# Patient Record
Sex: Female | Born: 1960 | Race: Black or African American | Hispanic: No | Marital: Single | Smoking: Current every day smoker
Health system: Southern US, Community
[De-identification: ages and names within clinical notes are randomized; demographics above are authoritative.]

## PROBLEM LIST (undated history)

## (undated) DIAGNOSIS — K56609 Unspecified intestinal obstruction, unspecified as to partial versus complete obstruction: Secondary | ICD-10-CM

## (undated) DIAGNOSIS — J449 Chronic obstructive pulmonary disease, unspecified: Secondary | ICD-10-CM

## (undated) DIAGNOSIS — F329 Major depressive disorder, single episode, unspecified: Secondary | ICD-10-CM

## (undated) DIAGNOSIS — M1990 Unspecified osteoarthritis, unspecified site: Secondary | ICD-10-CM

## (undated) DIAGNOSIS — G8929 Other chronic pain: Secondary | ICD-10-CM

## (undated) DIAGNOSIS — I10 Essential (primary) hypertension: Secondary | ICD-10-CM

## (undated) DIAGNOSIS — K219 Gastro-esophageal reflux disease without esophagitis: Secondary | ICD-10-CM

## (undated) DIAGNOSIS — F419 Anxiety disorder, unspecified: Secondary | ICD-10-CM

## (undated) DIAGNOSIS — F32A Depression, unspecified: Secondary | ICD-10-CM

## (undated) DIAGNOSIS — I639 Cerebral infarction, unspecified: Secondary | ICD-10-CM

## (undated) HISTORY — DX: Unspecified intestinal obstruction, unspecified as to partial versus complete obstruction: K56.609

## (undated) HISTORY — PX: OTHER SURGICAL HISTORY: SHX169

## (undated) HISTORY — DX: Gastro-esophageal reflux disease without esophagitis: K21.9

## (undated) HISTORY — DX: Cerebral infarction, unspecified: I63.9

---

## 2000-11-25 ENCOUNTER — Encounter: Payer: Self-pay | Admitting: Emergency Medicine

## 2000-11-25 ENCOUNTER — Emergency Department (HOSPITAL_COMMUNITY): Admission: EM | Admit: 2000-11-25 | Discharge: 2000-11-25 | Payer: Self-pay | Admitting: Emergency Medicine

## 2001-08-18 HISTORY — PX: ABDOMINAL SURGERY: SHX537

## 2002-10-27 ENCOUNTER — Observation Stay (HOSPITAL_COMMUNITY): Admission: EM | Admit: 2002-10-27 | Discharge: 2002-10-27 | Payer: Self-pay | Admitting: *Deleted

## 2003-02-16 ENCOUNTER — Inpatient Hospital Stay (HOSPITAL_COMMUNITY): Admission: AD | Admit: 2003-02-16 | Discharge: 2003-02-19 | Payer: Self-pay | Admitting: Obstetrics and Gynecology

## 2003-03-27 ENCOUNTER — Ambulatory Visit (HOSPITAL_COMMUNITY): Admission: RE | Admit: 2003-03-27 | Discharge: 2003-03-27 | Payer: Self-pay | Admitting: Obstetrics and Gynecology

## 2006-02-15 ENCOUNTER — Emergency Department (HOSPITAL_COMMUNITY): Admission: EM | Admit: 2006-02-15 | Discharge: 2006-02-15 | Payer: Self-pay | Admitting: Emergency Medicine

## 2006-05-12 ENCOUNTER — Encounter: Admission: RE | Admit: 2006-05-12 | Discharge: 2006-05-12 | Payer: Self-pay | Admitting: Occupational Medicine

## 2006-09-23 ENCOUNTER — Ambulatory Visit (HOSPITAL_COMMUNITY): Admission: RE | Admit: 2006-09-23 | Discharge: 2006-09-23 | Payer: Self-pay | Admitting: Obstetrics & Gynecology

## 2006-10-07 ENCOUNTER — Ambulatory Visit (HOSPITAL_COMMUNITY): Admission: RE | Admit: 2006-10-07 | Discharge: 2006-10-07 | Payer: Self-pay | Admitting: Obstetrics & Gynecology

## 2006-10-07 ENCOUNTER — Encounter (INDEPENDENT_AMBULATORY_CARE_PROVIDER_SITE_OTHER): Payer: Self-pay | Admitting: Specialist

## 2006-11-14 ENCOUNTER — Emergency Department (HOSPITAL_COMMUNITY): Admission: EM | Admit: 2006-11-14 | Discharge: 2006-11-14 | Payer: Self-pay | Admitting: Emergency Medicine

## 2006-11-24 ENCOUNTER — Encounter (HOSPITAL_COMMUNITY): Admission: RE | Admit: 2006-11-24 | Discharge: 2006-12-24 | Payer: Self-pay | Admitting: Orthopaedic Surgery

## 2008-05-29 ENCOUNTER — Emergency Department (HOSPITAL_COMMUNITY): Admission: EM | Admit: 2008-05-29 | Discharge: 2008-05-29 | Payer: Self-pay | Admitting: Emergency Medicine

## 2010-09-09 ENCOUNTER — Encounter: Payer: Self-pay | Admitting: Interventional Radiology

## 2010-10-24 ENCOUNTER — Emergency Department (HOSPITAL_COMMUNITY): Payer: Self-pay

## 2010-10-24 ENCOUNTER — Emergency Department (HOSPITAL_COMMUNITY)
Admission: EM | Admit: 2010-10-24 | Discharge: 2010-10-24 | Disposition: A | Discharge status: Home / Self Care | Payer: Self-pay | Attending: Emergency Medicine | Admitting: Emergency Medicine

## 2010-10-24 DIAGNOSIS — R05 Cough: Secondary | ICD-10-CM | POA: Insufficient documentation

## 2010-10-24 DIAGNOSIS — R0602 Shortness of breath: Secondary | ICD-10-CM | POA: Insufficient documentation

## 2010-10-24 DIAGNOSIS — I10 Essential (primary) hypertension: Secondary | ICD-10-CM | POA: Insufficient documentation

## 2010-10-24 DIAGNOSIS — R0789 Other chest pain: Secondary | ICD-10-CM | POA: Insufficient documentation

## 2010-10-24 DIAGNOSIS — R059 Cough, unspecified: Secondary | ICD-10-CM | POA: Insufficient documentation

## 2010-10-24 DIAGNOSIS — J45901 Unspecified asthma with (acute) exacerbation: Secondary | ICD-10-CM | POA: Insufficient documentation

## 2010-10-24 DIAGNOSIS — F17200 Nicotine dependence, unspecified, uncomplicated: Secondary | ICD-10-CM | POA: Insufficient documentation

## 2010-11-07 ENCOUNTER — Other Ambulatory Visit (HOSPITAL_COMMUNITY): Payer: Self-pay | Admitting: Family Medicine

## 2010-11-07 DIAGNOSIS — Z139 Encounter for screening, unspecified: Secondary | ICD-10-CM

## 2010-11-12 ENCOUNTER — Ambulatory Visit (HOSPITAL_COMMUNITY)
Admission: RE | Admit: 2010-11-12 | Discharge: 2010-11-12 | Disposition: A | Discharge status: Home / Self Care | Payer: PRIVATE HEALTH INSURANCE | Source: Ambulatory Visit | Attending: Family Medicine | Admitting: Family Medicine

## 2010-11-12 DIAGNOSIS — Z139 Encounter for screening, unspecified: Secondary | ICD-10-CM

## 2010-11-12 DIAGNOSIS — Z1231 Encounter for screening mammogram for malignant neoplasm of breast: Secondary | ICD-10-CM | POA: Insufficient documentation

## 2011-01-03 NOTE — Op Note (Signed)
   NAME:  Judy Boyd, Judy Boyd                       ACCOUNT NO.:  [id number]   MEDICAL RECORD NO.:  [id number]                   PATIENT TYPE:  INP   LOCATION:  A427                                 FACILITY:  [facility]   PHYSICIAN:  Tilda Burrow, M.D.              DATE OF BIRTH:  1960-09-03   DATE OF PROCEDURE:  DATE OF DISCHARGE:                                 OPERATIVE REPORT   DELIVERY NOTE:  [name] desired something for pain and complained of pressure  at approximately 8:20, where she was noted to be fully dilated at +2  station.  Pain medicine was deferred and she was instructed to push.  After  a very brief second stage, she delivered a viable female infant at 12.  The baby was expelled through one contraction, so there was no time for  suctioning.  Apgars are 9 and 9, weight 3 pounds 14 ounces.  Twenty units of  Pitocin diluted in 1000 mL of lactated Ringer's was then infused rapidly IV.  The placenta separated spontaneously and was delivered via controlled cord  traction and maternal pushing effort at approximately 0835.  The fundus was  immediately firm, and there was little blood loss noted.  A first degree  perineal laceration was noted and was not repaired.  Estimated blood loss  250 mL.     [name], C.N.M.          Tilda Burrow, M.D.    FC/MEDQ  D:  02/17/2003  T:  02/18/2003  Job:  [id number]

## 2011-01-03 NOTE — H&P (Signed)
NAME:  Judy Boyd, Judy Boyd                       ACCOUNT NO.:  [id number]   MEDICAL RECORD NO.:  [id number]                   PATIENT TYPE:  OIB   LOCATION:  LDR1                                 FACILITY:  [facility]   PHYSICIAN:  Tilda Burrow, M.D.              DATE OF BIRTH:  Mar 25, 1961   DATE OF ADMISSION:  02/16/2003  DATE OF DISCHARGE:                                HISTORY & PHYSICAL   CHIEF COMPLAINT:  Spontaneous rupture of membranes at 1900.   HISTORY OF PRESENT ILLNESS:  Judy Boyd is a 50 year old gravida 3, para 1, AB  1, living 1 with an EDC of 03/27/2003 based on known last menstrual period and  correlating first and second trimester ultrasounds, placing her at 34-3/[redacted]  weeks gestation.  She began prenatal care early in the first trimester and  has had regular visits.  Prenatal course has been uneventful.  Total weight  gain approximately 15 pounds with appropriate fundal height growth.  Prenatal blood pressure are 110s to 130s/70s to 80s.   PRENATAL LABORATORY DATA:  Blood type O positive, rubella immune.  HBsAg,  HIV, RPR, gonorrhea, Chlamydia all negative.  One-hour GTT was normal at 67.  She declined an amniocentesis, but the MS ASP was normal.   PAST MEDICAL HISTORY:  Noncontributory.   PAST SURGICAL HISTORY:  She had an ectopic pregnancy about 1992 and a bowel  obstruction approximately 52.  No history of blood transfusions or  anesthesia complications.   ALLERGIES:  No known drug allergies.   SOCIAL HISTORY:  She is single.  The father of the baby and her family are  supportive.  She smokes two to three cigarettes a day, denies alcohol and  drug use.   PAST OBSTETRICAL HISTORY:  In 1989, she had a female 6 pound 5 ounce at  approximately 40 weeks for which she said was a 45-minute labor.  In 1992,  the ectopic as described before.   PHYSICAL EXAMINATION:  HEENT:  Within normal limits.  HEART:  Regular.  LUNGS:  Clear.  ABDOMEN:  Soft and nontender.  There are  no contractions at this time.  PELVIC:  Cervix is 3, 70%, -2, very posterior, vertex per RN.  Gross rupture  of membranes with clear amniotic fluid.  EXTREMITIES:  Legs negative.   IMPRESSION:  1. Intrauterine pregnancy at 34-3/[redacted] weeks gestation.  2. Preterm premature rupture of membranes.   PLAN:  Plan of care was discussed with both Dr. Emelda Fear and the attending  pediatrician, Dr. Milford Cage.  We will provide GBS prophylaxis until delivery.  If  patient is not in labor by the morning, we will begin Pitocin augmentation.      [name], C.N.M.          Tilda Burrow, M.D.    FC/MEDQ  D:  02/16/2003  T:  02/16/2003  Job:  [id number]   cc:   [facility]

## 2011-01-03 NOTE — H&P (Signed)
   NAME:  Judy Boyd, Judy Boyd                       ACCOUNT NO.:  [id number]   MEDICAL RECORD NO.:  [id number]                   PATIENT TYPE:  AMB   LOCATION:  DAY                                  FACILITY:  [facility]   PHYSICIAN:  Tilda Burrow, M.D.              DATE OF BIRTH:  Jul 08, 1961   DATE OF ADMISSION:  DATE OF DISCHARGE:                                HISTORY & PHYSICAL   ADMISSION DIAGNOSIS:  Desire for elective permanent sterilization.   HISTORY OF PRESENT ILLNESS:  This 50 year old female, gravida 3, para 2, AB1  with two living children, most recently status post delivery at 34-1/[redacted] weeks  gestation of a small for gestational age infant.  She is now admitted one  month later for elective permanent sterilization.  She understands the  permanency of the requested procedure with failure rate 1:100 quoted the  patient.  She was seen for postpartum visit on March 01, 2003 and confirms  again her desire for permanent sterilization.  She has previously signed  Medicaid consents on March 08, 2003.  Plans are to proceed with sterilization  as soon as possible.   PAST MEDICAL HISTORY:  Benign.   PAST SURGICAL HISTORY:  Laparoscopic for ectopic in 1992, bowel obstruction,  Dr. Malvin Johns, 1990.   SOCIAL HISTORY/HABITS:  Cigarettes two to three per day.  Alcohol,  recreational drugs denied.  The patient is single, allegedly supportive  father though he is not present during the birth of the child or subsequent  care.  The patient at times shows limited parenting interests.   PHYSICAL EXAMINATION:  VITAL SIGNS:  Height 5 feet 6 inches, weight 135.  Blood pressure 126/62.  Hemoglobin 13.3.  HEENT:  Pupils equal, round, reactive to light.  NECK:  Supple.  Trachea midline.  LUNGS:  Clear to auscultation.  HEART:  Regular rate and rhythm without murmurs.  GENITOURINARY:  External genitalia normal.  Vaginal examination with normal  secretions.  Cervix anteflexed, involute, uterus  involuting nicely.  Adnexa  negative for masses.  First degree perineal laceration well healed.   PLAN:  Laparoscopic tubal sterilization Falope rings March 09, 2003.  Patient  is aware that tubal cautery is at times  necessary in early postpartum  sterilizations.                                               Tilda Burrow, M.D.    JVF/MEDQ  D:  03/07/2003  T:  03/07/2003  Job:  [id number]

## 2011-01-03 NOTE — Op Note (Signed)
NAME:  Judy Boyd, Judy Boyd                       ACCOUNT NO.:  [id number]   MEDICAL RECORD NO.:  [id number]                   PATIENT TYPE:  AMB   LOCATION:  DAY                                  FACILITY:  [facility]   PHYSICIAN:  Tilda Burrow, M.D.              DATE OF BIRTH:  07-12-1961   DATE OF PROCEDURE:  DATE OF DISCHARGE:                                 OPERATIVE REPORT   PREOPERATIVE DIAGNOSIS:  Elective sterilization.   POSTOPERATIVE DIAGNOSIS:  Elective sterilization, pelvis adhesions.   PROCEDURE:  Laparoscopic left tubal cautery, laparoscopic right Falope ring  application, lysis of adhesions.   SURGEON:  Tilda Burrow, M.D.   ANESTHESIA:  General.   COMPLICATIONS:  None.   FINDINGS:  Thin filmy adhesions from omentum to anterior abdominal wall from  below the umbilicus to the pelvic brim, partially view but allowing adequate  view to continue the procedure laparoscopically.   DESCRIPTION OF PROCEDURE:  The patient was taken to the operating room and  prepped and draped for combined abdominovaginal procedure with in-and-out  catheterization of the bladder, Hulka thyroid tenaculum attached to the  cervix, and abdomen prepped and draped.   A supraumbilical 1-cm skin incision was made and Veress needle used to  introduce pneumoperitoneum into the abdomen.  Due to the prior midline  vertical incision for lysis of adhesions, we decided to use an open Hasson  cannula technique of peritoneal opening, so we proceeded with extending the  suprapubic incision to 2-cm, grasping the fascia with Allis clamps, and  carefully entering the peritoneal cavity bluntly with placement of a Hasson  laparoscopic 10-mm cannula and insertion of the camera.  There were lots of  thin filmy adhesions, but we could wind our way through them to visualize  the pelvis.  A suprapubic trocar was inserted under direct visualization,  first using the #8 laparoscopic trocar for the Falope ring  applier.  There  was a technical malfunction, so it was replaced with a 5-mm trocar through  which the surgical scissors were used to dissect enough of the adhesions to  give [place] adequate visualization.  We also proceeded to do a unipolar cautery  closure of the left tube, with unipolar cautery dessication of a 2-cm  portion of the left tube followed by scissor transection of the tube through  the midportion of the desiccated area.  Hemostasis was confirmed.  We then  proceeded to the opposite side which was more technically challenging due to  adhesion visibility.  We decided to use the Falope ring applier rather than  attempt cautery in this area so the Falope ring could be applied to the  midportion of the tube in standard fashion.  The tube was infiltrated with a  dilute Marcaine solution as per protocol, and then we proceeded with closure  of the procedure  by closure of the fascia using interrupted 0 Vicryl after irrigation of  the  abdomen with generous saline solution.  We then proceeded to close the  subcutaneous tissues with 2-0 plain and staple closure of the skin.   The patient tolerated the procedure well and went to the recovery room in  good condition.                                               Tilda Burrow, M.D.    JVF/MEDQ  D:  03/27/2003  T:  03/27/2003  Job:  [id number]

## 2011-01-03 NOTE — Op Note (Signed)
Judy Boyd, Judy Boyd             ACCOUNT NO.:  [id number]   MEDICAL RECORD NO.:  [id number]          PATIENT TYPE:  AMB   LOCATION:  DAY                           FACILITY:  [facility]   PHYSICIAN:  Lazaro Arms, M.D.   DATE OF BIRTH:  25-Dec-1960   DATE OF PROCEDURE:  10/07/2006  DATE OF DISCHARGE:                               OPERATIVE REPORT   PREOPERATIVE DIAGNOSES:  1. Metromenorrhagia.  2. Dysmenorrhea.   POSTOPERATIVE DIAGNOSES:  1. Metromenorrhagia.  2. Dysmenorrhea.   OPERATION PERFORMED:  Hysteroscopy, dilation and curettage, endometrial  ablation.   SURGEON:  Lazaro Arms, M.D.   ANESTHESIA:  General endotracheal.   FINDINGS:  The patient is status post Megace for the last few months.  Her endometrial cavity is smooth, no polyps noted, no submucosal myomas,  no abnormalities.   DESCRIPTION OF PROCEDURE:  The patient was taken to the operating room  and placed in supine position.  She underwent general endotracheal  anesthesia.  She is placed in dorsal lithotomy position, prepped and  draped in the usual sterile fashion.  The Graves speculum was placed,  cervix was grasped.  Cervix was dilated serially to allow passage of the  hysteroscope.  Diagnostic hysteroscopy was performed and found to be  completely normal.  A vigorous uterine curettage was performed and all  tissue was removed.  There were no abnormalities.  The ThermaChoice 3  endometrial ablation balloon was then used, 13 mL of D5W was required to  maintain a pressure of 190 to 200 mmHg, throughout the procedure, was  heated to 87 degrees Celsius.  Total therapy time was 9 minutes and 9  seconds.  All the fluid was returned at the end of the procedure.  The  patient tolerated the procedure well.  She experienced minimal blood  loss and was taken to recovery room in good and stable condition.  All  counts were correct.      Lazaro Arms, M.D.  Electronically Signed     LHE/MEDQ  D:  10/07/2006   T:  10/07/2006  Job:  [id number]

## 2011-07-17 ENCOUNTER — Emergency Department (HOSPITAL_COMMUNITY): Payer: Self-pay

## 2011-07-17 ENCOUNTER — Encounter: Payer: Self-pay | Admitting: *Deleted

## 2011-07-17 ENCOUNTER — Emergency Department (HOSPITAL_COMMUNITY)
Admission: EM | Admit: 2011-07-17 | Discharge: 2011-07-17 | Disposition: A | Discharge status: Home / Self Care | Payer: Self-pay | Attending: Emergency Medicine | Admitting: Emergency Medicine

## 2011-07-17 DIAGNOSIS — S92403A Displaced unspecified fracture of unspecified great toe, initial encounter for closed fracture: Secondary | ICD-10-CM

## 2011-07-17 DIAGNOSIS — S92919A Unspecified fracture of unspecified toe(s), initial encounter for closed fracture: Secondary | ICD-10-CM | POA: Insufficient documentation

## 2011-07-17 DIAGNOSIS — F17200 Nicotine dependence, unspecified, uncomplicated: Secondary | ICD-10-CM | POA: Insufficient documentation

## 2011-07-17 DIAGNOSIS — I10 Essential (primary) hypertension: Secondary | ICD-10-CM | POA: Insufficient documentation

## 2011-07-17 DIAGNOSIS — IMO0002 Reserved for concepts with insufficient information to code with codable children: Secondary | ICD-10-CM | POA: Insufficient documentation

## 2011-07-17 DIAGNOSIS — J45909 Unspecified asthma, uncomplicated: Secondary | ICD-10-CM | POA: Insufficient documentation

## 2011-07-17 HISTORY — DX: Essential (primary) hypertension: I10

## 2011-07-17 MED ORDER — HYDROCODONE-ACETAMINOPHEN 5-325 MG PO TABS: ORAL_TABLET | ORAL | Status: AC

## 2011-07-17 NOTE — ED Provider Notes (Signed)
History     CSN: [id number] Arrival date & time: 07/17/2011  9:14 AM   First MD Initiated Contact with Patient 07/17/11 0915      Chief Complaint  Patient presents with  . Toe Injury    (Consider location/radiation/quality/duration/timing/severity/associated sxs/prior treatment) HPI Comments: Patient c/o pain and persistent swelling of the right great toe for 3 weeks.  Pain began after she struck her foot on a basket of clothes. Reports having bruising to the toe at onset of the injury.   Denies numbness, discoloration or other injuries.    Patient is a 50 y.o. female presenting with toe pain. The history is provided by the patient.  Toe Pain This is a chronic problem. The current episode started 1 to 4 weeks ago. The problem occurs constantly. The problem has been unchanged. Associated symptoms include arthralgias and joint swelling. Pertinent negatives include no chills, coughing, fatigue, fever, myalgias, neck pain, numbness, rash, sore throat or weakness. The symptoms are aggravated by walking and standing (palpation). She has tried ice for the symptoms. The treatment provided no relief.    Past Medical History  Diagnosis Date  . Hypertension   . Asthma     Past Surgical History  Procedure Date  . Abdominal surgery     History reviewed. No pertinent family history.  History  Substance Use Topics  . Smoking status: Current Everyday Smoker -- 0.5 packs/day    Types: Cigarettes  . Smokeless tobacco: Not on file  . Alcohol Use: No    OB History    Grav Para Term Preterm Abortions TAB SAB Ect Mult Living                  Review of Systems  Constitutional: Negative for fever, chills and fatigue.  HENT: Negative for sore throat, trouble swallowing, neck pain and neck stiffness.   Respiratory: Negative for cough, shortness of breath and wheezing.   Musculoskeletal: Positive for joint swelling and arthralgias. Negative for myalgias and back pain.  Skin: Negative.   Negative for rash.  Neurological: Negative for weakness and numbness.  Hematological: Does not bruise/bleed easily.  All other systems reviewed and are negative.    Allergies  Review of patient's allergies indicates no known allergies.  Home Medications  No current outpatient prescriptions on file.  BP 157/99  Pulse 81  Temp(Src) 98.5 F (36.9 C) (Oral)  Resp 16  Ht 5\' 6"  (1.676 m)  Wt 120 lb (54.432 kg)  BMI 19.37 kg/m2  SpO2 100%  Physical Exam  Nursing note and vitals reviewed. Constitutional: She is oriented to person, place, and time. She appears well-developed and well-nourished. No distress.  HENT:  Head: Normocephalic and atraumatic.  Cardiovascular: Normal rate, regular rhythm and normal heart sounds.   Pulmonary/Chest: Effort normal and breath sounds normal. No respiratory distress. She exhibits no tenderness.  Musculoskeletal: Normal range of motion. She exhibits edema and tenderness.       Feet:       ttp of the entire right great toe. No bruising, abrasions or erythema.  Mild STS.  Cr<2 sec, sensation intact, DP pulse is brisk  Neurological: She is alert and oriented to person, place, and time. No cranial nerve deficit. She exhibits normal muscle tone. Coordination normal.  Skin: Skin is warm and dry.  Psychiatric: She has a normal mood and affect.    ED Course  Procedures (including critical care time)   Dg Toe Great Right  07/17/2011  *RADIOLOGY REPORT*  Clinical  Data: Pain and swelling great toe, injury 3 weeks ago  RIGHT TOE - 2+ VIEW  Comparison: None  Findings: Minimal narrowing first MTP joint. Mild soft tissue swelling medial aspect of first metatarsal head. Slight osseous demineralization. Questionable nondisplaced fracture at medial aspect, base of distal phalanx great toe versus artifact related to spurring. No additional fracture, dislocation or bone destruction.  IMPRESSION: Questionable nondisplaced fracture versus artifact at medial aspect,  base of distal phalanx right great toe. No additional potentially acute bony abnormalities identified.  Original Report Authenticated By: Lollie Marrow, M.D.     MDM   Today's x-ray shows likely non-displaced fx of the toe, will buddy tape the toe and apply post-op shoe.  Pt agrees to f/u with Dr. Hilda Lias.  Pt to elevate , ice and will prescribe pain med         Solan Vosler L. [place], [place] 07/17/11 1123

## 2011-07-17 NOTE — ED Notes (Signed)
Pt c/o swelling and pain in her right great toe since hitting it on a clothes basket 3 weeks ago.

## 2011-07-17 NOTE — ED Provider Notes (Signed)
Medical screening examination/treatment/procedure(s) were performed by non-physician practitioner and as supervising physician I was immediately available for consultation/collaboration.   Cristian Grieves, MD 07/17/11 1355 

## 2011-09-09 ENCOUNTER — Emergency Department (HOSPITAL_COMMUNITY)
Admission: EM | Admit: 2011-09-09 | Discharge: 2011-09-09 | Disposition: A | Discharge status: Home / Self Care | Payer: Medicaid Other | Attending: Emergency Medicine | Admitting: Emergency Medicine

## 2011-09-09 ENCOUNTER — Other Ambulatory Visit: Payer: Self-pay

## 2011-09-09 ENCOUNTER — Emergency Department (HOSPITAL_COMMUNITY): Payer: Medicaid Other

## 2011-09-09 ENCOUNTER — Encounter (HOSPITAL_COMMUNITY): Payer: Self-pay | Admitting: Emergency Medicine

## 2011-09-09 DIAGNOSIS — J45909 Unspecified asthma, uncomplicated: Secondary | ICD-10-CM | POA: Insufficient documentation

## 2011-09-09 DIAGNOSIS — R0789 Other chest pain: Secondary | ICD-10-CM

## 2011-09-09 DIAGNOSIS — I10 Essential (primary) hypertension: Secondary | ICD-10-CM | POA: Insufficient documentation

## 2011-09-09 DIAGNOSIS — R071 Chest pain on breathing: Secondary | ICD-10-CM | POA: Insufficient documentation

## 2011-09-09 DIAGNOSIS — F17200 Nicotine dependence, unspecified, uncomplicated: Secondary | ICD-10-CM | POA: Insufficient documentation

## 2011-09-09 MED ORDER — HYDROCODONE-ACETAMINOPHEN 5-500 MG PO TABS: 1.0000 | ORAL_TABLET | Freq: Four times a day (QID) | ORAL | Status: AC | PRN

## 2011-09-09 MED ORDER — IBUPROFEN 600 MG PO TABS: 600.0000 mg | ORAL_TABLET | Freq: Four times a day (QID) | ORAL | Status: AC | PRN

## 2011-09-09 NOTE — ED Notes (Signed)
Pt c/o chest pain x 2 days, pt states the pain gets worse with movement and breathing in and out. Pt states she is dizzy at times and was nauseated Saturday.

## 2011-09-09 NOTE — ED Provider Notes (Signed)
History    This chart was scribed for Nelia Shi, MD, MD by Smitty Pluck. The patient was seen in room APA15 and the patient's care was started at 8:16AM.   CSN: [id number]  Arrival date & time 09/09/11  0801   First MD Initiated Contact with Patient 09/09/11 [phone]      Chief Complaint  Patient presents with  . Chest Pain  . Nausea     Patient is a 51 y.o. female presenting with chest pain. The history is provided by the patient.  Chest Pain    PIERRE CUMPTON is a 51 y.o. female who presents to the Emergency Department complaining of moderate chest pain onset 2 days. Pain is aggravated by breathing shallowly and exertion. She reports pulling muscle. Symptoms have been constant since onset without radiation. Pt has taken cold and flu medication without relief. Pt reports having sick contact. Pt reports she is a smoker and usually has a cough. She denies fever, blood clots and using birth control pills. Pt has a history of asthma.  Past Medical History  Diagnosis Date  . Hypertension   . Asthma     Past Surgical History  Procedure Date  . Abdominal surgery     History reviewed. No pertinent family history.  History  Substance Use Topics  . Smoking status: Current Everyday Smoker -- 0.5 packs/day    Types: Cigarettes  . Smokeless tobacco: Not on file  . Alcohol Use: No    OB History    Grav Para Term Preterm Abortions TAB SAB Ect Mult Living                  Review of Systems  Cardiovascular: Positive for chest pain.  All other systems reviewed and are negative.   10 Systems reviewed and are negative for acute change except as noted in the HPI.  Allergies  Review of patient's allergies indicates no known allergies.  Home Medications   Current Outpatient Rx  Name Route Sig Dispense Refill  . ALBUTEROL SULFATE HFA 108 (90 BASE) MCG/ACT IN AERS Inhalation Inhale 2 puffs into the lungs every 6 (six) hours as needed.      . ALPRAZOLAM 1 MG PO TABS Oral  Take 1 mg by mouth 3 (three) times daily as needed. anxiety     . AMLODIPINE BESYLATE 10 MG PO TABS Oral Take 10 mg by mouth daily.      . BUDESONIDE-FORMOTEROL FUMARATE 160-4.5 MCG/ACT IN AERO Inhalation Inhale 2 puffs into the lungs 2 (two) times daily.      Marland Kitchen ESTRADIOL 2 MG PO TABS Oral Take 2 mg by mouth daily.      Marland Kitchen FLUOXETINE HCL 20 MG PO CAPS Oral Take 20 mg by mouth daily.      Marland Kitchen HYDROCODONE-ACETAMINOPHEN 5-500 MG PO TABS Oral Take 1-2 tablets by mouth every 6 (six) hours as needed for pain. 15 tablet 0  . IBUPROFEN 600 MG PO TABS Oral Take 1 tablet (600 mg total) by mouth every 6 (six) hours as needed for pain. 30 tablet 0  . LISINOPRIL 20 MG PO TABS Oral Take 20 mg by mouth daily.      Marland Kitchen MONTELUKAST SODIUM 10 MG PO TABS Oral Take 10 mg by mouth at bedtime.        BP 125/92  Pulse 86  Temp(Src) 98 F (36.7 C) (Oral)  Resp 20  Ht 5\' 6"  (1.676 m)  Wt 125 lb (56.7 kg)  BMI 20.18  kg/m2  SpO2 100%  Physical Exam  Nursing note and vitals reviewed. Constitutional: She appears well-developed and well-nourished.  HENT:  Head: Normocephalic and atraumatic.  Right Ear: External ear normal.  Left Ear: External ear normal.  Eyes: EOM are normal. Pupils are equal, round, and reactive to light.  Neck: Normal range of motion. Neck supple. No thyromegaly present.  Cardiovascular: Normal rate, regular rhythm and normal heart sounds.        EKG Cardiovascular:         Date: 09/09/2011  Rate: 72  Rhythm: normal sinus rhythm  QRS Axis: normal  Intervals: normal  ST/T Wave abnormalities: normal  Conduction Disutrbances:none:   Old EKG Reviewed: none available       Pulmonary/Chest: Effort normal and breath sounds normal. No respiratory distress.  Abdominal: Soft. Bowel sounds are normal. She exhibits no distension and no mass. There is no tenderness.  Musculoskeletal: She exhibits tenderness.       Tenderness in right costochondral area reproducible upon palpation.     Neurological: She is alert. She has normal reflexes.  Skin: Skin is warm and dry.  Psychiatric: She has a normal mood and affect. Her behavior is normal.    ED Course  Procedures (including critical care time)  DIAGNOSTIC STUDIES: Oxygen Saturation is 100% on room air, normal by my interpretation.    COORDINATION OF CARE: 8:21 AM :EDP Ordered Chest XRAY    Labs Reviewed - No data to display Dg Chest 2 View  09/09/2011  *RADIOLOGY REPORT*  Clinical Data: Pleuritic chest pain  CHEST - 2 VIEW  Comparison:  10/24/2010  Findings:  The heart size and mediastinal contours are within normal limits.  Both lungs are clear.  The visualized skeletal structures are unremarkable.  IMPRESSION: No active cardiopulmonary disease.  Original Report Authenticated By: Judie Petit. Ruel Favors, M.D.     1. Chest wall pain       MDM   Patient is PERC (-)    I personally performed the services described in this documentation, which was scribed in my presence. The recorded information has been reviewed and considered.    Nelia Shi, MD 09/09/11 [phone]

## 2011-11-20 ENCOUNTER — Encounter (HOSPITAL_COMMUNITY): Payer: Self-pay | Admitting: Emergency Medicine

## 2011-11-20 ENCOUNTER — Emergency Department (HOSPITAL_COMMUNITY)
Admission: EM | Admit: 2011-11-20 | Discharge: 2011-11-20 | Disposition: A | Discharge status: Home / Self Care | Payer: Medicaid Other | Attending: Emergency Medicine | Admitting: Emergency Medicine

## 2011-11-20 ENCOUNTER — Emergency Department (HOSPITAL_COMMUNITY): Payer: Medicaid Other

## 2011-11-20 DIAGNOSIS — I10 Essential (primary) hypertension: Secondary | ICD-10-CM | POA: Insufficient documentation

## 2011-11-20 DIAGNOSIS — F17200 Nicotine dependence, unspecified, uncomplicated: Secondary | ICD-10-CM | POA: Insufficient documentation

## 2011-11-20 DIAGNOSIS — J45909 Unspecified asthma, uncomplicated: Secondary | ICD-10-CM | POA: Insufficient documentation

## 2011-11-20 LAB — POCT I-STAT, CHEM 8
Calcium, Ion: 1.29 mmol/L (ref 1.12–1.32)
Chloride: 106 mEq/L (ref 96–112)
HCT: 42 % (ref 36.0–46.0)
Potassium: 3.8 mEq/L (ref 3.5–5.1)
Sodium: 140 mEq/L (ref 135–145)

## 2011-11-20 MED ORDER — LISINOPRIL 40 MG PO TABS
40.0000 mg | ORAL_TABLET | Freq: Once | ORAL | Status: AC
  Administered 2011-11-20: 40 mg via ORAL
  Filled 2011-11-20: qty 1

## 2011-11-20 NOTE — ED Provider Notes (Addendum)
History     CSN: [id number]  Arrival date & time 11/20/11  6578   First MD Initiated Contact with Patient 11/20/11 [id number]      Hypertension  (Consider location/radiation/quality/duration/timing/severity/associated sxs/prior treatment) HPI  Patient states she is here from urgent care and was told to come here for hypertension.  BP was 218/118 at evaluation for disability.  Patient denies chest pain, headache, or dyspnea.  Patient states she has not missed any of her medications but has not taken her am dose of lisinopril.  She is followed at [facility].   Past Medical History  Diagnosis Date  . Hypertension   . Asthma     Past Surgical History  Procedure Date  . Abdominal surgery     No family history on file.  History  Substance Use Topics  . Smoking status: Current Everyday Smoker -- 0.5 packs/day    Types: Cigarettes  . Smokeless tobacco: Not on file  . Alcohol Use: No    OB History    Grav Para Term Preterm Abortions TAB SAB Ect Mult Living                  Review of Systems  All other systems reviewed and are negative.    Allergies  Review of patient's allergies indicates no known allergies.  Home Medications   Current Outpatient Rx  Name Route Sig Dispense Refill  . ALBUTEROL SULFATE HFA 108 (90 BASE) MCG/ACT IN AERS Inhalation Inhale 2 puffs into the lungs every 6 (six) hours as needed.      . ALPRAZOLAM 1 MG PO TABS Oral Take 1 mg by mouth 3 (three) times daily as needed. anxiety     . AMLODIPINE BESYLATE 10 MG PO TABS Oral Take 10 mg by mouth daily.      . BUDESONIDE-FORMOTEROL FUMARATE 160-4.5 MCG/ACT IN AERO Inhalation Inhale 2 puffs into the lungs 2 (two) times daily.      Marland Kitchen ESTRADIOL 2 MG PO TABS Oral Take 2 mg by mouth daily.      Marland Kitchen FLUOXETINE HCL 20 MG PO CAPS Oral Take 20 mg by mouth daily.      Marland Kitchen LISINOPRIL 20 MG PO TABS Oral Take 20 mg by mouth daily.      Marland Kitchen MONTELUKAST SODIUM 10 MG PO TABS Oral Take 10 mg by mouth at bedtime.          There were no vitals taken for this visit.  Physical Exam  Nursing note and vitals reviewed. Constitutional: She appears well-developed and well-nourished.  HENT:  Head: Normocephalic and atraumatic.  Eyes: Conjunctivae and EOM are normal. Pupils are equal, round, and reactive to light.  Neck: Normal range of motion. Neck supple.  Cardiovascular: Normal rate, regular rhythm, normal heart sounds and intact distal pulses.   Pulmonary/Chest: Effort normal and breath sounds normal.  Abdominal: Soft. Bowel sounds are normal.  Musculoskeletal: Normal range of motion.  Neurological: She is alert.  Skin: Skin is warm and dry.  Psychiatric: She has a normal mood and affect. Thought content normal.    ED Course  Procedures (including critical care time)  Labs Reviewed - No data to display No results found.   No diagnosis found.    MDM  Patient with known history of hypertension. She's not having any symptomatology of acute or tensive urgency. She had not taken her a.m. medicines and is given 20 mg of lisinopril here. Blood pressure is now 150. I-STAT is done and shows  no evidence of renal insufficiency. Chest x-Dierdre Mccalip without congestive heart failure. Patient woke up with her primary care Dr. She is discharged to home.        Hilario Quarry, MD 11/24/11 1348  Hilario Quarry, MD 11/25/11 [phone]

## 2011-11-20 NOTE — ED Notes (Signed)
Patient transported to CT 

## 2011-11-20 NOTE — Discharge Instructions (Signed)
Hypertension       Hypertension is another name for high blood pressure. High blood pressure may mean that your heart needs to work harder to pump blood. Blood pressure consists of two numbers, which includes a higher number over a lower number (example: 110/72).     HOME CARE     Make lifestyle changes as told by your doctor. This may include weight loss and exercise.   Take your blood pressure medicine every day.   Limit how much salt you use.   Stop smoking if you smoke.   Do not use drugs.   Talk to your doctor if you are using decongestants or birth control pills. These medicines might make blood pressure higher.   Females should not drink more than 1 alcoholic drink per day. Males should not drink more than 2 alcoholic drinks per day.   See your doctor as told.    GET HELP RIGHT AWAY IF:     You have a blood pressure reading with a top number of 180 or higher.   You get a very bad headache.   You get blurred or changing vision.   You feel confused.   You feel weak, numb, or faint.   You get chest or belly (abdominal) pain.   You throw up (vomit).   You cannot breathe very well.    MAKE SURE YOU:     Understand these instructions.   Will watch your condition.   Will get help right away if you are not doing well or get worse.      Document Released: 01/21/2008 Document Revised: 07/24/2011 Document Reviewed: 01/21/2008   [employer] Patient Information 2012 [employer].

## 2011-11-20 NOTE — ED Notes (Signed)
Pt. States that she was seen at Urgent Care for disability consult and BP was 218/118. Also states that she has a history of hypertension and is currently taking medications. Denies any chest pain, SOB, blurred vision, nausea/vomiting.

## 2011-11-21 ENCOUNTER — Other Ambulatory Visit (HOSPITAL_COMMUNITY): Payer: Self-pay | Admitting: Family Medicine

## 2011-11-21 DIAGNOSIS — Z139 Encounter for screening, unspecified: Secondary | ICD-10-CM

## 2011-11-24 ENCOUNTER — Ambulatory Visit (HOSPITAL_COMMUNITY): Payer: Medicaid Other

## 2012-04-06 ENCOUNTER — Ambulatory Visit (INDEPENDENT_AMBULATORY_CARE_PROVIDER_SITE_OTHER): Payer: Medicaid Other | Admitting: Urgent Care

## 2012-04-06 ENCOUNTER — Encounter: Payer: Self-pay | Admitting: Urgent Care

## 2012-04-06 VITALS — BP 128/82 | HR 97 | Temp 97.4°F | Ht 66.0 in | Wt 151.4 lb

## 2012-04-06 DIAGNOSIS — K921 Melena: Secondary | ICD-10-CM

## 2012-04-06 DIAGNOSIS — K219 Gastro-esophageal reflux disease without esophagitis: Secondary | ICD-10-CM | POA: Insufficient documentation

## 2012-04-06 DIAGNOSIS — R112 Nausea with vomiting, unspecified: Secondary | ICD-10-CM | POA: Insufficient documentation

## 2012-04-06 DIAGNOSIS — R109 Unspecified abdominal pain: Secondary | ICD-10-CM | POA: Insufficient documentation

## 2012-04-06 MED ORDER — ONDANSETRON HCL 4 MG PO TABS: 4.0000 mg | ORAL_TABLET | Freq: Four times a day (QID) | ORAL | Status: AC | PRN

## 2012-04-06 NOTE — Progress Notes (Signed)
Referring Provider: Tylene Fantasia., PA Primary Care Physician:  Tylene Fantasia., PA Primary Gastroenterologist:  Dr. Jonette Eva  Chief Complaint  Patient presents with  . Abdominal Pain  . Bloated    HPI:  Judy Boyd is a 51 y.o. female here as a referral from Dr.Howard, Kizzie Furnish, PA for abdominal bloating, pain, GERD, and diarrhea for the past 3 months. She reports an unintentional 14# weight gain in the past 3 months.  She states her thyroid was normal at health dept recently.  She describes severe bloating and a "pulling sensation" in her mid abdomen and bilateral flanks. The pain is associated with nausea and vomiting at least 2-3 times in the past month.  Pain 6/10 & knots.  Worse w/ eating/  He has chronic GERD despite being on nexium 40mg  bid.  She is having a  BM every day.  At times several loose bowel movements daily.  C/o melena intemittent small amounts.  Denies NSAIDs.   Past Medical History  Diagnosis Date  . Hypertension   . Asthma   . GERD (gastroesophageal reflux disease)     Past Surgical History  Procedure Date  . Abdominal surgery     Current Outpatient Prescriptions  Medication Sig Dispense Refill  . albuterol (PROVENTIL HFA;VENTOLIN HFA) 108 (90 BASE) MCG/ACT inhaler Inhale 2 puffs into the lungs every 6 (six) hours as needed.        . ALPRAZolam (XANAX) 1 MG tablet Take 1 mg by mouth 3 (three) times daily as needed. anxiety       . amLODipine (NORVASC) 5 MG tablet Take 10 mg by mouth daily.       . ARIPiprazole (ABILIFY) 10 MG tablet Take 10 mg by mouth daily.      . budesonide-formoterol (SYMBICORT) 160-4.5 MCG/ACT inhaler Inhale 2 puffs into the lungs 2 (two) times daily.        Marland Kitchen esomeprazole (NEXIUM) 40 MG capsule Take 40 mg by mouth 2 (two) times daily.       Marland Kitchen estradiol (ESTRACE) 1 MG tablet Take 1 mg by mouth daily.      Marland Kitchen FLUoxetine (PROZAC) 40 MG capsule Take 20 mg by mouth every morning.       Marland Kitchen HYDROcodone-acetaminophen (NORCO)  7.5-325 MG per tablet Take 1 tablet by mouth every 6 (six) hours as needed. pain      . ibuprofen (ADVIL,MOTRIN) 600 MG tablet Take 600 mg by mouth every 6 (six) hours as needed. pain      . lisinopril-hydrochlorothiazide (PRINZIDE,ZESTORETIC) 20-25 MG per tablet Take 1 tablet by mouth daily.      Marland Kitchen loratadine (CLARITIN) 10 MG tablet Take 10 mg by mouth daily.      . montelukast (SINGULAIR) 10 MG tablet Take 10 mg by mouth at bedtime.          Allergies as of 04/06/2012  . (No Known Allergies)    Family history:There is no known family history of colorectal carcinoma , liver disease, or inflammatory bowel disease.  History   Social History  . Marital Status: Single    Spouse Name: N/A    Number of Children: N/A  . Years of Education: N/A   Occupational History  . Not on file.   Social History Main Topics  . Smoking status: Current Everyday Smoker -- 0.5 packs/day    Types: Cigarettes  . Smokeless tobacco: Not on file  . Alcohol Use: Yes     about 6 pack a week  of beer  . Drug Use: No  . Sexually Active: Yes    Birth Control/ Protection: None   Other Topics Concern  . Not on file   Social History Narrative  . No narrative on file   Review of Systems: Gen: See history of present illness CV: Denies chest pain, angina, palpitations, syncope, orthopnea, PND, peripheral edema, and claudication. Resp: Denies dyspnea at rest, dyspnea with exercise, cough, sputum, wheezing, coughing up blood, and pleurisy. GI: Denies vomiting blood, jaundice, and fecal incontinence.   Denies dysphagia or odynophagia. GU : Denies urinary burning, blood in urine, urinary frequency, urinary hesitancy, nocturnal urination, and urinary incontinence. MS: Denies joint pain, limitation of movement, and swelling, stiffness, low back pain, extremity pain. Denies muscle weakness, cramps, atrophy.  Derm: Denies rash, itching, dry skin, hives, moles, warts, or unhealing ulcers.  Psych: Denies depression,  anxiety, memory loss, suicidal ideation, hallucinations, paranoia, and confusion. Heme: Denies bruising, bleeding, and enlarged lymph nodes. Neuro:  Denies any headaches, dizziness, paresthesias. Endo:  Denies any problems with DM, thyroid, adrenal function.  Physical Exam: BP 128/82  Pulse 97  Temp 97.4 F (36.3 C) (Temporal)  Ht 5\' 6"  (1.676 m)  Wt 151 lb 6.4 oz (68.675 kg)  BMI 24.44 kg/m2 No LMP recorded. Patient has had an ablation. General:   Alert,  Well-developed, well-nourished, pleasant and cooperative in NAD Head:  Normocephalic and atraumatic. Eyes:  Sclera clear, no icterus.   Conjunctiva pink. Ears:  Normal auditory acuity. Nose:  No deformity, discharge, or lesions. Mouth:  No deformity or lesions,oropharynx pink & moist. Neck:  Supple; no masses or thyromegaly. Lungs:  Clear throughout to auscultation.   No wheezes, crackles, or rhonchi. No acute distress. Heart:  Regular rate and rhythm; no murmurs, clicks, rubs,  or gallops. Abdomen:  Normal bowel sounds.  No bruits.  Soft, she has tenderness around umbilicus and both upper quadrants.  No masses, hepatosplenomegaly or hernias noted.  No guarding or rebound tenderness.   Rectal:  Deferred. Msk:  Symmetrical without gross deformities. Normal posture. Pulses:  Normal pulses noted. Extremities:  No clubbing or edema. Neurologic:  Alert and oriented x4;  grossly normal neurologically. Skin:  Intact without significant lesions or rashes. Lymph Nodes:  No significant cervical adenopathy. Psych:  Alert and cooperative. Normal mood and affect.

## 2012-04-06 NOTE — Assessment & Plan Note (Addendum)
Judy Boyd is a pleasant 51 y.o. female with 3 month history of severe abdominal pain, bloating, nausea, vomiting, melena GERD and diarrhea.  Differentials are wide & include pancreatitis, irritable bowel syndrome, celiac disease, less likely ileus, PUD, diverticulitis or IBD.  CBC, CMP, lipase, UA Zofran 4 mg every 6 hours as needed for nausea and vomiting CT abdomen and pelvis with IV and oral contrast as soon as possible To ER if you have severe pain EGD plus/minus colonoscopy in near future

## 2012-04-06 NOTE — Patient Instructions (Addendum)
Zofran 4 mg every 6 hours as needed for nausea and vomiting To ER if you have severe pain Go to the lab today and we will call as soon as results and CT scan results are back

## 2012-04-06 NOTE — Assessment & Plan Note (Signed)
Refractory GERD despite twice a day PPI. She also has reports of melena. EGD pending CT findings.  I have discussed risks & benefits which include, but are not limited to, bleeding, infection, perforation & drug reaction.  The patient agrees with this plan & written consent will be obtained.

## 2012-04-07 LAB — CBC WITH DIFFERENTIAL/PLATELET
Basophils Absolute: 0 10*3/uL (ref 0.0–0.1)
HCT: 37.7 % (ref 36.0–46.0)
Hemoglobin: 12.8 g/dL (ref 12.0–15.0)
Lymphocytes Relative: 35 % (ref 12–46)
Lymphs Abs: 3.9 10*3/uL (ref 0.7–4.0)
Monocytes Absolute: 0.6 10*3/uL (ref 0.1–1.0)
Neutro Abs: 6.4 10*3/uL (ref 1.7–7.7)
RBC: 4.36 MIL/uL (ref 3.87–5.11)
RDW: 12.8 % (ref 11.5–15.5)
WBC: 11.2 10*3/uL — ABNORMAL HIGH (ref 4.0–10.5)

## 2012-04-07 LAB — COMPREHENSIVE METABOLIC PANEL
AST: 21 U/L (ref 0–37)
Albumin: 4 g/dL (ref 3.5–5.2)
Alkaline Phosphatase: 71 U/L (ref 39–117)
Glucose, Bld: 74 mg/dL (ref 70–99)
Potassium: 4.4 mEq/L (ref 3.5–5.3)
Sodium: 136 mEq/L (ref 135–145)
Total Protein: 6.9 g/dL (ref 6.0–8.3)

## 2012-04-07 LAB — URINALYSIS, ROUTINE W REFLEX MICROSCOPIC
Bilirubin Urine: NEGATIVE
Ketones, ur: NEGATIVE mg/dL
Protein, ur: NEGATIVE mg/dL
Urobilinogen, UA: 0.2 mg/dL (ref 0.0–1.0)

## 2012-04-07 NOTE — Progress Notes (Signed)
Faxed to PCP

## 2012-04-13 ENCOUNTER — Ambulatory Visit (HOSPITAL_COMMUNITY)
Admission: RE | Admit: 2012-04-13 | Discharge: 2012-04-13 | Disposition: A | Discharge status: Home / Self Care | Payer: Medicaid Other | Source: Ambulatory Visit | Attending: Urgent Care | Admitting: Urgent Care

## 2012-04-13 DIAGNOSIS — N83209 Unspecified ovarian cyst, unspecified side: Secondary | ICD-10-CM | POA: Insufficient documentation

## 2012-04-13 DIAGNOSIS — R109 Unspecified abdominal pain: Secondary | ICD-10-CM | POA: Insufficient documentation

## 2012-04-13 DIAGNOSIS — R9389 Abnormal findings on diagnostic imaging of other specified body structures: Secondary | ICD-10-CM | POA: Insufficient documentation

## 2012-04-13 MED ORDER — IOHEXOL 300 MG/ML  SOLN
100.0000 mL | Freq: Once | INTRAMUSCULAR | Status: AC | PRN
  Administered 2012-04-13: 100 mL via INTRAVENOUS

## 2012-04-14 ENCOUNTER — Other Ambulatory Visit: Payer: Self-pay | Admitting: Gastroenterology

## 2012-04-14 DIAGNOSIS — R197 Diarrhea, unspecified: Secondary | ICD-10-CM

## 2012-04-14 DIAGNOSIS — R109 Unspecified abdominal pain: Secondary | ICD-10-CM

## 2012-04-14 DIAGNOSIS — R110 Nausea: Secondary | ICD-10-CM

## 2012-04-14 MED ORDER — PEG 3350-KCL-NA BICARB-NACL 420 G PO SOLR: 4000.0000 mL | ORAL | Status: AC

## 2012-04-14 NOTE — Progress Notes (Signed)
Patient is scheduled for TCS/EGD in the OR w/SLF on 05/11/12 and instructions were mailed to the patient and she is aware

## 2012-04-14 NOTE — Progress Notes (Signed)
Quick Note:  Routing to Leigh Ann to schedule. ______ 

## 2012-04-14 NOTE — Progress Notes (Signed)
Quick Note:  Discussed with patient.  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  These small cysts are too small to reliably characterize. Suggest follow-up imaging in 6 to 12 months to ensure stability. Pt advised to followup with PCP regarding CT findings.  She agrees with this plan. Please schedule for EGD and colonoscopy with Dr. Darrick Penna in the OR with propofol reason: Multiple psychoactive medications/chronic nausea, vomiting, abdominal pain and diarrhea. Thanks CC: MUSE,ROCHELLE D., PA   ______

## 2012-04-26 ENCOUNTER — Other Ambulatory Visit: Payer: Self-pay | Admitting: Gastroenterology

## 2012-04-26 ENCOUNTER — Encounter (HOSPITAL_COMMUNITY): Payer: Self-pay | Admitting: Pharmacy Technician

## 2012-04-26 ENCOUNTER — Telehealth: Payer: Self-pay | Admitting: General Practice

## 2012-04-26 NOTE — Telephone Encounter (Signed)
I received a call from Raina Mina at [facility].  She stated the patient has Dr. Felecia Shelling on her Medicaid card and he will not authorize for procedures. After further investigating this issue I found that the patient was referred to [place] by the Health Department and is actively seeing them for her primary care. After speaking with the department of social services(kathy Ron Agee) they were not aware this change had taken place.  I wast told by Olegario Messier that they would go ahead and change the patient's medicaid card back to the health department, however this change will take place in October.  I was then asked to call Tresa Moore Kline(cooridantor for medicaid of ) and ask her for an override so the patient's procedure will be paid. I called Tiffany at [phone] and left a message for a return call.

## 2012-04-26 NOTE — Telephone Encounter (Signed)
Per the patient she we wanted to switch to Dr. Felecia Shelling.  I instructed her she needed to get in and see him before her procedure.  If not, we would have to cancel her procedure.   She stated she was going to try and see him this week.

## 2012-04-26 NOTE — Telephone Encounter (Signed)
Patient has an appt with Dr. Felecia Shelling in the morning at 10:00a.m.  I instructed the patient to let Dr. Felecia Shelling know she has a procedure scheduled with Dr. Darrick Penna.

## 2012-04-27 ENCOUNTER — Other Ambulatory Visit (HOSPITAL_COMMUNITY): Payer: Self-pay | Admitting: Internal Medicine

## 2012-04-27 DIAGNOSIS — Z139 Encounter for screening, unspecified: Secondary | ICD-10-CM

## 2012-05-03 ENCOUNTER — Ambulatory Visit (HOSPITAL_COMMUNITY)
Admission: RE | Admit: 2012-05-03 | Discharge: 2012-05-03 | Disposition: A | Discharge status: Home / Self Care | Payer: Medicaid Other | Source: Ambulatory Visit | Attending: Internal Medicine | Admitting: Internal Medicine

## 2012-05-03 ENCOUNTER — Ambulatory Visit (HOSPITAL_COMMUNITY): Payer: Medicaid Other

## 2012-05-03 DIAGNOSIS — Z1231 Encounter for screening mammogram for malignant neoplasm of breast: Secondary | ICD-10-CM | POA: Insufficient documentation

## 2012-05-03 DIAGNOSIS — Z139 Encounter for screening, unspecified: Secondary | ICD-10-CM

## 2012-05-03 NOTE — Patient Instructions (Signed)
Judy Boyd  05/03/2012   Your procedure is scheduled on: 05/11/12   Report to [facility] at 0700 AM.  Call this number if you have problems the morning of surgery: [phone]   Remember:   Do not eat food:After Midnight.  May have clear liquids:until Midnight .  Clear liquids include soda, tea, black coffee, apple or grape juice, broth.  Take these medicines the morning of surgery with A SIP OF WATER: nexium, lisinopril, claritin, singulair, zofran, xanax, abilify, prozac.  Use your albuterol & symbicort inhalers & bring them with you.   Do not wear jewelry, make-up or nail polish.  Do not wear lotions, powders, or perfumes. You may wear deodorant.  Do not shave 48 hours prior to surgery. Men may shave face and neck.  Do not bring valuables to the hospital.  Contacts, dentures or bridgework may not be worn into surgery.  Leave suitcase in the car. After surgery it may be brought to your room.  For patients admitted to the hospital, checkout time is 11:00 AM the day of discharge.   Patients discharged the day of surgery will not be allowed to drive home.  Name and phone number of your driver: family  Special Instructions: N/A   Please read over the following fact sheets that you were given: Pain Booklet, Anesthesia Post-op Instructions and Care and Recovery After Surgery   PATIENT INSTRUCTIONS POST-ANESTHESIA  IMMEDIATELY FOLLOWING SURGERY:  Do not drive or operate machinery for the first twenty four hours after surgery.  Do not make any important decisions for twenty four hours after surgery or while taking narcotic pain medications or sedatives.  If you develop intractable nausea and vomiting or a severe headache please notify your doctor immediately.  FOLLOW-UP:  Please make an appointment with your surgeon as instructed. You do not need to follow up with anesthesia unless specifically instructed to do so.  WOUND CARE INSTRUCTIONS (if applicable):  Keep a dry clean  dressing on the anesthesia/puncture wound site if there is drainage.  Once the wound has quit draining you may leave it open to air.  Generally you should leave the bandage intact for twenty four hours unless there is drainage.  If the epidural site drains for more than 36-48 hours please call the anesthesia department.  QUESTIONS?:  Please feel free to call your physician or the hospital operator if you have any questions, and they will be happy to assist you.      Esophagogastroduodenoscopy This is an endoscopic procedure (a procedure that uses a device like a flexible telescope) that allows your caregiver to view the upper stomach and small bowel. This test allows your caregiver to look at the esophagus. The esophagus carries food from your mouth to your stomach. They can also look at your duodenum. This is the first part of the small intestine that attaches to the stomach. This test is used to detect problems in the bowel such as ulcers and inflammation. PREPARATION FOR TEST Nothing to eat after midnight the day before the test. NORMAL FINDINGS Normal esophagus, stomach, and duodenum. Ranges for normal findings may vary among different laboratories and hospitals. You should always check with your doctor after having lab work or other tests done to discuss the meaning of your test results and whether your values are considered within normal limits. MEANING OF TEST  Your caregiver will go over the test results with you and discuss the importance and meaning of your results, as well as  treatment options and the need for additional tests if necessary. OBTAINING THE TEST RESULTS It is your responsibility to obtain your test results. Ask the lab or department performing the test when and how you will get your results. Document Released: 12/05/2004 Document Revised: 07/24/2011 Document Reviewed: 07/14/2008 [facility] Patient Information 2012 [place], [place].olonoscopy A colonoscopy is an exam to  evaluate your entire colon. In this exam, your colon is cleansed. A long fiberoptic tube is inserted through your rectum and into your colon. The fiberoptic scope (endoscope) is a long bundle of enclosed and very flexible fibers. These fibers transmit light to the area examined and send images from that area to your caregiver. Discomfort is usually minimal. You may be given a drug to help you sleep (sedative) during or prior to the procedure. This exam helps to detect lumps (tumors), polyps, inflammation, and areas of bleeding. Your caregiver may also take a small piece of tissue (biopsy) that will be examined under a microscope. LET YOUR CAREGIVER KNOW ABOUT:   Allergies to food or medicine.   Medicines taken, including vitamins, herbs, eyedrops, over-the-counter medicines, and creams.   Use of steroids (by mouth or creams).   Previous problems with anesthetics or numbing medicines.   History of bleeding problems or blood clots.   Previous surgery.   Other health problems, including diabetes and kidney problems.   Possibility of pregnancy, if this applies.  BEFORE THE PROCEDURE   A clear liquid diet may be required for 2 days before the exam.   Ask your caregiver about changing or stopping your regular medications.   Liquid injections (enemas) or laxatives may be required.   A large amount of electrolyte solution may be given to you to drink over a short period of time. This solution is used to clean out your colon.   You should be present 60 minutes prior to your procedure or as directed by your caregiver.  AFTER THE PROCEDURE   If you received a sedative or pain relieving medication, you will need to arrange for someone to drive you home.   Occasionally, there is a little blood passed with the first bowel movement. Do not be concerned.  FINDING OUT THE RESULTS OF YOUR TEST Not all test results are available during your visit. If your test results are not back during the visit,  make an appointment with your caregiver to find out the results. Do not assume everything is normal if you have not heard from your caregiver or the medical facility. It is important for you to follow up on all of your test results. HOME CARE INSTRUCTIONS   It is not unusual to pass moderate amounts of gas and experience mild abdominal cramping following the procedure. This is due to air being used to inflate your colon during the exam. Walking or a warm pack on your belly (abdomen) may help.   You may resume all normal meals and activities after sedatives and medicines have worn off.   Only take over-the-counter or prescription medicines for pain, discomfort, or fever as directed by your caregiver. Do not use aspirin or blood thinners if a biopsy was taken. Consult your caregiver for medicine usage if biopsies were taken.  SEEK IMMEDIATE MEDICAL CARE IF:   You have a fever.   You pass large blood clots or fill a toilet with blood following the procedure. This may also occur 10 to 14 days following the procedure. This is more likely if a biopsy was taken.  You develop abdominal pain that keeps getting worse and cannot be relieved with medicine.  Document Released: 08/01/2000 Document Revised: 07/24/2011 Document Reviewed: 03/16/2008 [facility] Patient Information 2012 [place], [place].

## 2012-05-04 ENCOUNTER — Encounter (HOSPITAL_COMMUNITY): Payer: Self-pay

## 2012-05-04 ENCOUNTER — Encounter (HOSPITAL_COMMUNITY)
Admission: RE | Admit: 2012-05-04 | Discharge: 2012-05-04 | Disposition: A | Discharge status: Home / Self Care | Payer: Medicaid Other | Source: Ambulatory Visit | Attending: Gastroenterology | Admitting: Gastroenterology

## 2012-05-04 HISTORY — DX: Major depressive disorder, single episode, unspecified: F32.9

## 2012-05-04 HISTORY — DX: Depression, unspecified: F32.A

## 2012-05-04 HISTORY — DX: Unspecified osteoarthritis, unspecified site: M19.90

## 2012-05-04 HISTORY — DX: Anxiety disorder, unspecified: F41.9

## 2012-05-04 LAB — BASIC METABOLIC PANEL
BUN: 15 mg/dL (ref 6–23)
Chloride: 94 mEq/L — ABNORMAL LOW (ref 96–112)
GFR calc Af Amer: 60 mL/min — ABNORMAL LOW (ref >90)
Potassium: 4.3 mEq/L (ref 3.5–5.1)

## 2012-05-04 LAB — HEMOGLOBIN AND HEMATOCRIT, BLOOD: Hemoglobin: 13.8 g/dL (ref 12.0–15.0)

## 2012-05-06 ENCOUNTER — Emergency Department (HOSPITAL_COMMUNITY)
Admission: EM | Admit: 2012-05-06 | Discharge: 2012-05-06 | Disposition: A | Discharge status: Home / Self Care | Payer: Medicaid Other | Attending: Emergency Medicine | Admitting: Emergency Medicine

## 2012-05-06 ENCOUNTER — Emergency Department (HOSPITAL_COMMUNITY): Payer: Medicaid Other

## 2012-05-06 ENCOUNTER — Encounter (HOSPITAL_COMMUNITY): Payer: Self-pay | Admitting: *Deleted

## 2012-05-06 DIAGNOSIS — F3289 Other specified depressive episodes: Secondary | ICD-10-CM | POA: Insufficient documentation

## 2012-05-06 DIAGNOSIS — F17200 Nicotine dependence, unspecified, uncomplicated: Secondary | ICD-10-CM | POA: Insufficient documentation

## 2012-05-06 DIAGNOSIS — I10 Essential (primary) hypertension: Secondary | ICD-10-CM | POA: Insufficient documentation

## 2012-05-06 DIAGNOSIS — J069 Acute upper respiratory infection, unspecified: Secondary | ICD-10-CM

## 2012-05-06 DIAGNOSIS — F329 Major depressive disorder, single episode, unspecified: Secondary | ICD-10-CM | POA: Insufficient documentation

## 2012-05-06 DIAGNOSIS — Z79899 Other long term (current) drug therapy: Secondary | ICD-10-CM | POA: Insufficient documentation

## 2012-05-06 DIAGNOSIS — J45909 Unspecified asthma, uncomplicated: Secondary | ICD-10-CM | POA: Insufficient documentation

## 2012-05-06 DIAGNOSIS — F411 Generalized anxiety disorder: Secondary | ICD-10-CM | POA: Insufficient documentation

## 2012-05-06 DIAGNOSIS — K219 Gastro-esophageal reflux disease without esophagitis: Secondary | ICD-10-CM | POA: Insufficient documentation

## 2012-05-06 MED ORDER — PROMETHAZINE-CODEINE 6.25-10 MG/5ML PO SYRP: 5.0000 mL | ORAL_SOLUTION | ORAL | Status: DC | PRN

## 2012-05-06 NOTE — ED Provider Notes (Signed)
History     CSN: [id number]  Arrival date & time 05/06/12  0807   First MD Initiated Contact with Patient 05/06/12 0815      Chief Complaint  Patient presents with  . Cough    (Consider location/radiation/quality/duration/timing/severity/associated sxs/prior treatment) Patient is a 51 y.o. female presenting with cough. The history is provided by the patient.  Cough This is a new problem. The current episode started more than 1 week ago. The problem occurs hourly. The problem has not changed since onset.The cough is productive of sputum. There has been no fever. Associated symptoms include chills, rhinorrhea and myalgias. Pertinent negatives include no chest pain, no shortness of breath and no wheezing. She has tried nothing for the symptoms. The treatment provided no relief. She is a smoker. Her past medical history is significant for bronchitis and asthma.    Past Medical History  Diagnosis Date  . Hypertension   . Asthma   . GERD (gastroesophageal reflux disease)   . Bowel obstruction   . Anxiety   . Depression   . Arthritis     Past Surgical History  Procedure Date  . Abdominal surgery 2003    blockage  . Uterine ablation   . Left salpingectomy     No family history on file.  History  Substance Use Topics  . Smoking status: Current Every Day Smoker -- 0.5 packs/day for 10 years    Types: Cigarettes  . Smokeless tobacco: Not on file  . Alcohol Use: Yes     about 6 pack a week of beer    OB History    Grav Para Term Preterm Abortions TAB SAB Ect Mult Living                  Review of Systems  Constitutional: Positive for chills. Negative for activity change.       All ROS Neg except as noted in HPI  HENT: Positive for rhinorrhea. Negative for nosebleeds and neck pain.   Eyes: Negative for photophobia and discharge.  Respiratory: Positive for cough. Negative for shortness of breath and wheezing.   Cardiovascular: Negative for chest pain and palpitations.    Gastrointestinal: Negative for abdominal pain and blood in stool.  Genitourinary: Negative for dysuria, frequency and hematuria.  Musculoskeletal: Positive for myalgias and arthralgias. Negative for back pain.  Skin: Negative.   Neurological: Negative for dizziness, seizures and speech difficulty.  Psychiatric/Behavioral: Negative for hallucinations and confusion. The patient is nervous/anxious.        Depression    Allergies  Review of patient's allergies indicates no known allergies.  Home Medications   Current Outpatient Rx  Name Route Sig Dispense Refill  . ALBUTEROL SULFATE HFA 108 (90 BASE) MCG/ACT IN AERS Inhalation Inhale 2 puffs into the lungs every 6 (six) hours as needed. Shortness of breath    . ALPRAZOLAM 1 MG PO TABS Oral Take 1 mg by mouth 3 (three) times daily as needed. anxiety     . AMLODIPINE BESYLATE 10 MG PO TABS Oral Take 10 mg by mouth daily.    . ARIPIPRAZOLE 10 MG PO TABS Oral Take 10 mg by mouth daily.    . BUDESONIDE-FORMOTEROL FUMARATE 160-4.5 MCG/ACT IN AERO Inhalation Inhale 2 puffs into the lungs 2 (two) times daily.      . CYCLOBENZAPRINE HCL 10 MG PO TABS Oral Take 10 mg by mouth 3 (three) times daily as needed. Muscle Spasms    . ESOMEPRAZOLE MAGNESIUM 40 MG PO  CPDR Oral Take 40 mg by mouth 2 (two) times daily.     Marland Kitchen ESTRADIOL 1 MG PO TABS Oral Take 1 mg by mouth daily.    Marland Kitchen FLUOXETINE HCL 20 MG PO CAPS Oral Take 20 mg by mouth daily.    Marland Kitchen HYDROCODONE-ACETAMINOPHEN 7.5-325 MG PO TABS Oral Take 1 tablet by mouth every 6 (six) hours as needed. pain    . LISINOPRIL-HYDROCHLOROTHIAZIDE 20-25 MG PO TABS Oral Take 1 tablet by mouth daily.    Marland Kitchen LORATADINE 10 MG PO TABS Oral Take 10 mg by mouth daily.    Marland Kitchen MONTELUKAST SODIUM 10 MG PO TABS Oral Take 10 mg by mouth at bedtime.      Marland Kitchen NAPROXEN 500 MG PO TABS Oral Take 500 mg by mouth 2 (two) times daily with a meal.    . ONDANSETRON HCL 4 MG PO TABS Oral Take 4 mg by mouth every 8 (eight) hours as needed. Nausea  and Vomiting      BP 99/78  Pulse 108  Temp 98.1 F (36.7 C)  Resp 20  Ht 5\' 6"  (1.676 m)  Wt 151 lb (68.493 kg)  BMI 24.37 kg/m2  SpO2 99%  Physical Exam  Nursing note and vitals reviewed. Constitutional: She is oriented to person, place, and time. She appears well-developed and well-nourished.  Non-toxic appearance.  HENT:  Head: Normocephalic.  Right Ear: Tympanic membrane and external ear normal.  Left Ear: Tympanic membrane and external ear normal.  Eyes: EOM and lids are normal. Pupils are equal, round, and reactive to light.  Neck: Normal range of motion. Neck supple. Carotid bruit is not present.  Cardiovascular: Normal rate, regular rhythm, normal heart sounds, intact distal pulses and normal pulses.   Pulmonary/Chest: Breath sounds normal. No respiratory distress.       Rhonchi present. No focal consolidation.  Abdominal: Soft. Bowel sounds are normal. There is no tenderness. There is no guarding.  Musculoskeletal: Normal range of motion.  Lymphadenopathy:       Head (right side): No submandibular adenopathy present.       Head (left side): No submandibular adenopathy present.    She has no cervical adenopathy.  Neurological: She is alert and oriented to person, place, and time. She has normal strength. No cranial nerve deficit or sensory deficit.  Skin: Skin is warm and dry.  Psychiatric: She has a normal mood and affect. Her speech is normal.    ED Course  Procedures (including critical care time)  Labs Reviewed - No data to display Dg Chest 2 View  05/06/2012  *RADIOLOGY REPORT*  Clinical Data: Cough, congestion.  CHEST - 2 VIEW  Comparison: 11/20/2011  Findings: Lungs are mildly hyperexpanded and clear. Cardiomediastinal contours are within normal range.  No pleural effusion or pneumothorax.  No acute osseous finding.  IMPRESSION: No radiographic evidence of acute cardiopulmonary process.   Original Report Authenticated By: Waneta Martins, M.D.      No  diagnosis found.    MDM  I have reviewed nursing notes, vital signs, and all appropriate lab and imaging results for this patient. Chest xray negative for pneumonia or acute changes. Rx for promethazine cough med given. Pt to continue claritin and singulair.       Kathie Dike, [place] 05/06/12 [phone]

## 2012-05-06 NOTE — ED Provider Notes (Signed)
Medical screening examination/treatment/procedure(s) were performed by non-physician practitioner and as supervising physician I was immediately available for consultation/collaboration.   Jerimy Johanson L Jaqwon Manfred, MD 05/06/12 1335 

## 2012-05-06 NOTE — ED Notes (Signed)
Pt states productive cough, yellow and brown in color since receiving flu shot last week.

## 2012-05-11 ENCOUNTER — Ambulatory Visit (HOSPITAL_COMMUNITY): Payer: Medicaid Other | Admitting: Anesthesiology

## 2012-05-11 ENCOUNTER — Encounter (HOSPITAL_COMMUNITY): Payer: Self-pay | Admitting: Anesthesiology

## 2012-05-11 ENCOUNTER — Encounter (HOSPITAL_COMMUNITY)
Admission: RE | Disposition: A | Discharge status: Home / Self Care | Payer: Self-pay | Source: Ambulatory Visit | Attending: Gastroenterology

## 2012-05-11 ENCOUNTER — Encounter (HOSPITAL_COMMUNITY): Payer: Self-pay

## 2012-05-11 ENCOUNTER — Ambulatory Visit (HOSPITAL_COMMUNITY)
Admission: RE | Admit: 2012-05-11 | Discharge: 2012-05-11 | Disposition: A | Discharge status: Home / Self Care | Payer: Medicaid Other | Source: Ambulatory Visit | Attending: Gastroenterology | Admitting: Gastroenterology

## 2012-05-11 DIAGNOSIS — K3189 Other diseases of stomach and duodenum: Secondary | ICD-10-CM | POA: Insufficient documentation

## 2012-05-11 DIAGNOSIS — K5730 Diverticulosis of large intestine without perforation or abscess without bleeding: Secondary | ICD-10-CM | POA: Insufficient documentation

## 2012-05-11 DIAGNOSIS — K2940 Chronic atrophic gastritis without bleeding: Secondary | ICD-10-CM | POA: Insufficient documentation

## 2012-05-11 DIAGNOSIS — K2970 Gastritis, unspecified, without bleeding: Secondary | ICD-10-CM

## 2012-05-11 DIAGNOSIS — R197 Diarrhea, unspecified: Secondary | ICD-10-CM | POA: Insufficient documentation

## 2012-05-11 DIAGNOSIS — R109 Unspecified abdominal pain: Secondary | ICD-10-CM

## 2012-05-11 DIAGNOSIS — R1013 Epigastric pain: Secondary | ICD-10-CM

## 2012-05-11 DIAGNOSIS — K2980 Duodenitis without bleeding: Secondary | ICD-10-CM | POA: Insufficient documentation

## 2012-05-11 DIAGNOSIS — Z01812 Encounter for preprocedural laboratory examination: Secondary | ICD-10-CM | POA: Insufficient documentation

## 2012-05-11 DIAGNOSIS — K449 Diaphragmatic hernia without obstruction or gangrene: Secondary | ICD-10-CM | POA: Insufficient documentation

## 2012-05-11 DIAGNOSIS — R110 Nausea: Secondary | ICD-10-CM

## 2012-05-11 DIAGNOSIS — R1110 Vomiting, unspecified: Secondary | ICD-10-CM | POA: Insufficient documentation

## 2012-05-11 DIAGNOSIS — I10 Essential (primary) hypertension: Secondary | ICD-10-CM | POA: Insufficient documentation

## 2012-05-11 DIAGNOSIS — K2990 Gastroduodenitis, unspecified, without bleeding: Secondary | ICD-10-CM

## 2012-05-11 DIAGNOSIS — D126 Benign neoplasm of colon, unspecified: Secondary | ICD-10-CM | POA: Insufficient documentation

## 2012-05-11 DIAGNOSIS — K222 Esophageal obstruction: Secondary | ICD-10-CM | POA: Insufficient documentation

## 2012-05-11 SURGERY — COLONOSCOPY WITH PROPOFOL
Anesthesia: Monitor Anesthesia Care | Site: Rectum

## 2012-05-11 MED ORDER — PROPOFOL INFUSION 10 MG/ML OPTIME
INTRAVENOUS | Status: DC | PRN
  Administered 2012-05-11: 09:00:00 via INTRAVENOUS
  Administered 2012-05-11: 100 ug/kg/min via INTRAVENOUS

## 2012-05-11 MED ORDER — PROPOFOL 10 MG/ML IV EMUL
INTRAVENOUS | Status: AC
  Filled 2012-05-11: qty 20

## 2012-05-11 MED ORDER — GLYCOPYRROLATE 0.2 MG/ML IJ SOLN
INTRAMUSCULAR | Status: AC
  Filled 2012-05-11: qty 1

## 2012-05-11 MED ORDER — LACTATED RINGERS IV SOLN
INTRAVENOUS | Status: DC
  Administered 2012-05-11: 08:00:00 via INTRAVENOUS

## 2012-05-11 MED ORDER — GLYCOPYRROLATE 0.2 MG/ML IJ SOLN
0.2000 mg | Freq: Once | INTRAMUSCULAR | Status: AC
  Administered 2012-05-11: 0.2 mg via INTRAVENOUS

## 2012-05-11 MED ORDER — ONDANSETRON HCL 4 MG/2ML IJ SOLN
INTRAMUSCULAR | Status: AC
  Filled 2012-05-11: qty 2

## 2012-05-11 MED ORDER — MIDAZOLAM HCL 2 MG/2ML IJ SOLN
INTRAMUSCULAR | Status: AC
  Filled 2012-05-11: qty 2

## 2012-05-11 MED ORDER — FENTANYL CITRATE 0.05 MG/ML IJ SOLN
INTRAMUSCULAR | Status: DC | PRN
  Administered 2012-05-11 (×2): 50 ug via INTRAVENOUS

## 2012-05-11 MED ORDER — WATER FOR IRRIGATION, STERILE IR SOLN
Status: DC | PRN
  Administered 2012-05-11: 1000 mL

## 2012-05-11 MED ORDER — MIDAZOLAM HCL 2 MG/2ML IJ SOLN
1.0000 mg | INTRAMUSCULAR | Status: DC | PRN
  Administered 2012-05-11: 2 mg via INTRAVENOUS

## 2012-05-11 MED ORDER — FENTANYL CITRATE 0.05 MG/ML IJ SOLN: 25.0000 ug | INTRAMUSCULAR | Status: DC | PRN

## 2012-05-11 MED ORDER — ONDANSETRON HCL 4 MG/2ML IJ SOLN
4.0000 mg | Freq: Once | INTRAMUSCULAR | Status: AC
  Administered 2012-05-11: 4 mg via INTRAVENOUS

## 2012-05-11 MED ORDER — BUTAMBEN-TETRACAINE-BENZOCAINE 2-2-14 % EX AERO
1.0000 | INHALATION_SPRAY | Freq: Once | CUTANEOUS | Status: AC
  Administered 2012-05-11: 1 via TOPICAL
  Filled 2012-05-11: qty 56

## 2012-05-11 MED ORDER — ONDANSETRON HCL 4 MG/2ML IJ SOLN: 4.0000 mg | Freq: Once | INTRAMUSCULAR | Status: DC | PRN

## 2012-05-11 MED ORDER — FENTANYL CITRATE 0.05 MG/ML IJ SOLN
INTRAMUSCULAR | Status: AC
  Filled 2012-05-11: qty 2

## 2012-05-11 MED ORDER — STERILE WATER FOR IRRIGATION IR SOLN
Status: DC | PRN
  Administered 2012-05-11: 09:00:00

## 2012-05-11 MED ORDER — PROPOFOL 10 MG/ML IV BOLUS
INTRAVENOUS | Status: DC | PRN
Start: 2012-05-11 — End: 2012-05-11
  Administered 2012-05-11: 10 mg via INTRAVENOUS
  Administered 2012-05-11 (×2): 20 mg via INTRAVENOUS
  Administered 2012-05-11: 10 mg via INTRAVENOUS

## 2012-05-11 SURGICAL SUPPLY — 22 items
BLOCK BITE 60FR ADLT L/F BLUE (MISCELLANEOUS) ×3 IMPLANT
ELECT REM PT RETURN 9FT ADLT (ELECTROSURGICAL)
ELECTRODE REM PT RTRN 9FT ADLT (ELECTROSURGICAL) IMPLANT
FCP BXJMBJMB 240X2.8X (CUTTING FORCEPS)
FLOOR PAD 36X40 (MISCELLANEOUS) ×3
FORCEPS BIOP RAD 4 LRG CAP 4 (CUTTING FORCEPS) ×6 IMPLANT
FORCEPS BIOP RJ4 240 W/NDL (CUTTING FORCEPS)
FORCEPS BXJMBJMB 240X2.8X (CUTTING FORCEPS) IMPLANT
INJECTOR/SNARE I SNARE (MISCELLANEOUS) IMPLANT
LUBRICANT JELLY 4.5OZ STERILE (MISCELLANEOUS) ×3 IMPLANT
MANIFOLD NEPTUNE II (INSTRUMENTS) ×3 IMPLANT
NEEDLE SCLEROTHERAPY 25GX240 (NEEDLE) IMPLANT
PAD FLOOR 36X40 (MISCELLANEOUS) ×2 IMPLANT
PROBE APC STR FIRE (PROBE) IMPLANT
PROBE INJECTION GOLD (MISCELLANEOUS)
PROBE INJECTION GOLD 7FR (MISCELLANEOUS) IMPLANT
SNARE SHORT THROW 13M SML OVAL (MISCELLANEOUS) ×3 IMPLANT
SYR 50ML LL SCALE MARK (SYRINGE) ×3 IMPLANT
TRAP SPECIMEN MUCOUS 40CC (MISCELLANEOUS) IMPLANT
TUBING ENDO SMARTCAP PENTAX (MISCELLANEOUS) ×3 IMPLANT
TUBING IRRIGATION ENDOGATOR (MISCELLANEOUS) ×3 IMPLANT
WATER STERILE IRR 1000ML POUR (IV SOLUTION) ×3 IMPLANT

## 2012-05-11 NOTE — Op Note (Signed)
[facility] [address] [place], [address]   COLONOSCOPY PROCEDURE REPORT  PATIENT: Judy Boyd, Judy Boyd  MR#: [id number] BIRTHDATE: Sep 27, 1960 , 51  yrs. old GENDER: Female ENDOSCOPIST: Jonette Eva, MD REFERRED OZ:HYQM, Rochelle PROCEDURE DATE:  05/11/2012 PROCEDURE:   ILEOColonoscopy with biopsy INDICATIONS:diarrhea, abdominal pain, vomiting, ? MELENA-AUG 2013 HB 12.8, SEP 2013 HB 13.8 MEDICATIONS: MAC sedation, administered by CRNA  DESCRIPTION OF PROCEDURE:    Physical exam was performed.  Informed consent was obtained from the patient after explaining the benefits, risks, and alternatives to procedure.  The patient was connected to monitor and placed in left lateral position. Continuous oxygen was provided by nasal cannula and IV medicine administered through an indwelling cannula.  After administration of sedation and rectal exam, the patients rectum was intubated and the     colonoscope was advanced under direct visualization to the ileum.  The scope was removed slowly by carefully examining the color, texture, anatomy, and integrity mucosa on the way out.  The patient was recovered in endoscopy and discharged home in satisfactory condition.       COLON FINDINGS: The mucosa appeared normal in the terminal ileum.  10-20 CM VISUALIZED , Mild diverticulosis was noted in the sigmoid colon and descending colon.  , Small internal hemorrhoids were found.  , and The colonic mucosa appeared normal.  Multiple random biopsies of the area were performed TO EVALUATE FOR MICROSCOPIC COLITIS.  PREP QUALITY: excellent. CECAL W/D TIME: 9 minutes  COMPLICATIONS: None  ENDOSCOPIC IMPRESSION: 1.   Normal mucosa in the terminal ileum 2.   Mild diverticulosis was noted in the sigmoid colon and descending colon 3.   The colon was otherwise normal 4.   Small internal hemorrhoids 5.   NO OBVIOUS SOURCE FOR ABD PAIN, DIARRHEA, NV  IDENTIFIED.   RECOMMENDATIONS: ADD BENTYL 10 MG AT NOON AND AT BEDTIME TO HELP WITH DIARRHEA AND ABD CRAMPS.  DICYCLOMINE MAY CAUSE CONSTIPATION, DRY EYES/DRY MOUTH, DROWSINESS, AND DIFFICULTY URINATING.  CONTINUE NEXIUM.  TAKE 30 MINUTES PRIOR TO BREAKFAST AND SUPPER.  NO ASPIRIN OR IBUPROFEN FOR 1 MONTH.  RESTART OCT 25.  FOLLOW A HIGH FIBER/LOW FAT DIET.  AVOID ITEMS THAT CAUSE BLOATING.   PROCEED WITH EGD.  BIOPSY RESULTS WILL BE BACK IN 7 DAYS.  FOLLOW UP IN 4 MOS.  TCS IN 10 YEARS.       _______________________________ Rosalie DoctorJonette Eva, MD 05/11/2012 1:45 PM     PATIENT NAME:  Judy Boyd MR#: [id number]

## 2012-05-11 NOTE — Op Note (Signed)
[facility] [address] [place], [address]   ENDOSCOPY PROCEDURE REPORT  PATIENT: Serenna, Luckman  MR#: [id number] BIRTHDATE: 09-25-1960 , 51  yrs. old GENDER: Female  ENDOSCOPIST: Jonette Eva, MD REFERRED XB:[id number] Muse, PA  PROCEDURE DATE: 05/11/2012 PROCEDURE:   EGD w/ biopsy  INDICATIONS:dyspepsia. MEDICATIONS: MAC sedation, administered by CRNA TOPICAL ANESTHETIC:   Cetacaine Spray  DESCRIPTION OF PROCEDURE:     Physical exam was performed.  Informed consent was obtained from the patient after explaining the benefits, risks, and alternatives to the procedure.  The patient was connected to the monitor and placed in the left lateral position.  Continuous oxygen was provided by nasal cannula and IV medicine administered through an indwelling cannula.  After administration of sedation, the patients esophagus was intubated and the     endoscope was advanced under direct visualization to the second portion of the duodenum.  The scope was removed slowly by carefully examining the color, texture, anatomy, and integrity of the mucosa on the way out.  The patient was recovered in endoscopy and discharged home in satisfactory condition.      ESOPHAGUS: A Schatzki ring was found.   A small hiatal hernia was noted.  STOMACH: Non-erosive gastritis (inflammation) was found.  Multiple biopsies were performed using cold forceps.  DUODENUM: Mild duodenal inflammation was found. COMPLICATIONS:   None  ENDOSCOPIC IMPRESSION: 1.   Schatzki ring was found 2.   Small hiatal hernia 3.   Non-erosive gastritis (inflammation) was found; multiple biopsies 4.   Duodenal inflammation was found 5.  NAUSEA IS MOST LIKELY DUE TO GASTRITIS AND DUODENITIS. ABDOMINAL PAIN AND DIARRHEA ARE MOST LIKELY IRRITABLE BOWEL.  RECOMMENDATIONS: ADD BENTYL 10 MG AT NOON AND AT BEDTIME TO HELP WITH DIARRHEA AND ABD CRAMPS.  DICYCLOMINE MAY CAUSE CONSTIPATION, DRY  EYES/DRY MOUTH, DROWSINESS, AND DIFFICULTY URINATING.  CONTINUE NEXIUM.  TAKE 30 MINUTES PRIOR TO BREAKFAST AND SUPPER.  NO ASPIRIN OR IBUPROFEN FOR 1 MONTH.  RESTART OCT 25.  AVOID ITEMS THAT TRIGGER GASTRITIS.  FOLLOW A HIGH FIBER/LOW FAT DIET.  AVOID ITEMS THAT CAUSE BLOATING. SEE INFO BELOW.  BIOPSY RESULTS WILL BE BACK IN 7 DAYS.  FOLLOW UP IN 4 MOS.   REPEAT EXAM:   _______________________________ Rosalie DoctorJonette Eva, MD 05/11/2012 1:52 PM       PATIENT NAME:  Amalee, Thweatt MR#: [id number]

## 2012-05-11 NOTE — Anesthesia Postprocedure Evaluation (Signed)
  Anesthesia Post-op Note  Patient: Judy Boyd  Procedure(s) Performed: Procedure(s) (LRB) with comments: COLONOSCOPY WITH PROPOFOL (N/A) - in cecum at 0911,total withdrawal time 9 min. done at 0920 ESOPHAGOGASTRODUODENOSCOPY (EGD) WITH PROPOFOL (N/A) - started at 0928  Patient Location: PACU  Anesthesia Type: MAC  Level of Consciousness: awake and alert   Airway and Oxygen Therapy: Patient Spontanous Breathing and Patient connected to face mask oxygen  Post-op Pain: none  Post-op Assessment: Post-op Vital signs reviewed, Patient's Cardiovascular Status Stable, Respiratory Function Stable, Patent Airway and No signs of Nausea or vomiting  Post-op Vital Signs: Reviewed and stable  Complications: No apparent anesthesia complications

## 2012-05-11 NOTE — H&P (Signed)
Primary Care Physician:  Avon Gully, MD Primary Gastroenterologist:  Dr. Darrick Penna  Pre-Procedure History & Physical: HPI:  Judy Boyd is a 51 y.o. female here for NAUSEA/VOMTIING/DIARRHEA.  Past Medical History  Diagnosis Date  . Hypertension   . Asthma   . GERD (gastroesophageal reflux disease)   . Bowel obstruction   . Anxiety   . Depression   . Arthritis     Past Surgical History  Procedure Date  . Abdominal surgery 2003    blockage  . Uterine ablation   . Left salpingectomy     Prior to Admission medications   Medication Sig Start Date End Date Taking? Authorizing Provider  albuterol (PROVENTIL HFA;VENTOLIN HFA) 108 (90 BASE) MCG/ACT inhaler Inhale 2 puffs into the lungs every 6 (six) hours as needed. Shortness of breath   Yes Historical Provider, MD  ALPRAZolam (XANAX) 1 MG tablet Take 1 mg by mouth 3 (three) times daily as needed. anxiety    Yes Historical Provider, MD  amLODipine (NORVASC) 10 MG tablet Take 10 mg by mouth daily.   Yes Historical Provider, MD  ARIPiprazole (ABILIFY) 10 MG tablet Take 10 mg by mouth daily.   Yes Historical Provider, MD  budesonide-formoterol (SYMBICORT) 160-4.5 MCG/ACT inhaler Inhale 2 puffs into the lungs 2 (two) times daily.     Yes Historical Provider, MD  cyclobenzaprine (FLEXERIL) 10 MG tablet Take 10 mg by mouth 3 (three) times daily as needed. Muscle Spasms   Yes Historical Provider, MD  esomeprazole (NEXIUM) 40 MG capsule Take 40 mg by mouth 2 (two) times daily.    Yes Historical Provider, MD  estradiol (ESTRACE) 1 MG tablet Take 1 mg by mouth daily.   Yes Historical Provider, MD  FLUoxetine (PROZAC) 20 MG capsule Take 20 mg by mouth daily.   Yes Historical Provider, MD  HYDROcodone-acetaminophen (NORCO) 7.5-325 MG per tablet Take 1 tablet by mouth every 6 (six) hours as needed. pain   Yes Historical Provider, MD  lisinopril-hydrochlorothiazide (PRINZIDE,ZESTORETIC) 20-25 MG per tablet Take 1 tablet by mouth daily.   Yes  Historical Provider, MD  loratadine (CLARITIN) 10 MG tablet Take 10 mg by mouth daily.   Yes Historical Provider, MD  montelukast (SINGULAIR) 10 MG tablet Take 10 mg by mouth at bedtime.     Yes Historical Provider, MD  naproxen (NAPROSYN) 500 MG tablet Take 500 mg by mouth 2 (two) times daily with a meal.   Yes Historical Provider, MD  ondansetron (ZOFRAN) 4 MG tablet Take 4 mg by mouth every 8 (eight) hours as needed. Nausea and Vomiting   Yes Historical Provider, MD  promethazine-codeine (PHENERGAN WITH CODEINE) 6.25-10 MG/5ML syrup Take 5 mLs by mouth every 4 (four) hours as needed for cough. 05/06/12  Yes Kathie Dike, PA    Allergies as of 04/14/2012  . (No Known Allergies)    History reviewed. No pertinent family history.  History   Social History  . Marital Status: Single    Spouse Name: N/A    Number of Children: 2  . Years of Education: N/A   Occupational History  . unemployed    Social History Main Topics  . Smoking status: Current Every Day Smoker -- 0.5 packs/day for 10 years    Types: Cigarettes  . Smokeless tobacco: Not on file  . Alcohol Use: Yes     about 6 pack a week of beer  . Drug Use: No  . Sexually Active: Yes    Birth Control/ Protection: None  Other Topics Concern  . Not on file   Social History Narrative  . No narrative on file    Review of Systems: See HPI, otherwise negative ROS   Physical Exam: BP 122/89  Pulse 92  Temp 98.2 F (36.8 C) (Oral)  Resp 32  SpO2 100% General:   Alert,  pleasant and cooperative in NAD Head:  Normocephalic and atraumatic. Neck:  Supple; Lungs:  Clear throughout to auscultation.    Heart:  Regular rate and rhythm. Abdomen:  Soft, nontender and nondistended. Normal bowel sounds, without guarding, and without rebound.   Neurologic:  Alert and  oriented x4;  grossly normal neurologically.  Impression/Plan:     NAUSEA/VOMTIING/DIARRHEA  PLAN: EGD/TCS TODAY

## 2012-05-11 NOTE — Transfer of Care (Signed)
Immediate Anesthesia Transfer of Care Note  Patient: Judy Boyd  Procedure(s) Performed: Procedure(s) (LRB) with comments: COLONOSCOPY WITH PROPOFOL (N/A) - in cecum at 0911,total withdrawal time 9 min. done at 0920 ESOPHAGOGASTRODUODENOSCOPY (EGD) WITH PROPOFOL (N/A) - started at 0928  Patient Location: PACU  Anesthesia Type: MAC  Level of Consciousness: awake  Airway & Oxygen Therapy: Patient Spontanous Breathing and Patient connected to face mask oxygen  Post-op Assessment: Report given to PACU RN  Post vital signs: Reviewed  Complications: No apparent anesthesia complications

## 2012-05-11 NOTE — Anesthesia Preprocedure Evaluation (Signed)
Anesthesia Evaluation  Patient identified by MRN, date of birth, ID band Patient awake    Reviewed: Allergy & Precautions, H&P , NPO status , Patient's Chart, lab work & pertinent test results  History of Anesthesia Complications Negative for: history of anesthetic complications  Airway Mallampati: I      Dental  (+) Partial Upper   Pulmonary asthma , COPDCurrent Smoker, PE: am cough. breath sounds clear to auscultation        Cardiovascular hypertension, Pt. on medications Rhythm:Regular Rate:Normal     Neuro/Psych PSYCHIATRIC DISORDERS Anxiety Depression    GI/Hepatic GERD-  Medicated,  Endo/Other    Renal/GU      Musculoskeletal   Abdominal   Peds  Hematology   Anesthesia Other Findings   Reproductive/Obstetrics                           Anesthesia Physical Anesthesia Plan  ASA: III  Anesthesia Plan: MAC   Post-op Pain Management:    Induction: Intravenous  Airway Management Planned: Simple Face Mask  Additional Equipment:   Intra-op Plan:   Post-operative Plan:   Informed Consent: I have reviewed the patients History and Physical, chart, labs and discussed the procedure including the risks, benefits and alternatives for the proposed anesthesia with the patient or authorized representative who has indicated his/her understanding and acceptance.     Plan Discussed with:   Anesthesia Plan Comments:         Anesthesia Quick Evaluation

## 2012-05-18 ENCOUNTER — Telehealth: Payer: Self-pay | Admitting: Gastroenterology

## 2012-05-18 NOTE — Telephone Encounter (Signed)
Results faxed to PCP 

## 2012-05-18 NOTE — Telephone Encounter (Addendum)
Please call pt. She had a simple adenoma removed from her colon. HER stomach Bx shows gastritis. HER COLON AND SMALL BOWEL BIOPSIES ARE NORMAL.   CONTINUE BENTYL 10 MG AT NOON AND AT BEDTIME TO HELP WITH DIARRHEA AND ABD CRAMPS. DICYCLOMINE MAY CAUSE CONSTIPATION, DRY EYES/DRY MOUTH, DROWSINESS, AND DIFFICULTY URINATING.  CONTINUE NEXIUM. TAKE 30 MINUTES PRIOR TO BREAKFAST AND SUPPER.  NO ASPIRIN OR IBUPROFEN FOR 1 MONTH. RESTART OCT 25.  AVOID ITEMS THAT TRIGGER GASTRITIS.   FOLLOW A HIGH FIBER/LOW FAT DIET. AVOID ITEMS THAT CAUSE BLOATING.   FOLLOW UP IN 4 MOS.  NEXT TCS IN 10 YEARS.

## 2012-05-19 NOTE — Telephone Encounter (Signed)
Called and informed pt. She said her pharmacy does not have the prescription. Please send to [facility].

## 2012-05-20 MED ORDER — DICYCLOMINE HCL 10 MG PO CAPS: ORAL_CAPSULE | ORAL | Status: DC

## 2012-05-20 NOTE — Addendum Note (Signed)
Addended by: [place] on: 05/20/2012 09:27 AM   Modules accepted: Orders

## 2012-05-20 NOTE — Telephone Encounter (Signed)
LMOM - Rx was sent to pharmacy

## 2012-05-20 NOTE — Telephone Encounter (Signed)
PLEASE CALL PT.  Rx sent.  

## 2012-05-30 NOTE — Progress Notes (Signed)
EGD SEP 2013 GASTRITIS  TCS SEP 2013 NL COLON Bx, SIMPLE ADENOMAS REVIEWED.

## 2012-08-05 ENCOUNTER — Encounter: Payer: Self-pay | Admitting: *Deleted

## 2013-03-21 ENCOUNTER — Emergency Department (HOSPITAL_COMMUNITY)
Admission: EM | Admit: 2013-03-21 | Discharge: 2013-03-21 | Disposition: A | Discharge status: Home / Self Care | Payer: Medicaid Other | Attending: Emergency Medicine | Admitting: Emergency Medicine

## 2013-03-21 ENCOUNTER — Encounter (HOSPITAL_COMMUNITY): Payer: Self-pay

## 2013-03-21 DIAGNOSIS — J45909 Unspecified asthma, uncomplicated: Secondary | ICD-10-CM | POA: Insufficient documentation

## 2013-03-21 DIAGNOSIS — F411 Generalized anxiety disorder: Secondary | ICD-10-CM | POA: Insufficient documentation

## 2013-03-21 DIAGNOSIS — R252 Cramp and spasm: Secondary | ICD-10-CM | POA: Insufficient documentation

## 2013-03-21 DIAGNOSIS — K219 Gastro-esophageal reflux disease without esophagitis: Secondary | ICD-10-CM | POA: Insufficient documentation

## 2013-03-21 DIAGNOSIS — F17200 Nicotine dependence, unspecified, uncomplicated: Secondary | ICD-10-CM | POA: Insufficient documentation

## 2013-03-21 DIAGNOSIS — R5381 Other malaise: Secondary | ICD-10-CM | POA: Insufficient documentation

## 2013-03-21 DIAGNOSIS — IMO0001 Reserved for inherently not codable concepts without codable children: Secondary | ICD-10-CM | POA: Insufficient documentation

## 2013-03-21 DIAGNOSIS — Z79899 Other long term (current) drug therapy: Secondary | ICD-10-CM | POA: Insufficient documentation

## 2013-03-21 DIAGNOSIS — Z8719 Personal history of other diseases of the digestive system: Secondary | ICD-10-CM | POA: Insufficient documentation

## 2013-03-21 DIAGNOSIS — F3289 Other specified depressive episodes: Secondary | ICD-10-CM | POA: Insufficient documentation

## 2013-03-21 DIAGNOSIS — I10 Essential (primary) hypertension: Secondary | ICD-10-CM | POA: Insufficient documentation

## 2013-03-21 DIAGNOSIS — F329 Major depressive disorder, single episode, unspecified: Secondary | ICD-10-CM | POA: Insufficient documentation

## 2013-03-21 DIAGNOSIS — Z8739 Personal history of other diseases of the musculoskeletal system and connective tissue: Secondary | ICD-10-CM | POA: Insufficient documentation

## 2013-03-21 LAB — BASIC METABOLIC PANEL
CO2: 23 mEq/L (ref 19–32)
Chloride: 103 mEq/L (ref 96–112)
Glucose, Bld: 94 mg/dL (ref 70–99)
Potassium: 3.6 mEq/L (ref 3.5–5.1)
Sodium: 139 mEq/L (ref 135–145)

## 2013-03-21 MED ORDER — ACETAMINOPHEN 325 MG PO TABS
650.0000 mg | ORAL_TABLET | Freq: Once | ORAL | Status: AC
Start: 2013-03-21 — End: 2013-03-21
  Administered 2013-03-21: 650 mg via ORAL
  Filled 2013-03-21: qty 2

## 2013-03-21 NOTE — ED Provider Notes (Signed)
CSN: [id number]     Arrival date & time 03/21/13  1749 History  This chart was scribed for Doug Sou, MD by Bennett Scrape, ED Scribe. This patient was seen in room APA05/APA05 and the patient's care was started at 7:14 PM.   First MD Initiated Contact with Patient 03/21/13 1910     Chief Complaint  Patient presents with  . Leg Pain    Patient is a 52 y.o. female presenting with leg pain. The history is provided by the patient. No language interpreter was used.  Leg Pain Location:  Leg Time since incident:  3 days Injury: no   Leg location:  L lower leg and R lower leg Pain details:    Quality:  Cramping   Timing:  Constant   Progression:  Waxing and waning Chronicity:  New Relieved by:  Movement Associated symptoms: no muscle weakness, no numbness and no swelling     HPI Comments: Judy Boyd is a 52 y.o. female who presents to the Emergency Department complaining of 3 days of intermittent bilateral leg cramps that started gradually upon waking. The cramps are typically worse at the beginning of the day and improve with ambulation. She reports that she currently only has pain in the left calf. She reports one prior episode a couple of years ago that resolved on their own. She denies being seen for the same at that time. She also expresses concern over a "knot" in her leg that has gradually improved throughout the day. She reports associated weakness secondary to pain but denies SOB and CP. She has a h/o anxiety and was prescribed xanax but reports that she has not taken any in 3 to 4 months. She is also on Flexeril PRN but denies taking any. She denies smoking, illegal drugs or alcohol use.   PCP is Dr. Felecia Shelling   Past Medical History  Diagnosis Date  . Hypertension   . Asthma   . GERD (gastroesophageal reflux disease)   . Bowel obstruction   . Anxiety   . Depression   . Arthritis    Past Surgical History  Procedure Laterality Date  . Abdominal surgery  2003     blockage  . Uterine ablation    . Left salpingectomy     History reviewed. No pertinent family history. History  Substance Use Topics  . Smoking status: Current Every Day Smoker -- 0.50 packs/day for 10 years    Types: Cigarettes  . Smokeless tobacco: Not on file  . Alcohol Use: Yes     Comment: about 6 pack a week of beer   No OB history provided.  Review of Systems  Constitutional: Negative.   HENT: Negative.   Respiratory: Negative.   Cardiovascular: Negative.   Gastrointestinal: Negative.   Musculoskeletal: Positive for myalgias.  Skin: Negative.   Neurological: Negative.   Psychiatric/Behavioral: Negative.   All other systems reviewed and are negative.    Allergies  Review of patient's allergies indicates no known allergies.  Home Medications   Current Outpatient Rx  Name  Route  Sig  Dispense  Refill  . albuterol (PROVENTIL HFA;VENTOLIN HFA) 108 (90 BASE) MCG/ACT inhaler   Inhalation   Inhale 2 puffs into the lungs every 6 (six) hours as needed. Shortness of breath         . ALPRAZolam (XANAX) 1 MG tablet   Oral   Take 1 mg by mouth 3 (three) times daily as needed. anxiety          .  amLODipine (NORVASC) 10 MG tablet   Oral   Take 10 mg by mouth daily.         . ARIPiprazole (ABILIFY) 10 MG tablet   Oral   Take 10 mg by mouth daily.         . budesonide-formoterol (SYMBICORT) 160-4.5 MCG/ACT inhaler   Inhalation   Inhale 2 puffs into the lungs 2 (two) times daily.           . cyclobenzaprine (FLEXERIL) 10 MG tablet   Oral   Take 10 mg by mouth 3 (three) times daily as needed. Muscle Spasms         . dicyclomine (BENTYL) 10 MG capsule      1 PO 30 MINUTES PRIOR TO LUNCH AND AT BEDTIME   62 capsule   11   . esomeprazole (NEXIUM) 40 MG capsule   Oral   Take 40 mg by mouth 2 (two) times daily.          Marland Kitchen estradiol (ESTRACE) 1 MG tablet   Oral   Take 1 mg by mouth daily.         Marland Kitchen FLUoxetine (PROZAC) 20 MG capsule   Oral    Take 20 mg by mouth daily.         Marland Kitchen HYDROcodone-acetaminophen (NORCO) 7.5-325 MG per tablet   Oral   Take 1 tablet by mouth every 6 (six) hours as needed. pain         . lisinopril-hydrochlorothiazide (PRINZIDE,ZESTORETIC) 20-25 MG per tablet   Oral   Take 1 tablet by mouth daily.         Marland Kitchen loratadine (CLARITIN) 10 MG tablet   Oral   Take 10 mg by mouth daily.         . montelukast (SINGULAIR) 10 MG tablet   Oral   Take 10 mg by mouth at bedtime.           . ondansetron (ZOFRAN) 4 MG tablet   Oral   Take 4 mg by mouth every 8 (eight) hours as needed. Nausea and Vomiting         . promethazine-codeine (PHENERGAN WITH CODEINE) 6.25-10 MG/5ML syrup   Oral   Take 5 mLs by mouth every 4 (four) hours as needed for cough.   120 mL   0    Triage Vitals: BP 153/101  Pulse 90  Temp(Src) 98.9 F (37.2 C) (Oral)  Resp 20  Ht 5\' 7"  (1.702 m)  Wt 140 lb (63.504 kg)  BMI 21.92 kg/m2  SpO2 100%  Physical Exam  Nursing note and vitals reviewed. Constitutional: She appears well-developed and well-nourished.  HENT:  Head: Normocephalic and atraumatic.  Eyes: Conjunctivae are normal. Pupils are equal, round, and reactive to light.  Neck: Neck supple. No tracheal deviation present. No thyromegaly present.  Cardiovascular: Normal rate and regular rhythm.   No murmur heard. Pulmonary/Chest: Effort normal and breath sounds normal.  Abdominal: Soft. Bowel sounds are normal. She exhibits no distension. There is no tenderness.  Musculoskeletal: Normal range of motion. She exhibits no edema (no calf edema) and no tenderness (no calf tenderness).  Neurological: She is alert. Coordination normal.  Gait normal  Skin: Skin is warm and dry. No rash noted.  Psychiatric: She has a normal mood and affect.    ED Course   Procedures (including critical care time)  Medications  acetaminophen (TYLENOL) tablet 650 mg (650 mg Oral Given 03/21/13 1923)    DIAGNOSTIC STUDIES: Oxygen  Saturation  is 100% on room air, normal by my interpretation.    COORDINATION OF CARE: 7:17 PM-Discussed treatment plan which includes BMP with pt at bedside and pt agreed to plan. Pt is driving herself. Advised pt that she will not be receiving narcotics and pt verbalized her understanding.  Labs Reviewed  BASIC METABOLIC PANEL   No results found. No diagnosis found. 9:25 PM reports some relief after treatment with Tylenol. Results for orders placed during the hospital encounter of 03/21/13  BASIC METABOLIC PANEL      Result Value Range   Sodium 139  135 - 145 mEq/L   Potassium 3.6  3.5 - 5.1 mEq/L   Chloride 103  96 - 112 mEq/L   CO2 23  19 - 32 mEq/L   Glucose, Bld 94  70 - 99 mg/dL   BUN 15  6 - 23 mg/dL   Creatinine, Ser 9.60  0.50 - 1.10 mg/dL   Calcium 9.8  8.4 - 45.4 mg/dL   GFR calc non Af Amer >90  >90 mL/min   GFR calc Af Amer >90  >90 mL/min   No results found.  MDM  Plan patient has prescriptions for Xanax and Flexeril at home, both of which are muscle relaxers. She is advised her blood pressure recheck within one week. Follow up with Dr.Fanta Diagnosis #1 muscle cramps #2 hypertension  Doug Sou, MD 03/21/13 2127

## 2013-03-21 NOTE — ED Notes (Signed)
Pt says she awakened Saturday with cramps in both legs. Stood up and felt better , but cont to have weakness in both legs, esp the left. And says that last night there was a "knot" in her leg, Felt weakness in leg.also

## 2013-03-21 NOTE — ED Notes (Signed)
Patient in room and asking for her iv to be removed because she was not receiving anymore than tylenol for her pain.dr Judy Boyd just lef her t room and informed her that her labs were all normal.

## 2013-03-21 NOTE — ED Notes (Signed)
Pt c/o leg pain and weakness that started the previous Saturday.

## 2013-08-15 ENCOUNTER — Telehealth: Payer: Self-pay | Admitting: Family Medicine

## 2013-08-15 NOTE — Telephone Encounter (Signed)
Entered in error

## 2014-01-23 ENCOUNTER — Other Ambulatory Visit (HOSPITAL_COMMUNITY): Payer: Self-pay | Admitting: Family Medicine

## 2014-01-23 DIAGNOSIS — Z1231 Encounter for screening mammogram for malignant neoplasm of breast: Secondary | ICD-10-CM

## 2014-01-24 ENCOUNTER — Emergency Department (HOSPITAL_COMMUNITY)
Admission: EM | Admit: 2014-01-24 | Discharge: 2014-01-24 | Disposition: A | Discharge status: Home / Self Care | Payer: Medicaid Other | Attending: Emergency Medicine | Admitting: Emergency Medicine

## 2014-01-24 ENCOUNTER — Encounter (HOSPITAL_COMMUNITY): Payer: Self-pay | Admitting: Emergency Medicine

## 2014-01-24 ENCOUNTER — Emergency Department (HOSPITAL_COMMUNITY): Payer: Medicaid Other

## 2014-01-24 DIAGNOSIS — E860 Dehydration: Secondary | ICD-10-CM

## 2014-01-24 DIAGNOSIS — R197 Diarrhea, unspecified: Secondary | ICD-10-CM | POA: Insufficient documentation

## 2014-01-24 DIAGNOSIS — F17200 Nicotine dependence, unspecified, uncomplicated: Secondary | ICD-10-CM | POA: Insufficient documentation

## 2014-01-24 DIAGNOSIS — Z8719 Personal history of other diseases of the digestive system: Secondary | ICD-10-CM | POA: Insufficient documentation

## 2014-01-24 DIAGNOSIS — Z79899 Other long term (current) drug therapy: Secondary | ICD-10-CM | POA: Insufficient documentation

## 2014-01-24 DIAGNOSIS — R079 Chest pain, unspecified: Secondary | ICD-10-CM | POA: Insufficient documentation

## 2014-01-24 DIAGNOSIS — F3289 Other specified depressive episodes: Secondary | ICD-10-CM | POA: Insufficient documentation

## 2014-01-24 DIAGNOSIS — R109 Unspecified abdominal pain: Secondary | ICD-10-CM | POA: Insufficient documentation

## 2014-01-24 DIAGNOSIS — F329 Major depressive disorder, single episode, unspecified: Secondary | ICD-10-CM | POA: Insufficient documentation

## 2014-01-24 DIAGNOSIS — J45909 Unspecified asthma, uncomplicated: Secondary | ICD-10-CM | POA: Insufficient documentation

## 2014-01-24 DIAGNOSIS — I959 Hypotension, unspecified: Secondary | ICD-10-CM | POA: Insufficient documentation

## 2014-01-24 DIAGNOSIS — R9431 Abnormal electrocardiogram [ECG] [EKG]: Secondary | ICD-10-CM | POA: Insufficient documentation

## 2014-01-24 DIAGNOSIS — I10 Essential (primary) hypertension: Secondary | ICD-10-CM | POA: Insufficient documentation

## 2014-01-24 DIAGNOSIS — F411 Generalized anxiety disorder: Secondary | ICD-10-CM | POA: Insufficient documentation

## 2014-01-24 DIAGNOSIS — IMO0002 Reserved for concepts with insufficient information to code with codable children: Secondary | ICD-10-CM | POA: Insufficient documentation

## 2014-01-24 DIAGNOSIS — M129 Arthropathy, unspecified: Secondary | ICD-10-CM | POA: Insufficient documentation

## 2014-01-24 LAB — BASIC METABOLIC PANEL
BUN: 24 mg/dL — ABNORMAL HIGH (ref 6–23)
CALCIUM: 9.2 mg/dL (ref 8.4–10.5)
CO2: 23 meq/L (ref 19–32)
CREATININE: 1.79 mg/dL — AB (ref 0.50–1.10)
Chloride: 95 mEq/L — ABNORMAL LOW (ref 96–112)
GFR calc Af Amer: 37 mL/min — ABNORMAL LOW (ref >90)
GFR, EST NON AFRICAN AMERICAN: 32 mL/min — AB (ref >90)
Glucose, Bld: 114 mg/dL — ABNORMAL HIGH (ref 70–99)
Potassium: 3.9 mEq/L (ref 3.7–5.3)
SODIUM: 133 meq/L — AB (ref 137–147)

## 2014-01-24 LAB — CBC WITH DIFFERENTIAL/PLATELET
BASOS ABS: 0 10*3/uL (ref 0.0–0.1)
BASOS PCT: 0 % (ref 0–1)
EOS PCT: 2 % (ref 0–5)
Eosinophils Absolute: 0.2 10*3/uL (ref 0.0–0.7)
HEMATOCRIT: 39.5 % (ref 36.0–46.0)
Hemoglobin: 13.5 g/dL (ref 12.0–15.0)
LYMPHS PCT: 39 % (ref 12–46)
Lymphs Abs: 3.7 10*3/uL (ref 0.7–4.0)
MCH: 29.9 pg (ref 26.0–34.0)
MCHC: 34.2 g/dL (ref 30.0–36.0)
MCV: 87.6 fL (ref 78.0–100.0)
MONO ABS: 0.8 10*3/uL (ref 0.1–1.0)
Monocytes Relative: 9 % (ref 3–12)
NEUTROS ABS: 4.9 10*3/uL (ref 1.7–7.7)
Neutrophils Relative %: 51 % (ref 43–77)
PLATELETS: 293 10*3/uL (ref 150–400)
RBC: 4.51 MIL/uL (ref 3.87–5.11)
RDW: 12.2 % (ref 11.5–15.5)
WBC: 9.6 10*3/uL (ref 4.0–10.5)

## 2014-01-24 LAB — TROPONIN I

## 2014-01-24 MED ORDER — SODIUM CHLORIDE 0.9 % IV BOLUS (SEPSIS)
1000.0000 mL | Freq: Once | INTRAVENOUS | Status: AC
  Administered 2014-01-24: 1000 mL via INTRAVENOUS

## 2014-01-24 NOTE — ED Notes (Signed)
Patient A&O, nad, IV removed and bandaid placed. Pt able to ambulate without difficulties, gait steady. Follow up reviewed for recheck in 2 days for BP meds and kidney function. Pt acknowledged understanding.

## 2014-01-24 NOTE — Discharge Instructions (Signed)
Dehydration, Adult Dehydration means your body does not have as much fluid as it needs. Your kidneys, brain, and heart will not work properly without the right amount of fluids and salt.  HOME CARE  Ask your doctor how to replace body fluid losses (rehydrate).  Drink enough fluids to keep your pee (urine) clear or pale yellow.  Drink small amounts of fluids often if you feel sick to your stomach (nauseous) or throw up (vomit).  Eat like you normally do.  Avoid:  Foods or drinks high in sugar.  Bubbly (carbonated) drinks.  Juice.  Very hot or cold fluids.  Drinks with caffeine.  Fatty, greasy foods.  Alcohol.  Tobacco.  Eating too much.  Gelatin desserts.  Wash your hands to avoid spreading germs (bacteria, viruses).  Only take medicine as told by your doctor.  Keep all doctor visits as told. GET HELP RIGHT AWAY IF:   You cannot drink something without throwing up.  You get worse even with treatment.  Your vomit has blood in it or looks greenish.  Your poop (stool) has blood in it or looks black and tarry.  You have not peed in 6 to 8 hours.  You pee a small amount of very dark pee.  You have a fever.  You pass out (faint).  You have belly (abdominal) pain that gets worse or stays in one spot (localizes).  You have a rash, stiff neck, or bad headache.  You get easily annoyed, sleepy, or are hard to wake up.  You feel weak, dizzy, or very thirsty. MAKE SURE YOU:   Understand these instructions.  Will watch your condition.  Will get help right away if you are not doing well or get worse. Document Released: 05/31/2009 Document Revised: 10/27/2011 Document Reviewed: 03/24/2011 [facility] Patient Information 2014 [place], [place].  Hypotension As your heart beats, it forces blood through your body. This force is called blood pressure. If you have hypotension, you have low blood pressure. When your blood pressure is too low, you may not get enough  blood to your brain. You may feel weak, feel lightheaded, have a fast heartbeat, or even pass out (faint). HOME CARE  Drink enough fluids to keep your pee (urine) clear or pale yellow.  Take all medicines as told by your doctor.  Get up slowly after sitting or lying down.  Wear support stockings as told by your doctor.  Maintain a healthy diet by including foods such as fruits, vegetables, nuts, whole grains, and lean meats. GET HELP IF:  You are throwing up (vomiting) or have watery poop (diarrhea).  You have a fever for more than 2 3 days.  You feel more thirsty than usual.  You feel weak and tired. GET HELP RIGHT AWAY IF:   You pass out (faint).  You have chest pain or a fast or irregular heartbeat.  You lose feeling in part of your body.  You cannot move your arms or legs.  You have trouble speaking.  You get sweaty or feel lightheaded. MAKE SURE YOU:   Understand these instructions.  Will watch your condition.  Will get help right away if you are not doing well or get worse. Document Released: 10/29/2009 Document Revised: 04/06/2013 Document Reviewed: 02/04/2013 [facility] Patient Information 2014 [place].  Do not take either of your lisinopril medications until you see your Dr. for recheck in 2 days as discussed.  Continue taking your amlodipine however for your blood pressure.  Make sure you're drinking plenty  of fluids.

## 2014-01-24 NOTE — ED Notes (Signed)
Pt states started new bp med, lisinopril Thursday 01/20/14. Started having abd and leg cramping/diarrhea and dizziness Saturday. Mm moist. Dizziness with movement, none now per pt. Nad.

## 2014-01-24 NOTE — ED Provider Notes (Signed)
  Face-to-face evaluation   History: She complains of cramping in abdomen, back, legs and arms, for 2 days. The discomfort, occasionally radiates to her chest. She denies cough, shortness of breath, nausea, or vomiting. She has had several episodes of diarrhea. Her PCP added additional blood pressure medicine lisinopril 20 mg, to her current lisinopril and hydrochlorothiazide treatment. She also was started on Prozac. The diarrhea occurred after starting these medications.  Physical exam: Alert, calm, cooperative. Chest nontender to palpation. Heart regular rhythm, no murmur. Lungs clear to auscultation. Abdomen soft and nontender  Assessment diarrhea possibly secondary to Prozac. Relative hypotension with initiation of new treatment. Abnormal EKG the only followup as an outpatient. No evidence for ACS on initial evaluation today  Medical screening examination/treatment/procedure(s) were conducted as a shared visit with non-physician practitioner(s) and myself.  I personally evaluated the patient during the encounter  Richarda Blade, MD 01/27/14 2320

## 2014-01-25 NOTE — ED Provider Notes (Signed)
CSN: [id number]     Arrival date & time 01/24/14  4696 History   First MD Initiated Contact with Patient 01/24/14 [phone]     Chief Complaint  Patient presents with  . Diarrhea  . Dizziness     (Consider location/radiation/quality/duration/timing/severity/associated sxs/prior Treatment) HPI Comments: Judy Boyd is a 53 y.o. Female with a history significant for HTN asthma and GERD presenting with abdominal and lower leg cramping along with watery, non bloody stools on average twice daily for the past 3 days.  She also describes feelings of lightheadedness, particularly with sitting or standing too quickly.  She reports the cramping occasionally radiates to her chest, last having this symptom yesterday.  She denies vertigo, shortness of breath, nausea, vomiting, headache, fevers or chills.  She has found no alleviators for her symtpoms besides rest.  Her pcp increased her bp medication 2 days before the symptoms began,  Adding an additional lisinopril 20 mg tablet to the lisinopril 20/hctz 25 mg she is already taking as her systolic bp during this visit was mid 160's.  Additionally she was placed back on prozac this same day, a medicine that caused diarrhea the last time she tried to take.     The history is provided by the patient.    Past Medical History  Diagnosis Date  . Hypertension   . Asthma   . GERD (gastroesophageal reflux disease)   . Bowel obstruction   . Anxiety   . Depression   . Arthritis    Past Surgical History  Procedure Laterality Date  . Abdominal surgery  2003    blockage  . Uterine ablation    . Left salpingectomy     History reviewed. No pertinent family history. History  Substance Use Topics  . Smoking status: Current Every Day Smoker -- 0.50 packs/day for 10 years    Types: Cigarettes  . Smokeless tobacco: Not on file  . Alcohol Use: Yes     Comment: 2 beers per week   OB History   Grav Para Term Preterm Abortions TAB SAB Ect Mult Living            Review of Systems  Constitutional: Negative for fever and chills.  HENT: Negative.   Eyes: Negative.   Respiratory: Negative for chest tightness and shortness of breath.   Cardiovascular: Positive for chest pain. Negative for leg swelling.  Gastrointestinal: Positive for abdominal pain and diarrhea. Negative for nausea and vomiting.  Genitourinary: Negative.   Musculoskeletal: Negative for arthralgias and joint swelling.  Skin: Negative for rash.  Neurological: Positive for weakness and light-headedness. Negative for numbness and headaches.  Psychiatric/Behavioral: Negative.       Allergies  Review of patient's allergies indicates no known allergies.  Home Medications   Prior to Admission medications   Medication Sig Start Date End Date Taking? Authorizing Provider  albuterol (PROVENTIL HFA;VENTOLIN HFA) 108 (90 BASE) MCG/ACT inhaler Inhale 2 puffs into the lungs every 6 (six) hours as needed. Shortness of breath   Yes Historical Provider, MD  ALPRAZolam (XANAX) 1 MG tablet Take 1 mg by mouth 3 (three) times daily as needed. anxiety    Yes Historical Provider, MD  amLODipine (NORVASC) 10 MG tablet Take 10 mg by mouth daily.   Yes Historical Provider, MD  budesonide-formoterol (SYMBICORT) 160-4.5 MCG/ACT inhaler Inhale 2 puffs into the lungs 2 (two) times daily.    Yes Historical Provider, MD  FLUoxetine (PROZAC) 20 MG capsule Take 20 mg by mouth daily.  Yes Historical Provider, MD  HYDROcodone-acetaminophen (NORCO) 7.5-325 MG per tablet Take 1 tablet by mouth every 6 (six) hours as needed. pain   Yes Historical Provider, MD  lisinopril (PRINIVIL,ZESTRIL) 20 MG tablet Take 20 mg by mouth daily. Takes with lisinopril hctz   Yes Historical Provider, MD  lisinopril-hydrochlorothiazide (PRINZIDE,ZESTORETIC) 20-25 MG per tablet Take 1 tablet by mouth every morning.    Yes Historical Provider, MD  loratadine (CLARITIN) 10 MG tablet Take 10 mg by mouth daily as needed for  allergies.    Yes Historical Provider, MD  montelukast (SINGULAIR) 10 MG tablet Take 10 mg by mouth at bedtime.     Yes Historical Provider, MD  cyclobenzaprine (FLEXERIL) 10 MG tablet Take 10 mg by mouth 3 (three) times daily as needed. Muscle Spasms    Historical Provider, MD   BP 119/84  Pulse 63  Temp(Src) 98 F (36.7 C) (Oral)  Resp 20  Wt 133 lb (60.328 kg)  SpO2 99% Physical Exam  Nursing note and vitals reviewed. Constitutional: She is oriented to person, place, and time. She appears well-developed and well-nourished.  HENT:  Head: Normocephalic and atraumatic.  Eyes: Conjunctivae and EOM are normal. Right eye exhibits no nystagmus. Left eye exhibits no nystagmus.  Neck: Normal range of motion.  Cardiovascular: Normal rate, regular rhythm and normal heart sounds.   Pulmonary/Chest: Effort normal and breath sounds normal. She has no wheezes.  Abdominal: Soft. Bowel sounds are normal. She exhibits no distension. There is no tenderness. There is no rebound and no guarding.  Musculoskeletal: Normal range of motion.  Neurological: She is alert and oriented to person, place, and time. She has normal strength. No cranial nerve deficit or sensory deficit.  Skin: Skin is warm and dry.  Psychiatric: She has a normal mood and affect.    ED Course  Procedures (including critical care time) Labs Review Labs Reviewed  BASIC METABOLIC PANEL - Abnormal; Notable for the following:    Sodium 133 (*)    Chloride 95 (*)    Glucose, Bld 114 (*)    BUN 24 (*)    Creatinine, Ser 1.79 (*)    GFR calc non Af Amer 32 (*)    GFR calc Af Amer 37 (*)    All other components within normal limits  TROPONIN I  CBC WITH DIFFERENTIAL    Imaging Review Dg Chest 2 View  01/24/2014   CLINICAL DATA:  Diarrhea.  Dizziness.  Weakness.  EXAM: CHEST  2 VIEW  COMPARISON:  05/06/2012.  FINDINGS: Cardiopericardial silhouette within normal limits. Mediastinal contours normal. Trachea midline. No airspace  disease or effusion. Monitoring leads project over the chest. Flattening of the hemidiaphragms is present on the lateral view compatible with emphysema.  IMPRESSION: Emphysema without acute cardiopulmonary disease.   Electronically Signed   By: Dereck Ligas M.D.   On: 01/24/2014 09:44     EKG Interpretation   Date/Time:  Tuesday January 24 2014 08:40:05 EDT Ventricular Rate:  84 PR Interval:  162 QRS Duration: 75 QT Interval:  383 QTC Calculation: 453 R Axis:   64 Text Interpretation:  Sinus arrhythmia Biatrial enlargement Borderline T  wave abnormalities Since last tracing EKG is now abnormal with  T wave  abnormality, nonspecific Confirmed by Eulis Foster  MD, ELLIOTT ([id number]) on  01/24/2014 8:57:28 AM      MDM   Final diagnoses:  Hypotension  Dehydration  Abnormal EKG    Patients labs and/or radiological studies were viewed and considered during  the medical decision making and disposition process. Pt's sx appear related to hypotension associated with new bp medicine.  Additionally, creatinine elevated, advised to dc lisinopril until she has f/u with pcp, encouraged recheck in 2 days.  Also advised discussion with pcp regarding other alternatives to prozac,  Suspected as source of diarrhea. Change in ekg - cardiology referral given and discussed the reason for this with patient which she understands and will call for appt.  She was given IV fluids and felt improved at time of dc.  bp stable upon recheck at dc.  The patient appears reasonably screened and/or stabilized for discharge and I doubt any other medical condition or other [facility] requiring further screening, evaluation, or treatment in the ED at this time prior to discharge.     Evalee Jefferson, PA-C 01/25/14 1248

## 2014-02-06 ENCOUNTER — Ambulatory Visit (HOSPITAL_COMMUNITY)
Admission: RE | Admit: 2014-02-06 | Discharge: 2014-02-06 | Disposition: A | Discharge status: Home / Self Care | Payer: Medicaid Other | Source: Ambulatory Visit | Attending: Family Medicine | Admitting: Family Medicine

## 2014-02-06 DIAGNOSIS — Z1231 Encounter for screening mammogram for malignant neoplasm of breast: Secondary | ICD-10-CM

## 2014-02-08 ENCOUNTER — Ambulatory Visit: Payer: Medicaid Other | Admitting: Cardiology

## 2014-02-10 ENCOUNTER — Encounter: Payer: Self-pay | Admitting: Internal Medicine

## 2014-02-10 ENCOUNTER — Ambulatory Visit (INDEPENDENT_AMBULATORY_CARE_PROVIDER_SITE_OTHER): Payer: Medicaid Other | Admitting: Internal Medicine

## 2014-02-10 VITALS — BP 144/86 | HR 73 | Ht 66.0 in | Wt 139.0 lb

## 2014-02-10 DIAGNOSIS — R0789 Other chest pain: Secondary | ICD-10-CM

## 2014-02-10 DIAGNOSIS — I739 Peripheral vascular disease, unspecified: Secondary | ICD-10-CM

## 2014-02-10 MED ORDER — NICOTINE 14 MG/24HR TD PT24: 14.0000 mg | MEDICATED_PATCH | Freq: Every day | TRANSDERMAL | Status: DC

## 2014-02-10 MED ORDER — LISINOPRIL 10 MG PO TABS: 5.0000 mg | ORAL_TABLET | Freq: Every day | ORAL | Status: DC

## 2014-02-10 NOTE — Patient Instructions (Signed)
Your physician recommends that you schedule a follow-up appointment in: to be determined after tests   Your physician has recommended you make the following change in your medication:      Start lisinopril 10 mg - take  1/2 tablet (5 mg) daily    Your physician has requested that you have en exercise stress myoview. For further information please visit [email or web address]. Please follow instruction sheet, as given.    Your physician has requested that you have a lower or upper extremity venous duplex. This test is an ultrasound of the veins in the legs or arms. It looks at venous blood flow that carries blood from the heart to the legs or arms. Allow one hour for a Lower Venous exam. Allow thirty minutes for an Upper Venous exam. There are no restrictions or special instructions.   Thank you for choosing [facility] !

## 2014-02-10 NOTE — Progress Notes (Signed)
HPI Patient is a 53 yo who was referred for evaluation of CP  She was seen in ER on 6/9.  At that time she complaine of some abdominal and leg gramping  DId have water bowel movements. Also complained of lightt headedness at times.  Also  Not that lisinopril 20 had been added to lisinopril HCTZ   In ER she was told to stop   She has stopped both and continued with norvasc She aslos complained of having chest tightness.  Patient says sometimes has no energy  Pressure in chest   Occurs 2 to 3 in 3 wk period.    Usu occurs when moving at time  Has resp issues (asthma)  She has  Ascribed chest tightness to that. [place]  Working on getting disability.   Had to slow with housecleaning.   [employer] sales engineer    Mom had heart problems in her 73s  Dad had heart problems in his 39s  Was a smoker.   No Known Allergies  Current Outpatient Prescriptions  Medication Sig Dispense Refill  . albuterol (PROVENTIL HFA;VENTOLIN HFA) 108 (90 BASE) MCG/ACT inhaler Inhale 2 puffs into the lungs every 6 (six) hours as needed. Shortness of breath      . ALPRAZolam (XANAX) 1 MG tablet Take 1 mg by mouth 3 (three) times daily as needed. anxiety       . amLODipine (NORVASC) 10 MG tablet Take 10 mg by mouth daily.      . budesonide-formoterol (SYMBICORT) 160-4.5 MCG/ACT inhaler Inhale 2 puffs into the lungs 2 (two) times daily.       . cyclobenzaprine (FLEXERIL) 10 MG tablet Take 10 mg by mouth 3 (three) times daily as needed. Muscle Spasms      . FLUoxetine (PROZAC) 20 MG capsule Take 20 mg by mouth daily.      Marland Kitchen HYDROcodone-acetaminophen (NORCO) 7.5-325 MG per tablet Take 1 tablet by mouth every 6 (six) hours as needed. pain      . loratadine (CLARITIN) 10 MG tablet Take 10 mg by mouth daily as needed for allergies.       . montelukast (SINGULAIR) 10 MG tablet Take 10 mg by mouth at bedtime.         No current facility-administered medications for this visit.    Past Medical History   Diagnosis Date  . Hypertension   . Asthma   . GERD (gastroesophageal reflux disease)   . Bowel obstruction   . Anxiety   . Depression   . Arthritis     Past Surgical History  Procedure Laterality Date  . Abdominal surgery  2003    blockage  . Uterine ablation    . Left salpingectomy      No family history on file.  History   Social History  . Marital Status: Single    Spouse Name: N/A    Number of Children: 2  . Years of Education: N/A   Occupational History  . unemployed    Social History Main Topics  . Smoking status: Current Every Day Smoker -- 0.50 packs/day for 10 years    Types: Cigarettes  . Smokeless tobacco: Not on file  . Alcohol Use: Yes     Comment: 2 beers per week  . Drug Use: No  . Sexual Activity: Yes    Birth Control/ Protection: None   Other Topics Concern  . Not on file   Social History Narrative  . No narrative on file  Review of Systems:  All systems reviewed.  They are negative to the above problem except as previously stated.  Vital Signs: BP 144/86  Pulse 73  Ht 5\' 6"  (1.676 m)  Wt 139 lb (63.05 kg)  BMI 22.45 kg/m2  Physical Exam [place] is in NAD HEENT:  Normocephalic, atraumatic. EOMI, PERRLA.  Neck: JVP is normal.  No bruits.  Lungs: Moving air  Mild rhonchi  Mild wheeze with forced expiration Heart: Regular rate and rhythm. Normal S1, S2. No S3.   No significant murmurs. PMI not displaced.  Abdomen:  Supple, nontender. Normal bowel sounds. No masses. No hepatomegaly.  Extremities:  1+DP R  2+ L DP  . No lower extremity edema.  Musculoskeletal :moving all extremities.  Neuro:   alert and oriented x3.  CN II-XII grossly intact.  EKG  01/24/14:  SR  T wave inversion II, III, V4, V5.  Different form previous EKG Jan 2013 Assessment and Plan:  1.  Chest tightness.  Patinet has intermitt chst tightness, SOB  Concerning  T wave changes may be nonspecific but need to evaluate Would set up for Stress MIBI    She needs to  stop smoking  Keep on inhaleers  May be pulm in origin  2.  Claudication  Pulse appears decrese on R  Check LE dopplers.  3.  Tob  Counselled on quitting  Needs to quit  Will rx nicotine pach  4.  HTN  I would add back lisinopril 1/2 tab (10 mg)  5.  Lipids  WIll check with primary care office  She says high.

## 2014-02-20 ENCOUNTER — Encounter (HOSPITAL_COMMUNITY): Payer: Self-pay

## 2014-02-20 ENCOUNTER — Encounter (HOSPITAL_COMMUNITY)
Admission: RE | Admit: 2014-02-20 | Discharge: 2014-02-20 | Disposition: A | Discharge status: Home / Self Care | Payer: Medicaid Other | Source: Ambulatory Visit | Attending: Internal Medicine | Admitting: Internal Medicine

## 2014-02-20 ENCOUNTER — Other Ambulatory Visit: Payer: Self-pay

## 2014-02-20 ENCOUNTER — Ambulatory Visit (HOSPITAL_COMMUNITY): Admission: RE | Admit: 2014-02-20 | Payer: Medicaid Other | Source: Ambulatory Visit

## 2014-02-20 ENCOUNTER — Ambulatory Visit (HOSPITAL_COMMUNITY)
Admission: RE | Admit: 2014-02-20 | Discharge: 2014-02-20 | Disposition: A | Discharge status: Home / Self Care | Payer: Medicaid Other | Source: Ambulatory Visit | Attending: Internal Medicine | Admitting: Internal Medicine

## 2014-02-20 DIAGNOSIS — R0989 Other specified symptoms and signs involving the circulatory and respiratory systems: Secondary | ICD-10-CM | POA: Insufficient documentation

## 2014-02-20 DIAGNOSIS — R0789 Other chest pain: Secondary | ICD-10-CM

## 2014-02-20 DIAGNOSIS — R9431 Abnormal electrocardiogram [ECG] [EKG]: Secondary | ICD-10-CM | POA: Diagnosis not present

## 2014-02-20 DIAGNOSIS — I10 Essential (primary) hypertension: Secondary | ICD-10-CM | POA: Insufficient documentation

## 2014-02-20 DIAGNOSIS — E785 Hyperlipidemia, unspecified: Secondary | ICD-10-CM | POA: Diagnosis not present

## 2014-02-20 DIAGNOSIS — I739 Peripheral vascular disease, unspecified: Secondary | ICD-10-CM | POA: Insufficient documentation

## 2014-02-20 DIAGNOSIS — R0609 Other forms of dyspnea: Secondary | ICD-10-CM | POA: Insufficient documentation

## 2014-02-20 DIAGNOSIS — R079 Chest pain, unspecified: Secondary | ICD-10-CM | POA: Diagnosis present

## 2014-02-20 MED ORDER — REGADENOSON 0.4 MG/5ML IV SOLN
INTRAVENOUS | Status: AC
  Administered 2014-02-20: 0.4 mg via INTRAVENOUS
  Filled 2014-02-20: qty 5

## 2014-02-20 MED ORDER — TECHNETIUM TC 99M SESTAMIBI - CARDIOLITE
30.0000 | Freq: Once | INTRAVENOUS | Status: AC | PRN
  Administered 2014-02-20: 30 via INTRAVENOUS

## 2014-02-20 MED ORDER — REGADENOSON 0.4 MG/5ML IV SOLN
0.4000 mg | Freq: Once | INTRAVENOUS | Status: AC | PRN
  Administered 2014-02-20: 0.4 mg via INTRAVENOUS

## 2014-02-20 MED ORDER — SODIUM CHLORIDE 0.9 % IJ SOLN
10.0000 mL | INTRAMUSCULAR | Status: DC | PRN
  Administered 2014-02-20: 10 mL via INTRAVENOUS

## 2014-02-20 MED ORDER — SODIUM CHLORIDE 0.9 % IJ SOLN
INTRAMUSCULAR | Status: AC
  Administered 2014-02-20: 10 mL via INTRAVENOUS
  Filled 2014-02-20: qty 10

## 2014-02-20 MED ORDER — TECHNETIUM TC 99M SESTAMIBI GENERIC - CARDIOLITE
10.0000 | Freq: Once | INTRAVENOUS | Status: AC | PRN
  Administered 2014-02-20: 10 via INTRAVENOUS

## 2014-02-20 NOTE — Progress Notes (Signed)
Stress Lab Nurses Notes - Selena Swaminathan 02/20/2014 Reason for doing test: Chest Pain and Dyspnea Type of test: Test Changed unable to finish Treadmill due to pain in both legs. Lexiscan Cardiolite given. Nurse performing test: Gerrit Halls, RN Nuclear Medicine Tech: Redmond Baseman Echo Tech: Not Applicable MD performing test: Koneswaran/K.Purcell Nails NP Family MD: Yevette Edwards NP Test explained and consent signed: Yes.   IV started: 22g jelco, Saline lock flushed, No redness or edema and Saline lock started in radiology Symptoms: pain in both legs and SOB Treatment/Intervention: None Reason test stopped: protocol completed After recovery IV was: Discontinued via X-ray tech and No redness or edema Patient to return to Nuc. Med at :11:45 Patient discharged: Home Patient's Condition upon discharge was: stable Comments: During test BP 142/84 & HR 129.  Recovery BP 140/91 & HR 93.  Symptoms resolved in recovery. Geanie Cooley T

## 2014-02-27 ENCOUNTER — Telehealth: Payer: Self-pay | Admitting: *Deleted

## 2014-02-27 NOTE — Telephone Encounter (Signed)
Advised patient of results.  

## 2014-02-27 NOTE — Telephone Encounter (Signed)
Message copied by Earvin Hansen on Mon Feb 27, 2014  1:06 PM ------      Message from: Dorris Carnes V      Created: Tue Feb 21, 2014  1:20 PM       Normal Lexiscan myoview.      No evidence for ischemia. ------

## 2014-02-27 NOTE — Telephone Encounter (Signed)
Message copied by Earvin Hansen on Mon Feb 27, 2014  1:06 PM ------      Message from: Dorris Carnes V      Created: Tue Feb 21, 2014  1:25 PM       Blood flow in legs is normal. ------

## 2014-02-28 ENCOUNTER — Telehealth: Payer: Self-pay | Admitting: Internal Medicine

## 2014-02-28 NOTE — Telephone Encounter (Signed)
Pt states that her Stress test result was normal. She would like to know if she needs to keep her appointment with Dr. Harrington Challenger for the 20 th. According to Dr Harrington Challenger  last office note  She states that the F/U visit will be determine on the stress test result. O/V was cancelled. Pt verbalized understanding.

## 2014-02-28 NOTE — Telephone Encounter (Signed)
New message          Pt would like to know if she needs to come to appt on 20th even though she already has results

## 2014-03-06 ENCOUNTER — Ambulatory Visit: Payer: Medicaid Other | Admitting: Internal Medicine

## 2014-04-10 ENCOUNTER — Telehealth: Payer: Self-pay | Admitting: Family Medicine

## 2014-04-10 NOTE — Telephone Encounter (Signed)
Error

## 2014-04-20 IMAGING — CT CT ABD-PELV W/ CM
2 of 4 series · 16 of 46 positions shown, 18 images · IV contrast (Omnipaque 300)
Comparison: None

CLINICAL DATA: Abdominal pain and bloating

CT ABDOMEN AND PELVIS WITH CONTRAST
TECHNIQUE: Multidetector CT imaging of the abdomen and pelvis was
performed following the standard protocol during bolus
administration of intravenous contrast.
Contrast: 100mL OMNIPAQUE IOHEXOL 300 MG/ML  SOLN

[Series 2: abd_pel_with 5.0 b40f · axial · 0.62mm/px · z∈[-437,-57]mm · 13 of 85 slices shown, 15 images]
[im 5/85  soft-tissue]
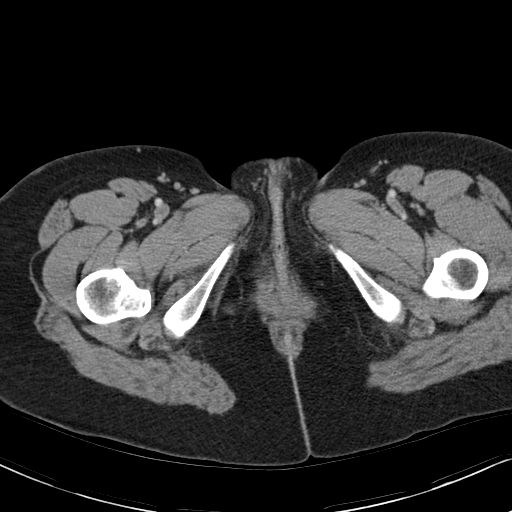
[im 5/85  bone]
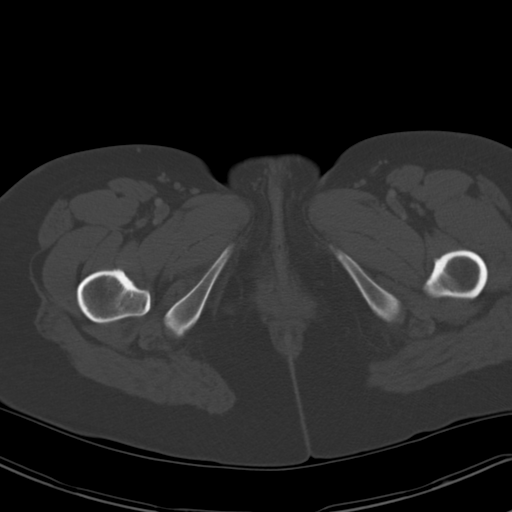
[im 13/85  soft-tissue]
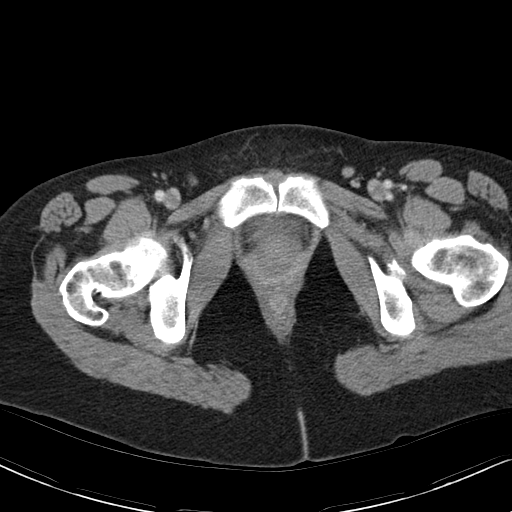
[im 17/85  soft-tissue]
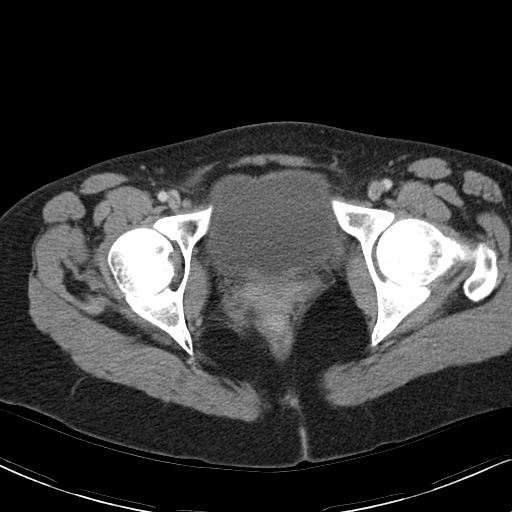
[im 25/85  soft-tissue]
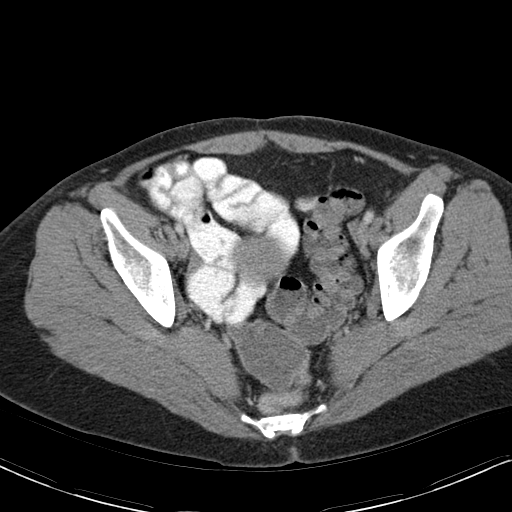
[im 29/85  soft-tissue]
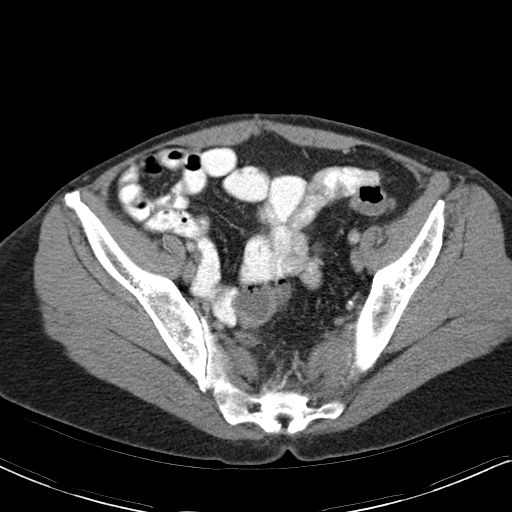
[im 37/85  soft-tissue]
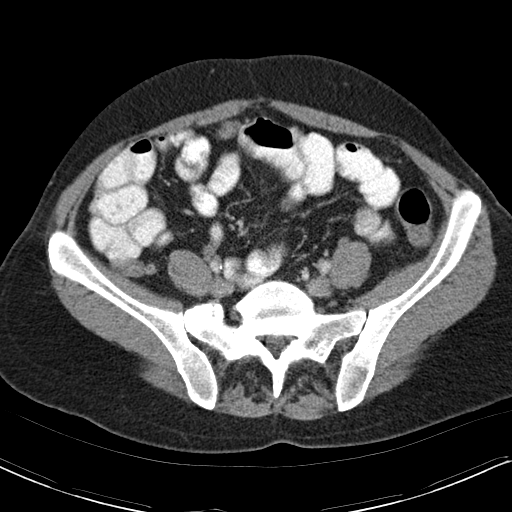
[im 45/85  soft-tissue]
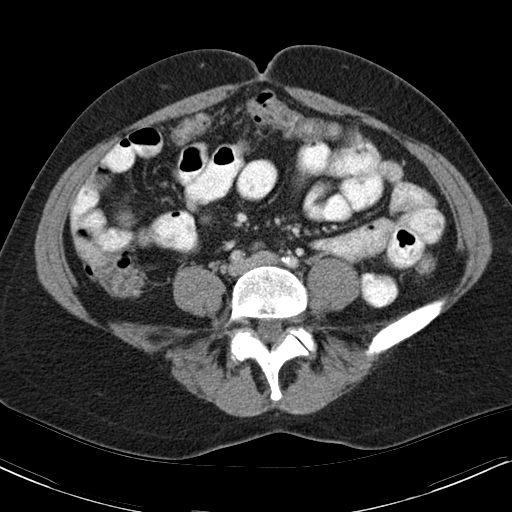
[im 49/85  soft-tissue]
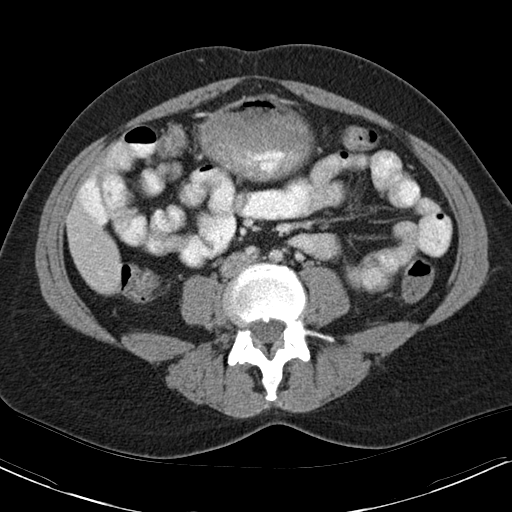
[im 57/85  soft-tissue]
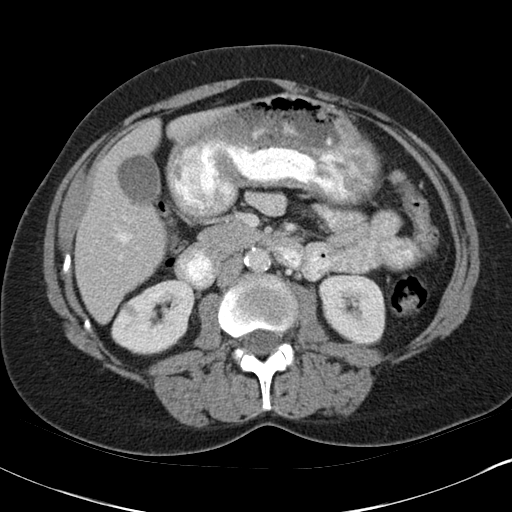
[im 57/85  bone]
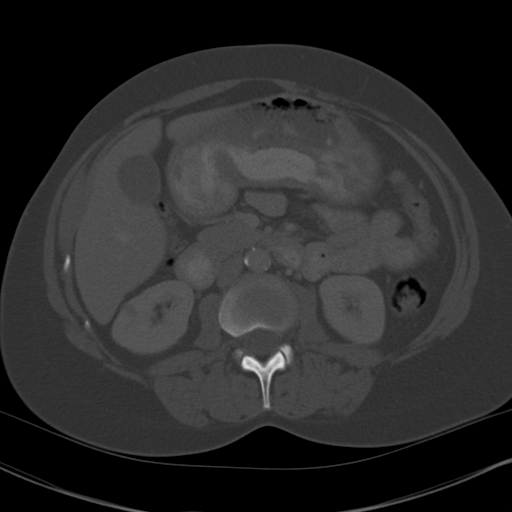
[im 61/85  soft-tissue]
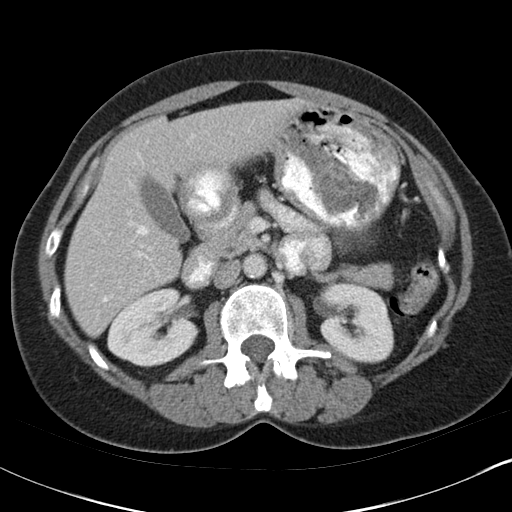
[im 69/85  soft-tissue]
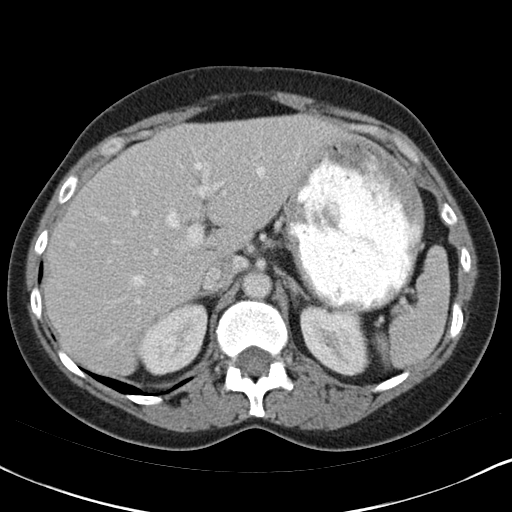
[im 73/85  soft-tissue]
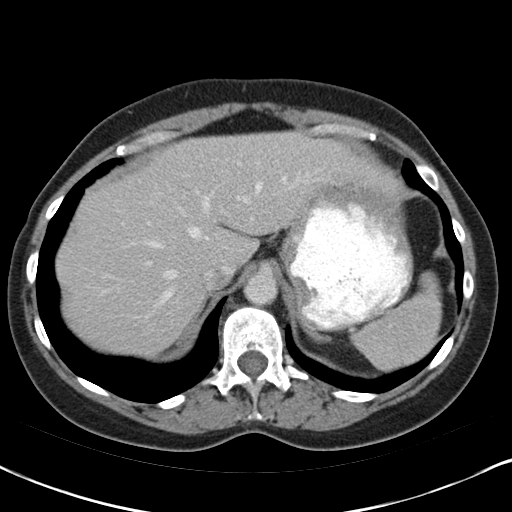
[im 81/85  soft-tissue]
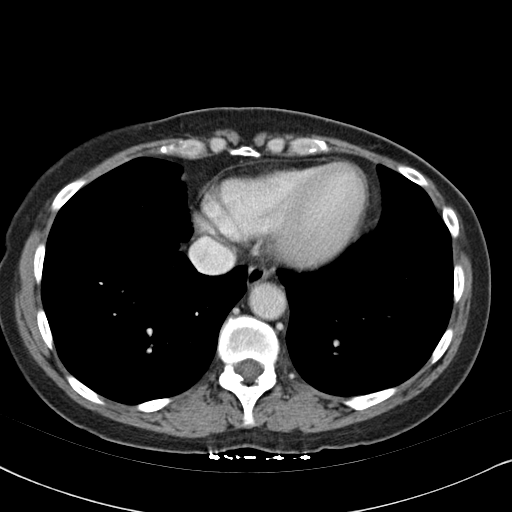

[Series 4: abd_pel_with 3.0 spo cor · coronal · 0.68mm/px · 3 of 74 slices shown]
[im 25/74  soft-tissue]
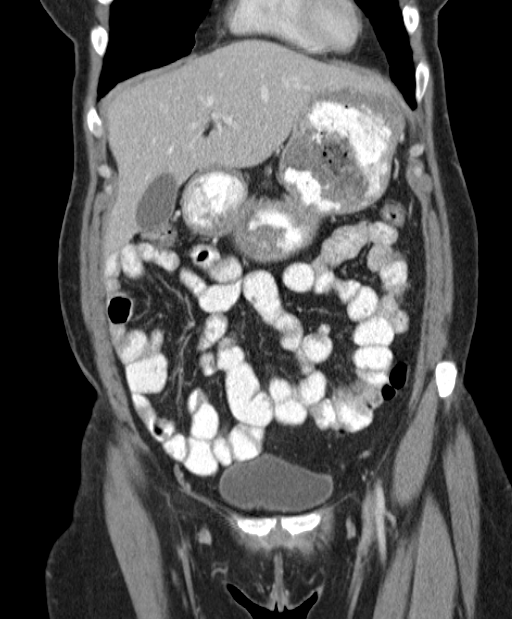
[im 33/74  soft-tissue]
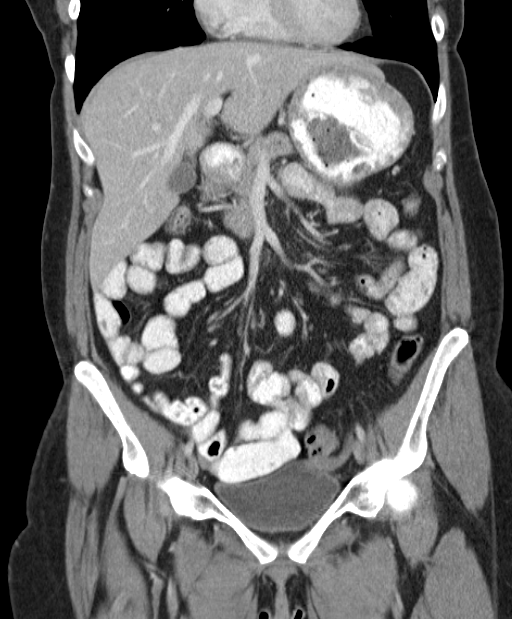
[im 41/74  soft-tissue]
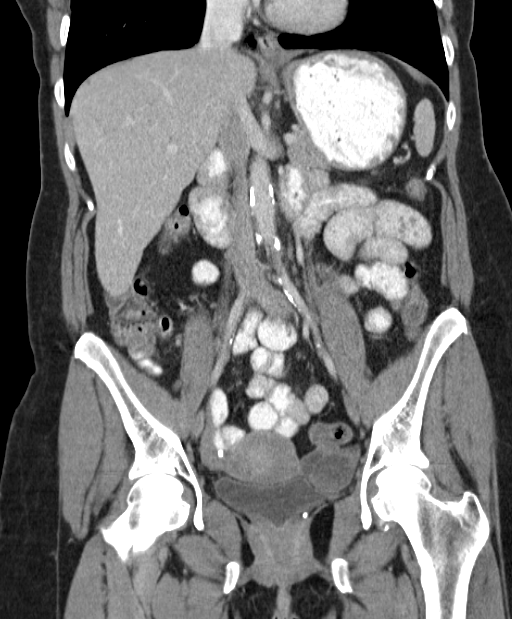

[16 of 46 positions shown; findings below may reference images not displayed]

FINDINGS: The lung bases appear clear.  There is no pericardial or
pleural effusion identified.  No focal liver abnormalities
identified.  The gallbladder appears normal.  No biliary
dilatation.  The pancreas is unremarkable.  Normal appearance of
the spleen.

Both adrenal glands appear normal.  There is an indeterminant
intermediate density cyst arising from the anterior lateral cortex
of the mid pole of the right kidney.  This has a short axis
measurement of 5.2 mm.  Similarly within the upper pole of the left
kidney there is an 8.3 mm intermediate density structure, image 20.
No evidence for nephrolithiasis or obstructive uropathy.  The
urinary bladder appears normal.  The uterus has a normal
appearance.  Septated cyst within the left ovary measures 3.7 x
cm, image 42 of series 4.

No upper abdominal adenopathy.  There is no pelvic or inguinal
adenopathy.

No free fluid or fluid collections are present within the upper
abdomen or the pelvis.

The stomach and the small bowel loops are normal.  No evidence for
bowel obstruction.  The appendix is identified and appears within
normal limits.  Normal appearance of the colon.  No obstructing
mass identified.

Review of the visualized osseous structures is unremarkable.
IMPRESSION: 1.
1.  No acute findings within the abdomen or pelvis.
2.  Septated cyst within the left ovary measures 3.7 x 2.5 cm.
3.  Indeterminant intermediate density structures within both
kidneys. These small cysts are too small to reliably characterize.
Suggest follow-up imaging in 6 to 12 months to ensure stability.

## 2015-03-21 ENCOUNTER — Emergency Department (HOSPITAL_COMMUNITY)
Admission: EM | Admit: 2015-03-21 | Discharge: 2015-03-21 | Disposition: A | Discharge status: Home / Self Care | Payer: Medicaid Other | Attending: Emergency Medicine | Admitting: Emergency Medicine

## 2015-03-21 ENCOUNTER — Encounter (HOSPITAL_COMMUNITY): Payer: Self-pay | Admitting: Emergency Medicine

## 2015-03-21 DIAGNOSIS — Z8659 Personal history of other mental and behavioral disorders: Secondary | ICD-10-CM | POA: Insufficient documentation

## 2015-03-21 DIAGNOSIS — Z7951 Long term (current) use of inhaled steroids: Secondary | ICD-10-CM | POA: Insufficient documentation

## 2015-03-21 DIAGNOSIS — Z79899 Other long term (current) drug therapy: Secondary | ICD-10-CM | POA: Diagnosis not present

## 2015-03-21 DIAGNOSIS — M65311 Trigger thumb, right thumb: Secondary | ICD-10-CM | POA: Insufficient documentation

## 2015-03-21 DIAGNOSIS — Z7982 Long term (current) use of aspirin: Secondary | ICD-10-CM | POA: Diagnosis not present

## 2015-03-21 DIAGNOSIS — I10 Essential (primary) hypertension: Secondary | ICD-10-CM | POA: Diagnosis not present

## 2015-03-21 DIAGNOSIS — M65312 Trigger thumb, left thumb: Secondary | ICD-10-CM | POA: Insufficient documentation

## 2015-03-21 DIAGNOSIS — Z720 Tobacco use: Secondary | ICD-10-CM | POA: Diagnosis not present

## 2015-03-21 DIAGNOSIS — Z8719 Personal history of other diseases of the digestive system: Secondary | ICD-10-CM | POA: Insufficient documentation

## 2015-03-21 DIAGNOSIS — M79644 Pain in right finger(s): Secondary | ICD-10-CM | POA: Diagnosis present

## 2015-03-21 DIAGNOSIS — J45909 Unspecified asthma, uncomplicated: Secondary | ICD-10-CM | POA: Diagnosis not present

## 2015-03-21 DIAGNOSIS — IMO0002 Reserved for concepts with insufficient information to code with codable children: Secondary | ICD-10-CM

## 2015-03-21 DIAGNOSIS — M1990 Unspecified osteoarthritis, unspecified site: Secondary | ICD-10-CM | POA: Insufficient documentation

## 2015-03-21 MED ORDER — NAPROXEN 500 MG PO TABS: 500.0000 mg | ORAL_TABLET | Freq: Two times a day (BID) | ORAL | Status: DC

## 2015-03-21 NOTE — Discharge Instructions (Signed)
Trigger Finger Trigger finger (digital tendinitis and stenosing tenosynovitis) is a common disorder that causes an often painful catching of the fingers or thumb. It occurs as a clicking, snapping, or locking of a finger in the palm of the hand. This is caused by a problem with the tendons that flex or bend the fingers sliding smoothly through their sheaths. The condition may occur in any finger or a couple fingers at the same time.  The finger may lock with the finger curled or suddenly straighten out with a snap. This is more common in patients with rheumatoid arthritis and diabetes. Left untreated, the condition may get worse to the point where the finger becomes locked in flexion, like making a fist, or less commonly locked with the finger straightened out. CAUSES   Inflammation and scarring that lead to swelling around the tendon sheath.  Repeated or forceful movements.  Rheumatoid arthritis, an autoimmune disease that affects joints.  Gout.  Diabetes mellitus. SIGNS AND SYMPTOMS  Soreness and swelling of your finger.  A painful clicking or snapping as you bend and straighten your finger. DIAGNOSIS  Your health care provider will do a physical exam of your finger to diagnose trigger finger. TREATMENT   Splinting for 6-8 weeks may be helpful.  Nonsteroidal anti-inflammatory medicines (NSAIDs) can help to relieve the pain and inflammation.  Cortisone injections, along with splinting, may speed up recovery. Several injections may be required. Cortisone may give relief after one injection.  Surgery is another treatment that may be used if conservative treatments do not work. Surgery can be minor, without incisions (a cut does not have to be made), and can be done with a needle through the skin.  Other surgical choices involve an open procedure in which the surgeon opens the hand through a small incision and cuts the pulley so the tendon can again slide smoothly. Your hand will still  work fine. HOME CARE INSTRUCTIONS  Apply ice to the injured area, twice per day:  Put ice in a plastic bag.  Place a towel between your skin and the bag.  Leave the ice on for 20 minutes, 3-4 times a day.  Rest your hand often. MAKE SURE YOU:   Understand these instructions.  Will watch your condition.  Will get help right away if you are not doing well or get worse. Document Released: 05/24/2004 Document Revised: 04/06/2013 Document Reviewed: 01/04/2013 [facility] Patient Information 2015 [place], [place]. This information is not intended to replace advice given to you by your health care provider. Make sure you discuss any questions you have with your health care provider.  Repetitive Strain Injuries Repetitive strain injuries (RSIs) result from overuse or misuse of soft tissues including muscles, tendons, or nerves. Tendons are the cord-like structures that attach muscles to bones. RSIs can affect almost any part of the body. However, RSIs are most common in the arms (thumbs, wrists, elbows, shoulders) and legs (ankles, knees). Common medical conditions that are often caused by repetitive strain include carpal tunnel syndrome, tennis or golfer's elbow, bursitis, and tendonitis. If RSIs are treated early, and therepeated activity is reduced or removed, the severity and length of your problems can usually be reduced. RSIs are also called cumulative trauma disorders (CTD).  CAUSES  Many RSIs occur due to repeating the same activity at work over weeks or months without sufficient rest, such as prolonged typing. RSIs also commonly occur when a hobby or sport is done repeatedly without sufficient rest. RSIs can also occur due  to repeated strain or stress on a body part in someone who has one or more risk factors for RSIs. RISK FACTORS Workplace risk factors  Frequent computer use, especially if your workstation is not adjusted for your body type.  Infrequent rest breaks.  Working in a  high-pressure environment.  Working at a [employer].  Repeating the same motion, such as frequent typing.  Working in an awkward position or holding the same position for a long time.  Forceful movements such as lifting, pulling, or pushing.  Vibration caused by using power tools.  Working in cold temperatures.  Job stress. Personal risk factors  Poor posture.  Being loose-jointed.  Not exercising regularly.  Being overweight.  Arthritis, diabetes, thyroid problems, or other long-term (chronic)medical conditions.  Vitamin deficiencies.  Keeping your fingernails long.  An unhealthy, stressful, or inactive lifestyle.  Not sleeping well. SYMPTOMS  Symptoms often begin at work but become more noticeable after the repeated stress has ended. For example, you may develop fatigue or soreness in your wrist while typingat work, and at night you may develop numbness and tingling in your fingers. Common symptoms include:   Burning, shooting, or aching pain, especially in the fingers, palms, wrists, forearms, or shoulders.  Tenderness.  Swelling.  Tingling, numbness, or loss of feeling.  Pain with certain activities, such as turning a doorknob or reaching above your head.  Weakness, heaviness, or loss of coordination in yourhand.  Muscle spasms or tightness. In some cases, symptoms can become so intense that it is difficult to perform everyday tasks. Symptoms that do not improve with rest may indicate a more serious condition.  DIAGNOSIS  Your caregiver may determine the type ofRSI you have based on your medical evaluation and a description of your activities.  TREATMENT  Treatment depends on the severity and type of RSI you have. Your caregiver may recommend rest for the affected body part, medicines, and physical or occupational therapy to reduce pain, swelling, and soreness. Discuss the activities you do repeatedly with your caregiver. Your caregiver can help you  decide whether you need to change your activities. An RSI may take months or years to heal, especially if the affected body part gets insufficient rest. In some cases, such as severe carpal tunnel syndrome, surgery may be recommended. PREVENTION  Talk with your supervisor to make sure you have the proper equipment for your work station.  Maintain good posture at your desk or work station with:  Feet flat on the floor.  Knees directly over the feet, bent at a right angle.  Lower back supported by your chair or a cushion in the curve of your lower back.  Shoulders and arms relaxed and at your sides.  Neck relaxed and not bent forwards or backwards.  Your desk and computer workstation properly adjusted to your body type.  Your chair adjusted so there is no excess pressure on the back of your thighs.  The keyboard resting above your thighs. You should be able to reach the keys with your elbows at your side, bent at a right angle. Your arms should be supported on forearm rests, with your forearms parallel to the ground.  The computer mouse within easy reach.  The monitor directly in front of you, so that your eyes are aligned with the top of the screen. The screen should be about 15 to 25 inches from your eyes.  While typing, keep your wrist straight, in a neutral position. Move your entire arm when you  move your mouse or when typing hard-to-reach keys.  Only use your computer as much as you need to for work. Do not use it during breaks.  Take breaks often from any repeated activity. Alternate with another task which requires you to use different muscles, or rest at least once every hour.  Change positions regularly. If you spend a lot of time sitting, get up, walk around, and stretch.  Do not hold pens or pencils tightly when writing.  Exercise regularly.  Maintain a normal weight.  Eat a diet with plenty of vegetables, whole grains, and fruit.  Get sufficient, restful  sleep. HOME CARE INSTRUCTIONS  If your caregiver prescribed medicine to help reduce swelling, take it as directed.  Only take over-the-counter or prescription medicines for pain, discomfort, or fever as directed by your caregiver.  Reduce, and if needed, stopthe activities that are causing your problems until you have no further symptoms.If your symptoms are work-related, you may need to talk to your supervisor about changing your activities.  When symptoms develop, put ice or a cold pack on the aching area.  Put ice in a plastic bag.  Place a towel between your skin and the bag.  Leave the ice on for 15-20 minutes.  If you were given a splint to keep your wrist from bending, wear it as instructed. It is important to wear the splint at night. Use the splint for as long as your caregiver recommends. SEEK MEDICAL CARE IF:  You develop new problems.  Your problems do not get better with medicine. MAKE SURE YOU:  Understand these instructions.  Will watch your condition.  Will get help right away if you are not doing well or get worse. Document Released: 07/25/2002 Document Revised: 02/03/2012 Document Reviewed: 09/25/2011 [facility] Patient Information 2015 [place], [place]. This information is not intended to replace advice given to you by your health care provider. Make sure you discuss any questions you have with your health care provider.

## 2015-03-21 NOTE — ED Notes (Signed)
Patient with no complaints at this time. Respirations even and unlabored. Skin warm/dry. Discharge instructions reviewed with patient at this time. Patient given opportunity to voice concerns/ask questions. Patient discharged at this time and left Emergency Department with steady gait.   

## 2015-03-21 NOTE — ED Notes (Signed)
Pt reports both hands are "locking up". Pt denies any known injury. Pt reports was seen for same at pcp office and was told "probably trigger finger"

## 2015-03-22 NOTE — ED Provider Notes (Signed)
CSN: [id number]     Arrival date & time 03/21/15  1315 History   First MD Initiated Contact with Patient 03/21/15 1328     Chief Complaint  Patient presents with  . Hand Pain     (Consider location/radiation/quality/duration/timing/severity/associated sxs/prior Treatment) HPI   Judy Boyd is a 54 y.o. female who presents to the Emergency Department complaining of pain to both thumbs for several weeks.  She describes a constant throbbing pain to her thumbs and states that, at times, her thumbs "lock up" and she has to use her other hand to move her fingers again.  She was seen by her PMD at onset and advised she had trigger fingers.  She comes to ER due to continued pain and states she needs a referral to see her orthopedist.  She denies known injury, swelling, redness, numbness or elbow pain.  She has taken ASA without relief.  She notes repetitive movements at her job.     Past Medical History  Diagnosis Date  . Hypertension   . Asthma   . GERD (gastroesophageal reflux disease)   . Bowel obstruction   . Anxiety   . Depression   . Arthritis    Past Surgical History  Procedure Laterality Date  . Abdominal surgery  2003    blockage  . Uterine ablation    . Left salpingectomy     Family History  Problem Relation Age of Onset  . Heart disease Mother   . Heart disease Father    History  Substance Use Topics  . Smoking status: Current Every Day Smoker -- 0.50 packs/day for 10 years    Types: Cigarettes  . Smokeless tobacco: Not on file  . Alcohol Use: Yes     Comment: 2 beers per week   OB History    No data available     Review of Systems  Constitutional: Negative for fever and chills.  Gastrointestinal: Negative for nausea and vomiting.  Musculoskeletal: Positive for arthralgias (bilateral thumb pain). Negative for joint swelling.  Skin: Negative for color change and wound.  Neurological: Negative for weakness and numbness.  All other systems reviewed and are  negative.     Allergies  Review of patient's allergies indicates no known allergies.  Home Medications   Prior to Admission medications   Medication Sig Start Date End Date Taking? Authorizing Provider  albuterol (PROVENTIL HFA;VENTOLIN HFA) 108 (90 BASE) MCG/ACT inhaler Inhale 2 puffs into the lungs every 6 (six) hours as needed. Shortness of breath   Yes Historical Provider, MD  amLODipine (NORVASC) 10 MG tablet Take 10 mg by mouth daily.   Yes Historical Provider, MD  aspirin EC 81 MG tablet Take 81 mg by mouth daily.   Yes Historical Provider, MD  budesonide-formoterol (SYMBICORT) 160-4.5 MCG/ACT inhaler Inhale 2 puffs into the lungs 2 (two) times daily.    Yes Historical Provider, MD  cyclobenzaprine (FLEXERIL) 10 MG tablet Take 10 mg by mouth 3 (three) times daily as needed. Muscle Spasms   Yes Historical Provider, MD  HYDROcodone-acetaminophen (NORCO) 7.5-325 MG per tablet Take 1 tablet by mouth every 6 (six) hours as needed. pain   Yes Historical Provider, MD  lisinopril (PRINIVIL,ZESTRIL) 10 MG tablet Take 0.5 tablets (5 mg total) by mouth daily. 02/10/14  Yes Fay Records, MD  loratadine (CLARITIN) 10 MG tablet Take 10 mg by mouth daily as needed for allergies.    Yes Historical Provider, MD  montelukast (SINGULAIR) 10 MG tablet Take 10  mg by mouth at bedtime.     Yes Historical Provider, MD  nabumetone (RELAFEN) 500 MG tablet Take 500 mg by mouth daily as needed for mild pain.   Yes Historical Provider, MD  nicotine (NICODERM CQ - DOSED IN MG/24 HOURS) 14 mg/24hr patch Place 1 patch (14 mg total) onto the skin daily. Dispense # 30 02/10/14  Yes Fay Records, MD  naproxen (NAPROSYN) 500 MG tablet Take 1 tablet (500 mg total) by mouth 2 (two) times daily with a meal. 03/21/15   Bren Steers, PA-C   BP 176/99 mmHg  Pulse 69  Temp(Src) 98.1 F (36.7 C) (Oral)  Resp 16  Ht 5\' 6"  (1.676 m)  Wt 130 lb (58.968 kg)  BMI 20.99 kg/m2  SpO2 100% Physical Exam  Constitutional: She is  oriented to person, place, and time. She appears well-developed and well-nourished. No distress.  HENT:  Head: Normocephalic and atraumatic.  Cardiovascular: Normal rate, regular rhythm and normal heart sounds.   Pulmonary/Chest: Effort normal and breath sounds normal. No respiratory distress.  Musculoskeletal: She exhibits tenderness. She exhibits no edema.  Tenderness of the proximal joint of the bilateral thumbs.  Has a click to the joint with movement on the left.  Radial pulse is brisk, distal sensation intact.  CR< 2 sec.  No bruising or bony deformity.  Patient has full ROM. No proximal tenderness.  Neurological: She is alert and oriented to person, place, and time. She exhibits normal muscle tone. Coordination normal.  Skin: Skin is warm and dry.  Nursing note and vitals reviewed.   ED Course  Procedures (including critical care time) Labs Review Labs Reviewed - No data to display  Imaging Review No results found.   EKG Interpretation None      MDM   Final diagnoses:  Trigger finger of both hands   Pt with likely trigger finger, left > right.  No concerning sx's for infectious process.  NV intact.  Thumb spica applied for comfort.  Referral given for ortho.    Kem Parkinson, PA-C 03/22/15 2042  Fredia Sorrow, MD 03/24/15 [phone]

## 2015-04-01 ENCOUNTER — Encounter (HOSPITAL_COMMUNITY): Payer: Self-pay | Admitting: *Deleted

## 2015-04-01 ENCOUNTER — Emergency Department (HOSPITAL_COMMUNITY)
Admission: EM | Admit: 2015-04-01 | Discharge: 2015-04-01 | Disposition: A | Discharge status: Home / Self Care | Payer: Medicaid Other | Attending: Emergency Medicine | Admitting: Emergency Medicine

## 2015-04-01 DIAGNOSIS — I10 Essential (primary) hypertension: Secondary | ICD-10-CM | POA: Insufficient documentation

## 2015-04-01 DIAGNOSIS — Z79899 Other long term (current) drug therapy: Secondary | ICD-10-CM | POA: Insufficient documentation

## 2015-04-01 DIAGNOSIS — M1990 Unspecified osteoarthritis, unspecified site: Secondary | ICD-10-CM | POA: Insufficient documentation

## 2015-04-01 DIAGNOSIS — Z7982 Long term (current) use of aspirin: Secondary | ICD-10-CM | POA: Insufficient documentation

## 2015-04-01 DIAGNOSIS — M65312 Trigger thumb, left thumb: Secondary | ICD-10-CM | POA: Diagnosis not present

## 2015-04-01 DIAGNOSIS — M65311 Trigger thumb, right thumb: Secondary | ICD-10-CM | POA: Insufficient documentation

## 2015-04-01 DIAGNOSIS — Z720 Tobacco use: Secondary | ICD-10-CM | POA: Insufficient documentation

## 2015-04-01 DIAGNOSIS — IMO0002 Reserved for concepts with insufficient information to code with codable children: Secondary | ICD-10-CM

## 2015-04-01 DIAGNOSIS — Z8719 Personal history of other diseases of the digestive system: Secondary | ICD-10-CM | POA: Diagnosis not present

## 2015-04-01 DIAGNOSIS — Z791 Long term (current) use of non-steroidal anti-inflammatories (NSAID): Secondary | ICD-10-CM | POA: Diagnosis not present

## 2015-04-01 DIAGNOSIS — J45909 Unspecified asthma, uncomplicated: Secondary | ICD-10-CM | POA: Insufficient documentation

## 2015-04-01 DIAGNOSIS — Z8659 Personal history of other mental and behavioral disorders: Secondary | ICD-10-CM | POA: Insufficient documentation

## 2015-04-01 DIAGNOSIS — M79644 Pain in right finger(s): Secondary | ICD-10-CM | POA: Diagnosis present

## 2015-04-01 DIAGNOSIS — M79645 Pain in left finger(s): Secondary | ICD-10-CM | POA: Diagnosis not present

## 2015-04-01 MED ORDER — CLONIDINE HCL 0.1 MG PO TABS: 0.1000 mg | ORAL_TABLET | Freq: Once | ORAL | Status: DC

## 2015-04-01 NOTE — Discharge Instructions (Signed)
If you were given medicines take as directed.  If you are on coumadin or contraceptives realize their levels and effectiveness is altered by many different medicines.  If you have any reaction (rash, tongues swelling, other) to the medicines stop taking and see a physician.    If your blood pressure was elevated in the ER make sure you follow up for management with a primary doctor or return for chest pain, shortness of breath or stroke symptoms.  Please follow up as directed and return to the ER or see a physician for new or worsening symptoms.  Thank you. Filed Vitals:   04/01/15 1511  BP: 204/107  Pulse: 82  Temp: 98.7 F (37.1 C)  TempSrc: Oral  Resp: 18  Height: 5\' 6"  (1.676 m)  Weight: 130 lb (58.968 kg)  SpO2: 100%

## 2015-04-01 NOTE — ED Notes (Signed)
Patient seen here on 03/21/15, dx with trigger finger of bil. Hands, patient has not had follow up yet, and reports pain is worse.

## 2015-04-01 NOTE — ED Provider Notes (Signed)
CSN: [id number]     Arrival date & time 04/01/15  1504 History   First MD Initiated Contact with Patient 04/01/15 1521     Chief Complaint  Patient presents with  . trigger finger      (Consider location/radiation/quality/duration/timing/severity/associated sxs/prior Treatment) HPI Comments: 54 year old female with reflux history presents with bilateral thumb pain similar to previous for the past 2-3 weeks. Patient was seen diagnoses likely trigger finger. Patient was told she might have rheumatoid arthritis, no specialist she follows with. Patient has done multiple different jobs with repetition, currently does not have a job as that was worsening her symptoms. Patient has seen orthopedics in the past however needs a new referral. Patient feels intermittently her thumb will lock and she will require heat or manually to straighten.  The history is provided by the patient.    Past Medical History  Diagnosis Date  . Hypertension   . Asthma   . GERD (gastroesophageal reflux disease)   . Bowel obstruction   . Anxiety   . Depression   . Arthritis    Past Surgical History  Procedure Laterality Date  . Abdominal surgery  2003    blockage  . Uterine ablation    . Left salpingectomy     Family History  Problem Relation Age of Onset  . Heart disease Mother   . Heart disease Father    Social History  Substance Use Topics  . Smoking status: Current Every Day Smoker -- 0.50 packs/day for 10 years    Types: Cigarettes  . Smokeless tobacco: None  . Alcohol Use: Yes     Comment: 2 beers per week   OB History    No data available     Review of Systems  Constitutional: Negative for fever.  Respiratory: Negative for shortness of breath.   Cardiovascular: Negative for chest pain.  Genitourinary: Negative for dysuria and flank pain.  Musculoskeletal: Negative for back pain, neck pain and neck stiffness.  Skin: Negative for rash.  Neurological: Negative for light-headedness and  headaches.      Allergies  Review of patient's allergies indicates no known allergies.  Home Medications   Prior to Admission medications   Medication Sig Start Date End Date Taking? Authorizing Provider  amLODipine (NORVASC) 10 MG tablet Take 10 mg by mouth daily.   Yes Historical Provider, MD  aspirin EC 81 MG tablet Take 81 mg by mouth daily.   Yes Historical Provider, MD  budesonide-formoterol (SYMBICORT) 160-4.5 MCG/ACT inhaler Inhale 2 puffs into the lungs 2 (two) times daily.    Yes Historical Provider, MD  cyclobenzaprine (FLEXERIL) 10 MG tablet Take 10 mg by mouth 3 (three) times daily as needed. Muscle Spasms   Yes Historical Provider, MD  lisinopril (PRINIVIL,ZESTRIL) 10 MG tablet Take 0.5 tablets (5 mg total) by mouth daily. 02/10/14  Yes Fay Records, MD  montelukast (SINGULAIR) 10 MG tablet Take 10 mg by mouth at bedtime.     Yes Historical Provider, MD  naproxen (NAPROSYN) 500 MG tablet Take 1 tablet (500 mg total) by mouth 2 (two) times daily with a meal. 03/21/15  Yes Tammy Triplett, PA-C  albuterol (PROVENTIL HFA;VENTOLIN HFA) 108 (90 BASE) MCG/ACT inhaler Inhale 2 puffs into the lungs every 6 (six) hours as needed. Shortness of breath    Historical Provider, MD  HYDROcodone-acetaminophen (NORCO) 7.5-325 MG per tablet Take 1 tablet by mouth every 6 (six) hours as needed. pain    Historical Provider, MD  loratadine (CLARITIN) 10  MG tablet Take 10 mg by mouth daily as needed for allergies.     Historical Provider, MD  nabumetone (RELAFEN) 500 MG tablet Take 500 mg by mouth daily as needed for mild pain.    Historical Provider, MD  nicotine (NICODERM CQ - DOSED IN MG/24 HOURS) 14 mg/24hr patch Place 1 patch (14 mg total) onto the skin daily. Dispense # 30 02/10/14   Fay Records, MD   BP 204/107 mmHg  Pulse 82  Temp(Src) 98.7 F (37.1 C) (Oral)  Resp 18  Ht 5\' 6"  (1.676 m)  Wt 130 lb (58.968 kg)  BMI 20.99 kg/m2  SpO2 100% Physical Exam  Constitutional: She is oriented  to person, place, and time. She appears well-developed and well-nourished. No distress.  HENT:  Head: Normocephalic and atraumatic.  Eyes: Right eye exhibits no discharge. Left eye exhibits no discharge.  Neck: Normal range of motion. Neck supple. No tracheal deviation present.  Cardiovascular: Normal rate and regular rhythm.   Pulmonary/Chest: Effort normal and breath sounds normal.  Abdominal: Soft. She exhibits no distension. There is no tenderness. There is no guarding.  Musculoskeletal: She exhibits tenderness. She exhibits no edema.  Patient has mild tenderness PIP joint thumbs bilateral, full range of motion with mild discomfort, no signs of infection  Neurological: She is alert and oriented to person, place, and time.  Skin: Skin is warm. No rash noted.  Psychiatric: She has a normal mood and affect.  Nursing note and vitals reviewed.   ED Course  Procedures (including critical care time) Labs Review Labs Reviewed - No data to display  Imaging Review No results found. I, Mariea Clonts, personally reviewed and evaluated these images and lab results as part of my medical decision-making.   EKG Interpretation None      MDM   Final diagnoses:  Bilateral thumb pain  Trigger finger of both hands  Essential hypertension   Concern for trigger finger versus arthritis. Referral to orthopedics for further evaluation. Patient currently does not have a repetitive job. Patient's blood pressure significantly high however no chest pain no shortness of breath well-appearing. Patient has not taken her medicines today. Patient understands to take her medicines and follow-up outpatient if remains high over the next couple days. Results and differential diagnosis were discussed with the patient/parent/guardian. Xrays were independently reviewed by myself.  Close follow up outpatient was discussed, comfortable with the plan.   Medications - No data to display  Filed Vitals:    04/01/15 1511  BP: 204/107  Pulse: 82  Temp: 98.7 F (37.1 C)  TempSrc: Oral  Resp: 18  Height: 5\' 6"  (1.676 m)  Weight: 130 lb (58.968 kg)  SpO2: 100%    Final diagnoses:  Bilateral thumb pain  Trigger finger of both hands  Essential hypertension       Elnora Morrison, MD 04/01/15 1545

## 2015-04-04 ENCOUNTER — Telehealth: Payer: Self-pay | Admitting: Orthopedic Surgery

## 2015-04-04 NOTE — Telephone Encounter (Signed)
Patient called to inquire about appointment following Emergency Room visit(x2) for problem of hand pain; states was advised to see Dr Aline Brochure; states has seen Dr Luna Glasgow for other orthopedic issues ("for back and arthritis") but not for this problem.  Discussed appointment and relayed that patient's insurance requires referral from primary care physician; appointment pending referral. Patient states she will request referral from her doctor.

## 2015-05-02 ENCOUNTER — Emergency Department (HOSPITAL_COMMUNITY)
Admission: EM | Admit: 2015-05-02 | Discharge: 2015-05-02 | Disposition: A | Discharge status: Home / Self Care | Payer: Medicaid Other | Attending: Emergency Medicine | Admitting: Emergency Medicine

## 2015-05-02 ENCOUNTER — Encounter (HOSPITAL_COMMUNITY): Payer: Self-pay | Admitting: *Deleted

## 2015-05-02 DIAGNOSIS — Z79899 Other long term (current) drug therapy: Secondary | ICD-10-CM | POA: Insufficient documentation

## 2015-05-02 DIAGNOSIS — R21 Rash and other nonspecific skin eruption: Secondary | ICD-10-CM

## 2015-05-02 DIAGNOSIS — Z7951 Long term (current) use of inhaled steroids: Secondary | ICD-10-CM | POA: Diagnosis not present

## 2015-05-02 DIAGNOSIS — Z8659 Personal history of other mental and behavioral disorders: Secondary | ICD-10-CM | POA: Diagnosis not present

## 2015-05-02 DIAGNOSIS — J45909 Unspecified asthma, uncomplicated: Secondary | ICD-10-CM | POA: Insufficient documentation

## 2015-05-02 DIAGNOSIS — I10 Essential (primary) hypertension: Secondary | ICD-10-CM | POA: Diagnosis not present

## 2015-05-02 DIAGNOSIS — Z720 Tobacco use: Secondary | ICD-10-CM | POA: Diagnosis not present

## 2015-05-02 DIAGNOSIS — Z8719 Personal history of other diseases of the digestive system: Secondary | ICD-10-CM | POA: Diagnosis not present

## 2015-05-02 DIAGNOSIS — Z7982 Long term (current) use of aspirin: Secondary | ICD-10-CM | POA: Diagnosis not present

## 2015-05-02 DIAGNOSIS — M1990 Unspecified osteoarthritis, unspecified site: Secondary | ICD-10-CM | POA: Insufficient documentation

## 2015-05-02 MED ORDER — PREDNISONE 50 MG PO TABS: ORAL_TABLET | ORAL | Status: DC

## 2015-05-02 NOTE — ED Provider Notes (Signed)
CSN: [id number]     Arrival date & time 05/02/15  1328 History   First MD Initiated Contact with Patient 05/02/15 1348     Chief Complaint  Patient presents with  . Rash      Patient is a 54 y.o. female presenting with rash. The history is provided by the patient.  Rash Location:  Shoulder/arm and head/neck Head/neck rash location:  R neck Shoulder/arm rash location:  R arm and R shoulder Severity:  Mild Onset quality:  Gradual Duration:  2 weeks Timing:  Constant Progression:  Worsening Chronicity:  New Relieved by:  None tried Worsened by:  Nothing tried Associated symptoms: no shortness of breath, no throat swelling and no tongue swelling     Past Medical History  Diagnosis Date  . Hypertension   . Asthma   . GERD (gastroesophageal reflux disease)   . Bowel obstruction   . Anxiety   . Depression   . Arthritis    Past Surgical History  Procedure Laterality Date  . Abdominal surgery  2003    blockage  . Uterine ablation    . Left salpingectomy     Family History  Problem Relation Age of Onset  . Heart disease Mother   . Heart disease Father    Social History  Substance Use Topics  . Smoking status: Current Every Day Smoker -- 0.50 packs/day for 10 years    Types: Cigarettes  . Smokeless tobacco: None  . Alcohol Use: Yes     Comment: 2 beers per week   OB History    No data available     Review of Systems  Respiratory: Negative for shortness of breath.   Skin: Positive for rash.      Allergies  Review of patient's allergies indicates no known allergies.  Home Medications   Prior to Admission medications   Medication Sig Start Date End Date Taking? Authorizing Provider  albuterol (PROVENTIL HFA;VENTOLIN HFA) 108 (90 BASE) MCG/ACT inhaler Inhale 2 puffs into the lungs every 6 (six) hours as needed. Shortness of breath   Yes Historical Provider, MD  amLODipine (NORVASC) 10 MG tablet Take 10 mg by mouth daily.   Yes Historical Provider, MD   aspirin EC 81 MG tablet Take 81 mg by mouth daily.   Yes Historical Provider, MD  budesonide-formoterol (SYMBICORT) 160-4.5 MCG/ACT inhaler Inhale 2 puffs into the lungs 2 (two) times daily.    Yes Historical Provider, MD  cyclobenzaprine (FLEXERIL) 10 MG tablet Take 10 mg by mouth 3 (three) times daily as needed. Muscle Spasms   Yes Historical Provider, MD  HYDROcodone-acetaminophen (NORCO) 7.5-325 MG per tablet Take 1 tablet by mouth every 6 (six) hours as needed. pain   Yes Historical Provider, MD  losartan (COZAAR) 50 MG tablet TK 1 T PO DAILY 04/18/15  Yes Historical Provider, MD  montelukast (SINGULAIR) 10 MG tablet Take 10 mg by mouth at bedtime.     Yes Historical Provider, MD  nabumetone (RELAFEN) 500 MG tablet Take 500 mg by mouth daily as needed for mild pain.   Yes Historical Provider, MD  predniSONE (DELTASONE) 50 MG tablet One tablet PO daily for 5 days 05/02/15   Ripley Fraise, MD   BP 167/107 mmHg  Pulse 87  Temp(Src) 97.9 F (36.6 C) (Oral)  Resp 18  Ht 5\' 5"  (1.651 m)  Wt 138 lb (62.596 kg)  BMI 22.96 kg/m2  SpO2 100% Physical Exam CONSTITUTIONAL: Well developed/well nourished HEAD: Normocephalic/atraumatic EYES: EOMI/PERRL ENMT: Mucous membranes  moist, no angioedema NECK: supple no meningeal signs LUNGS:no apparent distress ABDOMEN: soft NEURO: Pt is awake/alert/appropriate.   EXTREMITIES: pulses normal/equal, full ROM SKIN: warm, color normal, scattered rash to right arm and also neck.  No other rash noted.   PSYCH: no abnormalities of mood noted, alert and oriented to situation  ED Course  Procedures   MDM   Final diagnoses:  Rash    Nursing notes including past medical history and social history reviewed and considered in documentation     Ripley Fraise, MD 05/02/15 1405

## 2015-05-02 NOTE — ED Notes (Signed)
Pt with rash to arms, back and left jaw for 2 weeks that itch, denies fever or N/V/D

## 2015-08-01 ENCOUNTER — Encounter (HOSPITAL_COMMUNITY): Payer: Self-pay | Admitting: Emergency Medicine

## 2015-08-01 ENCOUNTER — Emergency Department (HOSPITAL_COMMUNITY)
Admission: EM | Admit: 2015-08-01 | Discharge: 2015-08-01 | Disposition: A | Discharge status: Home / Self Care | Payer: Medicaid Other | Attending: Emergency Medicine | Admitting: Emergency Medicine

## 2015-08-01 ENCOUNTER — Emergency Department (HOSPITAL_COMMUNITY): Payer: Medicaid Other

## 2015-08-01 DIAGNOSIS — F17210 Nicotine dependence, cigarettes, uncomplicated: Secondary | ICD-10-CM | POA: Diagnosis not present

## 2015-08-01 DIAGNOSIS — I10 Essential (primary) hypertension: Secondary | ICD-10-CM | POA: Diagnosis not present

## 2015-08-01 DIAGNOSIS — Z8719 Personal history of other diseases of the digestive system: Secondary | ICD-10-CM | POA: Diagnosis not present

## 2015-08-01 DIAGNOSIS — Z79899 Other long term (current) drug therapy: Secondary | ICD-10-CM | POA: Diagnosis not present

## 2015-08-01 DIAGNOSIS — Z8659 Personal history of other mental and behavioral disorders: Secondary | ICD-10-CM | POA: Insufficient documentation

## 2015-08-01 DIAGNOSIS — Z7982 Long term (current) use of aspirin: Secondary | ICD-10-CM | POA: Insufficient documentation

## 2015-08-01 DIAGNOSIS — J4540 Moderate persistent asthma, uncomplicated: Secondary | ICD-10-CM | POA: Diagnosis not present

## 2015-08-01 DIAGNOSIS — J441 Chronic obstructive pulmonary disease with (acute) exacerbation: Secondary | ICD-10-CM | POA: Insufficient documentation

## 2015-08-01 DIAGNOSIS — M25512 Pain in left shoulder: Secondary | ICD-10-CM | POA: Diagnosis not present

## 2015-08-01 DIAGNOSIS — R0602 Shortness of breath: Secondary | ICD-10-CM | POA: Diagnosis present

## 2015-08-01 DIAGNOSIS — R140 Abdominal distension (gaseous): Secondary | ICD-10-CM | POA: Insufficient documentation

## 2015-08-01 HISTORY — DX: Chronic obstructive pulmonary disease, unspecified: J44.9

## 2015-08-01 MED ORDER — PREDNISONE 10 MG PO TABS: ORAL_TABLET | ORAL | Status: DC

## 2015-08-01 MED ORDER — IPRATROPIUM-ALBUTEROL 0.5-2.5 (3) MG/3ML IN SOLN
3.0000 mL | Freq: Once | RESPIRATORY_TRACT | Status: AC
  Administered 2015-08-01: 3 mL via RESPIRATORY_TRACT
  Filled 2015-08-01: qty 3

## 2015-08-01 MED ORDER — PREDNISONE 50 MG PO TABS
60.0000 mg | ORAL_TABLET | Freq: Every day | ORAL | Status: DC
  Administered 2015-08-01: 60 mg via ORAL
  Filled 2015-08-01: qty 1

## 2015-08-01 NOTE — Discharge Instructions (Signed)
Asthma Attack Prevention °While you may not be able to control the fact that you have asthma, you can take actions to prevent asthma attacks. The best way to prevent asthma attacks is to maintain good control of your asthma. You can achieve this by: °· Taking your medicines as directed. °· Avoiding things that can irritate your airways or make your asthma symptoms worse (asthma triggers). °· Keeping track of how well your asthma is controlled and of any changes in your symptoms. °· Responding quickly to worsening asthma symptoms (asthma attack). °· Seeking emergency care when it is needed. °WHAT ARE SOME WAYS TO PREVENT AN ASTHMA ATTACK? °Have a Plan °Work with your health care provider to create a written plan for managing and treating your asthma attacks (asthma action plan). This plan includes: °· A list of your asthma triggers and how you can avoid them. °· Information on when medicines should be taken and when their dosages should be changed. °· The use of a device that measures how well your lungs are working (peak flow meter). °Monitor Your Asthma °Use your peak flow meter and record your results in a journal every day. A drop in your peak flow numbers on one or more days may indicate the start of an asthma attack. This can happen even before you start to feel symptoms. You can prevent an asthma attack from getting worse by following the steps in your asthma action plan. °Avoid Asthma Triggers °Work with your asthma health care provider to find out what your asthma triggers are. This can be done by: °· Allergy testing. °· Keeping a journal that notes when asthma attacks occur and the factors that may have contributed to them. °· Determining if there are other medical conditions that are making your asthma worse. °Once you have determined your asthma triggers, take steps to avoid them. This may include avoiding excessive or prolonged exposure to: °· Dust. Have someone dust and vacuum your home for you once or  twice a week. Using a high-efficiency particulate arrestance (HEPA) vacuum is best. °· Smoke. This includes campfire smoke, forest fire smoke, and secondhand smoke from tobacco products. °· Pet dander. Avoid contact with animals that you know you are allergic to. °· Allergens from trees, grasses or pollens. Avoid spending a lot of time outdoors when pollen counts are high, and on very windy days. °· Very cold, dry, or humid air. °· Mold. °· Foods that contain high amounts of sulfites. °· Strong odors. °· Outdoor air pollutants, such as engine exhaust. °· Indoor air pollutants, such as aerosol sprays and fumes from household cleaners. °· Household pests, including dust mites and cockroaches, and pest droppings. °· Certain medicines, including NSAIDs. Always talk to your health care provider before stopping or starting any new medicines. °Medicines °Take over-the-counter and prescription medicines only as told by your health care provider. Many asthma attacks can be prevented by carefully following your medicine schedule. Taking your medicines correctly is especially important when you cannot avoid certain asthma triggers. °Act Quickly °If an asthma attack does happen, acting quickly can decrease how severe it is and how long it lasts. Take these steps:  °· Pay attention to your symptoms. If you are coughing, wheezing, or having difficulty breathing, do not wait to see if your symptoms go away on their own. Follow your asthma action plan. °· If you have followed your asthma action plan and your symptoms are not improving, call your health care provider or seek immediate medical care   at the nearest hospital. It is important to note how often you need to use your fast-acting rescue inhaler. If you are using your rescue inhaler more often, it may mean that your asthma is not under control. Adjusting your asthma treatment plan may help you to prevent future asthma attacks and help you to gain better control of your  condition. HOW CAN I PREVENT AN ASTHMA ATTACK WHEN I EXERCISE? Follow advice from your health care provider about whether you should use your fast-acting inhaler before exercising. Many people with asthma experience exercise-induced bronchoconstriction (EIB). This condition often worsens during vigorous exercise in cold, humid, or dry environments. Usually, people with EIB can stay very active by pre-treating with a fast-acting inhaler before exercising.   This information is not intended to replace advice given to you by your health care provider. Make sure you discuss any questions you have with your health care provider.   Document Released: 07/23/2009 Document Revised: 04/25/2015 Document Reviewed: 01/04/2015 Elsevier Interactive Patient Education 2016 [employer].  Asthma, Adult Asthma is a recurring condition in which the airways tighten and narrow. Asthma can make it difficult to breathe. It can cause coughing, wheezing, and shortness of breath. Asthma episodes, also called asthma attacks, range from minor to life-threatening. Asthma cannot be cured, but medicines and lifestyle changes can help control it. CAUSES Asthma is believed to be caused by inherited (genetic) and environmental factors, but its exact cause is unknown. Asthma may be triggered by allergens, lung infections, or irritants in the air. Asthma triggers are different for each person. Common triggers include:   Animal dander.  Dust mites.  Cockroaches.  Pollen from trees or grass.  Mold.  Smoke.  Air pollutants such as dust, household cleaners, hair sprays, aerosol sprays, paint fumes, strong chemicals, or strong odors.  Cold air, weather changes, and winds (which increase molds and pollens in the air).  Strong emotional expressions such as crying or laughing hard.  Stress.  Certain medicines (such as aspirin) or types of drugs (such as beta-blockers).  Sulfites in foods and drinks. Foods and drinks that  may contain sulfites include dried fruit, potato chips, and sparkling grape juice.  Infections or inflammatory conditions such as the flu, a cold, or an inflammation of the nasal membranes (rhinitis).  Gastroesophageal reflux disease (GERD).  Exercise or strenuous activity. SYMPTOMS Symptoms may occur immediately after asthma is triggered or many hours later. Symptoms include:  Wheezing.  Excessive nighttime or early morning coughing.  Frequent or severe coughing with a common cold.  Chest tightness.  Shortness of breath. DIAGNOSIS  The diagnosis of asthma is made by a review of your medical history and a physical exam. Tests may also be performed. These may include:  Lung function studies. These tests show how much air you breathe in and out.  Allergy tests.  Imaging tests such as X-rays. TREATMENT  Asthma cannot be cured, but it can usually be controlled. Treatment involves identifying and avoiding your asthma triggers. It also involves medicines. There are 2 classes of medicine used for asthma treatment:   Controller medicines. These prevent asthma symptoms from occurring. They are usually taken every day.  Reliever or rescue medicines. These quickly relieve asthma symptoms. They are used as needed and provide short-term relief. Your health care provider will help you create an asthma action plan. An asthma action plan is a written plan for managing and treating your asthma attacks. It includes a list of your asthma triggers and how they  may be avoided. It also includes information on when medicines should be taken and when their dosage should be changed. An action plan may also involve the use of a device called a peak flow meter. A peak flow meter measures how well the lungs are working. It helps you monitor your condition. HOME CARE INSTRUCTIONS   Take medicines only as directed by your health care provider. Speak with your health care provider if you have questions about  how or when to take the medicines.  Use a peak flow meter as directed by your health care provider. Record and keep track of readings.  Understand and use the action plan to help minimize or stop an asthma attack without needing to seek medical care.  Control your home environment in the following ways to help prevent asthma attacks:  Do not smoke. Avoid being exposed to secondhand smoke.  Change your heating and air conditioning filter regularly.  Limit your use of fireplaces and wood stoves.  Get rid of pests (such as roaches and mice) and their droppings.  Throw away plants if you see mold on them.  Clean your floors and dust regularly. Use unscented cleaning products.  Try to have someone else vacuum for you regularly. Stay out of rooms while they are being vacuumed and for a short while afterward. If you vacuum, use a dust mask from a hardware store, a double-layered or microfilter vacuum cleaner bag, or a vacuum cleaner with a HEPA filter.  Replace carpet with wood, tile, or vinyl flooring. Carpet can trap dander and dust.  Use allergy-proof pillows, mattress covers, and box spring covers.  Wash bed sheets and blankets every week in hot water and dry them in a dryer.  Use blankets that are made of polyester or cotton.  Clean bathrooms and kitchens with bleach. If possible, have someone repaint the walls in these rooms with mold-resistant paint. Keep out of the rooms that are being cleaned and painted.  Wash hands frequently. SEEK MEDICAL CARE IF:   You have wheezing, shortness of breath, or a cough even if taking medicine to prevent attacks.  The colored mucus you cough up (sputum) is thicker than usual.  Your sputum changes from clear or white to yellow, green, gray, or bloody.  You have any problems that may be related to the medicines you are taking (such as a rash, itching, swelling, or trouble breathing).  You are using a reliever medicine more than 2-3 times per  week.  Your peak flow is still at 50-79% of your personal best after following your action plan for 1 hour.  You have a fever. SEEK IMMEDIATE MEDICAL CARE IF:   You seem to be getting worse and are unresponsive to treatment during an asthma attack.  You are short of breath even at rest.  You get short of breath when doing very little physical activity.  You have difficulty eating, drinking, or talking due to asthma symptoms.  You develop chest pain.  You develop a fast heartbeat.  You have a bluish color to your lips or fingernails.  You are light-headed, dizzy, or faint.  Your peak flow is less than 50% of your personal best.   This information is not intended to replace advice given to you by your health care provider. Make sure you discuss any questions you have with your health care provider.   Document Released: 08/04/2005 Document Revised: 04/25/2015 Document Reviewed: 03/03/2013 Elsevier Interactive Patient Education 2016 [employer]  Prevention While you may not be able to control the fact that you have asthma, you can take actions to prevent asthma attacks. The best way to prevent asthma attacks is to maintain good control of your asthma. You can achieve this by:  Taking your medicines as directed.  Avoiding things that can irritate your airways or make your asthma symptoms worse (asthma triggers).  Keeping track of how well your asthma is controlled and of any changes in your symptoms.  Responding quickly to worsening asthma symptoms (asthma attack).  Seeking emergency care when it is needed. WHAT ARE SOME WAYS TO PREVENT AN ASTHMA ATTACK? Have a Plan Work with your health care provider to create a written plan for managing and treating your asthma attacks (asthma action plan). This plan includes:  A list of your asthma triggers and how you can avoid them.  Information on when medicines should be taken and when their dosages should be  changed.  The use of a device that measures how well your lungs are working (peak flow meter). Monitor Your Asthma Use your peak flow meter and record your results in a journal every day. A drop in your peak flow numbers on one or more days may indicate the start of an asthma attack. This can happen even before you start to feel symptoms. You can prevent an asthma attack from getting worse by following the steps in your asthma action plan. Avoid Asthma Triggers Work with your asthma health care provider to find out what your asthma triggers are. This can be done by:  Allergy testing.  Keeping a journal that notes when asthma attacks occur and the factors that may have contributed to them.  Determining if there are other medical conditions that are making your asthma worse. Once you have determined your asthma triggers, take steps to avoid them. This may include avoiding excessive or prolonged exposure to:  Dust. Have someone dust and vacuum your home for you once or twice a week. Using a high-efficiency particulate arrestance (HEPA) vacuum is best.  Smoke. This includes campfire smoke, forest fire smoke, and secondhand smoke from tobacco products.  Pet dander. Avoid contact with animals that you know you are allergic to.  Allergens from trees, grasses or pollens. Avoid spending a lot of time outdoors when pollen counts are high, and on very windy days.  Very cold, dry, or humid air.  Mold.  Foods that contain high amounts of sulfites.  Strong odors.  Outdoor air pollutants, such as [employer].  Indoor air pollutants, such as aerosol sprays and fumes from household cleaners.  Household pests, including dust mites and cockroaches, and pest droppings.  Certain medicines, including NSAIDs. Always talk to your health care provider before stopping or starting any new medicines. Medicines Take over-the-counter and prescription medicines only as told by your health care provider.  Many asthma attacks can be prevented by carefully following your medicine schedule. Taking your medicines correctly is especially important when you cannot avoid certain asthma triggers. Act Quickly If an asthma attack does happen, acting quickly can decrease how severe it is and how long it lasts. Take these steps:   Pay attention to your symptoms. If you are coughing, wheezing, or having difficulty breathing, do not wait to see if your symptoms go away on their own. Follow your asthma action plan.  If you have followed your asthma action plan and your symptoms are not improving, call your health care provider or seek immediate medical care at  the nearest hospital. It is important to note how often you need to use your fast-acting rescue inhaler. If you are using your rescue inhaler more often, it may mean that your asthma is not under control. Adjusting your asthma treatment plan may help you to prevent future asthma attacks and help you to gain better control of your condition. HOW CAN I PREVENT AN ASTHMA ATTACK WHEN I EXERCISE? Follow advice from your health care provider about whether you should use your fast-acting inhaler before exercising. Many people with asthma experience exercise-induced bronchoconstriction (EIB). This condition often worsens during vigorous exercise in cold, humid, or dry environments. Usually, people with EIB can stay very active by pre-treating with a fast-acting inhaler before exercising.   This information is not intended to replace advice given to you by your health care provider. Make sure you discuss any questions you have with your health care provider.   Document Released: 07/23/2009 Document Revised: 04/25/2015 Document Reviewed: 01/04/2015 Elsevier Interactive Patient Education [employer].

## 2015-08-01 NOTE — ED Notes (Signed)
Respiratory paged for a neb tx.

## 2015-08-01 NOTE — ED Provider Notes (Signed)
CSN: [id number]     Arrival date & time 08/01/15  0930 History   First MD Initiated Contact with Patient 08/01/15 1009     Chief Complaint  Patient presents with  . Shortness of Breath     (Consider location/radiation/quality/duration/timing/severity/associated sxs/prior Treatment) Patient is a 54 y.o. female presenting with shortness of breath. The history is provided by the patient. No language interpreter was used.  Shortness of Breath Severity:  Moderate Onset quality:  Gradual Duration:  1 week Timing:  Constant Progression:  Worsening Chronicity:  New Context: not URI   Relieved by:  Nothing Worsened by:  Nothing tried Ineffective treatments:  None tried Associated symptoms: cough   Associated symptoms: no vomiting   Risk factors: no hx of PE/DVT and no obesity   Pt has a history of copd.  Past Medical History  Diagnosis Date  . Hypertension   . Asthma   . GERD (gastroesophageal reflux disease)   . Bowel obstruction ([place])   . Anxiety   . Depression   . Arthritis   . COPD (chronic obstructive pulmonary disease) [facility])    Past Surgical History  Procedure Laterality Date  . Abdominal surgery  2003    blockage  . Uterine ablation    . Left salpingectomy     Family History  Problem Relation Age of Onset  . Heart disease Mother   . Heart disease Father    Social History  Substance Use Topics  . Smoking status: Current Every Day Smoker -- 0.50 packs/day for 10 years    Types: Cigarettes  . Smokeless tobacco: None  . Alcohol Use: Yes     Comment: 2 beers per week   OB History    No data available     Review of Systems  Respiratory: Positive for cough and shortness of breath.   Gastrointestinal: Negative for vomiting.  All other systems reviewed and are negative.     Allergies  Amlodipine  Home Medications   Prior to Admission medications   Medication Sig Start Date End Date Taking? Authorizing Provider  albuterol (PROVENTIL HFA;VENTOLIN HFA)  108 (90 BASE) MCG/ACT inhaler Inhale 2 puffs into the lungs every 6 (six) hours as needed. Shortness of breath   Yes Historical Provider, MD  aspirin EC 81 MG tablet Take 81 mg by mouth daily.   Yes Historical Provider, MD  budesonide-formoterol (SYMBICORT) 160-4.5 MCG/ACT inhaler Inhale 2 puffs into the lungs 2 (two) times daily.    Yes Historical Provider, MD  cyclobenzaprine (FLEXERIL) 10 MG tablet Take 10 mg by mouth 3 (three) times daily as needed. Muscle Spasms   Yes Historical Provider, MD  HYDROcodone-acetaminophen (NORCO) 7.5-325 MG per tablet Take 1 tablet by mouth every 6 (six) hours as needed. pain   Yes Historical Provider, MD  montelukast (SINGULAIR) 10 MG tablet Take 10 mg by mouth at bedtime.     Yes Historical Provider, MD  nabumetone (RELAFEN) 500 MG tablet Take 500 mg by mouth daily as needed for mild pain.   Yes Historical Provider, MD  naproxen sodium (ANAPROX) 220 MG tablet Take 220-440 mg by mouth daily as needed (pain).   Yes Historical Provider, MD  verapamil (CALAN) 120 MG tablet Take 1 tablet by mouth 3 (three) times daily. 05/28/15  Yes Historical Provider, MD  predniSONE (DELTASONE) 50 MG tablet One tablet PO daily for 5 days Patient not taking: Reported on 08/01/2015 05/02/15   Ripley Fraise, MD   BP 157/99 mmHg  Pulse 110  Temp(Src) 98.3 F (36.8 C) (Oral)  Resp 18  Ht 5\' 7"  (1.702 m)  Wt 63.504 kg  BMI 21.92 kg/m2  SpO2 99% Physical Exam  Constitutional: She is oriented to person, place, and time. She appears well-developed and well-nourished.  HENT:  Head: Normocephalic and atraumatic.  Eyes: EOM are normal.  Neck: Normal range of motion.  Cardiovascular: Normal rate.   Pulmonary/Chest: Effort normal and breath sounds normal.  Abdominal: She exhibits distension.  Musculoskeletal: She exhibits tenderness.  Tender left shoulder, pain with range of motion, nv and ns intact   Neurological: She is alert and oriented to person, place, and time.   Psychiatric: She has a normal mood and affect.  Nursing note and vitals reviewed.   ED Course  Procedures (including critical care time) Labs Review Labs Reviewed - No data to display  Imaging Review Dg Chest 2 View  08/01/2015  CLINICAL DATA:  Shortness of breath, cough. EXAM: CHEST  2 VIEW COMPARISON:  January 24, 2014. FINDINGS: The heart size and mediastinal contours are within normal limits. Both lungs are clear. No pneumothorax or pleural effusion is noted. The visualized skeletal structures are unremarkable. IMPRESSION: No active cardiopulmonary disease. Electronically Signed   By: Marijo Conception, M.D.   On: 08/01/2015 10:04   I have personally reviewed and evaluated these images and lab results as part of my medical decision-making.   EKG Interpretation None      MDM Pt feels better after neb treatment.   Final diagnoses:  Asthma, moderate persistent, uncomplicated     Scheduled Meds: . [START ON 08/02/2015] predniSONE  60 mg Oral Q breakfast   Continuous Infusions:  PRN Meds:.  Fransico Meadow, PA-C 08/01/15 1217  Milton Ferguson, MD 08/02/15 1204

## 2015-08-01 NOTE — ED Notes (Signed)
Pt reports persistent shortness of breath and cough x 1 week. Pt states it began when her cabinets started being worked on. States mold was found. Pt hx of asthma and COPD. VSS. AOx4

## 2015-08-02 ENCOUNTER — Other Ambulatory Visit (HOSPITAL_COMMUNITY): Payer: Self-pay | Admitting: Nurse Practitioner

## 2015-08-02 DIAGNOSIS — Z1231 Encounter for screening mammogram for malignant neoplasm of breast: Secondary | ICD-10-CM

## 2015-08-08 ENCOUNTER — Ambulatory Visit (HOSPITAL_COMMUNITY): Payer: Medicaid Other

## 2015-08-08 ENCOUNTER — Ambulatory Visit (HOSPITAL_COMMUNITY)
Admission: RE | Admit: 2015-08-08 | Discharge: 2015-08-08 | Disposition: A | Discharge status: Home / Self Care | Payer: Medicaid Other | Source: Ambulatory Visit | Attending: Nurse Practitioner | Admitting: Nurse Practitioner

## 2015-08-08 DIAGNOSIS — Z1231 Encounter for screening mammogram for malignant neoplasm of breast: Secondary | ICD-10-CM | POA: Insufficient documentation

## 2015-10-04 ENCOUNTER — Telehealth: Payer: Self-pay | Admitting: *Deleted

## 2015-10-04 MED ORDER — HYDROCODONE-ACETAMINOPHEN 7.5-325 MG PO TABS: 1.0000 | ORAL_TABLET | ORAL | Status: DC | PRN

## 2015-10-04 NOTE — Telephone Encounter (Signed)
Patient aware prescription is available for pick up

## 2015-10-04 NOTE — Telephone Encounter (Signed)
Rx printed

## 2015-10-04 NOTE — Telephone Encounter (Signed)
Patient called requesting norco 325 qty 120 to be refilled.

## 2015-11-07 ENCOUNTER — Ambulatory Visit (INDEPENDENT_AMBULATORY_CARE_PROVIDER_SITE_OTHER): Payer: Medicaid Other | Admitting: Orthopaedic Surgery

## 2015-11-07 VITALS — BP 163/103 | HR 87 | Temp 97.7°F | Ht 67.0 in | Wt 140.0 lb

## 2015-11-07 DIAGNOSIS — M545 Low back pain, unspecified: Secondary | ICD-10-CM

## 2015-11-07 MED ORDER — HYDROCODONE-ACETAMINOPHEN 7.5-325 MG PO TABS: 1.0000 | ORAL_TABLET | ORAL | Status: DC | PRN

## 2015-11-07 MED ORDER — NABUMETONE 500 MG PO TABS: 500.0000 mg | ORAL_TABLET | Freq: Every day | ORAL | Status: DC | PRN

## 2015-11-07 NOTE — Progress Notes (Signed)
Patient [id number] Judy Boyd, female DOB:20-Nov-1960, 55 y.o. [id number]  Chief Complaint  Patient presents with  . Follow-up    low back pain and left hand pain "better"    HPI  Judy Boyd is a 55 y.o. female who has chronic stable lower back pain.  She has no new acute episodes.  She has no paresthesias.  She is taking her medicine. She is doing her exercises.   Back Pain This is a chronic problem. The current episode started more than 1 year ago. The problem occurs daily. The problem has been waxing and waning since onset. The pain is present in the lumbar spine. The quality of the pain is described as aching. The pain does not radiate. The pain is at a severity of 3/10. The pain is mild. The symptoms are aggravated by bending, twisting and stress. She has tried analgesics, heat, home exercises, ice and walking for the symptoms. The treatment provided moderate relief.    Body mass index is 21.92 kg/(m^2).   Review of Systems  Constitutional:       Patient does not have Diabetes Mellitus. Patient does not have hypertension. Patient has COPD or shortness of breath. Patient does not have BMI > 35. Patient has current smoking history.  HENT: Positive for congestion.   Respiratory: Positive for cough and shortness of breath.   Endocrine: Positive for cold intolerance.  Musculoskeletal: Positive for back pain.  Allergic/Immunologic: Positive for environmental allergies.  Psychiatric/Behavioral: The patient is nervous/anxious.   All other systems reviewed and are negative.   Past Medical History  Diagnosis Date  . Hypertension   . Asthma   . GERD (gastroesophageal reflux disease)   . Bowel obstruction ([place])   . Anxiety   . Depression   . Arthritis   . COPD (chronic obstructive pulmonary disease) [facility])     Past Surgical History  Procedure Laterality Date  . Abdominal surgery  2003    blockage  . Uterine ablation    . Left salpingectomy      Family History   Problem Relation Age of Onset  . Heart disease Mother   . Heart disease Father     Social History Social History  Substance Use Topics  . Smoking status: Current Every Day Smoker -- 0.50 packs/day for 10 years    Types: Cigarettes  . Smokeless tobacco: Not on file  . Alcohol Use: Yes     Comment: 2 beers per week    Allergies  Allergen Reactions  . Amlodipine Other (See Comments)    Dehydration     Current Outpatient Prescriptions  Medication Sig Dispense Refill  . albuterol (PROVENTIL HFA;VENTOLIN HFA) 108 (90 BASE) MCG/ACT inhaler Inhale 2 puffs into the lungs every 6 (six) hours as needed. Shortness of breath    . aspirin EC 81 MG tablet Take 81 mg by mouth daily.    . budesonide-formoterol (SYMBICORT) 160-4.5 MCG/ACT inhaler Inhale 2 puffs into the lungs 2 (two) times daily.     . cyclobenzaprine (FLEXERIL) 10 MG tablet Take 10 mg by mouth 3 (three) times daily as needed. Muscle Spasms    . HYDROcodone-acetaminophen (NORCO) 7.5-325 MG tablet Take 1 tablet by mouth every 4 (four) hours as needed for moderate pain (Must last 30 days.  Do not drive or operate machinery while taking this medicine.). 120 tablet 0  . montelukast (SINGULAIR) 10 MG tablet Take 10 mg by mouth at bedtime.      . nabumetone (RELAFEN) 500 MG  tablet Take 1 tablet (500 mg total) by mouth daily as needed for mild pain. 60 tablet 5  . predniSONE (DELTASONE) 10 MG tablet 6,5,4,3,2,1 taper 21 tablet 0  . verapamil (CALAN) 120 MG tablet Take 1 tablet by mouth 3 (three) times daily.  0   No current facility-administered medications for this visit.     Physical Exam  Blood pressure 163/103, pulse 87, temperature 97.7 F (36.5 C), height 5\' 7"  (1.702 m), weight 140 lb (63.504 kg).  Constitutional: overall normal hygiene, normal nutrition, well developed, normal grooming, normal body habitus. Assistive device:none  Musculoskeletal: gait and station Limp none, muscle tone and strength are normal, no  tremors or atrophy is present.  .  Neurological: coordination overall normal.  Deep tendon reflex/nerve stretch intact.  Sensation normal.  Cranial nerves II-XII intact.   Skin:   normal overall no scars, lesions, ulcers or rashes. No psoriasis.  Psychiatric: Alert and oriented x 3.  Recent memory intact, remote memory unclear.  Normal mood and affect. Well groomed.  Good eye contact.  Cardiovascular: overall no swelling, no varicosities, no edema bilaterally, normal temperatures of the legs and arms, no clubbing, cyanosis and good capillary refill.  Lymphatic: palpation is normal.  Spine/Pelvis examination:  Inspection:  Overall, sacoiliac joint benign and hips nontender; without crepitus or defects.   Thoracic spine inspection: Alignment normal without kyphosis present   Lumbar spine inspection:  Alignment  with normal lumbar lordosis, without scoliosis apparent.   Thoracic spine palpation:  without tenderness of spinal processes   Lumbar spine palpation: with tenderness of lumbar area; without tightness of lumbar muscles    Range of Motion:   Lumbar flexion, forward flexion is 40  without pain or tenderness    Lumbar extension is normal  without pain or tenderness   Left lateral bend is Normal  without pain or tenderness   Right lateral bend is Normal without pain or tenderness   Straight leg raising is Normal   Strength & tone: Normal   Stability overall normal stability    The patient has been educated about the nature of the problem(s) and counseled on treatment options.  The patient appeared to understand what I have discussed and is in agreement with it.  Encounter Diagnosis  Name Primary?  . Midline low back pain without sciatica Yes    PLAN Call if any problems.  Precautions discussed.  Continue current medications.   Return to clinic 3 months

## 2015-11-09 ENCOUNTER — Telehealth: Payer: Self-pay | Admitting: Orthopaedic Surgery

## 2015-11-09 NOTE — Telephone Encounter (Signed)
Patient called to inquire about status of a prior authorization on medication: nabumetone (RELAFEN) 500 MG tablet [id number] - said [facility] is awaiting response? Patient's ph# is [phone]

## 2015-11-16 NOTE — Telephone Encounter (Signed)
Insurance plan does not cover Nabumetone  Preferred on her formulary are:  Ibuprofen Indomethacin Ketorolac Meloxicam Naproxen

## 2015-11-19 NOTE — Telephone Encounter (Signed)
Changed to Naprosyn

## 2015-11-20 NOTE — Telephone Encounter (Signed)
Notified patient as per Dr Brooke Bonito note.

## 2015-12-06 ENCOUNTER — Telehealth: Payer: Self-pay | Admitting: Orthopaedic Surgery

## 2015-12-06 MED ORDER — HYDROCODONE-ACETAMINOPHEN 7.5-325 MG PO TABS: 1.0000 | ORAL_TABLET | ORAL | Status: DC | PRN

## 2015-12-06 NOTE — Telephone Encounter (Signed)
Rx done. 

## 2015-12-06 NOTE — Telephone Encounter (Signed)
Patient called and requested a refill on Hydrocodone-Acetaminophen 7.5-325 mgs.  Qty 120        Sig: Take 1 tablet by mouth every 4 (four) hours as needed for moderate pain (Must last 30 days. Do not drive or operate machinery while taking this medicine.).

## 2015-12-10 ENCOUNTER — Telehealth: Payer: Self-pay | Admitting: Orthopaedic Surgery

## 2015-12-10 MED ORDER — PREDNISONE 10 MG PO TABS: ORAL_TABLET | ORAL | Status: DC

## 2015-12-10 NOTE — Telephone Encounter (Signed)
Rx Done . 

## 2015-12-10 NOTE — Telephone Encounter (Signed)
Patient is asking if you will send a prescription for prednisone to [facility].

## 2016-01-07 ENCOUNTER — Telehealth: Payer: Self-pay | Admitting: Orthopaedic Surgery

## 2016-01-07 NOTE — Telephone Encounter (Signed)
Patient called and requested a refill on Hydrocodone-Acetaminophen (Norco)  7.5-325 mgs. Qty 120 Sig: Take 1 tablet by mouth every 4 (four) hours as needed for moderate pain (Must last 30 days.  Do not drive or operate machinery while taking this medicine.). °

## 2016-01-08 MED ORDER — HYDROCODONE-ACETAMINOPHEN 7.5-325 MG PO TABS: 1.0000 | ORAL_TABLET | ORAL | Status: DC | PRN

## 2016-01-08 NOTE — Telephone Encounter (Signed)
Rx Done . 

## 2016-02-07 ENCOUNTER — Ambulatory Visit (INDEPENDENT_AMBULATORY_CARE_PROVIDER_SITE_OTHER): Payer: Medicaid Other | Admitting: Orthopaedic Surgery

## 2016-02-07 ENCOUNTER — Encounter: Payer: Self-pay | Admitting: Orthopaedic Surgery

## 2016-02-07 VITALS — BP 160/108 | HR 103 | Temp 98.1°F | Ht 68.0 in | Wt 137.0 lb

## 2016-02-07 DIAGNOSIS — M545 Low back pain, unspecified: Secondary | ICD-10-CM

## 2016-02-07 DIAGNOSIS — M6530 Trigger finger, unspecified finger: Secondary | ICD-10-CM | POA: Diagnosis not present

## 2016-02-07 MED ORDER — HYDROCODONE-ACETAMINOPHEN 7.5-325 MG PO TABS: 1.0000 | ORAL_TABLET | ORAL | Status: DC | PRN

## 2016-02-07 NOTE — Progress Notes (Signed)
Patient Judy Boyd, female DOB:10/27/1960, 55 y.o. [id number]  Chief Complaint  Patient presents with  . Follow-up    Bilateral hand and low back pain    HPI  Judy Boyd is a 55 y.o. female who has chronic lower back pain.  She has no new episodes of marked pain. She is stable.  She is doing her exercises.  She has no paresthesias.  She is taking her medicine.  She has developed a trigger finger of the left thumb.  It is worse when she first wakes up and after using it a lot during the day.  It is getting worse.  She was told about surgery but she prefers to have an injection instead.  I have told her that surgery is the definitive cure.  She says she understands.  She has no numbness of the thumb.  She is right hand dominant.  HPI  Body mass index is 20.84 kg/(m^2).  ROS  Review of Systems  Constitutional:       Patient does not have Diabetes Mellitus. Patient does not have hypertension. Patient has COPD or shortness of breath. Patient does not have BMI > 35. Patient has current smoking history.  HENT: Positive for congestion.   Respiratory: Positive for cough and shortness of breath.   Endocrine: Positive for cold intolerance.  Musculoskeletal: Positive for back pain.  Allergic/Immunologic: Positive for environmental allergies.  Psychiatric/Behavioral: The patient is nervous/anxious.   All other systems reviewed and are negative.   Past Medical History  Diagnosis Date  . Hypertension   . Asthma   . GERD (gastroesophageal reflux disease)   . Bowel obstruction ([place])   . Anxiety   . Depression   . Arthritis   . COPD (chronic obstructive pulmonary disease) [facility])     Past Surgical History  Procedure Laterality Date  . Abdominal surgery  2003    blockage  . Uterine ablation    . Left salpingectomy      Family History  Problem Relation Age of Onset  . Heart disease Mother   . Heart disease Father     Social History Social History  Substance  Use Topics  . Smoking status: Current Every Day Smoker -- 0.50 packs/day for 10 years    Types: Cigarettes  . Smokeless tobacco: None  . Alcohol Use: Yes     Comment: 2 beers per week    Allergies  Allergen Reactions  . Amlodipine Other (See Comments)    Dehydration     Current Outpatient Prescriptions  Medication Sig Dispense Refill  . albuterol (PROVENTIL HFA;VENTOLIN HFA) 108 (90 BASE) MCG/ACT inhaler Inhale 2 puffs into the lungs every 6 (six) hours as needed. Shortness of breath    . aspirin EC 81 MG tablet Take 81 mg by mouth daily.    . budesonide-formoterol (SYMBICORT) 160-4.5 MCG/ACT inhaler Inhale 2 puffs into the lungs 2 (two) times daily.     . clorazepate (TRANXENE) 7.5 MG tablet Take 7.5 mg by mouth 2 (two) times daily as needed for anxiety.    . cyclobenzaprine (FLEXERIL) 10 MG tablet Take 10 mg by mouth 3 (three) times daily as needed. Muscle Spasms    . hydrochlorothiazide (HYDRODIURIL) 12.5 MG tablet TK 1 T PO BID  3  . HYDROcodone-acetaminophen (NORCO) 7.5-325 MG tablet Take 1 tablet by mouth every 4 (four) hours as needed for moderate pain (Must last 30 days.  Do not drive or operate machinery while taking this medicine.). 120 tablet 0  .  montelukast (SINGULAIR) 10 MG tablet Take 10 mg by mouth at bedtime.      . nabumetone (RELAFEN) 500 MG tablet Take 1 tablet (500 mg total) by mouth daily as needed for mild pain. 60 tablet 5  . predniSONE (DELTASONE) 10 MG tablet 6,5,4,3,2,1 taper 21 tablet 0  . verapamil (CALAN) 120 MG tablet Take 1 tablet by mouth 3 (three) times daily.  0   No current facility-administered medications for this visit.     Physical Exam  Blood pressure 160/108, pulse 103, temperature 98.1 F (36.7 C), height 5\' 8"  (1.727 m), weight 137 lb (62.143 kg).  Constitutional: overall normal hygiene, normal nutrition, well developed, normal grooming, normal body habitus. Assistive device:none  Musculoskeletal: gait and station Limp none, muscle  tone and strength are normal, no tremors or atrophy is present.  .  Neurological: coordination overall normal.  Deep tendon reflex/nerve stretch intact.  Sensation normal.  Cranial nerves II-XII intact.   Skin:   normal overall no scars, lesions, ulcers or rashes. No psoriasis.  Psychiatric: Alert and oriented x 3.  Recent memory intact, remote memory unclear.  Normal mood and affect. Well groomed.  Good eye contact.  Cardiovascular: overall no swelling, no varicosities, no edema bilaterally, normal temperatures of the legs and arms, no clubbing, cyanosis and good capillary refill.  Lymphatic: palpation is normal.  The left thumb has definite triggering and popping.  NV is intact.  She has pain over the A1 pulley area.  The rest of the fingers on both hands are negative.  Spine/Pelvis examination:  Inspection:  Overall, sacoiliac joint benign and hips nontender; without crepitus or defects.   Thoracic spine inspection: Alignment normal without kyphosis present   Lumbar spine inspection:  Alignment  with normal lumbar lordosis, without scoliosis apparent.   Thoracic spine palpation:  without tenderness of spinal processes   Lumbar spine palpation: with tenderness of lumbar area; without tightness of lumbar muscles    Range of Motion:   Lumbar flexion, forward flexion is 40  without pain or tenderness    Lumbar extension is 10  without pain or tenderness   Left lateral bend is Normal  without pain or tenderness   Right lateral bend is Normal without pain or tenderness   Straight leg raising is Normal   Strength & tone: Normal   Stability overall normal stability   PROCEDURE NOTE: The left thumb was prepped after having permission from the patient for an injection into the A1 pulley area.  The area of the A1 pulley was injected by sterile technique with 1 % Xylocaine and 1 cc DepoMedrol 40 tolerated well.   The patient has been educated about the nature of the problem(s) and  counseled on treatment options.  The patient appeared to understand what I have discussed and is in agreement with it.  Encounter Diagnoses  Name Primary?  . Trigger finger, acquired Yes  . Midline low back pain without sciatica     PLAN Call if any problems.  Precautions discussed.  Continue current medications.   Return to clinic 3 months  Electronically Signed Sanjuana Kava, MD 6/22/20179:37 AM

## 2016-03-05 ENCOUNTER — Telehealth: Payer: Self-pay | Admitting: Orthopaedic Surgery

## 2016-03-05 MED ORDER — HYDROCODONE-ACETAMINOPHEN 7.5-325 MG PO TABS: 1.0000 | ORAL_TABLET | ORAL | Status: DC | PRN

## 2016-03-05 NOTE — Telephone Encounter (Signed)
Patient called and requested a refill on Hydrocodone-Acetaminophen (Norco)  7.5-325 mgs. Qty 120  Sig: Take 1 tablet by mouth every 4 (four) hours as needed for moderate pain (Must last 30 days. Do not drive or operate machinery while taking this medicine   Patient also requests a refill on Prednisone 10 mgs.

## 2016-03-05 NOTE — Telephone Encounter (Signed)
Rx Done . 

## 2016-03-06 ENCOUNTER — Telehealth: Payer: Self-pay | Admitting: Orthopaedic Surgery

## 2016-03-06 MED ORDER — PREDNISONE 10 MG PO TABS: ORAL_TABLET | ORAL | Status: DC

## 2016-03-06 NOTE — Telephone Encounter (Signed)
Patient requested a refill on Prednisone 10 mgs.

## 2016-03-06 NOTE — Telephone Encounter (Signed)
Rx done. 

## 2016-04-07 ENCOUNTER — Telehealth: Payer: Self-pay | Admitting: Orthopaedic Surgery

## 2016-04-07 MED ORDER — HYDROCODONE-ACETAMINOPHEN 5-325 MG PO TABS: 1.0000 | ORAL_TABLET | ORAL | 0 refills | Status: DC | PRN

## 2016-04-07 NOTE — Telephone Encounter (Signed)
Hydrocodone-Acetaminophen 7.5/325mg Qty 120 Tablets °

## 2016-04-22 ENCOUNTER — Telehealth: Payer: Self-pay | Admitting: Orthopaedic Surgery

## 2016-04-22 MED ORDER — HYDROCODONE-ACETAMINOPHEN 5-325 MG PO TABS: 1.0000 | ORAL_TABLET | ORAL | 0 refills | Status: DC | PRN

## 2016-04-22 NOTE — Telephone Encounter (Signed)
Hydrocodone-Acetaminophen 5/325mg Qty 56 Tablets ° °Patient is Medicaid.  °

## 2016-05-08 ENCOUNTER — Ambulatory Visit (INDEPENDENT_AMBULATORY_CARE_PROVIDER_SITE_OTHER): Payer: Medicaid Other | Admitting: Orthopaedic Surgery

## 2016-05-08 ENCOUNTER — Encounter: Payer: Self-pay | Admitting: Orthopaedic Surgery

## 2016-05-08 VITALS — BP 175/109 | HR 103 | Temp 97.7°F | Ht 68.0 in | Wt 138.0 lb

## 2016-05-08 DIAGNOSIS — M545 Low back pain, unspecified: Secondary | ICD-10-CM

## 2016-05-08 DIAGNOSIS — G894 Chronic pain syndrome: Secondary | ICD-10-CM | POA: Diagnosis not present

## 2016-05-08 MED ORDER — HYDROCODONE-ACETAMINOPHEN 5-325 MG PO TABS: 1.0000 | ORAL_TABLET | Freq: Four times a day (QID) | ORAL | 0 refills | Status: DC | PRN

## 2016-05-08 NOTE — Progress Notes (Signed)
Patient [id number] D Dagostino, female DOB:[birth year], 55 y.o. [id number]  Chief Complaint  Patient presents with  . Follow-up    bilateral hand pain, back pain    HPI  Judy Boyd is a 55 y.o. female who has chronic pain of the lower back. She has no new acute episodes.  She has no paresthesias.  I will have her see a pain management physician to help with pain management. HPI  Body mass index is 20.98 kg/m.  ROS  Review of Systems  Constitutional:       Patient does not have Diabetes Mellitus. Patient does not have hypertension. Patient has COPD or shortness of breath. Patient does not have BMI > 35. Patient has current smoking history.  HENT: Positive for congestion.   Respiratory: Positive for cough and shortness of breath.   Endocrine: Positive for cold intolerance.  Musculoskeletal: Positive for back pain.  Allergic/Immunologic: Positive for environmental allergies.  Psychiatric/Behavioral: The patient is nervous/anxious.   All other systems reviewed and are negative.   Past Medical History:  Diagnosis Date  . Anxiety   . Arthritis   . Asthma   . Bowel obstruction ([place])   . COPD (chronic obstructive pulmonary disease) ([place])   . Depression   . GERD (gastroesophageal reflux disease)   . Hypertension     Past Surgical History:  Procedure Laterality Date  . ABDOMINAL SURGERY  2003   blockage  . left salpingectomy    . uterine ablation      Family History  Problem Relation Age of Onset  . Heart disease Mother   . Heart disease Father     Social History Social History  Substance Use Topics  . Smoking status: Current Every Day Smoker    Packs/day: 0.50    Years: 10.00    Types: Cigarettes  . Smokeless tobacco: Not on file  . Alcohol use Yes     Comment: 2 beers per week    Allergies  Allergen Reactions  . Amlodipine Other (See Comments)    Dehydration     Current Outpatient Prescriptions  Medication Sig Dispense Refill  . albuterol  (PROVENTIL HFA;VENTOLIN HFA) 108 (90 BASE) MCG/ACT inhaler Inhale 2 puffs into the lungs every 6 (six) hours as needed. Shortness of breath    . aspirin EC 81 MG tablet Take 81 mg by mouth daily.    . budesonide-formoterol (SYMBICORT) 160-4.5 MCG/ACT inhaler Inhale 2 puffs into the lungs 2 (two) times daily.     . clorazepate (TRANXENE) 7.5 MG tablet Take 7.5 mg by mouth 2 (two) times daily as needed for anxiety.    . cyclobenzaprine (FLEXERIL) 10 MG tablet Take 10 mg by mouth 3 (three) times daily as needed. Muscle Spasms    . hydrochlorothiazide (HYDRODIURIL) 12.5 MG tablet TK 1 T PO BID  3  . HYDROcodone-acetaminophen (NORCO/VICODIN) 5-325 MG tablet Take 1 tablet by mouth every 6 (six) hours as needed for moderate pain (Must last 14 days.Do not take and drive a car or use machinery.). 56 tablet 0  . montelukast (SINGULAIR) 10 MG tablet Take 10 mg by mouth at bedtime.      . nabumetone (RELAFEN) 500 MG tablet Take 1 tablet (500 mg total) by mouth daily as needed for mild pain. 60 tablet 5  . predniSONE (DELTASONE) 10 MG tablet 6,5,4,3,2,1 taper 21 tablet 0  . verapamil (CALAN) 120 MG tablet Take 1 tablet by mouth 3 (three) times daily.  0   No current facility-administered medications  for this visit.      Physical Exam  Blood pressure (!) 175/109, pulse (!) 103, temperature 97.7 F (36.5 C), height 5\' 8"  (1.727 m), weight 138 lb (62.6 kg).  Constitutional: overall normal hygiene, normal nutrition, well developed, normal grooming, normal body habitus. Assistive device:none  Musculoskeletal: gait and station Limp none, muscle tone and strength are normal, no tremors or atrophy is present.  .  Neurological: coordination overall normal.  Deep tendon reflex/nerve stretch intact.  Sensation normal.  Cranial nerves II-XII intact.   Skin:   Normal overall no scars, lesions, ulcers or rashes. No psoriasis.  Psychiatric: Alert and oriented x 3.  Recent memory intact, remote memory unclear.   Normal mood and affect. Well groomed.  Good eye contact.  Cardiovascular: overall no swelling, no varicosities, no edema bilaterally, normal temperatures of the legs and arms, no clubbing, cyanosis and good capillary refill.  Lymphatic: palpation is normal.  Spine/Pelvis examination:  Inspection:  Overall, sacoiliac joint benign and hips nontender; without crepitus or defects.   Thoracic spine inspection: Alignment normal without kyphosis present   Lumbar spine inspection:  Alignment  with normal lumbar lordosis, without scoliosis apparent.   Thoracic spine palpation:  without tenderness of spinal processes   Lumbar spine palpation: with tenderness of lumbar area; without tightness of lumbar muscles    Range of Motion:   Lumbar flexion, forward flexion is 35 without pain or tenderness    Lumbar extension is 10 without pain or tenderness   Left lateral bend is Normal  without pain or tenderness   Right lateral bend is Normal without pain or tenderness   Straight leg raising is Normal   Strength & tone: Normal   Stability overall normal stability     The patient has been educated about the nature of the problem(s) and counseled on treatment options.  The patient appeared to understand what I have discussed and is in agreement with it.  Encounter Diagnoses  Name Primary?  . Chronic pain syndrome Yes  . Midline low back pain without sciatica     PLAN Call if any problems.  Precautions discussed.  Continue current medications.   Return to clinic 3 months   Electronically Signed Sanjuana Kava, MD 9/21/20172:46 PM

## 2016-05-21 ENCOUNTER — Telehealth: Payer: Self-pay | Admitting: Orthopaedic Surgery

## 2016-05-21 NOTE — Telephone Encounter (Signed)
yes

## 2016-05-21 NOTE — Telephone Encounter (Signed)
Hydrocodone-Acetaminophen  5/325mg   Qty 56 Tablets  Take 1 tablet by mouth every 6 (six) hours as needed for moderate   pain. (Must last 14 days. Do not take and drive a car or use   machinery.)

## 2016-05-21 NOTE — Telephone Encounter (Signed)
Routing to Dr. Harrison to advise 

## 2016-05-26 ENCOUNTER — Other Ambulatory Visit: Payer: Self-pay | Admitting: *Deleted

## 2016-05-26 MED ORDER — HYDROCODONE-ACETAMINOPHEN 5-325 MG PO TABS: 1.0000 | ORAL_TABLET | Freq: Four times a day (QID) | ORAL | 0 refills | Status: DC | PRN

## 2016-06-09 ENCOUNTER — Telehealth: Payer: Self-pay | Admitting: Orthopaedic Surgery

## 2016-06-09 NOTE — Telephone Encounter (Signed)
Hydrocodone-Acetaminophen  5/325mg  Qty 56 Tablets °

## 2016-06-10 MED ORDER — HYDROCODONE-ACETAMINOPHEN 5-325 MG PO TABS: 1.0000 | ORAL_TABLET | Freq: Four times a day (QID) | ORAL | 0 refills | Status: DC | PRN

## 2016-06-30 ENCOUNTER — Telehealth: Payer: Self-pay | Admitting: Orthopaedic Surgery

## 2016-06-30 NOTE — Telephone Encounter (Signed)
Patient of Dr Luna Glasgow called for refill of medication:  HYDROcodone-acetaminophen (NORCO/VICODIN) 5-325 MG tablet 45 tablet  - aware Dr Aline Brochure is reviewing refill requests while Dr Luna Glasgow is out of office this week.

## 2016-07-01 ENCOUNTER — Other Ambulatory Visit: Payer: Self-pay | Admitting: *Deleted

## 2016-07-01 MED ORDER — HYDROCODONE-ACETAMINOPHEN 5-325 MG PO TABS: 1.0000 | ORAL_TABLET | Freq: Four times a day (QID) | ORAL | 0 refills | Status: DC | PRN

## 2016-07-04 ENCOUNTER — Emergency Department (HOSPITAL_COMMUNITY): Payer: Medicaid Other

## 2016-07-04 ENCOUNTER — Emergency Department (HOSPITAL_COMMUNITY)
Admission: EM | Admit: 2016-07-04 | Discharge: 2016-07-04 | Disposition: A | Discharge status: Home / Self Care | Payer: Medicaid Other | Attending: Emergency Medicine | Admitting: Emergency Medicine

## 2016-07-04 ENCOUNTER — Encounter (HOSPITAL_COMMUNITY): Payer: Self-pay | Admitting: Emergency Medicine

## 2016-07-04 DIAGNOSIS — J45909 Unspecified asthma, uncomplicated: Secondary | ICD-10-CM | POA: Diagnosis not present

## 2016-07-04 DIAGNOSIS — J449 Chronic obstructive pulmonary disease, unspecified: Secondary | ICD-10-CM | POA: Insufficient documentation

## 2016-07-04 DIAGNOSIS — Z79899 Other long term (current) drug therapy: Secondary | ICD-10-CM | POA: Diagnosis not present

## 2016-07-04 DIAGNOSIS — Y93F2 Activity, caregiving, lifting: Secondary | ICD-10-CM | POA: Insufficient documentation

## 2016-07-04 DIAGNOSIS — M79639 Pain in unspecified forearm: Secondary | ICD-10-CM

## 2016-07-04 DIAGNOSIS — M79631 Pain in right forearm: Secondary | ICD-10-CM | POA: Diagnosis not present

## 2016-07-04 DIAGNOSIS — I10 Essential (primary) hypertension: Secondary | ICD-10-CM | POA: Insufficient documentation

## 2016-07-04 DIAGNOSIS — X500XXA Overexertion from strenuous movement or load, initial encounter: Secondary | ICD-10-CM | POA: Insufficient documentation

## 2016-07-04 DIAGNOSIS — Z7982 Long term (current) use of aspirin: Secondary | ICD-10-CM | POA: Insufficient documentation

## 2016-07-04 DIAGNOSIS — Y929 Unspecified place or not applicable: Secondary | ICD-10-CM | POA: Insufficient documentation

## 2016-07-04 DIAGNOSIS — Y990 Civilian activity done for income or pay: Secondary | ICD-10-CM | POA: Diagnosis not present

## 2016-07-04 DIAGNOSIS — F17210 Nicotine dependence, cigarettes, uncomplicated: Secondary | ICD-10-CM | POA: Diagnosis not present

## 2016-07-04 DIAGNOSIS — M7918 Myalgia, other site: Secondary | ICD-10-CM

## 2016-07-04 DIAGNOSIS — S59911A Unspecified injury of right forearm, initial encounter: Secondary | ICD-10-CM | POA: Diagnosis present

## 2016-07-04 HISTORY — DX: Other chronic pain: G89.29

## 2016-07-04 MED ORDER — HYDROCHLOROTHIAZIDE 12.5 MG PO CAPS
12.5000 mg | ORAL_CAPSULE | Freq: Every day | ORAL | Status: DC
  Administered 2016-07-04: 12.5 mg via ORAL
  Filled 2016-07-04 (×4): qty 1

## 2016-07-04 MED ORDER — METHOCARBAMOL 500 MG PO TABS: 1000.0000 mg | ORAL_TABLET | Freq: Four times a day (QID) | ORAL | 0 refills | Status: DC | PRN

## 2016-07-04 MED ORDER — VERAPAMIL HCL 120 MG PO TABS
120.0000 mg | ORAL_TABLET | Freq: Once | ORAL | Status: AC
  Administered 2016-07-04: 120 mg via ORAL
  Filled 2016-07-04: qty 1

## 2016-07-04 NOTE — Discharge Instructions (Signed)
Take the prescription as directed.  Apply moist heat or ice to the area(s) of discomfort, for 15 minutes at a time, several times per day for the next few days.  Do not fall asleep on a heating or ice pack.  Call your regular medical doctor and your Orthopedic doctor today to schedule a follow up appointment within the next week.  Return to the Emergency Department immediately if worsening. ° °

## 2016-07-04 NOTE — ED Triage Notes (Addendum)
PT c/o right arm/wrist pain with ROM x2 weeks but denies any injury. PT states she has not her HTN medications today and states she took a dose last night.

## 2016-07-04 NOTE — ED Provider Notes (Signed)
[facility] DEPT Provider Note   CSN: [id number] Arrival date & time: 07/04/16  1218     History   Chief Complaint Chief Complaint  Patient presents with  . Arm Pain    HPI REHA LISTER is a 55 y.o. female.  HPI  Pt was seen at 1430. Per pt, c/o gradual onset and persistence of constant right forearm "pain" that began 3 weeks ago. Pt states she was at work "lifting and dumping boxes" before the pain began. Describes the pain as "aching," located in her right dorsal-lateral forearm area. Pain worsens with palpation of the area and movement of her arm. Denies direct injury. Denies focal motor weakness, no tingling/numbness in extremities, no CP/SOB, no fevers, no rash.    Past Medical History:  Diagnosis Date  . Anxiety   . Arthritis   . Asthma   . Bowel obstruction   . Chronic pain   . COPD (chronic obstructive pulmonary disease) ([place])   . Depression   . GERD (gastroesophageal reflux disease)   . Hypertension     Patient Active Problem List   Diagnosis Date Noted  . GERD (gastroesophageal reflux disease) 04/06/2012  . Abdominal pain 04/06/2012  . Nausea & vomiting 04/06/2012  . Melena 04/06/2012    Past Surgical History:  Procedure Laterality Date  . ABDOMINAL SURGERY  2003   blockage  . left salpingectomy    . uterine ablation      OB History    No data available       Home Medications    Prior to Admission medications   Medication Sig Start Date End Date Taking? Authorizing Provider  albuterol (PROVENTIL HFA;VENTOLIN HFA) 108 (90 BASE) MCG/ACT inhaler Inhale 2 puffs into the lungs every 6 (six) hours as needed. Shortness of breath    Historical Provider, MD  aspirin EC 81 MG tablet Take 81 mg by mouth daily.    Historical Provider, MD  budesonide-formoterol (SYMBICORT) 160-4.5 MCG/ACT inhaler Inhale 2 puffs into the lungs 2 (two) times daily.     Historical Provider, MD  clorazepate (TRANXENE) 7.5 MG tablet Take 7.5 mg by mouth 2 (two) times  daily as needed for anxiety.    Historical Provider, MD  cyclobenzaprine (FLEXERIL) 10 MG tablet Take 10 mg by mouth 3 (three) times daily as needed. Muscle Spasms    Historical Provider, MD  hydrochlorothiazide (HYDRODIURIL) 12.5 MG tablet TK 1 T PO BID 11/29/15   Historical Provider, MD  HYDROcodone-acetaminophen (NORCO/VICODIN) 5-325 MG tablet Take 1 tablet by mouth every 6 (six) hours as needed for moderate pain (Must last 14 days.Do not take and drive a car or use machinery.). 07/01/16   Carole Civil, MD  montelukast (SINGULAIR) 10 MG tablet Take 10 mg by mouth at bedtime.      Historical Provider, MD  nabumetone (RELAFEN) 500 MG tablet Take 1 tablet (500 mg total) by mouth daily as needed for mild pain. 11/07/15   Sanjuana Kava, MD  predniSONE (DELTASONE) 10 MG tablet 6,5,4,3,2,1 taper 03/06/16   Sanjuana Kava, MD  verapamil (CALAN) 120 MG tablet Take 1 tablet by mouth 3 (three) times daily. 05/28/15   Historical Provider, MD    Family History Family History  Problem Relation Age of Onset  . Heart disease Mother   . Heart disease Father     Social History Social History  Substance Use Topics  . Smoking status: Current Every Day Smoker    Packs/day: 0.50    Years: 10.00  Types: Cigarettes  . Smokeless tobacco: Never Used  . Alcohol use Yes     Comment: 2 beers per week     Allergies   Amlodipine   Review of Systems Review of Systems ROS: Statement: All systems negative except as marked or noted in the HPI; Constitutional: Negative for fever and chills. ; ; Eyes: Negative for eye pain, redness and discharge. ; ; ENMT: Negative for ear pain, hoarseness, nasal congestion, sinus pressure and sore throat. ; ; Cardiovascular: Negative for chest pain, palpitations, diaphoresis, dyspnea and peripheral edema. ; ; Respiratory: Negative for cough, wheezing and stridor. ; ; Gastrointestinal: Negative for nausea, vomiting, diarrhea, abdominal pain, blood in stool, hematemesis,  jaundice and rectal bleeding. . ; ; Genitourinary: Negative for dysuria, flank pain and hematuria. ; ; Musculoskeletal: +right forearm pain. Negative for back pain and neck pain. Negative for direct trauma.; ; Skin: Negative for pruritus, rash, abrasions, blisters, bruising and skin lesion.; ; Neuro: Negative for headache, lightheadedness and neck stiffness. Negative for weakness, altered level of consciousness, altered mental status, extremity weakness, paresthesias, involuntary movement, seizure and syncope.      Physical Exam Updated Vital Signs BP (!) 199/114 (BP Location: Left Arm)   Pulse 90   Temp 97.7 F (36.5 C) (Oral)   Resp 16   Ht 5\' 5"  (1.651 m)   Wt 132 lb (59.9 kg)   SpO2 99%   BMI 21.97 kg/m   Physical Exam 1435: Physical examination:  Nursing notes reviewed; Vital signs and O2 SAT reviewed;  Constitutional: Well developed, Well nourished, Well hydrated, In no acute distress; Head:  Normocephalic, atraumatic; Eyes: EOMI, PERRL, No scleral icterus; ENMT: Mouth and pharynx normal, Mucous membranes moist; Neck: Supple, Full range of motion, No lymphadenopathy; Cardiovascular: Regular rate and rhythm, No murmur, rub, or gallop; Respiratory: Breath sounds clear & equal bilaterally, No rales, rhonchi, wheezes.  Speaking full sentences with ease, Normal respiratory effort/excursion; Chest: Nontender, Movement normal; Abdomen: Soft, Nontender, Nondistended, Normal bowel sounds; Genitourinary: No CVA tenderness; Extremities: Pulses normal, NT right fingers/wrist/elbow/shoulder. +TTP right dorsal-lateral forearm muscles, no deformity, no ecchymosis, no rash, no fluctuance, no edema. Strong right radial pulse, muscles compartments soft. No calf edema or asymmetry.; Neuro: AA&Ox3, Major CN grossly intact.  Speech clear. No gross focal motor or sensory deficits in extremities. Climbs on and off stretcher easily by herself. Gait steady.; Skin: Color normal, Warm, Dry.   ED Treatments /  Results  Labs (all labs ordered are listed, but only abnormal results are displayed)   EKG  EKG Interpretation None       Radiology   Procedures Procedures (including critical care time)  Medications Ordered in ED Medications  verapamil (CALAN) tablet 120 mg (not administered)  hydrochlorothiazide (MICROZIDE) capsule 12.5 mg (not administered)     Initial Impression / Assessment and Plan / ED Course  I have reviewed the triage vital signs and the nursing notes.  Pertinent labs & imaging results that were available during my care of the patient were reviewed by me and considered in my medical decision making (see chart for details).  MDM Reviewed: previous chart, nursing note and vitals Interpretation: x-ray and ultrasound    Dg Elbow Complete Right Result Date: 07/04/2016 CLINICAL DATA:  Pain for 2 weeks EXAM: RIGHT ELBOW - COMPLETE 3+ VIEW COMPARISON:  None. FINDINGS: Frontal, lateral, and bilateral oblique views were obtained. There is no fracture or dislocation. No joint effusion. The joint spaces appear normal. No erosive change. IMPRESSION: No fracture  or dislocation.  No evident arthropathy. Electronically Signed   By: Lowella Grip III M.D.   On: 07/04/2016 15:30   Dg Wrist Complete Right Result Date: 07/04/2016 CLINICAL DATA:  Pain for 2 weeks EXAM: RIGHT WRIST - COMPLETE 3+ VIEW COMPARISON:  None. FINDINGS: Frontal, oblique, lateral, and ulnar deviation scaphoid images were obtained. There is no fracture or dislocation. The joint spaces appear normal. No erosive change. IMPRESSION: No fracture or dislocation.  No evident arthropathy. Electronically Signed   By: Lowella Grip III M.D.   On: 07/04/2016 15:30   US Venous Img Upper Uni Right Result Date: 07/04/2016 CLINICAL DATA:  RIGHT FOREARM PAIN FOR 2 WEEKS. EXAM: RIGHT UPPER EXTREMITY VENOUS DOPPLER ULTRASOUND TECHNIQUE: Gray-scale sonography with graded compression, as well as color Doppler and duplex  ultrasound were performed to evaluate the upper extremity deep venous system from the level of the subclavian vein and including the jugular, axillary, basilic, radial, ulnar and upper cephalic vein. Spectral Doppler was utilized to evaluate flow at rest and with distal augmentation maneuvers. COMPARISON:  None. FINDINGS: Contralateral Subclavian Vein: Respiratory phasicity is normal and symmetric with the symptomatic side. No evidence of thrombus. Normal compressibility. Internal Jugular Vein: No evidence of thrombus. Normal compressibility, respiratory phasicity and response to augmentation. Subclavian Vein: No evidence of thrombus. Normal compressibility, respiratory phasicity and response to augmentation. Axillary Vein: No evidence of thrombus. Normal compressibility, respiratory phasicity and response to augmentation. Cephalic Vein: No evidence of thrombus. Normal compressibility, respiratory phasicity and response to augmentation. Basilic Vein: No evidence of thrombus. Normal compressibility, respiratory phasicity and response to augmentation. Brachial Veins: No evidence of thrombus. Normal compressibility, respiratory phasicity and response to augmentation. Radial Veins: No evidence of thrombus. Normal compressibility, respiratory phasicity and response to augmentation. Ulnar Veins: No evidence of thrombus. Normal compressibility, respiratory phasicity and response to augmentation. Venous Reflux:  None visualized. Other Findings:  None visualized. IMPRESSION: No evidence of deep venous thrombosis. Electronically Signed   By: Jerilynn Mages.  Shick M.D.   On: 07/04/2016 15:18    1435:  Pt states she did not take her BP meds today. Denies CP/SOB, no visual changes, no headache, no focal motor weakness. Will dose pt's home BP meds.   1550:  Workup reassuring. Tx symptomatically. Pt already receiving narcotics rx from Ortho MD. Dx and testing d/w pt.  Questions answered.  Verb understanding, agreeable to d/c home with  outpt f/u.    Final Clinical Impressions(s) / ED Diagnoses   Final diagnoses:  Forearm pain    New Prescriptions New Prescriptions   No medications on file     Francine Graven, DO 07/07/16 2134

## 2016-07-17 ENCOUNTER — Telehealth: Payer: Self-pay | Admitting: Orthopaedic Surgery

## 2016-07-17 MED ORDER — HYDROCODONE-ACETAMINOPHEN 5-325 MG PO TABS: 1.0000 | ORAL_TABLET | Freq: Four times a day (QID) | ORAL | 0 refills | Status: DC | PRN

## 2016-07-17 NOTE — Telephone Encounter (Signed)
Hydrocodone-Acetaminophen  5/325mg  Qty 45 Tablets °

## 2016-07-31 ENCOUNTER — Telehealth: Payer: Self-pay | Admitting: Orthopaedic Surgery

## 2016-07-31 NOTE — Telephone Encounter (Signed)
Hydrocodone-Acetaminophen  5/325mg  Qty 40 Tablets °

## 2016-08-05 ENCOUNTER — Telehealth: Payer: Self-pay | Admitting: Orthopaedic Surgery

## 2016-08-05 ENCOUNTER — Ambulatory Visit: Payer: Medicaid Other | Admitting: Orthopaedic Surgery

## 2016-08-05 MED ORDER — HYDROCODONE-ACETAMINOPHEN 5-325 MG PO TABS: 1.0000 | ORAL_TABLET | Freq: Four times a day (QID) | ORAL | 0 refills | Status: DC | PRN

## 2016-08-05 NOTE — Telephone Encounter (Signed)
Patient has just called regarding her appointment which was scheduled for today, 08/05/16, with Dr Luna Glasgow.  States she had transportation problem; also said that she has been scheduled for the referral appointment per Dr Luna Glasgow with the pain management specialist at [facility] on 08/21/16 (states was a re-schedule).  Patient is asking if she is to continue to follow up with both Dr Luna Glasgow, and with pain management?  Please call at [phone]

## 2016-08-05 NOTE — Telephone Encounter (Signed)
Unable to return the patients phone call because the phone number has been disconnected.

## 2016-08-06 ENCOUNTER — Encounter: Payer: Medicaid Other | Admitting: Physical Medicine & Rehabilitation

## 2016-08-07 ENCOUNTER — Ambulatory Visit: Payer: Medicaid Other | Admitting: Orthopaedic Surgery

## 2016-08-21 ENCOUNTER — Encounter: Payer: Medicaid Other | Attending: Physical Medicine & Rehabilitation | Admitting: Physical Medicine & Rehabilitation

## 2016-12-11 ENCOUNTER — Encounter: Payer: Medicaid Other | Attending: Physical Medicine & Rehabilitation | Admitting: Physical Medicine & Rehabilitation

## 2016-12-11 ENCOUNTER — Encounter: Payer: Self-pay | Admitting: Physical Medicine & Rehabilitation

## 2016-12-11 VITALS — BP 188/119 | HR 105 | Resp 14

## 2016-12-11 DIAGNOSIS — Z8249 Family history of ischemic heart disease and other diseases of the circulatory system: Secondary | ICD-10-CM | POA: Diagnosis not present

## 2016-12-11 DIAGNOSIS — M545 Low back pain, unspecified: Secondary | ICD-10-CM

## 2016-12-11 DIAGNOSIS — M791 Myalgia, unspecified site: Secondary | ICD-10-CM

## 2016-12-11 DIAGNOSIS — F419 Anxiety disorder, unspecified: Secondary | ICD-10-CM | POA: Insufficient documentation

## 2016-12-11 DIAGNOSIS — Z9889 Other specified postprocedural states: Secondary | ICD-10-CM | POA: Insufficient documentation

## 2016-12-11 DIAGNOSIS — G8929 Other chronic pain: Secondary | ICD-10-CM

## 2016-12-11 DIAGNOSIS — I10 Essential (primary) hypertension: Secondary | ICD-10-CM | POA: Diagnosis not present

## 2016-12-11 DIAGNOSIS — J449 Chronic obstructive pulmonary disease, unspecified: Secondary | ICD-10-CM | POA: Insufficient documentation

## 2016-12-11 DIAGNOSIS — M1990 Unspecified osteoarthritis, unspecified site: Secondary | ICD-10-CM | POA: Diagnosis not present

## 2016-12-11 DIAGNOSIS — I169 Hypertensive crisis, unspecified: Secondary | ICD-10-CM

## 2016-12-11 DIAGNOSIS — G894 Chronic pain syndrome: Secondary | ICD-10-CM | POA: Diagnosis not present

## 2016-12-11 DIAGNOSIS — F329 Major depressive disorder, single episode, unspecified: Secondary | ICD-10-CM | POA: Diagnosis not present

## 2016-12-11 DIAGNOSIS — K219 Gastro-esophageal reflux disease without esophagitis: Secondary | ICD-10-CM | POA: Insufficient documentation

## 2016-12-11 DIAGNOSIS — F17210 Nicotine dependence, cigarettes, uncomplicated: Secondary | ICD-10-CM | POA: Insufficient documentation

## 2016-12-11 DIAGNOSIS — Z90721 Acquired absence of ovaries, unilateral: Secondary | ICD-10-CM | POA: Insufficient documentation

## 2016-12-11 MED ORDER — GABAPENTIN 300 MG PO CAPS: 300.0000 mg | ORAL_CAPSULE | Freq: Every day | ORAL | 1 refills | Status: DC

## 2016-12-11 MED ORDER — DICLOFENAC SODIUM 1 % TD GEL
2.0000 g | Freq: Four times a day (QID) | TRANSDERMAL | 1 refills | Status: DC
Start: 2016-12-11 — End: 2018-06-12
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
## 2016-12-23 ENCOUNTER — Telehealth: Payer: Self-pay

## 2016-12-23 NOTE — Telephone Encounter (Signed)
Patient called, stated did not a prescription for pain medication during last visit, last note stated:   Explained to patient narcotics will be last resort, pt upset stating her thoughts are not good and she was expecting to receive narcotics  attempted to call patient for more information, no answer, left voicemail to call back

## 2016-12-23 NOTE — Telephone Encounter (Signed)
I explained to her narcotics would be a last resort and we would need to try other modalities/medications prior to that.  She was upset with that response on last visit, however, there were several other things that were addressed that I do not see follow up on.  Did she get her labs drawn?  Did she have imaging done?  Did she get electrodes for her TENS? Has she seen her PCP regarding her uncontrolled BP? Some of these things need to be completed before further medications changes.  If she had these things done, she can fax [place] the results, so we may adjust meds accordingly.  In the meantime, she may take her Gabapentin TID if she is not having side effects. Thanks.

## 2016-12-23 NOTE — Telephone Encounter (Signed)
Patient called back, states medication that was prescribed on last visit is not helping with pain at all and is requesting pain medications, please advise

## 2016-12-25 ENCOUNTER — Telehealth: Payer: Self-pay

## 2016-12-25 NOTE — Telephone Encounter (Signed)
ERROR

## 2017-01-08 ENCOUNTER — Encounter: Payer: Medicaid Other | Attending: Physical Medicine & Rehabilitation | Admitting: Physical Medicine & Rehabilitation

## 2017-01-08 DIAGNOSIS — Z9889 Other specified postprocedural states: Secondary | ICD-10-CM | POA: Insufficient documentation

## 2017-01-08 DIAGNOSIS — Z90721 Acquired absence of ovaries, unilateral: Secondary | ICD-10-CM | POA: Insufficient documentation

## 2017-01-08 DIAGNOSIS — F419 Anxiety disorder, unspecified: Secondary | ICD-10-CM | POA: Insufficient documentation

## 2017-01-08 DIAGNOSIS — I10 Essential (primary) hypertension: Secondary | ICD-10-CM | POA: Insufficient documentation

## 2017-01-08 DIAGNOSIS — F329 Major depressive disorder, single episode, unspecified: Secondary | ICD-10-CM | POA: Insufficient documentation

## 2017-01-08 DIAGNOSIS — M545 Low back pain: Secondary | ICD-10-CM | POA: Insufficient documentation

## 2017-01-08 DIAGNOSIS — M791 Myalgia: Secondary | ICD-10-CM | POA: Insufficient documentation

## 2017-01-08 DIAGNOSIS — M1990 Unspecified osteoarthritis, unspecified site: Secondary | ICD-10-CM | POA: Insufficient documentation

## 2017-01-08 DIAGNOSIS — F17210 Nicotine dependence, cigarettes, uncomplicated: Secondary | ICD-10-CM | POA: Insufficient documentation

## 2017-01-08 DIAGNOSIS — G894 Chronic pain syndrome: Secondary | ICD-10-CM | POA: Insufficient documentation

## 2017-01-08 DIAGNOSIS — Z8249 Family history of ischemic heart disease and other diseases of the circulatory system: Secondary | ICD-10-CM | POA: Insufficient documentation

## 2017-01-08 DIAGNOSIS — J449 Chronic obstructive pulmonary disease, unspecified: Secondary | ICD-10-CM | POA: Insufficient documentation

## 2017-01-08 DIAGNOSIS — K219 Gastro-esophageal reflux disease without esophagitis: Secondary | ICD-10-CM | POA: Insufficient documentation

## 2017-03-05 ENCOUNTER — Telehealth: Payer: Self-pay | Admitting: Orthopaedic Surgery

## 2017-03-05 NOTE — Telephone Encounter (Signed)
Called patient to notify. Unable to reach at ph# provided 03/05/17. Called # on file [phone].  Left voice message to return call.

## 2017-03-05 NOTE — Telephone Encounter (Signed)
Patient stopped by office to relay that she needs a note for work, which requires her to lift 50 lbs, repetitively. States Dr Luna Glasgow has seen her for her trigger finger. Seen most recently 04/10/16.  States she has also been to pain management, and that is reason she's not been able to return.  Patient also mentioned disability forms which she asked if she may show to Dr Luna Glasgow.  Please advise.  Ph# [phone]

## 2017-03-05 NOTE — Telephone Encounter (Signed)
I have not seen in a long time.  I would need to see to fill out any forms, etc.

## 2017-03-09 NOTE — Telephone Encounter (Signed)
Spoke with patient. Relayed.

## 2017-04-02 ENCOUNTER — Emergency Department (HOSPITAL_COMMUNITY)
Admission: EM | Admit: 2017-04-02 | Discharge: 2017-04-02 | Disposition: A | Discharge status: Home / Self Care | Payer: Medicaid Other | Attending: Emergency Medicine | Admitting: Emergency Medicine

## 2017-04-02 ENCOUNTER — Encounter (HOSPITAL_COMMUNITY): Payer: Self-pay | Admitting: Emergency Medicine

## 2017-04-02 ENCOUNTER — Emergency Department (HOSPITAL_COMMUNITY): Payer: Medicaid Other

## 2017-04-02 DIAGNOSIS — I10 Essential (primary) hypertension: Secondary | ICD-10-CM | POA: Insufficient documentation

## 2017-04-02 DIAGNOSIS — F17210 Nicotine dependence, cigarettes, uncomplicated: Secondary | ICD-10-CM | POA: Diagnosis not present

## 2017-04-02 DIAGNOSIS — M778 Other enthesopathies, not elsewhere classified: Secondary | ICD-10-CM | POA: Insufficient documentation

## 2017-04-02 DIAGNOSIS — M25532 Pain in left wrist: Secondary | ICD-10-CM | POA: Diagnosis present

## 2017-04-02 DIAGNOSIS — J45909 Unspecified asthma, uncomplicated: Secondary | ICD-10-CM | POA: Insufficient documentation

## 2017-04-02 DIAGNOSIS — X503XXA Overexertion from repetitive movements, initial encounter: Secondary | ICD-10-CM | POA: Insufficient documentation

## 2017-04-02 DIAGNOSIS — Z79899 Other long term (current) drug therapy: Secondary | ICD-10-CM | POA: Diagnosis not present

## 2017-04-02 DIAGNOSIS — J449 Chronic obstructive pulmonary disease, unspecified: Secondary | ICD-10-CM | POA: Diagnosis not present

## 2017-04-02 DIAGNOSIS — Z7982 Long term (current) use of aspirin: Secondary | ICD-10-CM | POA: Diagnosis not present

## 2017-04-02 MED ORDER — CYCLOBENZAPRINE HCL 5 MG PO TABS: 5.0000 mg | ORAL_TABLET | Freq: Two times a day (BID) | ORAL | 0 refills | Status: DC | PRN

## 2017-04-02 NOTE — Discharge Instructions (Signed)
Continue to wear the splint, continue to use the Voltaren Gel, take the muscle relaxant at night. You can take it twice a day but do not drive or operate machinery while taking it because it can make you sleepy.  Follow up with Dr. Luna Glasgow or with Dr. Marcelino Scot, the hand surgeon in [place]. Return here as needed.

## 2017-04-02 NOTE — ED Provider Notes (Signed)
[facility] DEPT Provider Note   CSN: [id number] Arrival date & time: 04/02/17  2505     History   Chief Complaint Chief Complaint  Patient presents with  . Wrist Pain    HPI Judy Boyd is a 55 y.o. female who presents to the ED with wrist pain. Pain started 2 weeks ago. Patient reports working at at job that uses her wrist doing the same job over and over. She has had problems in the past with the same wrist and saw Dr. Luna Glasgow about a year ago. He released her to pain management clinic and they gave her Voltaren Gel. She has done well until 2 weeks ago when the pain returned. Patient is wearing a wrist splint from previous problem with the same wrist.   HPI  Past Medical History:  Diagnosis Date  . Anxiety   . Arthritis   . Asthma   . Bowel obstruction ([place])   . Chronic pain   . COPD (chronic obstructive pulmonary disease) ([place])   . Depression   . GERD (gastroesophageal reflux disease)   . Hypertension     Patient Active Problem List   Diagnosis Date Noted  . GERD (gastroesophageal reflux disease) 04/06/2012  . Abdominal pain 04/06/2012  . Nausea & vomiting 04/06/2012  . Melena 04/06/2012    Past Surgical History:  Procedure Laterality Date  . ABDOMINAL SURGERY  2003   blockage  . left salpingectomy    . uterine ablation      OB History    No data available       Home Medications    Prior to Admission medications   Medication Sig Start Date End Date Taking? Authorizing Provider  albuterol (PROVENTIL HFA;VENTOLIN HFA) 108 (90 BASE) MCG/ACT inhaler Inhale 2 puffs into the lungs every 6 (six) hours as needed. Shortness of breath    [provider]  aspirin EC 81 MG tablet Take 81 mg by mouth daily.    [provider]  budesonide-formoterol (SYMBICORT) 160-4.5 MCG/ACT inhaler Inhale 2 puffs into the lungs 2 (two) times daily.     [provider]  clorazepate (TRANXENE) 7.5 MG tablet Take 7.5 mg by mouth 2 (two) times  daily as needed for anxiety.    [provider]  cyclobenzaprine (FLEXERIL) 5 MG tablet Take 1 tablet (5 mg total) by mouth 2 (two) times daily as needed for muscle spasms. 04/02/17   Ashley Murrain, NP  diclofenac sodium (VOLTAREN) 1 % GEL Apply 2 g topically 4 (four) times daily. 12/11/16   Jamse Arn, MD  gabapentin (NEURONTIN) 300 MG capsule Take 1 capsule (300 mg total) by mouth at bedtime. 12/11/16 01/10/17  Jamse Arn, MD  hydrochlorothiazide (HYDRODIURIL) 12.5 MG tablet TK 1 T PO BID 11/29/15   [provider]  montelukast (SINGULAIR) 10 MG tablet Take 10 mg by mouth at bedtime.      [provider]  nabumetone (RELAFEN) 500 MG tablet Take 1 tablet (500 mg total) by mouth daily as needed for mild pain. 11/07/15   Sanjuana Kava, MD  predniSONE (DELTASONE) 10 MG tablet 6,5,4,3,2,1 taper 03/06/16   Sanjuana Kava, MD  verapamil (CALAN) 120 MG tablet Take 1 tablet by mouth 3 (three) times daily. 05/28/15   [provider]    Family History Family History  Problem Relation Age of Onset  . Heart disease Mother   . Heart disease Father     Social History Social History  Substance Use Topics  .  Smoking status: Current Every Day Smoker    Packs/day: 1.00    Years: 10.00    Types: Cigarettes  . Smokeless tobacco: Never Used  . Alcohol use Yes     Comment: 2 beers per week     Allergies   Amlodipine   Review of Systems Review of Systems  Musculoskeletal: Positive for arthralgias and myalgias.       Left wrist pain  All other systems reviewed and are negative.    Physical Exam Updated Vital Signs BP (!) 189/107 (BP Location: Right Arm) Comment: pt reports took bp medication PTA. nad noted.  Pulse 82   Temp 98.1 F (36.7 C) (Oral)   Resp 18   Ht 5\' 6"  (1.676 m)   Wt 56.7 kg (125 lb)   SpO2 100%   BMI 20.18 kg/m   Physical Exam  Constitutional: She appears well-developed and well-nourished. No distress.  HENT:  Head:  Normocephalic and atraumatic.  Eyes: EOM are normal.  Neck: Neck supple.  Cardiovascular: Normal rate.   Pulmonary/Chest: Effort normal.  Musculoskeletal:       Left wrist: She exhibits tenderness. She exhibits normal range of motion, no crepitus, no deformity and no laceration.       Left hand: Normal sensation noted. Normal strength noted. She exhibits no thumb/finger opposition.  Radial pulse 2+, positive Tinel's sing  Neurological: She is alert. She has normal strength. No sensory deficit.  Skin: Skin is warm and dry.  Psychiatric: She has a normal mood and affect. Her behavior is normal.  Nursing note and vitals reviewed.    ED Treatments / Results  Labs (all labs ordered are listed, but only abnormal results are displayed) Labs Reviewed - No data to display  Radiology Dg Wrist Complete Left  Result Date: 04/02/2017 CLINICAL DATA:  Recurrent wrist pain with the current episode lasting for the past 2 weeks. No new or recent injury. Currently undergoing pain management. EXAM: LEFT WRIST - COMPLETE 3+ VIEW COMPARISON:  None in PACs FINDINGS: The bones of the left wrist are subjectively adequately mineralized. No acute or healing fracture is observed. There is no lytic nor blastic lesion. The joint spaces are well maintained. No significant arthritic changes are observed. IMPRESSION: There is no acute or significant chronic bony abnormality of the left wrist. If further imaging is felt indicated clinically, MRI would be a useful modality. Electronically Signed   By: David  Martinique M.D.   On: 04/02/2017 09:21    Procedures Procedures (including critical care time)  Medications Ordered in ED Medications - No data to display   Initial Impression / Assessment and Plan / ED Course  I have reviewed the triage vital signs and the nursing notes.  Pertinent imaging results that were available during my care of the patient were reviewed by me and considered in my medical decision making  (see chart for details).   Final Clinical Impressions(s) / ED Diagnoses  56 y.o. female with left wrist pain stable for d/c without focal neuro deficits and no acute fracture or dislocation noted on x-ray. Discussed plan of care and f/u with the patient. She will continue her Voltaren Gel and wearing the wrist splint and I will add low dose Flexeril. She will f/u with ortho. Return precautions discussed.  Final diagnoses:  Left wrist tendonitis    New Prescriptions New Prescriptions   CYCLOBENZAPRINE (FLEXERIL) 5 MG TABLET    Take 1 tablet (5 mg total) by mouth 2 (two) times daily as  needed for muscle spasms.     Debroah Baller [place], [place] 04/02/17 [id number]    Davonna Belling, MD 04/02/17 [phone]

## 2017-04-02 NOTE — ED Triage Notes (Signed)
Pt reports continued left wrist pain. Pt reports has seen ortho in the past and is currently in pain management. Pt denies any new or recent injury. Pt reports latest episode of pain x2 weeks. nad noted.

## 2017-04-07 ENCOUNTER — Ambulatory Visit (INDEPENDENT_AMBULATORY_CARE_PROVIDER_SITE_OTHER): Payer: Medicaid Other | Admitting: Orthopaedic Surgery

## 2017-04-07 ENCOUNTER — Encounter: Payer: Self-pay | Admitting: Orthopaedic Surgery

## 2017-04-07 VITALS — BP 162/110 | HR 113 | Temp 98.1°F | Ht 68.0 in | Wt 138.0 lb

## 2017-04-07 DIAGNOSIS — F17210 Nicotine dependence, cigarettes, uncomplicated: Secondary | ICD-10-CM | POA: Diagnosis not present

## 2017-04-07 DIAGNOSIS — M778 Other enthesopathies, not elsewhere classified: Secondary | ICD-10-CM | POA: Diagnosis not present

## 2017-04-07 MED ORDER — NABUMETONE 500 MG PO TABS: 500.0000 mg | ORAL_TABLET | Freq: Every day | ORAL | 5 refills | Status: DC | PRN

## 2017-04-07 NOTE — Progress Notes (Signed)
Judy Boyd, female DOB:Feb 03, 1961, 56 y.o. [id number]  Chief Complaint  Judy presents with  . Wrist Pain    left    HPI  Judy Boyd is a 56 y.o. female who has left wrist pain.  She went to the ER on 04-02-17.  X-rays were done and were negative.  She was felt to have tendinitis.  She does repetitive work.  She has no direct trauma, no redness.  She hurts more ulnarward on the left wrist.  She has no swelling. HPI  Body mass index is 20.98 kg/m.  ROS  Review of Systems  Constitutional:       Judy does not have Diabetes Mellitus. Judy does not have hypertension. Judy has COPD or shortness of breath. Judy does not have BMI > 35. Judy has current smoking history.  HENT: Positive for congestion.   Respiratory: Positive for cough and shortness of breath.   Endocrine: Positive for cold intolerance.  Musculoskeletal: Positive for back pain.  Allergic/Immunologic: Positive for environmental allergies.  Psychiatric/Behavioral: The Judy is nervous/anxious.   All other systems reviewed and are negative.   Past Medical History:  Diagnosis Date  . Anxiety   . Arthritis   . Asthma   . Bowel obstruction ([place])   . Chronic pain   . COPD (chronic obstructive pulmonary disease) ([place])   . Depression   . GERD (gastroesophageal reflux disease)   . Hypertension     Past Surgical History:  Procedure Laterality Date  . ABDOMINAL SURGERY  2003   blockage  . left salpingectomy    . uterine ablation      Family History  Problem Relation Age of Onset  . Heart disease Mother   . Heart disease Father     Social History Social History  Substance Use Topics  . Smoking status: Current Every Day Smoker    Packs/day: 1.00    Years: 10.00    Types: Cigarettes  . Smokeless tobacco: Never Used  . Alcohol use Yes     Comment: 2 beers per week    Allergies  Allergen Reactions  . Amlodipine Other (See Comments)    Dehydration      Current Outpatient Prescriptions  Medication Sig Dispense Refill  . albuterol (PROVENTIL HFA;VENTOLIN HFA) 108 (90 BASE) MCG/ACT inhaler Inhale 2 puffs into the lungs every 6 (six) hours as needed. Shortness of breath    . aspirin EC 81 MG tablet Take 81 mg by mouth daily.    . budesonide-formoterol (SYMBICORT) 160-4.5 MCG/ACT inhaler Inhale 2 puffs into the lungs 2 (two) times daily.     . clorazepate (TRANXENE) 7.5 MG tablet Take 7.5 mg by mouth 2 (two) times daily as needed for anxiety.    . cyclobenzaprine (FLEXERIL) 5 MG tablet Take 1 tablet (5 mg total) by mouth 2 (two) times daily as needed for muscle spasms. 15 tablet 0  . diclofenac sodium (VOLTAREN) 1 % GEL Apply 2 g topically 4 (four) times daily. 1 Tube 1  . gabapentin (NEURONTIN) 300 MG capsule Take 1 capsule (300 mg total) by mouth at bedtime. 30 capsule 1  . hydrochlorothiazide (HYDRODIURIL) 12.5 MG tablet TK 1 T PO BID  3  . montelukast (SINGULAIR) 10 MG tablet Take 10 mg by mouth at bedtime.      . nabumetone (RELAFEN) 500 MG tablet Take 1 tablet (500 mg total) by mouth daily as needed for mild pain. 60 tablet 5  . predniSONE (DELTASONE) 10 MG tablet 6,5,4,3,2,1  taper 21 tablet 0  . verapamil (CALAN) 120 MG tablet Take 1 tablet by mouth 3 (three) times daily.  0   No current facility-administered medications for this visit.      Physical Exam  Blood pressure (!) 162/110, pulse (!) 113, temperature 98.1 F (36.7 C), height 5\' 8"  (1.727 m), weight 138 lb (62.6 kg).  Constitutional: overall normal hygiene, normal nutrition, well developed, normal grooming, normal body habitus. Assistive device:none  Musculoskeletal: gait and station Limp none, muscle tone and strength are normal, no tremors or atrophy is present.  .  Neurological: coordination overall normal.  Deep tendon reflex/nerve stretch intact.  Sensation normal.  Cranial nerves II-XII intact.   Skin:   Normal overall no scars, lesions, ulcers or rashes. No  psoriasis.  Psychiatric: Alert and oriented x 3.  Recent memory intact, remote memory unclear.  Normal mood and affect. Well groomed.  Good eye contact.  Cardiovascular: overall no swelling, no varicosities, no edema bilaterally, normal temperatures of the legs and arms, no clubbing, cyanosis and good capillary refill.  Lymphatic: palpation is normal.  Left wrist has tenderness of the sixth compartment of the extensor carpi ulnaris.  She has no redness or swelling.  NV intact. ROM of the wrist is full but she is very tender here.  The Judy has been educated about the nature of the problem(s) and counseled on treatment options.  The Judy appeared to understand what I have discussed and is in agreement with it.  Encounter Diagnoses  Name Primary?  . Tendinitis of left wrist Yes  . Cigarette nicotine dependence without complication    Procedure note: After permission from the Judy, the left ulnar wrist was prepped.  I injected the sixth compartment by sterile technique with 1% Xylocaine and 1 cc of DepoMedrol tolerated well.  PLAN Call if any problems.  Precautions discussed.  Continue current medications.   Return to clinic 2 weeks  She smokes and is cutting back.  She hopes to be able to stop in a few months.  I have refilled her Relafen.  Note given for out of work last night and tonight.  Electronically Signed Sanjuana Kava, MD 8/21/20189:40 AM

## 2017-04-07 NOTE — Patient Instructions (Signed)
Note out of work last night and tonight.

## 2017-04-09 ENCOUNTER — Telehealth: Payer: Self-pay | Admitting: Orthopaedic Surgery

## 2017-04-09 MED ORDER — NAPROXEN 500 MG PO TABS: 500.0000 mg | ORAL_TABLET | Freq: Two times a day (BID) | ORAL | 5 refills | Status: DC

## 2017-04-09 NOTE — Telephone Encounter (Signed)
Per the pharmacist, her insurance(Medicaid) will not pay for Relafen.  Will you prescribe something else?    She uses [facility]

## 2017-04-21 ENCOUNTER — Ambulatory Visit (INDEPENDENT_AMBULATORY_CARE_PROVIDER_SITE_OTHER): Payer: Medicaid Other | Admitting: Orthopaedic Surgery

## 2017-04-21 VITALS — BP 201/123 | HR 105 | Temp 99.0°F | Ht 68.0 in | Wt 140.0 lb

## 2017-04-21 DIAGNOSIS — M778 Other enthesopathies, not elsewhere classified: Secondary | ICD-10-CM

## 2017-04-21 DIAGNOSIS — G894 Chronic pain syndrome: Secondary | ICD-10-CM

## 2017-04-21 NOTE — Progress Notes (Signed)
Patient Judy Boyd, female DOB:[birth year], 56 y.o. [id number]  Chief Complaint  Patient presents with  . Follow-up    Left wrist    HPI  Judy Boyd is a 56 y.o. female who has had left wrist pain and tendinitis. She is much improved with no pain now.  She has no new trauma.  She asks for narcotic for possible pain in the future and I said no.  She should take Tylenol or Aleve. HPI  Body mass index is 21.29 kg/m.  ROS  Review of Systems  Constitutional:       Patient does not have Diabetes Mellitus. Patient does not have hypertension. Patient has COPD or shortness of breath. Patient does not have BMI > 35. Patient has current smoking history.  HENT: Positive for congestion.   Respiratory: Positive for cough and shortness of breath.   Endocrine: Positive for cold intolerance.  Musculoskeletal: Positive for back pain.  Allergic/Immunologic: Positive for environmental allergies.  Psychiatric/Behavioral: The patient is nervous/anxious.   All other systems reviewed and are negative.   Past Medical History:  Diagnosis Date  . Anxiety   . Arthritis   . Asthma   . Bowel obstruction ([place])   . Chronic pain   . COPD (chronic obstructive pulmonary disease) ([place])   . Depression   . GERD (gastroesophageal reflux disease)   . Hypertension     Past Surgical History:  Procedure Laterality Date  . ABDOMINAL SURGERY  2003   blockage  . left salpingectomy    . uterine ablation      Family History  Problem Relation Age of Onset  . Heart disease Mother   . Heart disease Father     Social History Social History  Substance Use Topics  . Smoking status: Current Every Day Smoker    Packs/day: 1.00    Years: 10.00    Types: Cigarettes  . Smokeless tobacco: Never Used  . Alcohol use Yes     Comment: 2 beers per week    Allergies  Allergen Reactions  . Amlodipine Other (See Comments)    Dehydration     Current Outpatient Prescriptions  Medication  Sig Dispense Refill  . albuterol (PROVENTIL HFA;VENTOLIN HFA) 108 (90 BASE) MCG/ACT inhaler Inhale 2 puffs into the lungs every 6 (six) hours as needed. Shortness of breath    . aspirin EC 81 MG tablet Take 81 mg by mouth daily.    . budesonide-formoterol (SYMBICORT) 160-4.5 MCG/ACT inhaler Inhale 2 puffs into the lungs 2 (two) times daily.     . clorazepate (TRANXENE) 7.5 MG tablet Take 7.5 mg by mouth 2 (two) times daily as needed for anxiety.    . cyclobenzaprine (FLEXERIL) 5 MG tablet Take 1 tablet (5 mg total) by mouth 2 (two) times daily as needed for muscle spasms. 15 tablet 0  . diclofenac sodium (VOLTAREN) 1 % GEL Apply 2 g topically 4 (four) times daily. 1 Tube 1  . gabapentin (NEURONTIN) 300 MG capsule Take 1 capsule (300 mg total) by mouth at bedtime. 30 capsule 1  . hydrochlorothiazide (HYDRODIURIL) 12.5 MG tablet TK 1 T PO BID  3  . montelukast (SINGULAIR) 10 MG tablet Take 10 mg by mouth at bedtime.      . naproxen (NAPROSYN) 500 MG tablet Take 1 tablet (500 mg total) by mouth 2 (two) times daily with a meal. 60 tablet 5  . predniSONE (DELTASONE) 10 MG tablet 6,5,4,3,2,1 taper 21 tablet 0  . verapamil (CALAN) 120  MG tablet Take 1 tablet by mouth 3 (three) times daily.  0   No current facility-administered medications for this visit.      Physical Exam  Blood pressure (!) 201/123, pulse (!) 105, temperature 99 F (37.2 C), height 5\' 8"  (1.727 m), weight 140 lb (63.5 kg).  Constitutional: overall normal hygiene, normal nutrition, well developed, normal grooming, normal body habitus. Assistive device:none  Musculoskeletal: gait and station Limp none, muscle tone and strength are normal, no tremors or atrophy is present.  .  Neurological: coordination overall normal.  Deep tendon reflex/nerve stretch intact.  Sensation normal.  Cranial nerves II-XII intact.   Skin:   Normal overall no scars, lesions, ulcers or rashes. No psoriasis.  Psychiatric: Alert and oriented x 3.   Recent memory intact, remote memory unclear.  Normal mood and affect. Well groomed.  Good eye contact.  Cardiovascular: overall no swelling, no varicosities, no edema bilaterally, normal temperatures of the legs and arms, no clubbing, cyanosis and good capillary refill.  Lymphatic: palpation is normal.  She has full motion of the left thumb and hand with no pain, no swelling, NV intact.  The patient has been educated about the nature of the problem(s) and counseled on treatment options.  The patient appeared to understand what I have discussed and is in agreement with it.  Encounter Diagnoses  Name Primary?  . Tendinitis of left wrist Yes  . Chronic pain syndrome     PLAN Call if any problems.  Precautions discussed.  Continue current medications.   Return to clinic PRN   Electronically Signed Sanjuana Kava, MD 9/4/20189:42 AM

## 2017-04-21 NOTE — Patient Instructions (Signed)
Steps to Quit Smoking Smoking tobacco can be bad for your health. It can also affect almost every organ in your body. Smoking puts you and people around you at risk for many serious [place]-lasting (chronic) diseases. Quitting smoking is hard, but it is one of the best things that you can do for your health. It is never too late to quit. What are the benefits of quitting smoking? When you quit smoking, you lower your risk for getting serious diseases and conditions. They can include:  Lung cancer or lung disease.  Heart disease.  Stroke.  Heart attack.  Not being able to have children (infertility).  Weak bones (osteoporosis) and broken bones (fractures).  If you have coughing, wheezing, and shortness of breath, those symptoms may get better when you quit. You may also get sick less often. If you are pregnant, quitting smoking can help to lower your chances of having a baby of low birth weight. What can I do to help me quit smoking? Talk with your doctor about what can help you quit smoking. Some things you can do (strategies) include:  Quitting smoking totally, instead of slowly cutting back how much you smoke over a period of time.  Going to in-person counseling. You are more likely to quit if you go to many counseling sessions.  Using resources and support systems, such as: ? Online chats with a counselor. ? Phone quitlines. ? Printed self-help materials. ? Support groups or group counseling. ? Text messaging programs. ? Mobile phone apps or applications.  Taking medicines. Some of these medicines may have nicotine in them. If you are pregnant or breastfeeding, do not take any medicines to quit smoking unless your doctor says it is okay. Talk with your doctor about counseling or other things that can help you.  Talk with your doctor about using more than one strategy at the same time, such as taking medicines while you are also going to in-person counseling. This can help make  quitting easier. What things can I do to make it easier to quit? Quitting smoking might feel very hard at first, but there is a lot that you can do to make it easier. Take these steps:  Talk to your family and friends. Ask them to support and encourage you.  Call phone quitlines, reach out to support groups, or work with a counselor.  Ask people who smoke to not smoke around you.  Avoid places that make you want (trigger) to smoke, such as: ? Bars. ? Parties. ? Smoke-break areas at work.  Spend time with people who do not smoke.  Lower the stress in your life. Stress can make you want to smoke. Try these things to help your stress: ? Getting regular exercise. ? Deep-breathing exercises. ? Yoga. ? Meditating. ? Doing a body scan. To do this, close your eyes, focus on one area of your body at a time from head to toe, and notice which parts of your body are tense. Try to relax the muscles in those areas.  Download or buy apps on your mobile phone or tablet that can help you stick to your quit plan. There are many free apps, such as QuitGuide from the CDC (Centers for Disease Control and Prevention). You can find more support from smokefree.gov and other websites.  This information is not intended to replace advice given to you by your health care provider. Make sure you discuss any questions you have with your health care provider. Document Released: 05/31/2009 Document   Revised: 04/01/2016 Document Reviewed: 12/19/2014 Elsevier Interactive Patient Education  2018 Elsevier Inc.  

## 2017-06-17 ENCOUNTER — Ambulatory Visit (INDEPENDENT_AMBULATORY_CARE_PROVIDER_SITE_OTHER): Payer: Medicaid Other | Admitting: Orthopaedic Surgery

## 2017-06-17 ENCOUNTER — Encounter: Payer: Self-pay | Admitting: Orthopaedic Surgery

## 2017-06-17 VITALS — BP 190/105 | HR 91 | Temp 98.3°F | Ht 68.0 in | Wt 141.0 lb

## 2017-06-17 DIAGNOSIS — F17210 Nicotine dependence, cigarettes, uncomplicated: Secondary | ICD-10-CM | POA: Diagnosis not present

## 2017-06-17 DIAGNOSIS — M778 Other enthesopathies, not elsewhere classified: Secondary | ICD-10-CM | POA: Diagnosis not present

## 2017-06-17 MED ORDER — DICLOFENAC SODIUM 75 MG PO TBEC: 75.0000 mg | DELAYED_RELEASE_TABLET | Freq: Two times a day (BID) | ORAL | 2 refills | Status: DC

## 2017-06-17 NOTE — Patient Instructions (Addendum)
Steps to Quit Smoking Smoking tobacco can be bad for your health. It can also affect almost every organ in your body. Smoking puts you and people around you at risk for many serious long-lasting (chronic) diseases. Quitting smoking is hard, but it is one of the best things that you can do for your health. It is never too late to quit. What are the benefits of quitting smoking? When you quit smoking, you lower your risk for getting serious diseases and conditions. They can include:  Lung cancer or lung disease.  Heart disease.  Stroke.  Heart attack.  Not being able to have children (infertility).  Weak bones (osteoporosis) and broken bones (fractures).  If you have coughing, wheezing, and shortness of breath, those symptoms may get better when you quit. You may also get sick less often. If you are pregnant, quitting smoking can help to lower your chances of having a baby of low birth weight. What can I do to help me quit smoking? Talk with your doctor about what can help you quit smoking. Some things you can do (strategies) include:  Quitting smoking totally, instead of slowly cutting back how much you smoke over a period of time.  Going to in-person counseling. You are more likely to quit if you go to many counseling sessions.  Using resources and support systems, such as: ? Online chats with a counselor. ? Phone quitlines. ? Printed self-help materials. ? Support groups or group counseling. ? Text messaging programs. ? Mobile phone apps or applications.  Taking medicines. Some of these medicines may have nicotine in them. If you are pregnant or breastfeeding, do not take any medicines to quit smoking unless your doctor says it is okay. Talk with your doctor about counseling or other things that can help you.  Talk with your doctor about using more than one strategy at the same time, such as taking medicines while you are also going to in-person counseling. This can help make  quitting easier. What things can I do to make it easier to quit? Quitting smoking might feel very hard at first, but there is a lot that you can do to make it easier. Take these steps:  Talk to your family and friends. Ask them to support and encourage you.  Call phone quitlines, reach out to support groups, or work with a counselor.  Ask people who smoke to not smoke around you.  Avoid places that make you want (trigger) to smoke, such as: ? Bars. ? Parties. ? Smoke-break areas at work.  Spend time with people who do not smoke.  Lower the stress in your life. Stress can make you want to smoke. Try these things to help your stress: ? Getting regular exercise. ? Deep-breathing exercises. ? Yoga. ? Meditating. ? Doing a body scan. To do this, close your eyes, focus on one area of your body at a time from head to toe, and notice which parts of your body are tense. Try to relax the muscles in those areas.  Download or buy apps on your mobile phone or tablet that can help you stick to your quit plan. There are many free apps, such as QuitGuide from the CDC (Centers for Disease Control and Prevention). You can find more support from smokefree.gov and other websites.  This information is not intended to replace advice given to you by your health care provider. Make sure you discuss any questions you have with your health care provider. Document Released: 05/31/2009 Document   Revised: 04/01/2016 Document Reviewed: 12/19/2014 Elsevier Interactive Patient Education  [employer].   No lifting greater than 5 pounds at work. No stacking at work.

## 2017-06-17 NOTE — Progress Notes (Signed)
Patient Judy Boyd, female DOB:[birth year], 55 y.o. Judy Boyd  Chief Complaint  Patient presents with  . Hand Pain    bilateral     HPI  Judy Boyd is a 56 y.o. 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  She has no new trauma.  She says the Naprosyn does not help any more.  She uses Voltaren Gel and that helps.  I will change to diclofenac oral tablets.  I will give note to limit weight lifting at work to five pounds and no stacking. HPI  Body mass index is 21.44 kg/m.  ROS  Review of Systems  Constitutional:       Patient does not have Diabetes Mellitus. Patient does not have hypertension. Patient has COPD or shortness of breath. Patient does not have BMI > 35. Patient has current smoking history.  HENT: Positive for congestion.   Respiratory: Positive for cough and shortness of breath.   Endocrine: Positive for cold intolerance.  Musculoskeletal: Positive for back pain.  Allergic/Immunologic: Positive for environmental allergies.  Psychiatric/Behavioral: The patient is nervous/anxious.   All other systems reviewed and are negative.   Past Medical History:  Diagnosis Date  . Anxiety   . Arthritis   . Asthma   . Bowel obstruction ([place])   . Chronic pain   . COPD (chronic obstructive pulmonary disease) ([place])   . Depression   . GERD (gastroesophageal reflux disease)   . Hypertension     Past Surgical History:  Procedure Laterality Date  . ABDOMINAL SURGERY  2003   blockage  . left salpingectomy    . uterine ablation      Family History  Problem Relation Age of Onset  . Heart disease Mother   . Heart disease Father     Social History Social History  Substance Use Topics  . Smoking status: Current Every Day Smoker    Packs/day: 1.00    Years: 10.00    Types: Cigarettes  . Smokeless tobacco: Never Used  . Alcohol use Yes     Comment: 2 beers per week     Allergies  Allergen Reactions  . Amlodipine Other (See Comments)    Dehydration     Current Outpatient Prescriptions  Medication Sig Dispense Refill  . albuterol (PROVENTIL HFA;VENTOLIN HFA) 108 (90 BASE) MCG/ACT inhaler Inhale 2 puffs into the lungs every 6 (six) hours as needed. Shortness of breath    . aspirin EC 81 MG tablet Take 81 mg by mouth daily.    . budesonide-formoterol (SYMBICORT) 160-4.5 MCG/ACT inhaler Inhale 2 puffs into the lungs 2 (two) times daily.     . clorazepate (TRANXENE) 7.5 MG tablet Take 7.5 mg by mouth 2 (two) times daily as needed for anxiety.    . cyclobenzaprine (FLEXERIL) 5 MG tablet Take 1 tablet (5 mg total) by mouth 2 (two) times daily as needed for muscle spasms. 15 tablet 0  . diclofenac sodium (VOLTAREN) 1 % GEL Apply 2 g topically 4 (four) times daily. 1 Tube 1  . hydrochlorothiazide (HYDRODIURIL) 12.5 MG tablet TK 1 T PO BID  3  . montelukast (SINGULAIR) 10 MG tablet Take 10 mg by mouth at bedtime.      . naproxen (NAPROSYN) 500 MG tablet Take 1 tablet (500 mg total) by mouth 2 (two) times daily with a meal. 60 tablet 5  . verapamil (CALAN) 120 MG tablet Take 1 tablet by  mouth 3 (three) times daily.  0  . gabapentin (NEURONTIN) 300 MG capsule Take 1 capsule (300 mg total) by mouth at bedtime. 30 capsule 1  . predniSONE (DELTASONE) 10 MG tablet 6,5,4,3,2,1 taper (Patient not taking: Reported on 06/17/2017) 21 tablet 0   No current facility-administered medications for this visit.      Physical Exam  Blood pressure (!) 190/105, pulse 91, temperature 98.3 F (36.8 C), height 5\' 8"  (1.727 m), weight 141 lb (64 kg).  Constitutional: overall normal hygiene, normal nutrition, well developed, normal grooming, normal body habitus. Assistive device:none  Musculoskeletal: gait and station Limp none, muscle tone and strength are normal, no tremors or atrophy is present.  .  Neurological: coordination overall normal.  Deep tendon reflex/nerve  stretch intact.  Sensation normal.  Cranial nerves II-XII intact.   Skin:   Normal overall no scars, lesions, ulcers or rashes. No psoriasis.  Psychiatric: Alert and oriented x 3.  Recent memory intact, remote memory unclear.  Normal mood and affect. Well groomed.  Good eye contact.  Cardiovascular: overall no swelling, no varicosities, no edema bilaterally, normal temperatures of the legs and arms, no clubbing, cyanosis and good capillary refill.  Lymphatic: palpation is normal.  All other systems reviewed and are negative   She has slight tenderness of the left wrist but full ROM.  NV intact. She has no swelling or redness today.  NV intact. Grips are normal.  The patient has been educated about the nature of the problem(s) and counseled on treatment options.  The patient appeared to understand what I have discussed and is in agreement with it.  Encounter Diagnoses  Name Primary?  . Tendinitis of left wrist Yes  . Cigarette nicotine dependence without complication     PLAN Call if any problems.  Precautions discussed.  Continue current medications.   Return to clinic 2 months   Electronically Signed Sanjuana Kava, MD 10/31/20188:47 AM

## 2017-06-18 ENCOUNTER — Telehealth: Payer: Self-pay | Admitting: Orthopaedic Surgery

## 2017-06-18 MED ORDER — MELOXICAM 15 MG PO TABS: 15.0000 mg | ORAL_TABLET | Freq: Every day | ORAL | 5 refills | Status: DC

## 2017-06-18 NOTE — Telephone Encounter (Signed)
Due to pt's insurance requirements, pt needs either Naproxen or Meloxicam sent to [facility]

## 2017-08-20 ENCOUNTER — Ambulatory Visit: Payer: Medicaid Other | Admitting: Orthopaedic Surgery

## 2017-08-20 ENCOUNTER — Telehealth: Payer: Self-pay | Admitting: Orthopaedic Surgery

## 2017-08-20 NOTE — Telephone Encounter (Signed)
Called back to patient, left voice message to offer appointment, per Dr Brooke Bonito note.

## 2017-08-20 NOTE — Telephone Encounter (Signed)
Patient called, requests to cancel her appointment for today, 08/20/17; said still the same, but does not feel up to coming today.  She is asking for an updated work note.  Previous note entered at last visit 06/17/17 indicates: "06/17/17, with restrictions:   - No lifting greater than 5 lbs   - No stacking Continue this work status until further notice"  Please advise.

## 2017-08-20 NOTE — Telephone Encounter (Signed)
Need to see to evaluate whether work restrictions still apply.  Can see in next week or two.

## 2017-08-27 ENCOUNTER — Encounter: Payer: Self-pay | Admitting: Orthopaedic Surgery

## 2017-08-27 ENCOUNTER — Ambulatory Visit: Payer: Medicaid Other | Admitting: Orthopaedic Surgery

## 2017-08-27 ENCOUNTER — Telehealth: Payer: Self-pay | Admitting: Orthopaedic Surgery

## 2017-08-27 VITALS — BP 181/116 | HR 85 | Ht 68.0 in | Wt 136.0 lb

## 2017-08-27 DIAGNOSIS — F17210 Nicotine dependence, cigarettes, uncomplicated: Secondary | ICD-10-CM

## 2017-08-27 DIAGNOSIS — M778 Other enthesopathies, not elsewhere classified: Secondary | ICD-10-CM

## 2017-08-27 NOTE — Patient Instructions (Addendum)
Steps to Quit Smoking Smoking tobacco can be bad for your health. It can also affect almost every organ in your body. Smoking puts you and people around you at risk for many serious long-lasting (chronic) diseases. Quitting smoking is hard, but it is one of the best things that you can do for your health. It is never too late to quit. What are the benefits of quitting smoking? When you quit smoking, you lower your risk for getting serious diseases and conditions. They can include:  Lung cancer or lung disease.  Heart disease.  Stroke.  Heart attack.  Not being able to have children (infertility).  Weak bones (osteoporosis) and broken bones (fractures).  If you have coughing, wheezing, and shortness of breath, those symptoms may get better when you quit. You may also get sick less often. If you are pregnant, quitting smoking can help to lower your chances of having a baby of low birth weight. What can I do to help me quit smoking? Talk with your doctor about what can help you quit smoking. Some things you can do (strategies) include:  Quitting smoking totally, instead of slowly cutting back how much you smoke over a period of time.  Going to in-person counseling. You are more likely to quit if you go to many counseling sessions.  Using resources and support systems, such as: ? Online chats with a counselor. ? Phone quitlines. ? Printed self-help materials. ? Support groups or group counseling. ? Text messaging programs. ? Mobile phone apps or applications.  Taking medicines. Some of these medicines may have nicotine in them. If you are pregnant or breastfeeding, do not take any medicines to quit smoking unless your doctor says it is okay. Talk with your doctor about counseling or other things that can help you.  Talk with your doctor about using more than one strategy at the same time, such as taking medicines while you are also going to in-person counseling. This can help make  quitting easier. What things can I do to make it easier to quit? Quitting smoking might feel very hard at first, but there is a lot that you can do to make it easier. Take these steps:  Talk to your family and friends. Ask them to support and encourage you.  Call phone quitlines, reach out to support groups, or work with a counselor.  Ask people who smoke to not smoke around you.  Avoid places that make you want (trigger) to smoke, such as: ? Bars. ? Parties. ? Smoke-break areas at work.  Spend time with people who do not smoke.  Lower the stress in your life. Stress can make you want to smoke. Try these things to help your stress: ? Getting regular exercise. ? Deep-breathing exercises. ? Yoga. ? Meditating. ? Doing a body scan. To do this, close your eyes, focus on one area of your body at a time from head to toe, and notice which parts of your body are tense. Try to relax the muscles in those areas.  Download or buy apps on your mobile phone or tablet that can help you stick to your quit plan. There are many free apps, such as QuitGuide from the CDC (Centers for Disease Control and Prevention). You can find more support from smokefree.gov and other websites.  This information is not intended to replace advice given to you by your health care provider. Make sure you discuss any questions you have with your health care provider. Document Released: 05/31/2009 Document   Revised: 04/01/2016 Document Reviewed: 12/19/2014 Elsevier Interactive Patient Education  2018 [employer].  Work note:  Limit lifting to 5 pounds and no stacking.

## 2017-08-27 NOTE — Progress Notes (Signed)
CC:  My left wrist is better.  She has less pain in her left wrist.  She is using her splint at work.  She needs new note for work.  She has no numbness.  The left wrist has slight ulnar swelling, NV intact, full ROM.  Grips normal.  BP (!) 181/116   Pulse 85   Ht 5\' 8"  (1.727 m)   Wt 136 lb (61.7 kg)   BMI 20.68 kg/m   Encounter Diagnoses  Name Primary?  . Tendinitis of left wrist Yes  . Cigarette nicotine dependence without complication    Continue no lifting more than five pounds and no stacking.  Continue splint.  Return in six weeks.  Call if any problem.  Precautions discussed.   Electronically Signed Sanjuana Kava, MD 1/10/20198:52 AM

## 2017-10-08 ENCOUNTER — Ambulatory Visit: Payer: Medicaid Other | Admitting: Orthopaedic Surgery

## 2017-10-08 ENCOUNTER — Encounter: Payer: Self-pay | Admitting: Orthopaedic Surgery

## 2017-12-03 ENCOUNTER — Encounter: Payer: Self-pay | Admitting: Orthopedic Surgery

## 2017-12-03 ENCOUNTER — Ambulatory Visit: Payer: Self-pay | Admitting: Orthopedic Surgery

## 2017-12-03 NOTE — Progress Notes (Deleted)
Progress Note   Patient ID: Judy Boyd, female   DOB: July 25, 1961, 57 y.o.   MRN: [id number]  No chief complaint on file.    Medical decision-making No diagnosis found.    No orders of the defined types were placed in this encounter.    PLAN: ***    No chief complaint on file.   She has less pain in her left wrist.  She is using her splint at work.  She needs new note for work.  She has no numbness.   The left wrist has slight ulnar swelling, NV intact, full ROM.  Grips normal.   BP (!) 181/116   Pulse 85   Ht 5\' 8"  (1.727 m)   Wt 136 lb (61.7 kg)   BMI 20.68 kg/m    Encounter Diagnoses Name Primary? . Tendinitis of left wrist Yes . Cigarette nicotine dependence without complication     Continue no lifting more than five pounds and no stacking.   Continue splint.   Return in six weeks.   Call if any problem.   Precautions discussed.     Electronically Signed Sanjuana Kava, MD 1/10/20198:52 AM    ROS No outpatient medications have been marked as taking for the 12/03/17 encounter (Appointment) with Carole Civil, MD.    Allergies  Allergen Reactions  . Amlodipine Other (See Comments)    Dehydration      There were no vitals taken for this visit.  Physical Exam     Arther Abbott, MD 12/03/2017 8:17 AM

## 2018-03-02 ENCOUNTER — Encounter: Payer: Self-pay | Admitting: Orthopaedic Surgery

## 2018-03-02 ENCOUNTER — Ambulatory Visit: Payer: Self-pay | Admitting: Orthopaedic Surgery

## 2018-03-02 VITALS — BP 192/96 | HR 84 | Ht 68.0 in | Wt 127.0 lb

## 2018-03-02 DIAGNOSIS — F17210 Nicotine dependence, cigarettes, uncomplicated: Secondary | ICD-10-CM

## 2018-03-02 DIAGNOSIS — M778 Other enthesopathies, not elsewhere classified: Secondary | ICD-10-CM

## 2018-03-02 MED ORDER — NAPROXEN 500 MG PO TABS: 500.0000 mg | ORAL_TABLET | Freq: Two times a day (BID) | ORAL | 5 refills | Status: DC

## 2018-03-02 NOTE — Progress Notes (Signed)
Patient Judy Boyd, female DOB:[birth year], 57 y.o. [id number]  Chief Complaint  Patient presents with  . Wrist Pain    left    HPI  Judy Boyd is a 57 y.o. female who has recurrence of pain of the left wrist.  She has pain and swelling of the left wrist on the ulnar side volar.  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  She has a splint that she uses.  She has no new trauma, no weakness, no redness, no numbness.   Body mass index is 19.31 kg/m.  ROS  Review of Systems  Constitutional:       Patient does not have Diabetes Mellitus. Patient does not have hypertension. Patient has COPD or shortness of breath. Patient does not have BMI > 35. Patient has current smoking history.  HENT: Positive for congestion.   Respiratory: Positive for cough and shortness of breath.   Endocrine: Positive for cold intolerance.  Musculoskeletal: Positive for back pain.  Allergic/Immunologic: Positive for environmental allergies.  Psychiatric/Behavioral: The patient is nervous/anxious.   All other systems reviewed and are negative.   All other systems reviewed and are negative.  Past Medical History:  Diagnosis Date  . Anxiety   . Arthritis   . Asthma   . Bowel obstruction ([place])   . Chronic pain   . COPD (chronic obstructive pulmonary disease) ([place])   . Depression   . GERD (gastroesophageal reflux disease)   . Hypertension     Past Surgical History:  Procedure Laterality Date  . ABDOMINAL SURGERY  2003   blockage  . left salpingectomy    . uterine ablation      Family History  Problem Relation Age of Onset  . Heart disease Mother   . Heart disease Father     Social History Social History   Tobacco Use  . Smoking status: Current Every Day Smoker    Packs/day: 1.00    Years: 10.00    Pack years: 10.00    Types: Cigarettes  . Smokeless tobacco: Never Used  Substance Use Topics  . Alcohol use: Yes    Comment: 2 beers per week   . Drug use: No    Allergies  Allergen Reactions  . Amlodipine Other (See Comments)    Dehydration     Current Outpatient Medications  Medication Sig Dispense Refill  . albuterol (PROVENTIL HFA;VENTOLIN HFA) 108 (90 BASE) MCG/ACT inhaler Inhale 2 puffs into the lungs every 6 (six) hours as needed. Shortness of breath    . aspirin EC 81 MG tablet Take 81 mg by mouth daily.    . budesonide-formoterol (SYMBICORT) 160-4.5 MCG/ACT inhaler Inhale 2 puffs into the lungs 2 (two) times daily.     . clorazepate (TRANXENE) 7.5 MG tablet Take 7.5 mg by mouth 2 (two) times daily as needed for anxiety.    . cyclobenzaprine (FLEXERIL) 5 MG tablet Take 1 tablet (5 mg total) by mouth 2 (two) times daily as needed for muscle spasms. 15 tablet 0  . diclofenac sodium (VOLTAREN) 1 % GEL Apply 2 g topically 4 (four) times daily. 1 Tube 1  . hydrochlorothiazide (HYDRODIURIL) 12.5 MG tablet TK 1 T PO BID  3  . meloxicam (MOBIC) 15 MG tablet Take 1 tablet (15 mg total) by mouth daily. 30 tablet 5  . montelukast (SINGULAIR) 10 MG tablet Take 10 mg by mouth at bedtime.      . verapamil (CALAN) 120 MG tablet Take 1  tablet by mouth 3 (three) times daily.  0  . gabapentin (NEURONTIN) 300 MG capsule Take 1 capsule (300 mg total) by mouth at bedtime. 30 capsule 1  . naproxen (NAPROSYN) 500 MG tablet Take 1 tablet (500 mg total) by mouth 2 (two) times daily with a meal. 60 tablet 5   No current facility-administered medications for this visit.      Physical Exam  Blood pressure (!) 192/96, pulse 84, height 5\' 8"  (1.727 m), weight 127 lb (57.6 kg).  Constitutional: overall normal hygiene, normal nutrition, well developed, normal grooming, normal body habitus. Assistive device:none  Musculoskeletal: gait and station Limp none, muscle tone and strength are normal, no tremors or atrophy is present.  .  Neurological: coordination overall normal.  Deep tendon reflex/nerve stretch intact.  Sensation normal.  Cranial  nerves II-XII intact.   Skin:   Normal overall no scars, lesions, ulcers or rashes. No psoriasis.  Psychiatric: Alert and oriented x 3.  Recent memory intact, remote memory unclear.  Normal mood and affect. Well groomed.  Good eye contact.  Cardiovascular: overall no swelling, no varicosities, no edema bilaterally, normal temperatures of the legs and arms, no clubbing, cyanosis and good capillary refill.  Lymphatic: palpation is normal.  Left wrist has some slight swelling on volar side near ulnar distally.  She has full motion, NV intact.  All other systems reviewed and are negative   The patient has been educated about the nature of the problem(s) and counseled on treatment options.  The patient appeared to understand what I have discussed and is in agreement with it.  Encounter Diagnoses  Name Primary?  . Tendinitis of left wrist Yes  . Cigarette nicotine dependence without complication     PLAN Call if any problems.  Precautions discussed.  Continue current medications.   Return to clinic prn For work:  No lifting greater than 8 pounds, no stacking.   Electronically Signed Sanjuana Kava, MD 7/16/20199:49 AM

## 2018-03-02 NOTE — Patient Instructions (Addendum)
Steps to Quit Smoking Smoking tobacco can be bad for your health. It can also affect almost every organ in your body. Smoking puts you and people around you at risk for many serious long-lasting (chronic) diseases. Quitting smoking is hard, but it is one of the best things that you can do for your health. It is never too late to quit. What are the benefits of quitting smoking? When you quit smoking, you lower your risk for getting serious diseases and conditions. They can include:  Lung cancer or lung disease.  Heart disease.  Stroke.  Heart attack.  Not being able to have children (infertility).  Weak bones (osteoporosis) and broken bones (fractures).  If you have coughing, wheezing, and shortness of breath, those symptoms may get better when you quit. You may also get sick less often. If you are pregnant, quitting smoking can help to lower your chances of having a baby of low birth weight. What can I do to help me quit smoking? Talk with your doctor about what can help you quit smoking. Some things you can do (strategies) include:  Quitting smoking totally, instead of slowly cutting back how much you smoke over a period of time.  Going to in-person counseling. You are more likely to quit if you go to many counseling sessions.  Using resources and support systems, such as: ? Online chats with a counselor. ? Phone quitlines. ? Printed self-help materials. ? Support groups or group counseling. ? Text messaging programs. ? Mobile phone apps or applications.  Taking medicines. Some of these medicines may have nicotine in them. If you are pregnant or breastfeeding, do not take any medicines to quit smoking unless your doctor says it is okay. Talk with your doctor about counseling or other things that can help you.  Talk with your doctor about using more than one strategy at the same time, such as taking medicines while you are also going to in-person counseling. This can help make  quitting easier. What things can I do to make it easier to quit? Quitting smoking might feel very hard at first, but there is a lot that you can do to make it easier. Take these steps:  Talk to your family and friends. Ask them to support and encourage you.  Call phone quitlines, reach out to support groups, or work with a counselor.  Ask people who smoke to not smoke around you.  Avoid places that make you want (trigger) to smoke, such as: ? Bars. ? Parties. ? Smoke-break areas at work.  Spend time with people who do not smoke.  Lower the stress in your life. Stress can make you want to smoke. Try these things to help your stress: ? Getting regular exercise. ? Deep-breathing exercises. ? Yoga. ? Meditating. ? Doing a body scan. To do this, close your eyes, focus on one area of your body at a time from head to toe, and notice which parts of your body are tense. Try to relax the muscles in those areas.  Download or buy apps on your mobile phone or tablet that can help you stick to your quit plan. There are many free apps, such as QuitGuide from the CDC (Centers for Disease Control and Prevention). You can find more support from smokefree.gov and other websites.  This information is not intended to replace advice given to you by your health care provider. Make sure you discuss any questions you have with your health care provider. Document Released: 05/31/2009 Document   Revised: 04/01/2016 Document Reviewed: 12/19/2014 Elsevier Interactive Patient Education  [employer].  For work:  No lifting greater than 8 pounds, no stacking.

## 2018-03-15 ENCOUNTER — Telehealth: Payer: Self-pay | Admitting: Orthopaedic Surgery

## 2018-03-15 NOTE — Telephone Encounter (Signed)
The most recently issued work note of 03/02/18 regarding continued restrictions, "No Lifting greater than 8 lbs" / "No stacking" is until when?  Through date of next scheduled appointment, 04/27/18 and until further notice? Please advise for patient, for her employer.

## 2018-03-16 ENCOUNTER — Encounter: Payer: Self-pay | Admitting: Orthopaedic Surgery

## 2018-03-16 NOTE — Telephone Encounter (Signed)
Until next visit.

## 2018-03-16 NOTE — Telephone Encounter (Signed)
Called patient; notified. Aware that updated note is ready for pickup. Patient also has a signed release on file; faxed to employer.

## 2018-04-27 ENCOUNTER — Ambulatory Visit: Payer: Medicaid Other | Admitting: Orthopaedic Surgery

## 2018-06-12 ENCOUNTER — Inpatient Hospital Stay (HOSPITAL_COMMUNITY)
Admission: EM | Admit: 2018-06-12 | Discharge: 2018-06-17 | DRG: 065 | Disposition: A | Discharge status: Rehabilitation Facility | Payer: Medicaid Other | Attending: Internal Medicine | Admitting: Internal Medicine

## 2018-06-12 ENCOUNTER — Other Ambulatory Visit: Payer: Self-pay

## 2018-06-12 ENCOUNTER — Encounter (HOSPITAL_COMMUNITY): Payer: Self-pay | Admitting: Emergency Medicine

## 2018-06-12 ENCOUNTER — Emergency Department (HOSPITAL_COMMUNITY): Payer: Medicaid Other

## 2018-06-12 DIAGNOSIS — K219 Gastro-esophageal reflux disease without esophagitis: Secondary | ICD-10-CM | POA: Diagnosis present

## 2018-06-12 DIAGNOSIS — F17210 Nicotine dependence, cigarettes, uncomplicated: Secondary | ICD-10-CM | POA: Diagnosis present

## 2018-06-12 DIAGNOSIS — G894 Chronic pain syndrome: Secondary | ICD-10-CM | POA: Diagnosis present

## 2018-06-12 DIAGNOSIS — I671 Cerebral aneurysm, nonruptured: Secondary | ICD-10-CM | POA: Diagnosis present

## 2018-06-12 DIAGNOSIS — R4182 Altered mental status, unspecified: Secondary | ICD-10-CM | POA: Diagnosis present

## 2018-06-12 DIAGNOSIS — I6381 Other cerebral infarction due to occlusion or stenosis of small artery: Principal | ICD-10-CM | POA: Diagnosis present

## 2018-06-12 DIAGNOSIS — E042 Nontoxic multinodular goiter: Secondary | ICD-10-CM | POA: Diagnosis present

## 2018-06-12 DIAGNOSIS — M1990 Unspecified osteoarthritis, unspecified site: Secondary | ICD-10-CM | POA: Diagnosis present

## 2018-06-12 DIAGNOSIS — I160 Hypertensive urgency: Secondary | ICD-10-CM | POA: Diagnosis present

## 2018-06-12 DIAGNOSIS — F419 Anxiety disorder, unspecified: Secondary | ICD-10-CM | POA: Diagnosis present

## 2018-06-12 DIAGNOSIS — R591 Generalized enlarged lymph nodes: Secondary | ICD-10-CM | POA: Diagnosis present

## 2018-06-12 DIAGNOSIS — I10 Essential (primary) hypertension: Secondary | ICD-10-CM | POA: Diagnosis present

## 2018-06-12 DIAGNOSIS — I639 Cerebral infarction, unspecified: Secondary | ICD-10-CM | POA: Diagnosis not present

## 2018-06-12 DIAGNOSIS — R471 Dysarthria and anarthria: Secondary | ICD-10-CM | POA: Diagnosis present

## 2018-06-12 DIAGNOSIS — Z720 Tobacco use: Secondary | ICD-10-CM

## 2018-06-12 DIAGNOSIS — E785 Hyperlipidemia, unspecified: Secondary | ICD-10-CM | POA: Diagnosis present

## 2018-06-12 DIAGNOSIS — Z7982 Long term (current) use of aspirin: Secondary | ICD-10-CM | POA: Diagnosis not present

## 2018-06-12 DIAGNOSIS — G8929 Other chronic pain: Secondary | ICD-10-CM | POA: Diagnosis present

## 2018-06-12 DIAGNOSIS — J441 Chronic obstructive pulmonary disease with (acute) exacerbation: Secondary | ICD-10-CM | POA: Diagnosis present

## 2018-06-12 DIAGNOSIS — Z8249 Family history of ischemic heart disease and other diseases of the circulatory system: Secondary | ICD-10-CM

## 2018-06-12 DIAGNOSIS — Z9119 Patient's noncompliance with other medical treatment and regimen: Secondary | ICD-10-CM

## 2018-06-12 DIAGNOSIS — G8191 Hemiplegia, unspecified affecting right dominant side: Secondary | ICD-10-CM | POA: Diagnosis present

## 2018-06-12 DIAGNOSIS — D72829 Elevated white blood cell count, unspecified: Secondary | ICD-10-CM | POA: Diagnosis present

## 2018-06-12 DIAGNOSIS — J449 Chronic obstructive pulmonary disease, unspecified: Secondary | ICD-10-CM | POA: Diagnosis not present

## 2018-06-12 DIAGNOSIS — I69351 Hemiplegia and hemiparesis following cerebral infarction affecting right dominant side: Secondary | ICD-10-CM | POA: Diagnosis not present

## 2018-06-12 DIAGNOSIS — Z9114 Patient's other noncompliance with medication regimen: Secondary | ICD-10-CM

## 2018-06-12 DIAGNOSIS — I700 Atherosclerosis of aorta: Secondary | ICD-10-CM | POA: Diagnosis present

## 2018-06-12 DIAGNOSIS — F444 Conversion disorder with motor symptom or deficit: Secondary | ICD-10-CM | POA: Diagnosis not present

## 2018-06-12 DIAGNOSIS — K089 Disorder of teeth and supporting structures, unspecified: Secondary | ICD-10-CM | POA: Diagnosis present

## 2018-06-12 DIAGNOSIS — J432 Centrilobular emphysema: Secondary | ICD-10-CM | POA: Diagnosis present

## 2018-06-12 DIAGNOSIS — J438 Other emphysema: Secondary | ICD-10-CM | POA: Diagnosis not present

## 2018-06-12 DIAGNOSIS — R29810 Facial weakness: Secondary | ICD-10-CM | POA: Diagnosis present

## 2018-06-12 DIAGNOSIS — Z888 Allergy status to other drugs, medicaments and biological substances status: Secondary | ICD-10-CM

## 2018-06-12 LAB — CBC
HCT: 46.1 % — ABNORMAL HIGH (ref 36.0–46.0)
HEMOGLOBIN: 14.2 g/dL (ref 12.0–15.0)
MCH: 27.8 pg (ref 26.0–34.0)
MCHC: 30.8 g/dL (ref 30.0–36.0)
MCV: 90.2 fL (ref 80.0–100.0)
NRBC: 0 % (ref 0.0–0.2)
Platelets: 351 10*3/uL (ref 150–400)
RBC: 5.11 MIL/uL (ref 3.87–5.11)
RDW: 12.3 % (ref 11.5–15.5)
WBC: 12.4 10*3/uL — ABNORMAL HIGH (ref 4.0–10.5)

## 2018-06-12 LAB — I-STAT CHEM 8, ED
BUN: 16 mg/dL (ref 6–20)
CALCIUM ION: 1.15 mmol/L (ref 1.15–1.40)
CHLORIDE: 107 mmol/L (ref 98–111)
Creatinine, Ser: 0.8 mg/dL (ref 0.44–1.00)
GLUCOSE: 82 mg/dL (ref 70–99)
HCT: 45 % (ref 36.0–46.0)
Hemoglobin: 15.3 g/dL — ABNORMAL HIGH (ref 12.0–15.0)
Potassium: 3.9 mmol/L (ref 3.5–5.1)
Sodium: 141 mmol/L (ref 135–145)
TCO2: 24 mmol/L (ref 22–32)

## 2018-06-12 LAB — PROTIME-INR
INR: 0.92
Prothrombin Time: 12.3 seconds (ref 11.4–15.2)

## 2018-06-12 LAB — DIFFERENTIAL
ABS IMMATURE GRANULOCYTES: 0.03 10*3/uL (ref 0.00–0.07)
Basophils Absolute: 0 10*3/uL (ref 0.0–0.1)
Basophils Relative: 0 %
Eosinophils Absolute: 0.2 10*3/uL (ref 0.0–0.5)
Eosinophils Relative: 2 %
IMMATURE GRANULOCYTES: 0 %
LYMPHS ABS: 4.5 10*3/uL — AB (ref 0.7–4.0)
Lymphocytes Relative: 36 %
MONOS PCT: 7 %
Monocytes Absolute: 0.8 10*3/uL (ref 0.1–1.0)
NEUTROS ABS: 6.8 10*3/uL (ref 1.7–7.7)
Neutrophils Relative %: 55 %

## 2018-06-12 LAB — URINALYSIS, ROUTINE W REFLEX MICROSCOPIC
BILIRUBIN URINE: NEGATIVE
Glucose, UA: NEGATIVE mg/dL
HGB URINE DIPSTICK: NEGATIVE
Ketones, ur: NEGATIVE mg/dL
Leukocytes, UA: NEGATIVE
Nitrite: NEGATIVE
PH: 7 (ref 5.0–8.0)
Protein, ur: NEGATIVE mg/dL
SPECIFIC GRAVITY, URINE: 1.003 — AB (ref 1.005–1.030)

## 2018-06-12 LAB — COMPREHENSIVE METABOLIC PANEL
ALBUMIN: 4 g/dL (ref 3.5–5.0)
ALT: 17 U/L (ref 0–44)
AST: 35 U/L (ref 15–41)
Alkaline Phosphatase: 92 U/L (ref 38–126)
Anion gap: 8 (ref 5–15)
BUN: 15 mg/dL (ref 6–20)
CHLORIDE: 106 mmol/L (ref 98–111)
CO2: 25 mmol/L (ref 22–32)
CREATININE: 0.8 mg/dL (ref 0.44–1.00)
Calcium: 9.4 mg/dL (ref 8.9–10.3)
GFR calc Af Amer: >60 mL/min (ref >60)
GLUCOSE: 86 mg/dL (ref 70–99)
Potassium: 3.9 mmol/L (ref 3.5–5.1)
Sodium: 139 mmol/L (ref 135–145)
TOTAL PROTEIN: 7.8 g/dL (ref 6.5–8.1)
Total Bilirubin: 1 mg/dL (ref 0.3–1.2)

## 2018-06-12 LAB — CBG MONITORING, ED: GLUCOSE-CAPILLARY: 85 mg/dL (ref 70–99)

## 2018-06-12 LAB — APTT: aPTT: 27 seconds (ref 24–36)

## 2018-06-12 LAB — I-STAT TROPONIN, ED: TROPONIN I, POC: 0 ng/mL (ref 0.00–0.08)

## 2018-06-12 MED ORDER — ACETAMINOPHEN 650 MG RE SUPP: 650.0000 mg | RECTAL | Status: DC | PRN

## 2018-06-12 MED ORDER — MOMETASONE FURO-FORMOTEROL FUM 200-5 MCG/ACT IN AERO
2.0000 | INHALATION_SPRAY | Freq: Two times a day (BID) | RESPIRATORY_TRACT | Status: DC
  Administered 2018-06-12 – 2018-06-17 (×10): 2 via RESPIRATORY_TRACT
  Filled 2018-06-12 (×3): qty 8.8

## 2018-06-12 MED ORDER — ACETAMINOPHEN 325 MG PO TABS: 650.0000 mg | ORAL_TABLET | ORAL | Status: DC | PRN

## 2018-06-12 MED ORDER — STROKE: EARLY STAGES OF RECOVERY BOOK
Freq: Once | Status: AC
  Administered 2018-06-12: 23:00:00
  Filled 2018-06-12: qty 1

## 2018-06-12 MED ORDER — ACETAMINOPHEN 160 MG/5ML PO SOLN: 650.0000 mg | ORAL | Status: DC | PRN

## 2018-06-12 MED ORDER — MONTELUKAST SODIUM 10 MG PO TABS
10.0000 mg | ORAL_TABLET | Freq: Every day | ORAL | Status: DC
  Administered 2018-06-12 – 2018-06-16 (×5): 10 mg via ORAL
  Filled 2018-06-12 (×5): qty 1

## 2018-06-12 MED ORDER — ENOXAPARIN SODIUM 40 MG/0.4ML ~~LOC~~ SOLN
40.0000 mg | SUBCUTANEOUS | Status: DC
  Administered 2018-06-12 – 2018-06-16 (×5): 40 mg via SUBCUTANEOUS
  Filled 2018-06-12 (×5): qty 0.4

## 2018-06-12 MED ORDER — ASPIRIN 325 MG PO TABS
325.0000 mg | ORAL_TABLET | Freq: Every day | ORAL | Status: DC
  Administered 2018-06-12 – 2018-06-16 (×5): 325 mg via ORAL
  Filled 2018-06-12 (×5): qty 1

## 2018-06-12 MED ORDER — ASPIRIN 300 MG RE SUPP: 300.0000 mg | Freq: Every day | RECTAL | Status: DC

## 2018-06-12 MED ORDER — NICOTINE 14 MG/24HR TD PT24
14.0000 mg | MEDICATED_PATCH | Freq: Once | TRANSDERMAL | Status: AC
  Administered 2018-06-12: 14 mg via TRANSDERMAL
  Filled 2018-06-12: qty 1

## 2018-06-12 MED ORDER — SENNOSIDES-DOCUSATE SODIUM 8.6-50 MG PO TABS
1.0000 | ORAL_TABLET | Freq: Every evening | ORAL | Status: DC | PRN
  Administered 2018-06-17: 1 via ORAL
  Filled 2018-06-12: qty 1

## 2018-06-12 MED ORDER — ALBUTEROL SULFATE (2.5 MG/3ML) 0.083% IN NEBU: 3.0000 mL | INHALATION_SOLUTION | Freq: Four times a day (QID) | RESPIRATORY_TRACT | Status: DC | PRN

## 2018-06-12 MED ORDER — HYDRALAZINE HCL 20 MG/ML IJ SOLN
10.0000 mg | INTRAMUSCULAR | Status: DC | PRN
  Administered 2018-06-14: 10 mg via INTRAVENOUS
  Filled 2018-06-12: qty 1

## 2018-06-12 MED ORDER — ENSURE ENLIVE PO LIQD
237.0000 mL | Freq: Two times a day (BID) | ORAL | Status: DC
  Administered 2018-06-13 – 2018-06-17 (×7): 237 mL via ORAL

## 2018-06-12 NOTE — ED Notes (Signed)
EDP at bedside updating patient and family. 

## 2018-06-12 NOTE — H&P (Signed)
History and Physical  DOHA BOLING PJA:[id number] DOB: [birth year] DOA: 06/12/2018  Referring physician: Dr Lacinda Axon, ED physician PCP: Patient, No Pcp Per  Outpatient Specialists:   Patient Coming From: home  Chief Complaint: Right leg and arm weakness, slurred speech  HPI: Judy Boyd is a 57 y.o. female with a history of hypertension, anxiety, depression, COPD, GERD.  Patient seen for 2 days of right-sided weakness that started 2 days ago and did not improve.  Patient came to the emergency department for evaluation due to inability to walk today.  Symptoms are worsening.  Palliating or provoking factors.  Emergency Department Course: CT shows evidence of remote lacunar infarcts in left caudate.  Review of Systems:   Pt denies any fevers, chills, nausea, vomiting, diarrhea, constipation, abdominal pain, shortness of breath, dyspnea on exertion, orthopnea, cough, wheezing, palpitations, headache, vision changes, lightheadedness, dizziness, melena, rectal bleeding.  Review of systems are otherwise negative  Past Medical History:  Diagnosis Date  . Anxiety   . Arthritis   . Asthma   . Bowel obstruction ([place])   . Chronic pain   . COPD (chronic obstructive pulmonary disease) ([place])   . Depression   . GERD (gastroesophageal reflux disease)   . Hypertension    Past Surgical History:  Procedure Laterality Date  . ABDOMINAL SURGERY  2003   blockage  . left salpingectomy    . uterine ablation     Social History:  reports that she has been smoking cigarettes. She has a 10.00 pack-year smoking history. She has never used smokeless tobacco. She reports that she drinks alcohol. She reports that she does not use drugs. Patient lives at home  Allergies  Allergen Reactions  . Amlodipine Other (See Comments)    Dehydration     Family History  Problem Relation Age of Onset  . Heart disease Mother   . Heart disease Father       Prior to Admission medications   Medication  Sig Start Date End Date Taking? Authorizing Provider  albuterol (PROVENTIL HFA;VENTOLIN HFA) 108 (90 BASE) MCG/ACT inhaler Inhale 2 puffs into the lungs every 6 (six) hours as needed. Shortness of breath   Yes [provider]  budesonide-formoterol (SYMBICORT) 160-4.5 MCG/ACT inhaler Inhale 2 puffs into the lungs 2 (two) times daily.    Yes [provider]  aspirin EC 81 MG tablet Take 81 mg by mouth daily.    [provider]  clorazepate (TRANXENE) 7.5 MG tablet Take 7.5 mg by mouth 2 (two) times daily as needed for anxiety.    [provider]  gabapentin (NEURONTIN) 300 MG capsule Take 1 capsule (300 mg total) by mouth at bedtime. Patient not taking: Reported on 06/12/2018 12/11/16 01/10/17  Jamse Arn, MD  hydrochlorothiazide (HYDRODIURIL) 12.5 MG tablet TK 1 T PO BID 11/29/15   [provider]  meloxicam (MOBIC) 15 MG tablet Take 1 tablet (15 mg total) by mouth daily. Patient not taking: Reported on 06/12/2018 06/18/17   Sanjuana Kava, MD  montelukast (SINGULAIR) 10 MG tablet Take 10 mg by mouth at bedtime.      [provider]  verapamil (CALAN) 120 MG tablet Take 1 tablet by mouth 3 (three) times daily. 05/28/15   [provider]    Physical Exam: BP (!) 198/122   Pulse 85   Temp 98.5 F (36.9 C) (Oral)   Resp 13   Ht 5\' 6"  (1.676 m)   Wt 54.4 kg   SpO2 98%  BMI 19.37 kg/m   . General: Middle-aged Caucasian female. Awake and alert and oriented x3. No acute cardiopulmonary distress.  Marland Kitchen HEENT: Normocephalic atraumatic.  Right and left ears normal in appearance.  Pupils equal, round, reactive to light. Extraocular muscles are intact. Sclerae anicteric and noninjected.  Moist mucosal membranes. No mucosal lesions.  . Neck: Neck supple without lymphadenopathy. No carotid bruits. No masses palpated.  . Cardiovascular: Regular rate with normal S1-S2 sounds. No murmurs, rubs, gallops auscultated. No JVD.  Marland Kitchen Respiratory:  Good respiratory effort with no wheezes, rales, rhonchi. Lungs clear to auscultation bilaterally.  No accessory muscle use. . Abdomen: Soft, nontender, nondistended. Active bowel sounds. No masses or hepatosplenomegaly  . Skin: No rashes, lesions, or ulcerations.  Dry, warm to touch. 2+ dorsalis pedis and radial pulses. . Musculoskeletal: No calf or leg pain. All major joints not erythematous nontender.  No upper or lower joint deformation.  Good ROM.  No contractures  . Psychiatric: Intact judgment and insight. Pleasant and cooperative. . Neurologic: Right facial droop, diminished grip strength in right hand.  Weakness of right tibialis and right strings.  Coordination diminished on the right.           Labs on Admission: I have personally reviewed following labs and imaging studies  CBC: Recent Labs  Lab 06/12/18 1451 06/12/18 1500  WBC 12.4*  --   NEUTROABS 6.8  --   HGB 14.2 15.3*  HCT 46.1* 45.0  MCV 90.2  --   PLT 351  --    Basic Metabolic Panel: Recent Labs  Lab 06/12/18 1451 06/12/18 1500  NA 139 141  K 3.9 3.9  CL 106 107  CO2 25  --   GLUCOSE 86 82  BUN 15 16  CREATININE 0.80 0.80  CALCIUM 9.4  --    GFR: Estimated Creatinine Clearance: 68.2 mL/min (by C-G formula based on SCr of 0.8 mg/dL). Liver Function Tests: Recent Labs  Lab 06/12/18 1451  AST 35  ALT 17  ALKPHOS 92  BILITOT 1.0  PROT 7.8  ALBUMIN 4.0   No results for input(s): LIPASE, AMYLASE in the last 168 hours. No results for input(s): AMMONIA in the last 168 hours. Coagulation Profile: Recent Labs  Lab 06/12/18 1451  INR 0.92   Cardiac Enzymes: No results for input(s): CKTOTAL, CKMB, CKMBINDEX, TROPONINI in the last 168 hours. BNP (last 3 results) No results for input(s): PROBNP in the last 8760 hours. HbA1C: No results for input(s): HGBA1C in the last 72 hours. CBG: Recent Labs  Lab 06/12/18 1500  GLUCAP 85   Lipid Profile: No results for input(s): CHOL, HDL, LDLCALC,  TRIG, CHOLHDL, LDLDIRECT in the last 72 hours. Thyroid Function Tests: No results for input(s): TSH, T4TOTAL, FREET4, T3FREE, THYROIDAB in the last 72 hours. Anemia Panel: No results for input(s): VITAMINB12, FOLATE, FERRITIN, TIBC, IRON, RETICCTPCT in the last 72 hours. Urine analysis:    Component Value Date/Time   COLORURINE STRAW (A) 06/12/2018 1454   APPEARANCEUR CLEAR 06/12/2018 1454   LABSPEC 1.003 (L) 06/12/2018 1454   PHURINE 7.0 06/12/2018 1454   GLUCOSEU NEGATIVE 06/12/2018 1454   HGBUR NEGATIVE 06/12/2018 1454   BILIRUBINUR NEGATIVE 06/12/2018 1454   KETONESUR NEGATIVE 06/12/2018 1454   PROTEINUR NEGATIVE 06/12/2018 1454   UROBILINOGEN 0.2 04/06/2012 1430   NITRITE NEGATIVE 06/12/2018 1454   LEUKOCYTESUR NEGATIVE 06/12/2018 1454   Sepsis Labs: @LABRCNTIP (procalcitonin:4,lacticidven:4) )No results found for this or any previous visit (from the past 240 hour(s)).   Radiological Exams  on Admission: Ct Head Wo Contrast  Result Date: 06/12/2018 CLINICAL DATA:  Mental status change, facial droop and slurred speech. EXAM: CT HEAD WITHOUT CONTRAST TECHNIQUE: Contiguous axial images were obtained from the base of the skull through the vertex without intravenous contrast. COMPARISON:  None. FINDINGS: Brain: Small ill-defined areas of low attenuation in the region of the left caudate head likely remote lacunar type infarcts particularly given the fact that there is slight ex vacuo dilatation of the frontal horn this area. No CT findings for hemispheric infarction or intracranial hemorrhage. No extra-axial fluid collections are identified. The gray-white differentiation is maintained. The brainstem and cerebellum are grossly normal. Vascular: No hyperdense vessel or unexpected calcification. Skull: No fracture or bone lesion. Sinuses/Orbits: The paranasal sinuses and mastoid air cells are clear. The globes are intact. Other: No scalp lesions or hematoma. IMPRESSION: 1. Probable remote  lacunar type infarcts in the left caudate area. 2. No CT findings for acute hemispheric infarction or intracranial hemorrhage. Electronically Signed   By: Marijo Sanes M.D.   On: 06/12/2018 15:56    EKG: Independently reviewed.  Sinus rhythm  Assessment/Plan: Principal Problem:   Lacunar stroke (HCC) Active Problems:   GERD (gastroesophageal reflux disease)   Hypertension    This patient was discussed with the ED physician, including pertinent vitals, physical exam findings, labs, and imaging.  We also discussed care given by the ED provider.  1. Lacunar stroke Admit on telemetry MRI/MRA head CTA neck  Echocardiogram tomorrow Hemoglobin A1c, lipid panel in the morning PT/OT/speech therapy consult Full aspirin 2. GERD a. PPI 3. Hypertension a. Permissive hypertension per neuro  DVT prophylaxis: lovenox Consultants: none Code Status: full Family Communication:   Disposition Plan: pending   Truett Mainland, DO [facility] Pager [phone]  If 7PM-7AM, please contact night-coverage [email or web address] Password [email or web address]

## 2018-06-12 NOTE — ED Provider Notes (Addendum)
[facility] EMERGENCY DEPARTMENT Provider Note   CSN: [id number] Arrival date & time: 06/12/18  1425     History   Chief Complaint Chief Complaint  Patient presents with  . Altered Mental Status    HPI Judy Boyd is a 57 y.o. female.  Level 5 caveat for acuity of condition.  Patient reports slurred speech, visual changes, facial droop, ataxic gait since Thursday.  She has been noncompliant with her blood pressure medication for a long time.  Past medical history: hypertension, COPD, GERD, depression, bowel obstruction. Social history: She works at [employer].     Past Medical History:  Diagnosis Date  . Anxiety   . Arthritis   . Asthma   . Bowel obstruction ([place])   . Chronic pain   . COPD (chronic obstructive pulmonary disease) ([place])   . Depression   . GERD (gastroesophageal reflux disease)   . Hypertension     Patient Active Problem List   Diagnosis Date Noted  . Lacunar stroke ([place]) 06/12/2018  . Hypertension   . COPD (chronic obstructive pulmonary disease) ([place])   . GERD (gastroesophageal reflux disease) 04/06/2012  . Abdominal pain 04/06/2012  . Nausea & vomiting 04/06/2012  . Melena 04/06/2012    Past Surgical History:  Procedure Laterality Date  . ABDOMINAL SURGERY  2003   blockage  . left salpingectomy    . uterine ablation       OB History   None      Home Medications    Prior to Admission medications   Medication Sig Start Date End Date Taking? Authorizing Provider  albuterol (PROVENTIL HFA;VENTOLIN HFA) 108 (90 BASE) MCG/ACT inhaler Inhale 2 puffs into the lungs every 6 (six) hours as needed. Shortness of breath   Yes [provider]  budesonide-formoterol (SYMBICORT) 160-4.5 MCG/ACT inhaler Inhale 2 puffs into the lungs 2 (two) times daily.    Yes [provider]  aspirin EC 81 MG tablet Take 81 mg by mouth daily.    [provider]  clorazepate (TRANXENE) 7.5 MG tablet Take 7.5 mg by mouth 2  (two) times daily as needed for anxiety.    [provider]  gabapentin (NEURONTIN) 300 MG capsule Take 1 capsule (300 mg total) by mouth at bedtime. Patient not taking: Reported on 06/12/2018 12/11/16 01/10/17  Jamse Arn, MD  hydrochlorothiazide (HYDRODIURIL) 12.5 MG tablet TK 1 T PO BID 11/29/15   [provider]  meloxicam (MOBIC) 15 MG tablet Take 1 tablet (15 mg total) by mouth daily. Patient not taking: Reported on 06/12/2018 06/18/17   Sanjuana Kava, MD  montelukast (SINGULAIR) 10 MG tablet Take 10 mg by mouth at bedtime.      [provider]  verapamil (CALAN) 120 MG tablet Take 1 tablet by mouth 3 (three) times daily. 05/28/15   [provider]    Family History Family History  Problem Relation Age of Onset  . Heart disease Mother   . Heart disease Father     Social History Social History   Tobacco Use  . Smoking status: Current Every Day Smoker    Packs/day: 1.00    Years: 10.00    Pack years: 10.00    Types: Cigarettes  . Smokeless tobacco: Never Used  Substance Use Topics  . Alcohol use: Yes    Comment: 2 beers per week  . Drug use: No     Allergies   Amlodipine   Review of Systems Review of Systems  Unable to perform ROS: Acuity of condition     Physical Exam Updated Vital Signs BP (!) 198/122   Pulse 85   Temp 98.5 F (36.9 C) (Oral)   Resp 13   Ht 5\' 6"  (1.676 m)   Wt 54.4 kg   SpO2 98%   BMI 19.37 kg/m   Physical Exam  Constitutional: She is oriented to person, place, and time.  Alert, slurred speech  HENT:  Head: Normocephalic and atraumatic.  ? Facial droop  Eyes: Conjunctivae are normal.  Neck: Neck supple.  Cardiovascular: Normal rate and regular rhythm.  Pulmonary/Chest: Effort normal and breath sounds normal.  Abdominal: Soft. Bowel sounds are normal.  Musculoskeletal:  Moving all 4 extremities.  Did not ambulate patient.  Neurological: She is alert and oriented to person, place, and  time.  Skin: Skin is warm and dry.  Psychiatric:  Flat affect  Nursing note and vitals reviewed.    ED Treatments / Results  Labs (all labs ordered are listed, but only abnormal results are displayed) Labs Reviewed  CBC - Abnormal; Notable for the following components:      Result Value   WBC 12.4 (*)    HCT 46.1 (*)    All other components within normal limits  DIFFERENTIAL - Abnormal; Notable for the following components:   Lymphs Abs 4.5 (*)    All other components within normal limits  URINALYSIS, ROUTINE W REFLEX MICROSCOPIC - Abnormal; Notable for the following components:   Color, Urine STRAW (*)    Specific Gravity, Urine 1.003 (*)    All other components within normal limits  I-STAT CHEM 8, ED - Abnormal; Notable for the following components:   Hemoglobin 15.3 (*)    All other components within normal limits  PROTIME-INR  APTT  COMPREHENSIVE METABOLIC PANEL  I-STAT TROPONIN, ED  CBG MONITORING, ED    EKG EKG Interpretation  Date/Time:  Saturday June 12 2018 14:52:14 EDT Ventricular Rate:  64 PR Interval:    QRS Duration: 68 QT Interval:  442 QTC Calculation: 456 R Axis:   72 Text Interpretation:  Sinus rhythm Biatrial enlargement Confirmed by Nat Christen [phone]) on 06/12/2018 7:20:39 PM   Radiology Ct Head Wo Contrast  Result Date: 06/12/2018 CLINICAL DATA:  Mental status change, facial droop and slurred speech. EXAM: CT HEAD WITHOUT CONTRAST TECHNIQUE: Contiguous axial images were obtained from the base of the skull through the vertex without intravenous contrast. COMPARISON:  None. FINDINGS: Brain: Small ill-defined areas of low attenuation in the region of the left caudate head likely remote lacunar type infarcts particularly given the fact that there is slight ex vacuo dilatation of the frontal horn this area. No CT findings for hemispheric infarction or intracranial hemorrhage. No extra-axial fluid collections are identified. The gray-white  differentiation is maintained. The brainstem and cerebellum are grossly normal. Vascular: No hyperdense vessel or unexpected calcification. Skull: No fracture or bone lesion. Sinuses/Orbits: The paranasal sinuses and mastoid air cells are clear. The globes are intact. Other: No scalp lesions or hematoma. IMPRESSION: 1. Probable remote lacunar type infarcts in the left caudate area. 2. No CT findings for acute hemispheric infarction or intracranial hemorrhage. Electronically Signed   By: Marijo Sanes M.D.   On: 06/12/2018 15:56    Procedures Procedures (including critical care time)  Medications Ordered in ED Medications - No data to display   Initial Impression / Assessment and Plan / ED Course  I have reviewed the triage vital signs and the nursing  notes.  Pertinent labs & imaging results that were available during my care of the patient were reviewed by me and considered in my medical decision making (see chart for details).     History and physical consistent with CVA.  CT head reveal probable remote infarcts in left caudate area.  Suspect untreated hypertension as the etiology.  Discussed with neurologist at [facility].  He did not recommend aggressive lowering of the blood pressure at this time.  Admit to hospitalist service.  CRITICAL CARE Performed by: Nat Christen Total critical care time: 30 minutes Critical care time was exclusive of separately billable procedures and treating other patients. Critical care was necessary to treat or prevent imminent or life-threatening deterioration. Critical care was time spent personally by me on the following activities: development of treatment plan with patient and/or surrogate as well as nursing, discussions with consultants, evaluation of patient's response to treatment, examination of patient, obtaining history from patient or surrogate, ordering and performing treatments and interventions, ordering and review of laboratory studies, ordering  and review of radiographic studies, pulse oximetry and re-evaluation of patient's condition.  Final Clinical Impressions(s) / ED Diagnoses   Final diagnoses:  Cerebrovascular accident (CVA), unspecified mechanism [facility])    ED Discharge Orders    None       Nat Christen, MD 06/12/18 1739    Nat Christen, MD 06/12/18 Gabriel Rung    Nat Christen, MD 06/12/18 1920

## 2018-06-12 NOTE — ED Notes (Signed)
Judy Boyd [phone]

## 2018-06-12 NOTE — ED Triage Notes (Signed)
Pt states her daughter brought her in to be seen for altered mental status, facial droop, and slurred speech.  Pt states she started having problems seeing on Thursday and developed trouble speaking last night.

## 2018-06-12 NOTE — ED Notes (Signed)
Per Dr. Nehemiah Settle, pt can have a heart healthy diet. Ordered tray for pt. When tray brought to the pt, pt stated, "I don't want to eat that shit." Tray taken away. Family member at bedside said she was going to bring something for pt.

## 2018-06-12 NOTE — ED Notes (Signed)
Patient transported to CT 

## 2018-06-13 ENCOUNTER — Inpatient Hospital Stay (HOSPITAL_COMMUNITY): Payer: Medicaid Other

## 2018-06-13 DIAGNOSIS — Z720 Tobacco use: Secondary | ICD-10-CM

## 2018-06-13 DIAGNOSIS — I5030 Unspecified diastolic (congestive) heart failure: Secondary | ICD-10-CM

## 2018-06-13 DIAGNOSIS — I639 Cerebral infarction, unspecified: Secondary | ICD-10-CM

## 2018-06-13 LAB — LIPID PANEL
CHOL/HDL RATIO: 4.2 ratio
CHOLESTEROL: 222 mg/dL — AB (ref 0–200)
HDL: 53 mg/dL (ref >40)
LDL Cholesterol: 143 mg/dL — ABNORMAL HIGH (ref 0–99)
Triglycerides: 131 mg/dL (ref <150)
VLDL: 26 mg/dL (ref 0–40)

## 2018-06-13 LAB — HEMOGLOBIN A1C
HEMOGLOBIN A1C: 4.6 % — AB (ref 4.8–5.6)
MEAN PLASMA GLUCOSE: 85.32 mg/dL

## 2018-06-13 MED ORDER — AMOXICILLIN-POT CLAVULANATE 875-125 MG PO TABS
1.0000 | ORAL_TABLET | Freq: Two times a day (BID) | ORAL | Status: DC
  Administered 2018-06-13 – 2018-06-17 (×9): 1 via ORAL
  Filled 2018-06-13 (×9): qty 1

## 2018-06-13 MED ORDER — IOPAMIDOL (ISOVUE-370) INJECTION 76%
75.0000 mL | Freq: Once | INTRAVENOUS | Status: AC | PRN
  Administered 2018-06-13: 75 mL via INTRAVENOUS

## 2018-06-13 MED ORDER — IOPAMIDOL (ISOVUE-370) INJECTION 76%: 100.0000 mL | Freq: Once | INTRAVENOUS | Status: DC | PRN

## 2018-06-13 NOTE — Evaluation (Signed)
Physical Therapy Evaluation Patient Details Name: Judy Boyd MRN: [id number] DOB: Apr 23, 1961 Today's Date: 06/13/2018   History of Present Illness  57 year old female with a history of hypertension, COPD, depression, and chronic pain presenting with 2-day history of facial droop and dysarthria that began on 06/10/2018.  The patient complained of right upper extremity and right lower extremity weakness that started around the same time.  Her daughter subsequently saw and spoke with the patient on the morning of 06/12/2018 and urged the patient to be evaluated.  The patient has not seen a primary care physician for over 3 months.  She has not been on any of her prescribed medications including aspirin or antihypertensive medications.  However, the patient was taking naproxen for her left wrist tendinitis.  The patient denies any fevers, chills, headache, chest pain, shortness breath, nausea, vomiting, diarrhea, abdominal pain, dysuria, hematuria.  Unfortunately, the patient has been smoking 1/2 pack/day.  In the emergency department, the patient was afebrile hemodynamically stable.  She did have a low-grade temperature of 99.1 F.  WBC is 12.4.  EKG shows sinus rhythm with nonspecific T wave change.  BMP was unremarkable.  Neurology was contacted by EDP, and they felt the patient could state any pain.    Clinical Impression  Judy Boyd is a 57 y.o. presenting for PT evaluation with recent decrease in functional mobility secondary to above admission for suspected Lt CVA. She is currently functioning below her baseline of independent with no device, and now requires min to moderate assist while mobilizing to prevent LOB during transfers/gait. At baseline she is working full time, and independent with dressing, bathing, and all ADL's/IADLs. She lives alone and does not have any assistive devices at home. She is highly motivated and willing to participate in therapy and will benefit from skilled PT  to address current impairments and from follow up PT at below venue to improve independence and safety with mobility to progress towards PLOF.  Acute PT will follow.     Follow Up Recommendations CIR    Equipment Recommendations  Rolling walker with 5" wheels    Recommendations for Other Services Rehab consult     Precautions / Restrictions Precautions Precautions: Fall Restrictions Weight Bearing Restrictions: No      Mobility  Bed Mobility Overal bed mobility: Modified Independent      General bed mobility comments: HOB elevate at 30 degrees per precautions  Transfers Overall transfer level: Needs assistance Equipment used: Rolling walker (2 wheeled) Transfers: Sit to/from Stand Sit to Stand: Min guard     Ambulation/Gait Ambulation/Gait assistance: Min assist;Mod assist Gait Distance (Feet): 80 Feet Assistive device: Rolling walker (2 wheeled);1 person hand held assist Gait Pattern/deviations: Step-through pattern;Decreased stride length;Decreased dorsiflexion - right;Scissoring;Narrow base of support;Drifts right/left     General Gait Details: patient ambulated in hospital room from bed to sink with hand held assist and min to moderate assistand to prevent LOB. She had improved steadiness with gait using RW however required min assist to prevent LOB on 2 occasions sue to scissoring step pattern.   Modified Rankin (Stroke Patients Only) Modified Rankin (Stroke Patients Only) Pre-Morbid Rankin Score: No symptoms Modified Rankin: Moderately severe disability     Balance Overall balance assessment: Needs assistance Sitting-balance support: No upper extremity supported;Feet supported Sitting balance-Leahy Scale: Good     Standing balance support: Bilateral upper extremity supported Standing balance-Leahy Scale: Fair Standing balance comment: pt requires support form bil or unilateral UE's at counter while washing face  at sink Single Leg Stance - Right Leg:  0 Single Leg Stance - Left Leg: 3            Pertinent Vitals/Pain Pain Assessment: No/denies pain    Home Living Family/patient expects to be discharged to:: Private residence Living Arrangements: Alone Available Help at Discharge: Family Type of Home: Mobile home Home Access: Stairs to enter Entrance Stairs-Rails: Can reach both Entrance Stairs-Number of Steps: 4 Home Layout: One level Home Equipment: None      Prior Function Level of Independence: Independent         Comments: Patient employed at [employer] and works on [employer], she lives alone and is independent with all ADL's, IADL,s and funcitonal mobility     Hand Dominance   Dominant Hand: Right    Extremity/Trunk Assessment   Upper Extremity Assessment Upper Extremity Assessment: RUE deficits/detail;Defer to OT evaluation RUE Deficits / Details: pt with limited grip strength on Rt UE    Lower Extremity Assessment Lower Extremity Assessment: Generalized weakness;RLE deficits/detail RLE Deficits / Details: 3+ to 4-/5 throughout Rt LE for hip flexion/extension, knee flex/extension, and dorsiflexion RLE Coordination: decreased gross motor    Cervical / Trunk Assessment Cervical / Trunk Assessment: Normal  Communication   Communication: Expressive difficulties  Cognition Arousal/Alertness: Awake/alert Behavior During Therapy: WFL for tasks assessed/performed Overall Cognitive Status: Within Functional Limits for tasks assessed              Assessment/Plan    PT Assessment Patient needs continued PT services  PT Problem List Decreased strength;Decreased activity tolerance;Decreased balance;Decreased safety awareness;Decreased mobility;Decreased knowledge of use of DME;Decreased coordination       PT Treatment Interventions DME instruction;Balance training;Gait training;Stair training;Functional mobility training;Therapeutic activities;Therapeutic exercise;Patient/family education;Neuromuscular  re-education    PT Goals (Current goals can be found in the Care Plan section)  Acute Rehab PT Goals Patient Stated Goal: get strong enough to return home independently PT Goal Formulation: With patient Time For Goal Achievement: 06/18/18 Potential to Achieve Goals: Good    Frequency Min 5X/week   Barriers to discharge Inaccessible home environment pt has 4 stairs to ent home, reports she can stay with daughter if needed, pt reports - daughter's home has level entry, pt has no equipment        AM-PAC PT "6 Clicks" Daily Activity  Outcome Measure Difficulty turning over in bed (including adjusting bedclothes, sheets and blankets)?: A Little Difficulty moving from lying on back to sitting on the side of the bed? : A Little Difficulty sitting down on and standing up from a chair with arms (e.g., wheelchair, bedside commode, etc,.)?: A Little Help needed moving to and from a bed to chair (including a wheelchair)?: A Little Help needed walking in hospital room?: A Little Help needed climbing 3-5 steps with a railing? : A Lot 6 Click Score: 17    End of Session Equipment Utilized During Treatment: Gait belt;Other (comment)(RW) Activity Tolerance: Patient tolerated treatment well Patient left: in bed;with call bell/phone within reach;with bed alarm set Nurse Communication: Mobility status;Other (comment)(pt requiresting assistance to bathe) PT Visit Diagnosis: Unsteadiness on feet (R26.81);Difficulty in walking, not elsewhere classified (R26.2);Hemiplegia and hemiparesis;Other abnormalities of gait and mobility (R26.89);Muscle weakness (generalized) (M62.81) Hemiplegia - Right/Left: Right Hemiplegia - dominant/non-dominant: Dominant Hemiplegia - caused by: Cerebral infarction    Time: 1027-2536 PT Time Calculation (min) (ACUTE ONLY): 25 min   Charges:   PT Evaluation $PT Eval Moderate Complexity: 1 Mod PT Treatments $Therapeutic Activity: 8-22 mins  Kipp Brood,  PT, DPT Physical Therapist with [facility]  06/13/2018 10:45 AM

## 2018-06-13 NOTE — Progress Notes (Signed)
*  PRELIMINARY RESULTS* Echocardiogram 2D Echocardiogram has been performed.  Samuel [place] 06/13/2018, 2:17 PM

## 2018-06-13 NOTE — Plan of Care (Signed)
  Problem: Acute Rehab PT Goals(only PT should resolve) Goal: Pt Will Go Sit To Supine/Side Outcome: Progressing Flowsheets (Taken 06/13/2018 1046) Pt will go Sit to Supine/Side: Independently Goal: Patient Will Transfer Sit To/From Stand Outcome: Progressing Flowsheets (Taken 06/13/2018 1046) Patient will transfer sit to/from stand: Independently Goal: Pt Will Transfer Bed To Chair/Chair To Bed Outcome: Progressing Flowsheets (Taken 06/13/2018 1046) Pt will Transfer Bed to Chair/Chair to Bed: with modified independence Note:  With LRAD Goal: Pt Will Ambulate Outcome: Progressing Flowsheets (Taken 06/13/2018 1046) Pt will Ambulate: > 125 feet; with supervision; with least restrictive assistive device Goal: Pt Will Go Up/Down Stairs Outcome: Progressing Flowsheets (Taken 06/13/2018 1046) Pt will Go Up / Down Stairs: 3-5 stairs; with supervision; with rail(s)

## 2018-06-13 NOTE — Progress Notes (Signed)
Rehab Admissions Coordinator Note:  Patient was screened by Jhonnie Garner for appropriateness for an Inpatient Acute Rehab Consult.  At this time, (per nursing note today) it appears that the pt feels she is able to ambulate well enough to not follow standard fall precaution measures. AC will follow along for additional progress with therapies and if significant deficits remain, AC will follow up with pt Monday to see if there is interest in CIR.   Jhonnie Garner 06/13/2018, 3:26 PM  I can be reached at [phone].

## 2018-06-13 NOTE — Progress Notes (Signed)
Patient is not compliant with fall precaution measures. Patient and daughter states that patient ambulates well and she does not agree with physical therapist recommendations. Patient is impulsive and states she is "not laying in a bed all day" Patient reeducated on fall prevention measures. Patient has also requested a bedside commode and one was provided however patient and daughter states that patient is not going to "sit and wait to be toileted"

## 2018-06-13 NOTE — Progress Notes (Signed)
PROGRESS NOTE  Judy Boyd XAJ:[id number] DOB: 23-Oct-1960 DOA: 06/12/2018 PCP: Patient, No Pcp Per  Brief History:  57 year old female with a history of hypertension, COPD, depression, and chronic pain presenting with 2-day history of facial droop and dysarthria that began on 06/10/2018.  The patient complained of right upper extremity and right lower extremity weakness that started around the same time.  Her daughter subsequently saw and spoke with the patient on the morning of 06/12/2018 and urged the patient to be evaluated.  The patient has not seen a primary care physician for over 3 months.  She has not been on any of her prescribed medications including aspirin or antihypertensive medications.  However, the patient was taking naproxen for her left wrist tendinitis.  The patient denies any fevers, chills, headache, chest pain, shortness breath, nausea, vomiting, diarrhea, abdominal pain, dysuria, hematuria.  Unfortunately, the patient has been smoking 1/2 pack/day.  In the emergency department, the patient was afebrile hemodynamically stable.  She did have a low-grade temperature of 99.1 F.  WBC is 12.4.  EKG shows sinus rhythm with nonspecific T wave change.  BMP was unremarkable.  Neurology was contacted by EDP, and they felt the patient could state any pain.  Assessment/Plan: Acute ischemic stroke -Suspect left MCA territory --Appreciate Neurology Consult -PT/OT evaluation -Speech therapy eval -CT brain--remote lacunar infarct left caudate -MRI brain--pending -MRA brain--pending -CTA neck--patent carotid and vertebral arteries -Echo-- -LDL-- -HbA1C-- -Antiplatelet--aspirin 325 mg daily  Essential hypertension -Allow for permissive hypertension -hydralazine prn SBP>220  COPD -Stable on room air  Odontogenic infection -Start Augmentin -CT head and neck--revealed soft tissue inflammation over the right mandible with extensive dental disease and right  submandibular lymphadenopathy.  Thyroid nodule -Incidental finding of right thyroid nodule -Outpatient follow-up -Check TSH, free T4  Tobacco abuse -NicoDerm patch -I have discussed tobacco cessation with the patient.  I have counseled the patient regarding the negative impacts of continued tobacco use including but not limited to lung cancer, COPD, and cardiovascular disease.  I have discussed alternatives to tobacco and modalities that may help facilitate tobacco cessation including but not limited to biofeedback, hypnosis, and medications.  Total time spent with tobacco counseling was 4 minutes.   Disposition Plan:   Home 10/28 if stable Family Communication:  No Family at bedside  Consultants:  none  Code Status:  FULL   DVT Prophylaxis: [place] Lovenox   Procedures: As Listed in Progress Note Above  Antibiotics: None    Subjective: Patient feels that her dysarthria is not much better but states that her right arm and right leg any weakness or little bit better.  She denies any headache, chest pain shortness breath, cough, hemoptysis, nausea, vomiting, diarrhea, abdominal pain.  There is no dysuria hematuria.  Objective: Vitals:   06/13/18 0031 06/13/18 0211 06/13/18 0413 06/13/18 0625  BP: (!) 149/90 (!) 138/99 (!) 152/89 (!) 162/102  Pulse: 84 87 78 80  Resp: 18 18 18 18   Temp: 99.1 F (37.3 C) 97.7 F (36.5 C) 98.1 F (36.7 C) 98.5 F (36.9 C)  TempSrc: Oral Oral Oral Oral  SpO2: 98% 98% 97% 99%  Weight:      Height:       No intake or output data in the 24 hours ending 06/13/18 0809 Weight change:  Exam:   General:  Pt is alert, follows commands appropriately, not in acute distress  HEENT: No icterus, No thrush, No neck mass, [place]/AT  Cardiovascular: RRR, S1/S2, no rubs, no gallops  Respiratory: Diminished breath sounds, but CTA bilaterally, no wheezing, no crackles, no rhonchi  Abdomen: Soft/+BS, non tender, non distended, no guarding  Extremities: No  edema, No lymphangitis, No petechiae, No rashes, no synovitis  Neuro:  CN II-XII intact, strength 4-/5 in RUE, RLE, strength 4+/5 LUE, LLE; sensation intact bilateral; no dysmetria; babinski equivocal     Data Reviewed: I have personally reviewed following labs and imaging studies Basic Metabolic Panel: Recent Labs  Lab 06/12/18 1451 06/12/18 1500  NA 139 141  K 3.9 3.9  CL 106 107  CO2 25  --   GLUCOSE 86 82  BUN 15 16  CREATININE 0.80 0.80  CALCIUM 9.4  --    Liver Function Tests: Recent Labs  Lab 06/12/18 1451  AST 35  ALT 17  ALKPHOS 92  BILITOT 1.0  PROT 7.8  ALBUMIN 4.0   No results for input(s): LIPASE, AMYLASE in the last 168 hours. No results for input(s): AMMONIA in the last 168 hours. Coagulation Profile: Recent Labs  Lab 06/12/18 1451  INR 0.92   CBC: Recent Labs  Lab 06/12/18 1451 06/12/18 1500  WBC 12.4*  --   NEUTROABS 6.8  --   HGB 14.2 15.3*  HCT 46.1* 45.0  MCV 90.2  --   PLT 351  --    Cardiac Enzymes: No results for input(s): CKTOTAL, CKMB, CKMBINDEX, TROPONINI in the last 168 hours. BNP: Invalid input(s): POCBNP CBG: Recent Labs  Lab 06/12/18 1500  GLUCAP 85   HbA1C: No results for input(s): HGBA1C in the last 72 hours. Urine analysis:    Component Value Date/Time   COLORURINE STRAW (A) 06/12/2018 1454   APPEARANCEUR CLEAR 06/12/2018 1454   LABSPEC 1.003 (L) 06/12/2018 1454   PHURINE 7.0 06/12/2018 1454   GLUCOSEU NEGATIVE 06/12/2018 1454   HGBUR NEGATIVE 06/12/2018 1454   BILIRUBINUR NEGATIVE 06/12/2018 1454   KETONESUR NEGATIVE 06/12/2018 1454   PROTEINUR NEGATIVE 06/12/2018 1454   UROBILINOGEN 0.2 04/06/2012 1430   NITRITE NEGATIVE 06/12/2018 1454   LEUKOCYTESUR NEGATIVE 06/12/2018 1454   Sepsis Labs: @LABRCNTIP (procalcitonin:4,lacticidven:4) )No results found for this or any previous visit (from the past 240 hour(s)).   Scheduled Meds: . aspirin  300 mg Rectal Daily   Or  . aspirin  325 mg Oral Daily  .  enoxaparin (LOVENOX) injection  40 mg Subcutaneous Q24H  . feeding supplement (ENSURE ENLIVE)  237 mL Oral BID BM  . mometasone-formoterol  2 puff Inhalation BID  . montelukast  10 mg Oral QHS  . nicotine  14 mg Transdermal Once   Continuous Infusions:  Procedures/Studies: Ct Head Wo Contrast  Result Date: 06/12/2018 CLINICAL DATA:  Mental status change, facial droop and slurred speech. EXAM: CT HEAD WITHOUT CONTRAST TECHNIQUE: Contiguous axial images were obtained from the base of the skull through the vertex without intravenous contrast. COMPARISON:  None. FINDINGS: Brain: Small ill-defined areas of low attenuation in the region of the left caudate head likely remote lacunar type infarcts particularly given the fact that there is slight ex vacuo dilatation of the frontal horn this area. No CT findings for hemispheric infarction or intracranial hemorrhage. No extra-axial fluid collections are identified. The gray-white differentiation is maintained. The brainstem and cerebellum are grossly normal. Vascular: No hyperdense vessel or unexpected calcification. Skull: No fracture or bone lesion. Sinuses/Orbits: The paranasal sinuses and mastoid air cells are clear. The globes are intact. Other: No scalp lesions or hematoma. IMPRESSION: 1. Probable remote  lacunar type infarcts in the left caudate area. 2. No CT findings for acute hemispheric infarction or intracranial hemorrhage. Electronically Signed   By: Marijo Sanes M.D.   On: 06/12/2018 15:56   Ct Angio Neck W Or Wo Contrast  Result Date: 06/13/2018 CLINICAL DATA:  57 y/o F; right-sided weakness starting 4 days ago. Unable to walk. Worsening symptoms. EXAM: CT ANGIOGRAPHY NECK TECHNIQUE: Multidetector CT imaging of the neck was performed using the standard protocol during bolus administration of intravenous contrast. Multiplanar CT image reconstructions and MIPs were obtained to evaluate the vascular anatomy. Carotid stenosis measurements (when  applicable) are obtained utilizing NASCET criteria, using the distal internal carotid diameter as the denominator. CONTRAST:  25mL ISOVUE-370 IOPAMIDOL (ISOVUE-370) INJECTION 76% COMPARISON:  None. FINDINGS: Aortic arch: Standard branching. Imaged portion shows no evidence of aneurysm or dissection. No significant stenosis of the major arch vessel origins. Mild calcific atherosclerosis. Right carotid system: No evidence of dissection, stenosis (50% or greater) or occlusion. Carotid bifurcation calcified plaque without stenosis. Left carotid system: No evidence of dissection, stenosis (50% or greater) or occlusion. Carotid bifurcation fibrofatty plaque without stenosis. Vertebral arteries: Codominant. No evidence of dissection, stenosis (50% or greater) or occlusion. Skeleton: Moderate cervical spondylosis with predominant discogenic degenerative changes at C4-C6 where there is mild reversal of cervical curvature. Other neck: Thyroid nodules measuring up to 19 mm in the right lobe. Inflammation of soft tissues overlying the right body of the mandible. Extensive dental disease with multiple periapical cysts and carries including the right posterior mandibular molar subjacent inflammation. No discrete abscess identified. Right submandibular lymphadenopathy, likely reactive. Upper chest: Mild-to-moderate centrilobular emphysema. IMPRESSION: 1. Patent carotid and vertebral arteries. No dissection, occlusion, aneurysm, or hemodynamically significant stenosis by NASCET criteria. 2. Soft tissue inflammation over the right body of mandible, probable odontogenic infection, no discrete soft tissue abscess. 3. Extensive dental disease with multiple periapical cysts and carries including the right posterior mandibular molars subjacent to soft tissue inflammation. 4. Right submandibular lymphadenopathy, likely reactive. 5. Thyroid nodules measuring up to 19 mm in the right lobe. Recommend further evaluation with thyroid  ultrasound on a nonemergent basis. 6. Aortic Atherosclerosis (ICD10-I70.0) and Emphysema (ICD10-J43.9). Electronically Signed   By: Kristine Garbe M.D.   On: 06/13/2018 02:41    Orson Eva, DO  [facility] Pager [phone]  If 7PM-7AM, please contact night-coverage [email or web address] Password [email or web address] 06/13/2018, 8:09 AM   LOS: 1 day

## 2018-06-14 ENCOUNTER — Inpatient Hospital Stay (HOSPITAL_COMMUNITY): Payer: Medicaid Other

## 2018-06-14 LAB — ECHOCARDIOGRAM COMPLETE
Height: 66 in
Weight: 1920 oz

## 2018-06-14 LAB — HIV ANTIBODY (ROUTINE TESTING W REFLEX): HIV SCREEN 4TH GENERATION: NONREACTIVE

## 2018-06-14 MED ORDER — ATORVASTATIN CALCIUM 40 MG PO TABS
40.0000 mg | ORAL_TABLET | Freq: Every day | ORAL | Status: DC
  Administered 2018-06-14 – 2018-06-16 (×3): 40 mg via ORAL
  Filled 2018-06-14 (×3): qty 1

## 2018-06-14 NOTE — Progress Notes (Addendum)
Inpatient Rehabilitation-Admissions Coordinator   Spoke with pt over the phone regarding CIR program. [facility] reviewed program details and her estimated length of stay. Once pt heard that the program would take place in a hospital based system, pt was adamant that she did not want to pursue this program. When asked if she felt safe to return home or had any support at home she stated that she will have to figure something out. AC has communicated with Lidia Collum, [place] regarding phone conversation.   AC will sign off at this time.   Jhonnie Garner, OTR/L  Rehab Admissions Coordinator  [phone] 06/14/2018 11:09 AM

## 2018-06-14 NOTE — Care Management (Addendum)
Patient reports may now be interested in CIR. 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 CM will place referral to OP PT. Discussed with patient, she is aware and agreeable.

## 2018-06-14 NOTE — Care Management Note (Signed)
Case Management Note  Patient Details  Name: Judy Boyd MRN: [id number] Date of Birth: 1961/06/30  Subjective/Objective:    Stroke workup. From home alone, independent, works full time. Seen by PT and OT recommending for CIR. Patient has discussed with CIR admissions coordinator and declines CIR. Patient does not have a PCP or insurance. Discussed [facility] Connect with patient for follow up and community resources. She is agreeable, flyer with Care Connect information given and appointment times secured for October 31st at 2 pm. Patient also recommended for OP PT if CIR not an option, patient agreeable and would like [place] re[facility]. Will make referral. Patient would like prices from [place] for RW and shower seat.            Action/Plan: CM following for ongoing needs. Appt made. Vaughan Basta of [facility] notified and will talk with patient about prices.   Expected Discharge Date:  06/14/18               Expected Discharge Plan:  OP Rehab  In-House Referral:     Discharge planning Services  CM Consult, [facility], Follow-up appt scheduled  Post Acute Care Choice:  Durable Medical Equipment Choice offered to:  Patient  DME Arranged:  Walker rolling, Shower stool DME Agency:  [place]:    [place] Agency:     Status of Service:  Completed, signed off  If discussed at [employer] of Stay Meetings, dates discussed:    Additional Comments:  Judy Boyd, Judy Reading, RN 06/14/2018, 11:33 AM

## 2018-06-14 NOTE — Evaluation (Signed)
Occupational Therapy Evaluation Patient Details Name: Judy Boyd MRN: [id number] DOB: 04-09-1961 Today's Date: 06/14/2018    History of Present Illness 57 year old female with a history of hypertension, COPD, depression, and chronic pain presenting with 2-day history of facial droop and dysarthria that began on 06/10/2018.  The patient complained of right upper extremity and right lower extremity weakness that started around the same time.  Her daughter subsequently saw and spoke with the patient on the morning of 06/12/2018 and urged the patient to be evaluated.  The patient has not seen a primary care physician for over 3 months.  She has not been on any of her prescribed medications including aspirin or antihypertensive medications.  However, the patient was taking naproxen for her left wrist tendinitis.  The patient denies any fevers, chills, headache, chest pain, shortness breath, nausea, vomiting, diarrhea, abdominal pain, dysuria, hematuria.  Unfortunately, the patient has been smoking 1/2 pack/day.  In the emergency department, the patient was afebrile hemodynamically stable.  She did have a low-grade temperature of 99.1 F.  WBC is 12.4.  EKG shows sinus rhythm with nonspecific T wave change.  BMP was unremarkable.  Neurology was contacted by EDP, and they felt the patient could state any pain.   Clinical Impression   Patient and daughter present in room and agreeable to participate in OT evaluation. Patient presents with decreased gross and fine motor coordination on the Right side which is her dominant extremity. Due to right side hemiparesis, she is unable to return to work to perform required duties. She requires min guard for ambulation and increased physical assistance for fine motor tasks. Motor coordination requires increased time due to lack of control. Patient is concerned about not being able to get up and move as she will not be able to work on getting stronger. With daughter  present in room, education was given that her daughter may get her up and be present when patient wishes to ambulate although when patient is alone she just needs to call for the nursing staff due to her right side weakness. Patient is a great candidate for CIR as she was working full time and independent prior. She is hesitant about going to CIR. If patient is not accepted to CIR she would be appropriate for Outpatient OT/PT/SLP if she has the transportation.  Case manager informed of OT evaluation findings.     Follow Up Recommendations  CIR;Outpatient OT;Supervision/Assistance - 24 hour    Equipment Recommendations  Tub/shower seat    Recommendations for Other Services Rehab consult;Speech consult     Precautions / Restrictions Precautions Precautions: Fall Precaution Comments: right side hemiparesis Restrictions Weight Bearing Restrictions: No             ADL either performed or assessed with clinical judgement   ADL Overall ADL's : Needs assistance/impaired Eating/Feeding: Bed level;Minimal assistance Eating/Feeding Details (indicate cue type and reason): to open containers and packages. Patient wears dentures.  Grooming: Wash/dry hands;Standing;Min guard               Lower Body Dressing: Minimal assistance;Sit to/from stand   Toilet Transfer: Min guard;Ambulation;BSC                   Vision Baseline Vision/History: No visual deficits Patient Visual Report: No change from baseline              Pertinent Vitals/Pain Pain Assessment: No/denies pain     Hand Dominance Right   Extremity/Trunk Assessment Upper Extremity  Assessment Upper Extremity Assessment: RUE deficits/detail RUE Deficits / Details: Decreased gross grip strength. WFL A/ROM. MMT: Shoulder flexion and abduction: 4-/5, elbow flexion: 4+/5, elbow extension: 4-/5. Decreased gross and motor coordination. Finger to nose test: impaired. Thumb opposition: impaired.  RUE Sensation:  (Appeared WFL) RUE Coordination: decreased fine motor;decreased gross motor   Lower Extremity Assessment Lower Extremity Assessment: Defer to PT evaluation   Cervical / Trunk Assessment Cervical / Trunk Assessment: Normal   Communication Communication Communication: Expressive difficulties(slower to respond.)   Cognition Arousal/Alertness: Awake/alert Behavior During Therapy: WFL for tasks assessed/performed Overall Cognitive Status: Within Functional Limits for tasks assessed                    Home Living Family/patient expects to be discharged to:: Unsure Living Arrangements: Alone Available Help at Discharge: Family Type of Home: Mobile home Home Access: Stairs to enter Technical brewer of Steps: 4 Entrance Stairs-Rails: Can reach both Home Layout: One level         Biochemist, clinical: Standard Bathroom Accessibility: Yes   Home Equipment: None          Prior Functioning/Environment Level of Independence: Independent        Comments: Patient employed at [employer] and works on [employer], she lives alone and is independent with all ADL's, IADL,s and funcitonal mobility        OT Problem List: Decreased strength;Decreased knowledge of use of DME or AE;Decreased coordination;Impaired UE functional use;Impaired balance (sitting and/or standing);Decreased activity tolerance;Decreased safety awareness      OT Treatment/Interventions: Self-care/ADL training;Modalities;Balance training;Therapeutic exercise;Splinting;Neuromuscular education;Therapeutic activities;DME and/or AE instruction;Patient/family education;Manual therapy    OT Goals(Current goals can be found in the care plan section)    OT Frequency: Min 2X/week    AM-PAC PT "6 Clicks" Daily Activity     Outcome Measure Help from another person eating meals?: A Little Help from another person taking care of personal grooming?: A Little Help from another person toileting, which includes using  toliet, bedpan, or urinal?: A Lot Help from another person bathing (including washing, rinsing, drying)?: A Little Help from another person to put on and taking off regular upper body clothing?: A Little Help from another person to put on and taking off regular lower body clothing?: A Lot 6 Click Score: 16   End of Session    Activity Tolerance: Patient tolerated treatment well Patient left: in bed;with call bell/phone within reach;with family/visitor present  OT Visit Diagnosis: Muscle weakness (generalized) (M62.81);Hemiplegia and hemiparesis Hemiplegia - Right/Left: Right Hemiplegia - dominant/non-dominant: Dominant Hemiplegia - caused by: Cerebral infarction                Time: 8676-1950 OT Time Calculation (min): 17 min Charges:  OT General Charges $OT Visit: 1 Visit OT Evaluation $OT Eval Moderate Complexity: [place], OTR/L,CBIS  [phone]  Khristina Janota, Clarene Duke 06/14/2018, 9:17 AM

## 2018-06-14 NOTE — Plan of Care (Signed)
  Problem: Acute Rehab OT Goals (only OT should resolve) Goal: Pt. Will Perform Eating Flowsheets (Taken 06/14/2018 0922) Pt Will Perform Eating: with modified independence; sitting Goal: Pt. Will Perform Grooming Flowsheets (Taken 06/14/2018 0922) Pt Will Perform Grooming: with modified independence; standing Goal: Pt. Will Perform Upper Body Bathing Flowsheets (Taken 06/14/2018 0922) Pt Will Perform Upper Body Bathing: with modified independence; sitting; standing Goal: Pt. Will Perform Lower Body Bathing Flowsheets (Taken 06/14/2018 0922) Pt Will Perform Lower Body Bathing: with modified independence; sit to/from stand Goal: Pt. Will Perform Lower Body Dressing Flowsheets (Taken 06/14/2018 0922) Pt Will Perform Lower Body Dressing: with modified independence; sitting/lateral leans; sit to/from stand Goal: Pt. Will Transfer To Toilet Flowsheets (Taken 06/14/2018 [phone]) Pt Will Transfer to Toilet: with modified independence; ambulating; regular height toilet; bedside commode; grab bars Goal: Pt. Will Perform Toileting-Clothing Manipulation Flowsheets (Taken 06/14/2018 0922) Pt Will Perform Toileting - Clothing Manipulation and hygiene: with modified independence; sitting/lateral leans; sit to/from stand Goal: Pt/Caregiver Will Perform Home Exercise Program Flowsheets (Taken 06/14/2018 [phone]) Pt/caregiver will Perform Home Exercise Program: Increased strength; Right Upper extremity; With theraputty; Independently; With written HEP provided

## 2018-06-14 NOTE — Clinical Social Work Note (Signed)
Received CSW consult stating pt needs assistance with medications and doctor's appointments. Discussed with RN CM who will follow up on these needs. Will clear CSW consult at this time.

## 2018-06-14 NOTE — Progress Notes (Signed)
Physical Therapy Treatment Patient Details Name: Judy Boyd MRN: [id number] DOB: 1961/05/12 Today's Date: 06/14/2018    History of Present Illness 57 year old female with a history of hypertension, COPD, depression, and chronic pain presenting with 2-day history of facial droop and dysarthria that began on 06/10/2018.  The patient complained of right upper extremity and right lower extremity weakness that started around the same time.  Her daughter subsequently saw and spoke with the patient on the morning of 06/12/2018 and urged the patient to be evaluated.  The patient has not seen a primary care physician for over 3 months.  She has not been on any of her prescribed medications including aspirin or antihypertensive medications.  However, the patient was taking naproxen for her left wrist tendinitis.  The patient denies any fevers, chills, headache, chest pain, shortness breath, nausea, vomiting, diarrhea, abdominal pain, dysuria, hematuria.  Unfortunately, the patient has been smoking 1/2 pack/day.  In the emergency department, the patient was afebrile hemodynamically stable.  She did have a low-grade temperature of 99.1 F.  WBC is 12.4.  EKG shows sinus rhythm with nonspecific T wave change.  BMP was unremarkable.  Neurology was contacted by EDP, and they felt the patient could state any pain.    PT Comments    Patient demonstrates limited use of RUE for sitting up at bedside, tends to scissor RLE and drag right foot due to limited dorsiflexion during gait training, after verbal cues demonstrated some improvement for right heel to toe stepping, tendency to lean on nearby objects for support when not using AD, improvement in balance noted when using single point cane [facility]) with mostly step through gait pattern, occasional 3 point gait pattern when RLE buckles.  Patient demonstrated fair/good return for completing standing exercises but has difficulty isolating hip muscles when performing  standing hip extension and abduction.  Patient tolerated sitting up at bedside with family present after therapy.  Patient will benefit from continued physical therapy in hospital and recommended venue below to increase strength, balance, endurance for safe ADLs and gait.    Follow Up Recommendations  CIR;SNF     Equipment Recommendations  Rolling walker with 5" wheels;Cane    Recommendations for Other Services Rehab consult     Precautions / Restrictions Precautions Precautions: Fall Precaution Comments: right side hemiparesis Restrictions Weight Bearing Restrictions: No    Mobility  Bed Mobility Overal bed mobility: Modified Independent             General bed mobility comments: HOB elevated, increased time with limited use of RUE  Transfers Overall transfer level: Needs assistance Equipment used: Straight cane;1 person hand held assist Transfers: Sit to/from Stand;Stand Pivot Transfers Sit to Stand: Min guard Stand pivot transfers: Min guard       General transfer comment: buckling of RLE  Ambulation/Gait Ambulation/Gait assistance: Min assist Gait Distance (Feet): 80 Feet Assistive device: 1 person hand held assist;Straight cane Gait Pattern/deviations: Decreased step length - right;Decreased stance time - right;Step-through pattern Gait velocity: decreased   General Gait Details: labored slow cadence with frequent scissoring of RLE, dragging of feet with limited dorsiflexion, has to frequently lean on nearby objects for support, improvement in balance noted when using single point cane [facility]) after a few steps, demonstrated mostly 2 point gait pattern with occasional buckling of RLE   Stairs             Wheelchair Mobility    Modified Rankin (Stroke Patients Only)  Balance Overall balance assessment: Needs assistance Sitting-balance support: Feet supported;No upper extremity supported Sitting balance-Leahy Scale: Good     Standing  balance support: During functional activity;No upper extremity supported Standing balance-Leahy Scale: Fair Standing balance comment: fair/good with SPC                            Cognition Arousal/Alertness: Awake/alert Behavior During Therapy: WFL for tasks assessed/performed Overall Cognitive Status: Within Functional Limits for tasks assessed                                        Exercises General Exercises - Lower Extremity Hip ABduction/ADduction: Standing;Right;Strengthening;10 reps;AROM Straight Leg Raises: Standing;Strengthening;Right;10 reps;AROM(hip flexion and extension) Mini-Sqauts: Standing;Both;Strengthening;AROM;10 reps    General Comments        Pertinent Vitals/Pain Pain Assessment: No/denies pain    Home Living       Type of Home: Mobile home              Prior Function            PT Goals (current goals can now be found in the care plan section) Acute Rehab PT Goals Patient Stated Goal: get strong enough to return home independently PT Goal Formulation: With patient Time For Goal Achievement: 06/18/18 Potential to Achieve Goals: Good Progress towards PT goals: Progressing toward goals    Frequency    7X/week      PT Plan Current plan remains appropriate    Co-evaluation              AM-PAC PT "6 Clicks" Daily Activity  Outcome Measure  Difficulty turning over in bed (including adjusting bedclothes, sheets and blankets)?: None Difficulty moving from lying on back to sitting on the side of the bed? : None Difficulty sitting down on and standing up from a chair with arms (e.g., wheelchair, bedside commode, etc,.)?: A Little Help needed moving to and from a bed to chair (including a wheelchair)?: A Little Help needed walking in hospital room?: A Little Help needed climbing 3-5 steps with a railing? : A Lot 6 Click Score: 19    End of Session Equipment Utilized During Treatment: Gait  belt Activity Tolerance: Patient tolerated treatment well Patient left: in bed;with call bell/phone within reach;with family/visitor present(seated at bedside) Nurse Communication: Mobility status PT Visit Diagnosis: Unsteadiness on feet (R26.81);Difficulty in walking, not elsewhere classified (R26.2);Hemiplegia and hemiparesis;Other abnormalities of gait and mobility (R26.89);Muscle weakness (generalized) (M62.81) Hemiplegia - Right/Left: Right Hemiplegia - dominant/non-dominant: Dominant Hemiplegia - caused by: Cerebral infarction     Time: 1540-1615 PT Time Calculation (min) (ACUTE ONLY): 35 min  Charges:  $Gait Training: 8-22 mins $Therapeutic Exercise: 8-22 mins                     4:30 PM, 06/14/18 Lonell Grandchild, MPT Physical Therapist with [facility] 336 [phone] office [phone] mobile phone

## 2018-06-14 NOTE — Progress Notes (Signed)
Dr Tat paged via [place] to be made aware of MRI results.  Received call from Radiology at [facility] with results

## 2018-06-14 NOTE — Evaluation (Signed)
Speech Language Pathology Evaluation Patient Details Name: Judy Boyd MRN: [id number] DOB: Dec 12, 1960 Today's Date: 06/14/2018 Time: 2951-8841 SLP Time Calculation (min) (ACUTE ONLY): 19 min  Problem List:  Patient Active Problem List   Diagnosis Date Noted  . Acute ischemic stroke ([place]) 06/13/2018  . Tobacco abuse 06/13/2018  . Lacunar stroke ([place]) 06/12/2018  . Hypertension   . COPD (chronic obstructive pulmonary disease) ([place])   . GERD (gastroesophageal reflux disease) 04/06/2012  . Abdominal pain 04/06/2012  . Nausea & vomiting 04/06/2012  . Melena 04/06/2012   Past Medical History:  Past Medical History:  Diagnosis Date  . Anxiety   . Arthritis   . Asthma   . Bowel obstruction ([place])   . Chronic pain   . COPD (chronic obstructive pulmonary disease) ([place])   . Depression   . GERD (gastroesophageal reflux disease)   . Hypertension    Past Surgical History:  Past Surgical History:  Procedure Laterality Date  . ABDOMINAL SURGERY  2003   blockage  . left salpingectomy    . uterine ablation     HPI:  57 year old female with a history of hypertension, COPD, depression, and chronic pain presenting with 2-day history of facial droop and dysarthria that began on 06/10/2018.  The patient complained of right upper extremity and right lower extremity weakness that started around the same time.  Her daughter subsequently saw and spoke with the patient on the morning of 06/12/2018 and urged the patient to be evaluated.  The patient has not seen a primary care physician for over 3 months.  She has not been on any of her prescribed medications including aspirin or antihypertensive medications. 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 Unfortunately, the right Pcomm which is a fetal origin of the Right PCA origin to arise directly from  the aneurysm. Subacute lacunar infarct of the left thalamus, with underlying moderately advanced bilateral deep gray matter small vessel   Assessment / Plan / Recommendation Clinical Impression  Pt presents with dysarthric speech and mild cognitive impairment based on brief assessment. Pt demonstrates R facial droop and R labial and lingual weakness resulting in lack of articulatory precision; Speech was noted in conversation at 70-85% intelligible. Pt was unable to complete serial subtraction; numbers were accurately placed on the clock however she was unable to accurately draw the hands. Pt was somewhat frustrated and reported "the damage has been done, what is the point?" SLP provided education of the importance of Rehab while in the critical window of recovery. Pt was not agreeable to more in depth assessment but assume impairment in higher level cognitive functioning. Pt would benefit from continued ST for dysarthria and higher level cognitive functioning in order to return to prior level of independence at next level of care.    SLP Assessment  SLP Recommendation/Assessment: Patient needs continued Speech Lanaguage Pathology Services SLP Visit Diagnosis: Cognitive communication deficit (R41.841);Dysarthria and anarthria (R47.1)    Follow Up Recommendations  Outpatient SLP;Home health SLP    Frequency and Duration min 2x/week  2 weeks      SLP Evaluation Cognition  Overall Cognitive Status: Impaired/Different from baseline Arousal/Alertness: Awake/alert Orientation Level: Oriented X4 Awareness: Impaired Problem Solving: Impaired Problem Solving Impairment: Verbal basic Executive Function: Reasoning;Organizing Reasoning: Impaired Reasoning Impairment: Verbal basic Organizing: Impaired Organizing Impairment: Verbal basic Behaviors: Impulsive;Poor frustration tolerance Safety/Judgment: Impaired  Comprehension  Auditory Comprehension Overall Auditory Comprehension: Appears  within functional limits for tasks assessed Visual Recognition/Discrimination Discrimination: Not tested Reading Comprehension Reading Status: Not tested    Expression Expression Primary Mode of Expression: Verbal Verbal Expression Overall Verbal Expression: Appears within functional limits for tasks assessed   Oral / Motor  Motor Speech Overall Motor Speech: Impaired Respiration: Within functional limits Phonation: Normal Resonance: Within functional limits Articulation: Impaired Level of Impairment: Word Intelligibility: Intelligibility reduced Word: 75-100% accurate Phrase: 75-100% accurate Sentence: 75-100% accurate Conversation: 75-100% accurate Motor Planning: Witnin functional limits Motor Speech Errors: Not applicable   GO                    Judy Boyd 06/14/2018, 3:20 PM

## 2018-06-14 NOTE — Progress Notes (Signed)
PROGRESS NOTE  Judy Boyd MEQ:[id number] DOB: 04/13/1961 DOA: 06/12/2018 PCP: Patient, No Pcp Per  Brief History:  57 year old female with a history of hypertension, COPD, depression, and chronic pain presenting with 2-day history of facial droop and dysarthria that began on 06/10/2018.  The patient complained of right upper extremity and right lower extremity weakness that started around the same time.  Her daughter subsequently saw and spoke with the patient on the morning of 06/12/2018 and urged the patient to be evaluated.  The patient has not seen a primary care physician for over 3 months.  She has not been on any of her prescribed medications including aspirin or antihypertensive medications.  However, the patient was taking naproxen for her left wrist tendinitis.  The patient denies any fevers, chills, headache, chest pain, shortness breath, nausea, vomiting, diarrhea, abdominal pain, dysuria, hematuria.  Unfortunately, the patient has been smoking 1/2 pack/day.  In the emergency department, the patient was afebrile hemodynamically stable.  She did have a low-grade temperature of 99.1 F.  WBC is 12.4.  EKG shows sinus rhythm with nonspecific T wave change.  BMP was unremarkable.  Neurology was contacted by EDP, and they felt the patient could state any pain.  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 Kathyrn Sheriff  Essential hypertension -Allow for permissive hypertension -hydralazine prn SBP>220  Hyperlipidemia -start  lipitor 40 mg daily  COPD -Stable on room air  Odontogenic infection -Continue Augmentin -CT head and neck--revealed soft tissue inflammation over the right mandible with extensive dental disease and right submandibular lymphadenopathy.  Thyroid nodule -Incidental finding of right thyroid nodule -Outpatient follow-up -Check TSH, free T4  Tobacco abuse -NicoDerm patch -I have discussed tobacco cessation with the patient.  I have counseled the patient regarding the negative impacts of continued tobacco use including but not limited to lung cancer, COPD, and cardiovascular disease.  I have discussed alternatives to tobacco and modalities that may help facilitate tobacco cessation including but not limited to biofeedback, hypnosis, and medications.  Total time spent with tobacco counseling was 4 minutes.   Disposition Plan:   Home vs SNF 10/29 if stable Family Communication:  No Family at bedside  Consultants:  none  Code Status:  FULL   DVT Prophylaxis: Pettit Lovenox   Procedures: As Listed in Progress Note Above  Antibiotics: None      Subjective: Pt feeling stronger.  Feels that speech and right sided weakness are improving.  Denies cp, sob, n/v/d, abd pain.  No f/c, dysuria  Objective: Vitals:   06/14/18 0958 06/14/18 1223 06/14/18 1225 06/14/18 1313  BP:  (!) 191/121 (!) 200/114 (!) 175/98  Pulse:  79    Resp:  16    Temp:  98.3 F (36.8 C)    TempSrc:  Oral    SpO2: 97% 100%    Weight:      Height:        Intake/Output Summary (Last 24 hours) at 06/14/2018 1731 Last data filed at 06/14/2018 1418 Gross per 24 hour  Intake 720 ml  Output 500 ml  Net 220 ml   Weight change:  Exam:   General:  Pt is alert, follows commands appropriately, not in acute distress  HEENT: No icterus, No thrush, No neck mass, [place]/AT  Cardiovascular: RRR, S1/S2, no rubs, no gallops  Respiratory: CTA bilaterally, no wheezing, no crackles, no rhonchi  Abdomen:  Soft/+BS, non tender, non distended, no guarding  Extremities: No edema, No lymphangitis, No petechiae, No rashes, no synovitis   Data Reviewed: I have personally reviewed following labs and imaging studies Basic Metabolic Panel: Recent Labs  Lab 06/12/18 1451 06/12/18 1500  NA 139 141  K 3.9 3.9  CL 106 107  CO2 25  --   GLUCOSE 86 82  BUN 15 16  CREATININE 0.80 0.80  CALCIUM 9.4  --    Liver Function Tests: Recent Labs  Lab 06/12/18 1451  AST 35  ALT 17  ALKPHOS 92  BILITOT 1.0  PROT 7.8  ALBUMIN 4.0   No results for input(s): LIPASE, AMYLASE in the last 168 hours. No results for input(s): AMMONIA in the last 168 hours. Coagulation Profile: Recent Labs  Lab 06/12/18 1451  INR 0.92   CBC: Recent Labs  Lab 06/12/18 1451 06/12/18 1500  WBC 12.4*  --   NEUTROABS 6.8  --   HGB 14.2 15.3*  HCT 46.1* 45.0  MCV 90.2  --   PLT 351  --    Cardiac Enzymes: No results for input(s): CKTOTAL, CKMB, CKMBINDEX, TROPONINI in the last 168 hours. BNP: Invalid input(s): POCBNP CBG: Recent Labs  Lab 06/12/18 1500  GLUCAP 85   HbA1C: Recent Labs    06/13/18 0613  HGBA1C 4.6*   Urine analysis:    Component Value Date/Time   COLORURINE STRAW (A) 06/12/2018 1454   APPEARANCEUR CLEAR 06/12/2018 1454   LABSPEC 1.003 (L) 06/12/2018 1454   PHURINE 7.0 06/12/2018 1454   GLUCOSEU NEGATIVE 06/12/2018 1454   HGBUR NEGATIVE 06/12/2018 1454   BILIRUBINUR NEGATIVE 06/12/2018 1454   KETONESUR NEGATIVE 06/12/2018 1454   PROTEINUR NEGATIVE 06/12/2018 1454   UROBILINOGEN 0.2 04/06/2012 1430   NITRITE NEGATIVE 06/12/2018 1454   LEUKOCYTESUR NEGATIVE 06/12/2018 1454   Sepsis Labs: @LABRCNTIP (procalcitonin:4,lacticidven:4) )No results found for this or any previous visit (from the past 240 hour(s)).   Scheduled Meds: . amoxicillin-clavulanate  1 tablet Oral Q12H  . aspirin  300 mg Rectal Daily   Or  . aspirin  325 mg Oral Daily  . atorvastatin  40 mg Oral q1800  .  enoxaparin (LOVENOX) injection  40 mg Subcutaneous Q24H  . feeding supplement (ENSURE ENLIVE)  237 mL Oral BID BM  . mometasone-formoterol  2 puff Inhalation BID  . montelukast  10 mg Oral QHS   Continuous Infusions:  Procedures/Studies: Ct Head Wo Contrast  Result Date: 06/12/2018 CLINICAL DATA:  Mental status change, facial droop and slurred speech. EXAM: CT HEAD WITHOUT CONTRAST TECHNIQUE: Contiguous axial images were obtained from the base of the skull through the vertex without intravenous contrast. COMPARISON:  None. FINDINGS: Brain: Small ill-defined areas of low attenuation in the region of the left caudate head likely remote lacunar type infarcts particularly given the fact that there is slight ex vacuo dilatation of the frontal horn this area. No CT findings for hemispheric infarction or intracranial hemorrhage. No extra-axial fluid collections are identified. The gray-white differentiation is maintained. The brainstem and cerebellum are grossly normal. Vascular: No hyperdense vessel or unexpected calcification. Skull: No fracture or bone lesion. Sinuses/Orbits: The paranasal sinuses and mastoid air cells are clear. The globes are intact. Other: No  scalp lesions or hematoma. IMPRESSION: 1. Probable remote lacunar type infarcts in the left caudate area. 2. No CT findings for acute hemispheric infarction or intracranial hemorrhage. Electronically Signed   By: Marijo Sanes M.D.   On: 06/12/2018 15:56   Ct Angio Neck W Or Wo Contrast  Result Date: 06/13/2018 CLINICAL DATA:  57 y/o F; right-sided weakness starting 4 days ago. Unable to walk. Worsening symptoms. EXAM: CT ANGIOGRAPHY NECK TECHNIQUE: Multidetector CT imaging of the neck was performed using the standard protocol during bolus administration of intravenous contrast. Multiplanar CT image reconstructions and MIPs were obtained to evaluate the vascular anatomy. Carotid stenosis measurements (when applicable) are obtained utilizing  NASCET criteria, using the distal internal carotid diameter as the denominator. CONTRAST:  58mL ISOVUE-370 IOPAMIDOL (ISOVUE-370) INJECTION 76% COMPARISON:  None. FINDINGS: Aortic arch: Standard branching. Imaged portion shows no evidence of aneurysm or dissection. No significant stenosis of the major arch vessel origins. Mild calcific atherosclerosis. Right carotid system: No evidence of dissection, stenosis (50% or greater) or occlusion. Carotid bifurcation calcified plaque without stenosis. Left carotid system: No evidence of dissection, stenosis (50% or greater) or occlusion. Carotid bifurcation fibrofatty plaque without stenosis. Vertebral arteries: Codominant. No evidence of dissection, stenosis (50% or greater) or occlusion. Skeleton: Moderate cervical spondylosis with predominant discogenic degenerative changes at C4-C6 where there is mild reversal of cervical curvature. Other neck: Thyroid nodules measuring up to 19 mm in the right lobe. Inflammation of soft tissues overlying the right body of the mandible. Extensive dental disease with multiple periapical cysts and carries including the right posterior mandibular molar subjacent inflammation. No discrete abscess identified. Right submandibular lymphadenopathy, likely reactive. Upper chest: Mild-to-moderate centrilobular emphysema. IMPRESSION: 1. Patent carotid and vertebral arteries. No dissection, occlusion, aneurysm, or hemodynamically significant stenosis by NASCET criteria. 2. Soft tissue inflammation over the right body of mandible, probable odontogenic infection, no discrete soft tissue abscess. 3. Extensive dental disease with multiple periapical cysts and carries including the right posterior mandibular molars subjacent to soft tissue inflammation. 4. Right submandibular lymphadenopathy, likely reactive. 5. Thyroid nodules measuring up to 19 mm in the right lobe. Recommend further evaluation with thyroid ultrasound on a nonemergent basis. 6. Aortic  Atherosclerosis (ICD10-I70.0) and Emphysema (ICD10-J43.9). Electronically Signed   By: Kristine Garbe M.D.   On: 06/13/2018 02:41   Mr Brain Wo Contrast  Addendum Date: 06/14/2018   ADDENDUM REPORT: 06/14/2018 11:33 ADDENDUM: I spoke to the patient's Nurse Megan by phone at 1124 hours. And the study was discussed by telephone with Dr. Carles Collet on 06/14/2018 at 1130 hours. Electronically Signed   By: Genevie Ann M.D.   On: 06/14/2018 11:33   Result Date: 06/14/2018 CLINICAL DATA:  57 year old female with right facial droop and weakness for 3 days. EXAM: MRI HEAD WITHOUT CONTRAST MRA HEAD WITHOUT CONTRAST TECHNIQUE: Multiplanar, multiecho pulse sequences of the brain and surrounding structures were obtained without intravenous contrast. Angiographic images of the head were obtained using MRA technique without contrast. COMPARISON:  CTA neck yesterday and noncontrast head CT 06/12/2018. FINDINGS: MRI HEAD FINDINGS Brain: Densely restricted diffusion in the left pons in an area of 12-14 millimeters as seen on series 4, image 75. Associated T2 and FLAIR hyperintensity with mild pontine expansion, but no hemorrhage or significant regional mass effect. Asymmetric curvilinear signal abnormality on trace diffusion in the medial left thalamus seen on series 483, but ADC demonstrates little if any restriction. No restricted diffusion elsewhere. Chronic lacunar infarcts in the bilateral basal ganglia most pronounced at  the left caudate corresponding to the plain head CT finding on 06/12/2018. Small chronic bilateral thalamic lacune is. Patchy T2 and FLAIR hyperintensity in the right pons superimposed on the acute left pontine finding. No cortical encephalomalacia or chronic cerebral blood products identified. No midline shift, mass effect, evidence of mass lesion, ventriculomegaly, extra-axial collection or acute intracranial hemorrhage. Cervicomedullary junction and pituitary are within normal limits. Vascular: Major  intracranial vascular flow voids are preserved. Skull and upper cervical spine: Negative visible cervical spine. Visualized bone marrow signal is within normal limits. Sinuses/Orbits: Normal orbits soft tissues. Paranasal Visualized paranasal sinuses and mastoids are stable and well pneumatized. Other: Grossly normal visible internal auditory structures. Scalp and visible face soft tissues appear negative. MRA HEAD FINDINGS Antegrade flow in the posterior circulation with no distal vertebral artery stenosis. Mildly dominant distal left vertebral artery. Patent PICA origins and vertebrobasilar junction. Patent basilar artery without irregularity or stenosis. Normal SCA and left PCA origins. Fetal right PCA origin. Left posterior communicating artery diminutive or absent. Bilateral PCA branches are within normal limits. Antegrade flow in both ICA siphons. Mild siphon irregularity but no siphon stenosis. Arising at the right posterior communicating artery origin there is a lobulated inferiorly and slightly laterally and anteriorly directed aneurysm measuring 5 x 6 millimeters (series 8, image 54 and series 103, image 24). The right Pcomm seems to arise from the aneurysm as demonstrated on image 54. Otherwise normal carotid termini. The MCA and ACA origins are within normal limits. The right A1 is mildly dominant. The anterior communicating artery may be fenestrated. The ACA branches are tortuous but otherwise within normal limits. Left MCA M1 segment, left MCA bifurcation, and left MCA branches are within normal limits. The right M1 bifurcates early. The right MCA branches are within normal limits. IMPRESSION: 1. 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 2. 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 Unfortunately, the right Pcomm which is a fetal origin of the Right PCA  origin to arise directly from the aneurysm. Neuro-Interventional Radiology consultation is suggested to evaluate the appropriateness of potential treatment. Non-emergent evaluation can be arranged by calling [phone] during usual hours. Emergency evaluation can be requested by paging [phone]. 3. Subacute lacunar infarct of the left thalamus, with underlying moderately advanced bilateral deep gray matter small vessel ischemia. 4. Intracranial MRA is negative for intracranial stenosis or branch occlusion. Electronically Signed: By: Genevie Ann M.D. On: 06/14/2018 09:58   Mr Jodene Nam Head/brain ZS Cm  Addendum Date: 06/14/2018   ADDENDUM REPORT: 06/14/2018 11:33 ADDENDUM: I spoke to the patient's Nurse Megan by phone at 1124 hours. And the study was discussed by telephone with Dr. Carles Collet on 06/14/2018 at 1130 hours. Electronically Signed   By: Genevie Ann M.D.   On: 06/14/2018 11:33   Result Date: 06/14/2018 CLINICAL DATA:  57 year old female with right facial droop and weakness for 3 days. EXAM: MRI HEAD WITHOUT CONTRAST MRA HEAD WITHOUT CONTRAST TECHNIQUE: Multiplanar, multiecho pulse sequences of the brain and surrounding structures were obtained without intravenous contrast. Angiographic images of the head were obtained using MRA technique without contrast. COMPARISON:  CTA neck yesterday and noncontrast head CT 06/12/2018. FINDINGS: MRI HEAD FINDINGS Brain: Densely restricted diffusion in the left pons in an area of 12-14 millimeters as seen on series 4, image 75. Associated T2 and FLAIR hyperintensity with mild pontine expansion, but no hemorrhage or significant  regional mass effect. Asymmetric curvilinear signal abnormality on trace diffusion in the medial left thalamus seen on series 483, but ADC demonstrates little if any restriction. No restricted diffusion elsewhere. Chronic lacunar infarcts in the bilateral basal ganglia most pronounced at the left caudate corresponding to the plain head CT finding on  06/12/2018. Small chronic bilateral thalamic lacune is. Patchy T2 and FLAIR hyperintensity in the right pons superimposed on the acute left pontine finding. No cortical encephalomalacia or chronic cerebral blood products identified. No midline shift, mass effect, evidence of mass lesion, ventriculomegaly, extra-axial collection or acute intracranial hemorrhage. Cervicomedullary junction and pituitary are within normal limits. Vascular: Major intracranial vascular flow voids are preserved. Skull and upper cervical spine: Negative visible cervical spine. Visualized bone marrow signal is within normal limits. Sinuses/Orbits: Normal orbits soft tissues. Paranasal Visualized paranasal sinuses and mastoids are stable and well pneumatized. Other: Grossly normal visible internal auditory structures. Scalp and visible face soft tissues appear negative. MRA HEAD FINDINGS Antegrade flow in the posterior circulation with no distal vertebral artery stenosis. Mildly dominant distal left vertebral artery. Patent PICA origins and vertebrobasilar junction. Patent basilar artery without irregularity or stenosis. Normal SCA and left PCA origins. Fetal right PCA origin. Left posterior communicating artery diminutive or absent. Bilateral PCA branches are within normal limits. Antegrade flow in both ICA siphons. Mild siphon irregularity but no siphon stenosis. Arising at the right posterior communicating artery origin there is a lobulated inferiorly and slightly laterally and anteriorly directed aneurysm measuring 5 x 6 millimeters (series 8, image 54 and series 103, image 24). The right Pcomm seems to arise from the aneurysm as demonstrated on image 54. Otherwise normal carotid termini. The MCA and ACA origins are within normal limits. The right A1 is mildly dominant. The anterior communicating artery may be fenestrated. The ACA branches are tortuous but otherwise within normal limits. Left MCA M1 segment, left MCA bifurcation, and left  MCA branches are within normal limits. The right M1 bifurcates early. The right MCA branches are within normal limits. IMPRESSION: 1. 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 2. 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 Unfortunately, the right Pcomm which is a fetal origin of the Right PCA origin to arise directly from the aneurysm. Neuro-Interventional Radiology consultation is suggested to evaluate the appropriateness of potential treatment. Non-emergent evaluation can be arranged by calling [phone] during usual hours. Emergency evaluation can be requested by paging [phone]. 3. Subacute lacunar infarct of the left thalamus, with underlying moderately advanced bilateral deep gray matter small vessel ischemia. 4. Intracranial MRA is negative for intracranial stenosis or branch occlusion. Electronically Signed: By: Genevie Ann M.D. On: 06/14/2018 09:58    Orson Eva, DO  [facility] Pager [phone]  If 7PM-7AM, please contact night-coverage [email or web address] Password [email or web address] 06/14/2018, 5:31 PM   LOS: 2 days

## 2018-06-15 ENCOUNTER — Inpatient Hospital Stay (HOSPITAL_COMMUNITY): Payer: Medicaid Other

## 2018-06-15 LAB — CBC
HCT: 41.5 % (ref 36.0–46.0)
HEMOGLOBIN: 12.5 g/dL (ref 12.0–15.0)
MCH: 27.9 pg (ref 26.0–34.0)
MCHC: 30.1 g/dL (ref 30.0–36.0)
MCV: 92.6 fL (ref 80.0–100.0)
Platelets: 308 10*3/uL (ref 150–400)
RBC: 4.48 MIL/uL (ref 3.87–5.11)
RDW: 12.6 % (ref 11.5–15.5)
WBC: 11.6 10*3/uL — ABNORMAL HIGH (ref 4.0–10.5)
nRBC: 0 % (ref 0.0–0.2)

## 2018-06-15 LAB — BASIC METABOLIC PANEL
Anion gap: 6 (ref 5–15)
BUN: 17 mg/dL (ref 6–20)
CHLORIDE: 108 mmol/L (ref 98–111)
CO2: 26 mmol/L (ref 22–32)
CREATININE: 0.71 mg/dL (ref 0.44–1.00)
Calcium: 9.4 mg/dL (ref 8.9–10.3)
GFR calc Af Amer: >60 mL/min (ref >60)
GLUCOSE: 95 mg/dL (ref 70–99)
POTASSIUM: 3.8 mmol/L (ref 3.5–5.1)
Sodium: 140 mmol/L (ref 135–145)

## 2018-06-15 MED ORDER — SODIUM CHLORIDE 0.9 % IV BOLUS
500.0000 mL | Freq: Once | INTRAVENOUS | Status: AC
  Administered 2018-06-15: 500 mL via INTRAVENOUS

## 2018-06-15 NOTE — Progress Notes (Signed)
Nutrition Brief Note  Patient identified on the Malnutrition Screening Tool (MST) Report  Patient presents with acute ischemic stroke. ST assessed secondary to dysarthria and mild cognitive impairment. Patient reporting RUE deficit. CIR; SNF recommended. Transfer in process for [facility].   Patient advanced to regular diet. Patient is able to feed herself and denies changes in appetite. Meal intake 75-100% most meals.  Weight loss of 5 lb (2.2 kg) in 3 months is not significant for time frame.   Body mass index is 19.37 kg/m. Patient meets criteria for normal range based on current BMI.   Medications reviewed and include: Ensure Enlive, Lipitor.   Labs: BMP Latest Ref Rng & Units 06/15/2018 06/12/2018 06/12/2018  Glucose 70 - 99 mg/dL 95 82 86  BUN 6 - 20 mg/dL 17 16 15   Creatinine 0.44 - 1.00 mg/dL 0.71 0.80 0.80  Sodium 135 - 145 mmol/L 140 141 139  Potassium 3.5 - 5.1 mmol/L 3.8 3.9 3.9  Chloride 98 - 111 mmol/L 108 107 106  CO2 22 - 32 mmol/L 26 - 25  Calcium 8.9 - 10.3 mg/dL 9.4 - 9.4     No nutrition interventions warranted at this time. If nutrition issues arise, please consult RD.   Colman Cater MS,RD,CSG,LDN Office: [phone] Pager: [phone]

## 2018-06-15 NOTE — Progress Notes (Signed)
Physical Therapy Treatment Patient Details Name: Judy Boyd MRN: [id number] DOB: 1961/07/10 Today's Date: 06/15/2018    History of Present Illness 57 year old female with a history of hypertension, COPD, depression, and chronic pain presenting with 2-day history of facial droop and dysarthria that began on 06/10/2018.  The patient complained of right upper extremity and right lower extremity weakness that started around the same time.  Her daughter subsequently saw and spoke with the patient on the morning of 06/12/2018 and urged the patient to be evaluated.  The patient has not seen a primary care physician for over 3 months.  She has not been on any of her prescribed medications including aspirin or antihypertensive medications.  However, the patient was taking naproxen for her left wrist tendinitis.  The patient denies any fevers, chills, headache, chest pain, shortness breath, nausea, vomiting, diarrhea, abdominal pain, dysuria, hematuria.  Unfortunately, the patient has been smoking 1/2 pack/day.  In the emergency department, the patient was afebrile hemodynamically stable.  She did have a low-grade temperature of 99.1 F.  WBC is 12.4.  EKG shows sinus rhythm with nonspecific T wave change.  BMP was unremarkable.  Neurology was contacted by EDP, and they felt the patient could state any pain.    PT Comments    Patient presents significantly weaker on the right side today, unable to maintain standing balance with frequent falling over the right when attempting to ambulate with SPC, poor sequence demonstrated when attempting step to gait pattern using cane, able to take a few steps, but requires a lot of hands on assistance to avoid falling.  Patient has no right right ankle dorsiflexion and can only extend right knee through approximately 10 degrees of ROM against gravity.  Patient tolerated sitting up at bedside with family members present in room after therapy.  Patient will benefit from  continued physical therapy in hospital and recommended venue below to increase strength, balance, endurance for safe ADLs and gait.    Follow Up Recommendations  CIR;SNF     Equipment Recommendations  Other (comment)(to be determined)    Recommendations for Other Services Rehab consult     Precautions / Restrictions Precautions Precautions: Fall Precaution Comments: right side hemiparesis Restrictions Weight Bearing Restrictions: No    Mobility  Bed Mobility Overal bed mobility: Modified Independent             General bed mobility comments: very slow labored movement having to use LUE to move right arm and leg  Transfers Overall transfer level: Needs assistance Equipment used: Straight cane;1 person hand held assist Transfers: Sit to/from [facility] Sit to Stand: Min assist Stand pivot transfers: Mod assist       General transfer comment: frequent buckling of RLE, requires hands on assist to maintain standing balance  Ambulation/Gait Ambulation/Gait assistance: Max assist Gait Distance (Feet): 8 Feet Assistive device: 1 person hand held assist;Straight cane Gait Pattern/deviations: Step-to pattern;Decreased step length - right;Decreased stance time - right;Decreased stride length Gait velocity: slow   General Gait Details: limited to 7-8 steps at bedside with frequent buckling of RLE, requires tactile assist to advance RLE due to weakness, frequent falling over to the right, poor right hand grip strength for using RW   Stairs             Wheelchair Mobility    Modified Rankin (Stroke Patients Only)       Balance Overall balance assessment: Needs assistance Sitting-balance support: Feet supported;No upper extremity supported Sitting balance-Leahy  Scale: Good     Standing balance support: During functional activity;Single extremity supported Standing balance-Leahy Scale: Poor Standing balance comment: poor using SPC                             Cognition Arousal/Alertness: Awake/alert Behavior During Therapy: WFL for tasks assessed/performed Overall Cognitive Status: Within Functional Limits for tasks assessed                                        Exercises General Exercises - Lower Extremity Ankle Circles/Pumps: Seated;PROM;Right;10 reps;Strengthening Long Arc Quad: Seated;AAROM;Strengthening;Right;10 reps Hip Flexion/Marching: Seated;AAROM;Strengthening;Right;10 reps    General Comments        Pertinent Vitals/Pain Pain Assessment: No/denies pain    Home Living                      Prior Function            PT Goals (current goals can now be found in the care plan section) Acute Rehab PT Goals Patient Stated Goal: get strong enough to return home independently PT Goal Formulation: With patient/family Time For Goal Achievement: 06/18/18 Potential to Achieve Goals: Good Progress towards PT goals: Progressing toward goals    Frequency    7X/week      PT Plan Current plan remains appropriate    Co-evaluation              AM-PAC PT "6 Clicks" Daily Activity  Outcome Measure  Difficulty turning over in bed (including adjusting bedclothes, sheets and blankets)?: A Lot Difficulty moving from lying on back to sitting on the side of the bed? : A Lot Difficulty sitting down on and standing up from a chair with arms (e.g., wheelchair, bedside commode, etc,.)?: Unable Help needed moving to and from a bed to chair (including a wheelchair)?: A Lot Help needed walking in hospital room?: A Lot Help needed climbing 3-5 steps with a railing? : Total 6 Click Score: 10    End of Session Equipment Utilized During Treatment: Gait belt Activity Tolerance: Patient tolerated treatment well;Patient limited by fatigue Patient left: in bed;with call bell/phone within reach;with family/visitor present(seated at bedside) Nurse Communication: Mobility status PT  Visit Diagnosis: Unsteadiness on feet (R26.81);Difficulty in walking, not elsewhere classified (R26.2);Hemiplegia and hemiparesis;Other abnormalities of gait and mobility (R26.89);Muscle weakness (generalized) (M62.81) Hemiplegia - Right/Left: Right Hemiplegia - dominant/non-dominant: Dominant Hemiplegia - caused by: Cerebral infarction     Time: 4650-3546 PT Time Calculation (min) (ACUTE ONLY): 26 min  Charges:  $Therapeutic Exercise: 8-22 mins $Therapeutic Activity: 8-22 mins                     3:47 PM, 06/15/18 Lonell Grandchild, MPT Physical Therapist with [facility] [phone] office [phone] mobile phone

## 2018-06-15 NOTE — Evaluation (Addendum)
Occupational Therapy Treatment Patient Details Name: Judy Boyd MRN: [id number] DOB: 1960-08-31 Today's Date: 06/15/2018    History of Present Illness 57 year old female with a history of hypertension, COPD, depression, and chronic pain presenting with 2-day history of facial droop and dysarthria that began on 06/10/2018.  The patient complained of right upper extremity and right lower extremity weakness that started around the same time.  Her daughter subsequently saw and spoke with the patient on the morning of 06/12/2018 and urged the patient to be evaluated.  The patient has not seen a primary care physician for over 3 months.  She has not been on any of her prescribed medications including aspirin or antihypertensive medications.  However, the patient was taking naproxen for her left wrist tendinitis.  The patient denies any fevers, chills, headache, chest pain, shortness breath, nausea, vomiting, diarrhea, abdominal pain, dysuria, hematuria.  Unfortunately, the patient has been smoking 1/2 pack/day.  In the emergency department, the patient was afebrile hemodynamically stable.  She did have a low-grade temperature of 99.1 F.  WBC is 12.4.  EKG shows sinus rhythm with nonspecific T wave change.  BMP was unremarkable.  Neurology was contacted by EDP, and they felt the patient could state any pain.   Clinical Impression   Pt with seemingly significant change in RUE function since evaluation yesterday. Per chart review pt with RUE strength at 4-/5 and was able to use RUE to assist with ADL completion. Today pt reportedly unable to use RUE during ADLs, requiring OT to assist with dressing and grooming tasks. Pt completing tasks in sitting due to RUE buckling and pt not feeling safe attempting at sink. Pt does not have 24/7 assistance at home and is unsafe to return home alone at this time. Pt verbalized understanding, however is hesitant to any rehab facility. Continue to recommend CIR vs SNF on  discharge.     Follow Up Recommendations  CIR;SNF;Supervision/Assistance - 24 hour    Equipment Recommendations  None recommended by OT       Precautions / Restrictions Precautions Precautions: Fall Precaution Comments: right side hemiparesis      Mobility Bed Mobility Overal bed mobility: Modified Independent             General bed mobility comments: HOB elevated, increased time with limited use of RUE  Transfers Overall transfer level: Needs assistance Equipment used: Rolling walker (2 wheeled) Transfers: Sit to/from [facility] Sit to Stand: Min assist Stand pivot transfers: Min guard       General transfer comment: buckling of RLE; OT placed RUE on walker, pt able to grip lightly         ADL either performed or assessed with clinical judgement   ADL Overall ADL's : Needs assistance/impaired     Grooming: Wash/dry face;Oral care;Set up;Sitting Grooming Details (indicate cue type and reason): Pt requiring assistance to take top off and put on toothpaste, seemingly unable to use right hand to assist, OT assisting for lateral pinch to hold toothpast. Pt using soley left hand for tasks.              Lower Body Dressing: Moderate assistance;Sitting/lateral leans Lower Body Dressing Details (indicate cue type and reason): pt able to doff socks with left hand, unable to use RUE to assist to donning gripper socks. OT assisting with donning socks               General ADL Comments: Pt unable to perform tasks in standing due  to RLE buckling, not using RUE during session                  Pertinent Vitals/Pain Pain Assessment: No/denies pain     Hand Dominance     Extremity/Trunk Assessment Upper Extremity Assessment Upper Extremity Assessment: RUE deficits/detail RUE Deficits / Details: Today pt presents with RUE strength 1/5, poor grip strength and coordination. Pt able to shrug shoulders, trapezius strength 2+/5 RUE  Coordination: decreased fine motor;decreased gross motor   Lower Extremity Assessment Lower Extremity Assessment: Defer to PT evaluation       Communication     Cognition Arousal/Alertness: Awake/alert Behavior During Therapy: WFL for tasks assessed/performed Overall Cognitive Status: Within Functional Limits for tasks assessed                                        Exercises Exercises: General Upper Extremity General Exercises - Upper Extremity Shoulder Flexion: Self ROM;10 reps;Seated Shoulder Horizontal ABduction: Self ROM;10 reps Elbow Flexion: Self ROM;10 reps Elbow Extension: Self ROM;10 reps                         OT Goals(Current goals can be found in the care plan section) Acute Rehab OT Goals Patient Stated Goal: get strong enough to return home independently ADL Goals Pt Will Perform Eating: with modified independence;sitting Pt Will Perform Grooming: with modified independence;standing Pt Will Perform Upper Body Bathing: with modified independence;sitting;standing Pt Will Perform Lower Body Bathing: with modified independence;sit to/from stand Pt Will Perform Lower Body Dressing: with modified independence;sitting/lateral leans;sit to/from stand Pt Will Transfer to Toilet: with modified independence;ambulating;regular height toilet;bedside commode;grab bars Pt Will Perform Toileting - Clothing Manipulation and hygiene: with modified independence;sitting/lateral leans;sit to/from stand Pt/caregiver will Perform Home Exercise Program: Increased strength;Right Upper extremity;With theraputty;Independently;With written HEP provided  OT Frequency: Min 2X/week    End of Session Equipment Utilized During Treatment: Gait belt;Rolling walker Nurse Communication: Mobility status  Activity Tolerance: Patient tolerated treatment well Patient left: in chair;with call bell/phone within reach;with chair alarm set  OT Visit Diagnosis: Muscle weakness  (generalized) (M62.81);Hemiplegia and hemiparesis Hemiplegia - Right/Left: Right Hemiplegia - dominant/non-dominant: Dominant Hemiplegia - caused by: Cerebral infarction                Time: 7681-1572 OT Time Calculation (min): 35 min Charges:  OT General Charges $OT Visit: 1 Visit OT Treatments $Self Care/Home Management : 8-22 mins $Therapeutic Exercise: 8-22 mins   Guadelupe Sabin, OTR/L  [phone] 06/15/2018, 10:00 AM

## 2018-06-15 NOTE — Progress Notes (Addendum)
PROGRESS NOTE  Judy Boyd JOA:[id number] DOB: 1961-04-25 DOA: 06/12/2018 PCP: Patient, No Pcp Per  Brief History:  57 year old female with a history of hypertension, COPD, depression, and chronic pain presenting with 2-day history of facial droop and dysarthria that began on 06/10/2018. The patient complained of right upper extremity and right lower extremity weakness that started around the same time. Her daughter subsequently saw and spoke with the patient on the morning of 06/12/2018 and urged the patient to be evaluated. The patient has not seen a primary care physician for over 3 months. She has not been on any of her prescribed medications including aspirin or antihypertensive medications. However, the patient was taking naproxen for her left wrist tendinitis. The patient denies any fevers, chills, headache, chest pain, shortness breath, nausea, vomiting, diarrhea, abdominal pain, dysuria, hematuria. Unfortunately, the patient has been smoking 1/2 pack/day. In the emergency department, the patient was afebrile hemodynamically stable. She did have a low-grade temperature of 99.1 F. WBC is 12.4. EKG shows sinus rhythm with nonspecific T wave change. BMP was unremarkable. Neurology was contacted by EDP, and they felt the patient could stay at [facility].  The patient underwent a full stroke work-up as discussed below.  However, on the morning of 06/15/2018, the patient was noted to have worsening right hemiparesis.  The case was discussed with neurology.  The patient will be transferred to [facility] for further evaluation.  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 Zada Finders) about aneurysm-->can follow up as oupt with Dr. Kathyrn Sheriff  Essential hypertension -Allow for permissive hypertension -hydralazine prn SBP>220  Hyperlipidemia -started lipitor 40 mg daily  COPD -Stable on room air  Odontogenic infection -Continue Augmentin -CT head and neck--revealed soft tissue inflammation over the right mandible with extensive dental disease and right submandibular lymphadenopathy.  Thyroid nodule -Incidental finding of right thyroid nodule -Outpatient follow-up -Check TSH, free T4  Tobacco abuse -NicoDerm patch -I have discussed tobacco cessation with the patient. I have counseled the patient regarding the negative impacts of continued tobacco use including but not limited to lung cancer, COPD, and cardiovascular disease. I have discussed alternatives to tobacco and modalities that may help facilitate tobacco cessation including but not limited to biofeedback, hypnosis, and medications. Total time spent with tobacco counseling was 4 minutes.   Disposition Plan: Transfer to [place] Communication:Daughter updated on 06/15/18--Total time spent 35 minutes.  Greater than 50% spent face to face counseling and coordinating care.   Consultants:none  Code Status: FULL   DVT Prophylaxis: [place] Lovenox   Procedures: As Listed in Progress Note Above  Antibiotics: None        Subjective: Pt complains of worsen right-sided weakness that began around 4 PM on 06/06/2018, but she did not tell any of the nursing staff.  Patient denies  any fevers, chills, chest pain, shortness breath, nausea, vomiting, diarrhea, abdominal pain.  There is no dysuria hematuria.  Is no rashes.  Objective: Vitals:   06/15/18 0020 06/15/18 0420 06/15/18 0750 06/15/18 0830  BP: (!) 142/85 132/78  138/86  Pulse: 78 80  83  Resp: 18 20  18   Temp: 98.5 F (36.9 C) 98.5 F (36.9 C)  98.3 F (36.8 C)  TempSrc: Oral Oral  Oral  SpO2: 99% 98% 96% 96%  Weight:      Height:        Intake/Output Summary (Last 24 hours) at 06/15/2018 1103 Last data filed at 06/15/2018 0900 Gross per 24 hour  Intake 480 ml  Output 800 ml  Net -320 ml   Weight change:  Exam:   General:  Pt is alert, follows commands appropriately, not in acute distress  HEENT: No icterus, No thrush, No neck mass, [place]/AT  Cardiovascular: RRR, S1/S2, no rubs, no gallops  Respiratory: Diminished breath sounds, but CTA bilaterally, no wheezing, no crackles, no rhonchi  Abdomen: Soft/+BS, non tender, non distended, no guarding  Extremities: No edema, No lymphangitis, No petechiae, No rashes, no synovitis   Data Reviewed: I have personally reviewed following labs and imaging studies Basic Metabolic Panel: Recent Labs  Lab 06/12/18 1451 06/12/18 1500  NA 139 141  K 3.9 3.9  CL 106 107  CO2 25  --   GLUCOSE 86 82  BUN 15 16  CREATININE 0.80 0.80  CALCIUM 9.4  --    Liver Function Tests: Recent Labs  Lab 06/12/18 1451  AST 35  ALT 17  ALKPHOS 92  BILITOT 1.0  PROT 7.8  ALBUMIN 4.0   No results for input(s): LIPASE, AMYLASE in the last 168 hours. No results for input(s): AMMONIA in the last 168 hours. Coagulation Profile: Recent Labs  Lab 06/12/18 1451  INR 0.92   CBC: Recent Labs  Lab 06/12/18 1451 06/12/18 1500  WBC 12.4*  --   NEUTROABS 6.8  --   HGB 14.2 15.3*  HCT 46.1* 45.0  MCV 90.2  --   PLT 351  --    Cardiac Enzymes: No results for input(s): CKTOTAL, CKMB, CKMBINDEX, TROPONINI in the last 168 hours. BNP: Invalid input(s):  POCBNP CBG: Recent Labs  Lab 06/12/18 1500  GLUCAP 85   HbA1C: Recent Labs    06/13/18 0613  HGBA1C 4.6*   Urine analysis:    Component Value Date/Time   COLORURINE STRAW (A) 06/12/2018 1454   APPEARANCEUR CLEAR 06/12/2018 1454   LABSPEC 1.003 (L) 06/12/2018 1454   PHURINE 7.0 06/12/2018 1454   GLUCOSEU NEGATIVE 06/12/2018 1454   HGBUR NEGATIVE 06/12/2018 1454   BILIRUBINUR NEGATIVE 06/12/2018 1454   KETONESUR NEGATIVE 06/12/2018 1454   PROTEINUR NEGATIVE 06/12/2018 1454   UROBILINOGEN 0.2 04/06/2012 1430   NITRITE NEGATIVE 06/12/2018 1454   LEUKOCYTESUR NEGATIVE 06/12/2018 1454   Sepsis Labs: @LABRCNTIP (procalcitonin:4,lacticidven:4) )No results found for this or any previous visit (from the past 240 hour(s)).   Scheduled Meds: . amoxicillin-clavulanate  1 tablet Oral Q12H  . aspirin  300 mg Rectal Daily   Or  . aspirin  325 mg Oral Daily  . atorvastatin  40 mg Oral q1800  . enoxaparin (LOVENOX) injection  40 mg Subcutaneous Q24H  . feeding supplement (ENSURE ENLIVE)  237 mL Oral BID BM  . mometasone-formoterol  2 puff Inhalation BID  . montelukast  10 mg Oral QHS   Continuous Infusions: . sodium chloride      Procedures/Studies: Ct Head Wo Contrast  Result Date: 06/12/2018 CLINICAL DATA:  Mental status change, facial droop and  slurred speech. EXAM: CT HEAD WITHOUT CONTRAST TECHNIQUE: Contiguous axial images were obtained from the base of the skull through the vertex without intravenous contrast. COMPARISON:  None. FINDINGS: Brain: Small ill-defined areas of low attenuation in the region of the left caudate head likely remote lacunar type infarcts particularly given the fact that there is slight ex vacuo dilatation of the frontal horn this area. No CT findings for hemispheric infarction or intracranial hemorrhage. No extra-axial fluid collections are identified. The gray-white differentiation is maintained. The brainstem and cerebellum are grossly normal.  Vascular: No hyperdense vessel or unexpected calcification. Skull: No fracture or bone lesion. Sinuses/Orbits: The paranasal sinuses and mastoid air cells are clear. The globes are intact. Other: No scalp lesions or hematoma. IMPRESSION: 1. Probable remote lacunar type infarcts in the left caudate area. 2. No CT findings for acute hemispheric infarction or intracranial hemorrhage. Electronically Signed   By: Marijo Sanes M.D.   On: 06/12/2018 15:56   Ct Angio Neck W Or Wo Contrast  Result Date: 06/13/2018 CLINICAL DATA:  57 y/o F; right-sided weakness starting 4 days ago. Unable to walk. Worsening symptoms. EXAM: CT ANGIOGRAPHY NECK TECHNIQUE: Multidetector CT imaging of the neck was performed using the standard protocol during bolus administration of intravenous contrast. Multiplanar CT image reconstructions and MIPs were obtained to evaluate the vascular anatomy. Carotid stenosis measurements (when applicable) are obtained utilizing NASCET criteria, using the distal internal carotid diameter as the denominator. CONTRAST:  30mL ISOVUE-370 IOPAMIDOL (ISOVUE-370) INJECTION 76% COMPARISON:  None. FINDINGS: Aortic arch: Standard branching. Imaged portion shows no evidence of aneurysm or dissection. No significant stenosis of the major arch vessel origins. Mild calcific atherosclerosis. Right carotid system: No evidence of dissection, stenosis (50% or greater) or occlusion. Carotid bifurcation calcified plaque without stenosis. Left carotid system: No evidence of dissection, stenosis (50% or greater) or occlusion. Carotid bifurcation fibrofatty plaque without stenosis. Vertebral arteries: Codominant. No evidence of dissection, stenosis (50% or greater) or occlusion. Skeleton: Moderate cervical spondylosis with predominant discogenic degenerative changes at C4-C6 where there is mild reversal of cervical curvature. Other neck: Thyroid nodules measuring up to 19 mm in the right lobe. Inflammation of soft tissues  overlying the right body of the mandible. Extensive dental disease with multiple periapical cysts and carries including the right posterior mandibular molar subjacent inflammation. No discrete abscess identified. Right submandibular lymphadenopathy, likely reactive. Upper chest: Mild-to-moderate centrilobular emphysema. IMPRESSION: 1. Patent carotid and vertebral arteries. No dissection, occlusion, aneurysm, or hemodynamically significant stenosis by NASCET criteria. 2. Soft tissue inflammation over the right body of mandible, probable odontogenic infection, no discrete soft tissue abscess. 3. Extensive dental disease with multiple periapical cysts and carries including the right posterior mandibular molars subjacent to soft tissue inflammation. 4. Right submandibular lymphadenopathy, likely reactive. 5. Thyroid nodules measuring up to 19 mm in the right lobe. Recommend further evaluation with thyroid ultrasound on a nonemergent basis. 6. Aortic Atherosclerosis (ICD10-I70.0) and Emphysema (ICD10-J43.9). Electronically Signed   By: Kristine Garbe M.D.   On: 06/13/2018 02:41   Mr Brain Wo Contrast  Addendum Date: 06/14/2018   ADDENDUM REPORT: 06/14/2018 11:33 ADDENDUM: I spoke to the patient's Nurse Megan by phone at 1124 hours. And the study was discussed by telephone with Dr. Carles Collet on 06/14/2018 at 1130 hours. Electronically Signed   By: Genevie Ann M.D.   On: 06/14/2018 11:33   Result Date: 06/14/2018 CLINICAL DATA:  57 year old female with right facial droop and weakness for 3 days. EXAM: MRI HEAD WITHOUT CONTRAST  MRA HEAD WITHOUT CONTRAST TECHNIQUE: Multiplanar, multiecho pulse sequences of the brain and surrounding structures were obtained without intravenous contrast. Angiographic images of the head were obtained using MRA technique without contrast. COMPARISON:  CTA neck yesterday and noncontrast head CT 06/12/2018. FINDINGS: MRI HEAD FINDINGS Brain: Densely restricted diffusion in the left pons in  an area of 12-14 millimeters as seen on series 4, image 75. Associated T2 and FLAIR hyperintensity with mild pontine expansion, but no hemorrhage or significant regional mass effect. Asymmetric curvilinear signal abnormality on trace diffusion in the medial left thalamus seen on series 483, but ADC demonstrates little if any restriction. No restricted diffusion elsewhere. Chronic lacunar infarcts in the bilateral basal ganglia most pronounced at the left caudate corresponding to the plain head CT finding on 06/12/2018. Small chronic bilateral thalamic lacune is. Patchy T2 and FLAIR hyperintensity in the right pons superimposed on the acute left pontine finding. No cortical encephalomalacia or chronic cerebral blood products identified. No midline shift, mass effect, evidence of mass lesion, ventriculomegaly, extra-axial collection or acute intracranial hemorrhage. Cervicomedullary junction and pituitary are within normal limits. Vascular: Major intracranial vascular flow voids are preserved. Skull and upper cervical spine: Negative visible cervical spine. Visualized bone marrow signal is within normal limits. Sinuses/Orbits: Normal orbits soft tissues. Paranasal Visualized paranasal sinuses and mastoids are stable and well pneumatized. Other: Grossly normal visible internal auditory structures. Scalp and visible face soft tissues appear negative. MRA HEAD FINDINGS Antegrade flow in the posterior circulation with no distal vertebral artery stenosis. Mildly dominant distal left vertebral artery. Patent PICA origins and vertebrobasilar junction. Patent basilar artery without irregularity or stenosis. Normal SCA and left PCA origins. Fetal right PCA origin. Left posterior communicating artery diminutive or absent. Bilateral PCA branches are within normal limits. Antegrade flow in both ICA siphons. Mild siphon irregularity but no siphon stenosis. Arising at the right posterior communicating artery origin there is a  lobulated inferiorly and slightly laterally and anteriorly directed aneurysm measuring 5 x 6 millimeters (series 8, image 54 and series 103, image 24). The right Pcomm seems to arise from the aneurysm as demonstrated on image 54. Otherwise normal carotid termini. The MCA and ACA origins are within normal limits. The right A1 is mildly dominant. The anterior communicating artery may be fenestrated. The ACA branches are tortuous but otherwise within normal limits. Left MCA M1 segment, left MCA bifurcation, and left MCA branches are within normal limits. The right M1 bifurcates early. The right MCA branches are within normal limits. IMPRESSION: 1. 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 2. 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 Unfortunately, the right Pcomm which is a fetal origin of the Right PCA origin to arise directly from the aneurysm. Neuro-Interventional Radiology consultation is suggested to evaluate the appropriateness of potential treatment. Non-emergent evaluation can be arranged by calling [phone] during usual hours. Emergency evaluation can be requested by paging [phone]. 3. Subacute lacunar infarct of the left thalamus, with underlying moderately advanced bilateral deep gray matter small vessel ischemia. 4. Intracranial MRA is negative for intracranial stenosis or branch occlusion. Electronically Signed: By: Genevie Ann M.D. On: 06/14/2018 09:58   Mr Jodene Nam Head/brain ZC Cm  Addendum Date: 06/14/2018   ADDENDUM REPORT: 06/14/2018 11:33 ADDENDUM: I spoke to the patient's Nurse Megan by phone at 1124 hours. And the study was discussed by telephone with Dr. Carles Collet on 06/14/2018  at 1130 hours. Electronically Signed   By: Genevie Ann M.D.   On: 06/14/2018 11:33   Result Date: 06/14/2018 CLINICAL DATA:  57 year old female with right facial droop and  weakness for 3 days. EXAM: MRI HEAD WITHOUT CONTRAST MRA HEAD WITHOUT CONTRAST TECHNIQUE: Multiplanar, multiecho pulse sequences of the brain and surrounding structures were obtained without intravenous contrast. Angiographic images of the head were obtained using MRA technique without contrast. COMPARISON:  CTA neck yesterday and noncontrast head CT 06/12/2018. FINDINGS: MRI HEAD FINDINGS Brain: Densely restricted diffusion in the left pons in an area of 12-14 millimeters as seen on series 4, image 75. Associated T2 and FLAIR hyperintensity with mild pontine expansion, but no hemorrhage or significant regional mass effect. Asymmetric curvilinear signal abnormality on trace diffusion in the medial left thalamus seen on series 483, but ADC demonstrates little if any restriction. No restricted diffusion elsewhere. Chronic lacunar infarcts in the bilateral basal ganglia most pronounced at the left caudate corresponding to the plain head CT finding on 06/12/2018. Small chronic bilateral thalamic lacune is. Patchy T2 and FLAIR hyperintensity in the right pons superimposed on the acute left pontine finding. No cortical encephalomalacia or chronic cerebral blood products identified. No midline shift, mass effect, evidence of mass lesion, ventriculomegaly, extra-axial collection or acute intracranial hemorrhage. Cervicomedullary junction and pituitary are within normal limits. Vascular: Major intracranial vascular flow voids are preserved. Skull and upper cervical spine: Negative visible cervical spine. Visualized bone marrow signal is within normal limits. Sinuses/Orbits: Normal orbits soft tissues. Paranasal Visualized paranasal sinuses and mastoids are stable and well pneumatized. Other: Grossly normal visible internal auditory structures. Scalp and visible face soft tissues appear negative. MRA HEAD FINDINGS Antegrade flow in the posterior circulation with no distal vertebral artery stenosis. Mildly dominant distal left  vertebral artery. Patent PICA origins and vertebrobasilar junction. Patent basilar artery without irregularity or stenosis. Normal SCA and left PCA origins. Fetal right PCA origin. Left posterior communicating artery diminutive or absent. Bilateral PCA branches are within normal limits. Antegrade flow in both ICA siphons. Mild siphon irregularity but no siphon stenosis. Arising at the right posterior communicating artery origin there is a lobulated inferiorly and slightly laterally and anteriorly directed aneurysm measuring 5 x 6 millimeters (series 8, image 54 and series 103, image 24). The right Pcomm seems to arise from the aneurysm as demonstrated on image 54. Otherwise normal carotid termini. The MCA and ACA origins are within normal limits. The right A1 is mildly dominant. The anterior communicating artery may be fenestrated. The ACA branches are tortuous but otherwise within normal limits. Left MCA M1 segment, left MCA bifurcation, and left MCA branches are within normal limits. The right M1 bifurcates early. The right MCA branches are within normal limits. IMPRESSION: 1. 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 2. 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 Unfortunately, the right Pcomm which is a fetal origin of the Right PCA origin to arise directly from the aneurysm. Neuro-Interventional Radiology consultation is suggested to evaluate the appropriateness of potential treatment. Non-emergent evaluation can be arranged by calling [phone] during usual hours. Emergency evaluation can be requested by paging [phone]. 3. Subacute lacunar infarct of the left thalamus, with underlying moderately advanced bilateral deep gray matter small vessel ischemia. 4. Intracranial MRA is negative for intracranial stenosis or branch occlusion. Electronically Signed:  By: Genevie Ann M.D. On: 06/14/2018 09:58  Orson Eva, DO  [facility] Pager [phone]  If 7PM-7AM, please contact night-coverage [email or web address] Password [email or web address] 06/15/2018, 11:03 AM   LOS: 3 days

## 2018-06-15 NOTE — Care Management (Signed)
OP PT referral was made on 06/14/2018. CM noticed on 10/29 that PT recommendation changed to CIR vs SNF. Sent message to PT to discuss if OP PT would still be appropriate.  Now noted in chart that patient has had a change this morning and will be transferred to [facility] for evaluation.  CM at [facility] will defer any further disposition planning to [facility] as patient is transferring.

## 2018-06-16 ENCOUNTER — Inpatient Hospital Stay (HOSPITAL_COMMUNITY): Payer: Medicaid Other

## 2018-06-16 DIAGNOSIS — Z9114 Patient's other noncompliance with medication regimen: Secondary | ICD-10-CM

## 2018-06-16 DIAGNOSIS — J449 Chronic obstructive pulmonary disease, unspecified: Secondary | ICD-10-CM

## 2018-06-16 DIAGNOSIS — I639 Cerebral infarction, unspecified: Secondary | ICD-10-CM

## 2018-06-16 DIAGNOSIS — Z720 Tobacco use: Secondary | ICD-10-CM

## 2018-06-16 DIAGNOSIS — D72829 Elevated white blood cell count, unspecified: Secondary | ICD-10-CM

## 2018-06-16 DIAGNOSIS — J438 Other emphysema: Secondary | ICD-10-CM

## 2018-06-16 DIAGNOSIS — I10 Essential (primary) hypertension: Secondary | ICD-10-CM

## 2018-06-16 DIAGNOSIS — G894 Chronic pain syndrome: Secondary | ICD-10-CM

## 2018-06-16 MED ORDER — POLYETHYLENE GLYCOL 3350 17 G PO PACK
17.0000 g | PACK | Freq: Every day | ORAL | Status: DC
  Administered 2018-06-16 – 2018-06-17 (×2): 17 g via ORAL
  Filled 2018-06-16 (×2): qty 1

## 2018-06-16 MED ORDER — CLOPIDOGREL BISULFATE 75 MG PO TABS
75.0000 mg | ORAL_TABLET | Freq: Every day | ORAL | Status: DC
  Administered 2018-06-16 – 2018-06-17 (×2): 75 mg via ORAL
  Filled 2018-06-16 (×2): qty 1

## 2018-06-16 MED ORDER — ASPIRIN EC 81 MG PO TBEC
81.0000 mg | DELAYED_RELEASE_TABLET | Freq: Every day | ORAL | Status: DC
  Administered 2018-06-17: 81 mg via ORAL
  Filled 2018-06-16: qty 1

## 2018-06-16 NOTE — Progress Notes (Signed)
PROGRESS NOTE  Judy Boyd XAJ:[id number] DOB: 1961-05-26 DOA: 06/12/2018 PCP: Patient, No Pcp Per  Brief History:  57 year old female with a history of hypertension, COPD, depression, and chronic pain presenting with 2-day history of facial droop and dysarthria that began on 06/10/2018. -patient has not seen a primary care physician for over 3 months. She has not been on any of her prescribed medications including aspirin or antihypertensive medications,  smoking 1/2 pack/day.  -Added to any pain emergency room , admitted, stroke work-up was ongoing however morning of 10/29 she had worsening right hemiplegia and hence transferred to [facility] for further evaluation.  Assessment/Plan:  Acute ischemic left pontine stroke - initially presented with mild hemiplegia, facial droop and dysarthria that started on 10/24 -Admitted to [facility], stroke work-up was underway, subsequently on 10/29 had worsening right hemiparesis concerning for extension of stroke -Transferred to [facility] overnight, now has dense right hemiplegia, repeat MRI ordered and pending -Neurology following -MRA brain with no large vessel occlusion, MRI on admission noted acute left pontine infarct with 5 to 6 mm right PCA aneurysm -CTA neck noted patent carotid and vertebral arteries -2D echocardiogram showed preserved EF, no thrombi, no PFO -LDL was 143, started on statin, hemoglobin A1c was 4.6 -Currently on aspirin 325 mg daily -PT OT/SLP evaluations -CIR consulted  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 Carles Collet discussed with neurosurgery (Dr. Zada Finders) about aneurysm--> recommended outpatient follow-up with Dr. Kathyrn Sheriff  Essential hypertension -Allow for permissive hypertension -hydralazine prn SBP>220  Hyperlipidemia -started lipitor 40 mg daily  COPD -Stable on room air  Odontogenic infection -Continue Augmentin -CT head and neck--revealed soft tissue inflammation over  the right mandible with extensive dental disease and right submandibular lymphadenopathy.  Thyroid nodule -Incidental finding of right thyroid nodule -Outpatient follow-up  Tobacco abuse -NicoDerm patch -Counseled.   DVT prophylaxis: Lovenox CODE STATUS full code Family communication: No family at bedside Disposition, will need rehabilitation CIR versus short-term rehab   Consultants:Neurology  Procedures: As Listed in Progress Note Above  Antibiotics: None        Subjective: -Reports that right-sided weakness is unchanged from yesterday afternoon  Objective: Vitals:   06/16/18 0726 06/16/18 0800 06/16/18 0922 06/16/18 1231  BP: (!) 146/83 (!) 147/119  133/88  Pulse: 61 67  (!) 59  Resp: 20 16  16   Temp: 97.9 F (36.6 C) 97.9 F (36.6 C)  98.7 F (37.1 C)  TempSrc: Oral Oral  Oral  SpO2: 98% 100% 99% 100%  Weight:      Height:        Intake/Output Summary (Last 24 hours) at 06/16/2018 1240 Last data filed at 06/15/2018 1700 Gross per 24 hour  Intake 982.32 ml  Output 200 ml  Net 782.32 ml   Weight change:  Exam:  Gen: Awake, Alert, Oriented X 3, flat affect HEENT: Facial droop noted Lungs: Good air movement bilaterally, CTAB CVS: RRR,No Gallops,Rubs or new Murmurs Abd: soft, Non tender, non distended, BS present Extremities: No edema Skin: no new rashes Neuro: Dense right hemiplegia, motor 1/5 in right upper and lower extremity, sensations light touch diminished on the right, plantars are withdrawal    Data Reviewed: I have personally reviewed following labs and imaging studies Basic Metabolic Panel: Recent Labs  Lab 06/12/18 1451 06/12/18 1500 06/15/18 1104  NA 139 141 140  K 3.9 3.9 3.8  CL 106 107 108  CO2 25  --  26  GLUCOSE 86 82 95  BUN 15 16 17   CREATININE 0.80 0.80 0.71  CALCIUM 9.4  --  9.4   Liver Function Tests: Recent Labs  Lab 06/12/18 1451  AST 35  ALT 17  ALKPHOS 92  BILITOT 1.0  PROT 7.8  ALBUMIN  4.0   No results for input(s): LIPASE, AMYLASE in the last 168 hours. No results for input(s): AMMONIA in the last 168 hours. Coagulation Profile: Recent Labs  Lab 06/12/18 1451  INR 0.92   CBC: Recent Labs  Lab 06/12/18 1451 06/12/18 1500 06/15/18 1104  WBC 12.4*  --  11.6*  NEUTROABS 6.8  --   --   HGB 14.2 15.3* 12.5  HCT 46.1* 45.0 41.5  MCV 90.2  --  92.6  PLT 351  --  308   Cardiac Enzymes: No results for input(s): CKTOTAL, CKMB, CKMBINDEX, TROPONINI in the last 168 hours. BNP: Invalid input(s): POCBNP CBG: Recent Labs  Lab 06/12/18 1500  GLUCAP 85   HbA1C: No results for input(s): HGBA1C in the last 72 hours. Urine analysis:    Component Value Date/Time   COLORURINE STRAW (A) 06/12/2018 1454   APPEARANCEUR CLEAR 06/12/2018 1454   LABSPEC 1.003 (L) 06/12/2018 1454   PHURINE 7.0 06/12/2018 1454   GLUCOSEU NEGATIVE 06/12/2018 1454   HGBUR NEGATIVE 06/12/2018 1454   BILIRUBINUR NEGATIVE 06/12/2018 1454   KETONESUR NEGATIVE 06/12/2018 1454   PROTEINUR NEGATIVE 06/12/2018 1454   UROBILINOGEN 0.2 04/06/2012 1430   NITRITE NEGATIVE 06/12/2018 1454   LEUKOCYTESUR NEGATIVE 06/12/2018 1454   Sepsis Labs: @LABRCNTIP (procalcitonin:4,lacticidven:4) )No results found for this or any previous visit (from the past 240 hour(s)).   Scheduled Meds: . amoxicillin-clavulanate  1 tablet Oral Q12H  . aspirin  300 mg Rectal Daily   Or  . aspirin  325 mg Oral Daily  . atorvastatin  40 mg Oral q1800  . enoxaparin (LOVENOX) injection  40 mg Subcutaneous Q24H  . feeding supplement (ENSURE ENLIVE)  237 mL Oral BID BM  . mometasone-formoterol  2 puff Inhalation BID  . montelukast  10 mg Oral QHS   Continuous Infusions:   Procedures/Studies: Ct Head Wo Contrast  Result Date: 06/15/2018 CLINICAL DATA:  Right hemiparesis. EXAM: CT HEAD WITHOUT CONTRAST TECHNIQUE: Contiguous axial images were obtained from the base of the skull through the vertex without intravenous  contrast. COMPARISON:  MRI of June 14, 2018.  CT scan of June 12, 2018. FINDINGS: Brain: New low density is seen in left pontine region consistent with acute infarction. Minimal chronic ischemic white matter disease is noted. No mass effect or midline shift is noted. Ventricular size is within normal limits. No hemorrhage or evidence of mass lesion is noted. Vascular: No hyperdense vessel or unexpected calcification. Skull: Normal. Negative for fracture or focal lesion. Sinuses/Orbits: No acute finding. Other: None. IMPRESSION: Left pontine low density is noted consistent with acute infarction described on prior MRI. No other significant changes noted compared to prior exam. Electronically Signed   By: Marijo Conception, M.D.   On: 06/15/2018 11:33   Ct Head Wo Contrast  Result Date: 06/12/2018 CLINICAL DATA:  Mental status change, facial droop and slurred speech. EXAM: CT HEAD WITHOUT CONTRAST TECHNIQUE: Contiguous axial images were obtained from the base of the skull through the vertex without intravenous contrast. COMPARISON:  None. FINDINGS: Brain: Small ill-defined areas of low attenuation in the region of the left caudate head likely remote lacunar type infarcts particularly given the fact that there is  slight ex vacuo dilatation of the frontal horn this area. No CT findings for hemispheric infarction or intracranial hemorrhage. No extra-axial fluid collections are identified. The gray-white differentiation is maintained. The brainstem and cerebellum are grossly normal. Vascular: No hyperdense vessel or unexpected calcification. Skull: No fracture or bone lesion. Sinuses/Orbits: The paranasal sinuses and mastoid air cells are clear. The globes are intact. Other: No scalp lesions or hematoma. IMPRESSION: 1. Probable remote lacunar type infarcts in the left caudate area. 2. No CT findings for acute hemispheric infarction or intracranial hemorrhage. Electronically Signed   By: Marijo Sanes M.D.   On:  06/12/2018 15:56   Ct Angio Neck W Or Wo Contrast  Result Date: 06/13/2018 CLINICAL DATA:  57 y/o F; right-sided weakness starting 4 days ago. Unable to walk. Worsening symptoms. EXAM: CT ANGIOGRAPHY NECK TECHNIQUE: Multidetector CT imaging of the neck was performed using the standard protocol during bolus administration of intravenous contrast. Multiplanar CT image reconstructions and MIPs were obtained to evaluate the vascular anatomy. Carotid stenosis measurements (when applicable) are obtained utilizing NASCET criteria, using the distal internal carotid diameter as the denominator. CONTRAST:  34mL ISOVUE-370 IOPAMIDOL (ISOVUE-370) INJECTION 76% COMPARISON:  None. FINDINGS: Aortic arch: Standard branching. Imaged portion shows no evidence of aneurysm or dissection. No significant stenosis of the major arch vessel origins. Mild calcific atherosclerosis. Right carotid system: No evidence of dissection, stenosis (50% or greater) or occlusion. Carotid bifurcation calcified plaque without stenosis. Left carotid system: No evidence of dissection, stenosis (50% or greater) or occlusion. Carotid bifurcation fibrofatty plaque without stenosis. Vertebral arteries: Codominant. No evidence of dissection, stenosis (50% or greater) or occlusion. Skeleton: Moderate cervical spondylosis with predominant discogenic degenerative changes at C4-C6 where there is mild reversal of cervical curvature. Other neck: Thyroid nodules measuring up to 19 mm in the right lobe. Inflammation of soft tissues overlying the right body of the mandible. Extensive dental disease with multiple periapical cysts and carries including the right posterior mandibular molar subjacent inflammation. No discrete abscess identified. Right submandibular lymphadenopathy, likely reactive. Upper chest: Mild-to-moderate centrilobular emphysema. IMPRESSION: 1. Patent carotid and vertebral arteries. No dissection, occlusion, aneurysm, or hemodynamically  significant stenosis by NASCET criteria. 2. Soft tissue inflammation over the right body of mandible, probable odontogenic infection, no discrete soft tissue abscess. 3. Extensive dental disease with multiple periapical cysts and carries including the right posterior mandibular molars subjacent to soft tissue inflammation. 4. Right submandibular lymphadenopathy, likely reactive. 5. Thyroid nodules measuring up to 19 mm in the right lobe. Recommend further evaluation with thyroid ultrasound on a nonemergent basis. 6. Aortic Atherosclerosis (ICD10-I70.0) and Emphysema (ICD10-J43.9). Electronically Signed   By: Kristine Garbe M.D.   On: 06/13/2018 02:41   Mr Brain Wo Contrast  Addendum Date: 06/14/2018   ADDENDUM REPORT: 06/14/2018 11:33 ADDENDUM: I spoke to the patient's Nurse Megan by phone at 1124 hours. And the study was discussed by telephone with Dr. Carles Collet on 06/14/2018 at 1130 hours. Electronically Signed   By: Genevie Ann M.D.   On: 06/14/2018 11:33   Result Date: 06/14/2018 CLINICAL DATA:  57 year old female with right facial droop and weakness for 3 days. EXAM: MRI HEAD WITHOUT CONTRAST MRA HEAD WITHOUT CONTRAST TECHNIQUE: Multiplanar, multiecho pulse sequences of the brain and surrounding structures were obtained without intravenous contrast. Angiographic images of the head were obtained using MRA technique without contrast. COMPARISON:  CTA neck yesterday and noncontrast head CT 06/12/2018. FINDINGS: MRI HEAD FINDINGS Brain: Densely restricted diffusion in the left pons in an  area of 12-14 millimeters as seen on series 4, image 75. Associated T2 and FLAIR hyperintensity with mild pontine expansion, but no hemorrhage or significant regional mass effect. Asymmetric curvilinear signal abnormality on trace diffusion in the medial left thalamus seen on series 483, but ADC demonstrates little if any restriction. No restricted diffusion elsewhere. Chronic lacunar infarcts in the bilateral basal ganglia  most pronounced at the left caudate corresponding to the plain head CT finding on 06/12/2018. Small chronic bilateral thalamic lacune is. Patchy T2 and FLAIR hyperintensity in the right pons superimposed on the acute left pontine finding. No cortical encephalomalacia or chronic cerebral blood products identified. No midline shift, mass effect, evidence of mass lesion, ventriculomegaly, extra-axial collection or acute intracranial hemorrhage. Cervicomedullary junction and pituitary are within normal limits. Vascular: Major intracranial vascular flow voids are preserved. Skull and upper cervical spine: Negative visible cervical spine. Visualized bone marrow signal is within normal limits. Sinuses/Orbits: Normal orbits soft tissues. Paranasal Visualized paranasal sinuses and mastoids are stable and well pneumatized. Other: Grossly normal visible internal auditory structures. Scalp and visible face soft tissues appear negative. MRA HEAD FINDINGS Antegrade flow in the posterior circulation with no distal vertebral artery stenosis. Mildly dominant distal left vertebral artery. Patent PICA origins and vertebrobasilar junction. Patent basilar artery without irregularity or stenosis. Normal SCA and left PCA origins. Fetal right PCA origin. Left posterior communicating artery diminutive or absent. Bilateral PCA branches are within normal limits. Antegrade flow in both ICA siphons. Mild siphon irregularity but no siphon stenosis. Arising at the right posterior communicating artery origin there is a lobulated inferiorly and slightly laterally and anteriorly directed aneurysm measuring 5 x 6 millimeters (series 8, image 54 and series 103, image 24). The right Pcomm seems to arise from the aneurysm as demonstrated on image 54. Otherwise normal carotid termini. The MCA and ACA origins are within normal limits. The right A1 is mildly dominant. The anterior communicating artery may be fenestrated. The ACA branches are tortuous but  otherwise within normal limits. Left MCA M1 segment, left MCA bifurcation, and left MCA branches are within normal limits. The right M1 bifurcates early. The right MCA branches are within normal limits. IMPRESSION: 1. 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 2. 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 Unfortunately, the right Pcomm which is a fetal origin of the Right PCA origin to arise directly from the aneurysm. Neuro-Interventional Radiology consultation is suggested to evaluate the appropriateness of potential treatment. Non-emergent evaluation can be arranged by calling [phone] during usual hours. Emergency evaluation can be requested by paging [phone]. 3. Subacute lacunar infarct of the left thalamus, with underlying moderately advanced bilateral deep gray matter small vessel ischemia. 4. Intracranial MRA is negative for intracranial stenosis or branch occlusion. Electronically Signed: By: Genevie Ann M.D. On: 06/14/2018 09:58   Mr Jodene Nam Head/brain ON Cm  Addendum Date: 06/14/2018   ADDENDUM REPORT: 06/14/2018 11:33 ADDENDUM: I spoke to the patient's Nurse Megan by phone at 1124 hours. And the study was discussed by telephone with Dr. Carles Collet on 06/14/2018 at 1130 hours. Electronically Signed   By: Genevie Ann M.D.   On: 06/14/2018 11:33   Result Date: 06/14/2018 CLINICAL DATA:  57 year old female with right facial droop and weakness for 3 days. EXAM: MRI HEAD WITHOUT CONTRAST MRA HEAD WITHOUT CONTRAST TECHNIQUE: Multiplanar, multiecho pulse sequences of the brain and surrounding structures  were obtained without intravenous contrast. Angiographic images of the head were obtained using MRA technique without contrast. COMPARISON:  CTA neck yesterday and noncontrast head CT 06/12/2018. FINDINGS: MRI HEAD FINDINGS Brain: Densely restricted diffusion in the  left pons in an area of 12-14 millimeters as seen on series 4, image 75. Associated T2 and FLAIR hyperintensity with mild pontine expansion, but no hemorrhage or significant regional mass effect. Asymmetric curvilinear signal abnormality on trace diffusion in the medial left thalamus seen on series 483, but ADC demonstrates little if any restriction. No restricted diffusion elsewhere. Chronic lacunar infarcts in the bilateral basal ganglia most pronounced at the left caudate corresponding to the plain head CT finding on 06/12/2018. Small chronic bilateral thalamic lacune is. Patchy T2 and FLAIR hyperintensity in the right pons superimposed on the acute left pontine finding. No cortical encephalomalacia or chronic cerebral blood products identified. No midline shift, mass effect, evidence of mass lesion, ventriculomegaly, extra-axial collection or acute intracranial hemorrhage. Cervicomedullary junction and pituitary are within normal limits. Vascular: Major intracranial vascular flow voids are preserved. Skull and upper cervical spine: Negative visible cervical spine. Visualized bone marrow signal is within normal limits. Sinuses/Orbits: Normal orbits soft tissues. Paranasal Visualized paranasal sinuses and mastoids are stable and well pneumatized. Other: Grossly normal visible internal auditory structures. Scalp and visible face soft tissues appear negative. MRA HEAD FINDINGS Antegrade flow in the posterior circulation with no distal vertebral artery stenosis. Mildly dominant distal left vertebral artery. Patent PICA origins and vertebrobasilar junction. Patent basilar artery without irregularity or stenosis. Normal SCA and left PCA origins. Fetal right PCA origin. Left posterior communicating artery diminutive or absent. Bilateral PCA branches are within normal limits. Antegrade flow in both ICA siphons. Mild siphon irregularity but no siphon stenosis. Arising at the right posterior communicating artery origin  there is a lobulated inferiorly and slightly laterally and anteriorly directed aneurysm measuring 5 x 6 millimeters (series 8, image 54 and series 103, image 24). The right Pcomm seems to arise from the aneurysm as demonstrated on image 54. Otherwise normal carotid termini. The MCA and ACA origins are within normal limits. The right A1 is mildly dominant. The anterior communicating artery may be fenestrated. The ACA branches are tortuous but otherwise within normal limits. Left MCA M1 segment, left MCA bifurcation, and left MCA branches are within normal limits. The right M1 bifurcates early. The right MCA branches are within normal limits. IMPRESSION: 1. 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 2. 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 Unfortunately, the right Pcomm which is a fetal origin of the Right PCA origin to arise directly from the aneurysm. Neuro-Interventional Radiology consultation is suggested to evaluate the appropriateness of potential treatment. Non-emergent evaluation can be arranged by calling [phone] during usual hours. Emergency evaluation can be requested by paging [phone]. 3. Subacute lacunar infarct of the left thalamus, with underlying moderately advanced bilateral deep gray matter small vessel ischemia. 4. Intracranial MRA is negative for intracranial stenosis or branch occlusion. Electronically Signed: By: Genevie Ann M.D. On: 06/14/2018 09:58    Domenic Polite, MD [facility] Page via [email or web address]  If 7PM-7AM, please contact night-coverage [email or web address] Password [email or web address] 06/16/2018, 12:40 PM   LOS: 4 days

## 2018-06-16 NOTE — Progress Notes (Addendum)
Patient stated, "I am on an antibiotic because I have a tooth ache" and "the doctor put me on it to help."

## 2018-06-16 NOTE — Progress Notes (Addendum)
  Speech Language Pathology Treatment: Cognitive-Linquistic  Patient Details Name: Judy Boyd MRN: [id number] DOB: 1961/08/05 Today's Date: 06/16/2018 Time: 1165-7903 SLP Time Calculation (min) (ACUTE ONLY): 20 min  Assessment / Plan / Recommendation Clinical Impression  Skilled treatment session focused communication, cognition and education. With Mod A cues pt able to demonstrate over-articulation at the phrase level to increase speech intelligibility to ~ 50%. Pt continues with moderate to severe imprecise articulation at the sentence level that is further compromised by decreased vocal intensity and pt not looking at SLP. Pt able to perform naming tasks with good accuracy. Pt continues with decreased anticipatory awareness and requires Max A cues for emergent and anticipatory awareness. Education provided on stroke recovery and need for therapeutic interventions to increase outcome. Although pt is frustrated with being in hospital, she voices understanding of need for therapeutic intervention.    HPI HPI: 57 year old female with a history of hypertension, COPD, depression, and chronic pain presenting with 2-day history of facial droop and dysarthria that began on 06/10/2018.  The patient complained of right upper extremity and right lower extremity weakness that started around the same time.  Her daughter subsequently saw and spoke with the patient on the morning of 06/12/2018 and urged the patient to be evaluated.  The patient has not seen a primary care physician for over 3 months.  She has not been on any of her prescribed medications including aspirin or antihypertensive medications. 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 Unfortunately, the right Pcomm which is a fetal origin of the Right PCA origin to arise directly from the aneurysm.  Subacute lacunar infarct of the left thalamus, with underlying moderately advanced bilateral deep gray matter small vessel      SLP Plan  Continue with current plan of care                      General recommendations: Rehab consult Follow up Recommendations: Inpatient Rehab SLP Visit Diagnosis: Cognitive communication deficit (R41.841);Dysarthria and anarthria (R47.1) Plan: Continue with current plan of care       [place] 06/16/2018, 4:05 PM

## 2018-06-16 NOTE — Progress Notes (Signed)
Occupational Therapy Treatment Patient Details Name: Judy Boyd MRN: [id number] DOB: 11/07/1960 Today's Date: 06/16/2018    History of present illness 57 year old female with a history of hypertension, COPD, depression, and chronic pain presenting with 2-day history of facial droop and dysarthria that began on 06/10/2018.  The patient complained of right upper extremity and right lower extremity weakness that started around the same time. On 10/29 her right hemiparesis was noted to have worsened, which was felt most likely to be due to extension of her stroke. Pt was moved from [facility] to [facility] on 06/15/2018. CT scan on 06/15/2018 stated Left pontine low density is noted consistent with acute infarction described on prior MRI.   OT comments  Patient seated in chair upon entry, soiled, and agreeable to participation in OT.  Patient completes stand pivot with mod assist given therapist blocking R knee during transfers, cueing for safety, sequencing.  Patient completed self care at sink: seated UB with min assist for bathing, mod assist for dressing, and min assist for applying deodorant, LB dressing with mod assist for bathing and total assist for dressing.  Educated patient on importance of fall prevention, and calling for assistance; patient highly independent and is a high fall risk due to R hemiparesis, appears to understand recommendations but decreased awareness to deficits and functional impact.  Patient educated on and return demonstrated SROM exercises to shoulder, elbow, forearm and wrist; encouraged patient to complete exercises 2-3x /day due to noted flexor tone synergy and tightness.  Continue to recommend CIR level rehab.  Will continue to follow while admitted.    Follow Up Recommendations  CIR;Supervision/Assistance - 24 hour    Equipment Recommendations  Other (comment)(TBD at next venue of care)    Recommendations for Other Services Rehab consult;Speech consult     Precautions / Restrictions Precautions Precautions: Fall Precaution Comments: right side hemiparesis Restrictions Weight Bearing Restrictions: No       Mobility Bed Mobility               General bed mobility comments: seated in chair upon entry   Transfers Overall transfer level: Needs assistance Equipment used: None Transfers: Sit to/from Stand;Stand Pivot Transfers Sit to Stand: Min assist Stand pivot transfers: Mod assist       General transfer comment: Requires R knee blocking during sit to stand, mod assist for stand pivot with cueing for sequencing and safety due to decreased functional use of R side     Balance Overall balance assessment: Needs assistance Sitting-balance support: Feet supported;No upper extremity supported Sitting balance-Leahy Scale: Fair     Standing balance support: During functional activity;No upper extremity supported Standing balance-Leahy Scale: Poor Standing balance comment: required mod assist to maintain balance during self care tasks, blocking R knee in standing                            ADL either performed or assessed with clinical judgement   ADL Overall ADL's : Needs assistance/impaired     Grooming: Applying deodorant;Moderate assistance;Sitting   Upper Body Bathing: Minimal assistance;Sitting Upper Body Bathing Details (indicate cue type and reason): min assist for L UE and at times supporting R UE for protection  Lower Body Bathing: Minimal assistance;+2 for safety/equipment;Sit to/from stand Lower Body Bathing Details (indicate cue type and reason): standing at sink with therapist supporting R knee, 0 hand support, min assist to wash buttocks, peri area and upper legs  Upper Body Dressing : Moderate assistance;Sitting Upper Body Dressing Details (indicate cue type and reason): to thread R UE and tie gown, pt able to recall hemi dressing technique but demonstrates poor carryover of technqiue Lower Body  Dressing: Total assistance;Sit to/from stand Lower Body Dressing Details (indicate cue type and reason): pt requires total assist to don new brief in standing  Toilet Transfer: Stand-pivot;Moderate assistance(t orecliner ) [employer] forces technical officer Details (indicate cue type and reason): mod assist to ascend and maintain balance during stand pivot towards L side, cueing for seqencing and safety         Functional mobility during ADLs: Moderate assistance General ADL Comments: requires assist to block R knee throughout session in standing, non functional use of R UE during session      Vision       Perception     Praxis      Cognition Arousal/Alertness: Awake/alert Behavior During Therapy: Agitated;Flat affect;Impulsive Overall Cognitive Status: Impaired/Different from baseline Area of Impairment: Problem solving;Awareness                           Awareness: Emergent Problem Solving: Requires verbal cues;Slow processing          Exercises Exercises: General Upper Extremity;Other exercises General Exercises - Upper Extremity Shoulder Flexion: PROM;Self ROM;10 reps;Right;Seated Elbow Flexion: PROM;Self ROM;Right;10 reps;Seated Elbow Extension: PROM;Self ROM;Right;10 reps;Seated Wrist Flexion: PROM;Self ROM;Right;10 reps;Seated Wrist Extension: PROM;Right;10 reps;Self ROM;Seated Digit Composite Flexion: PROM;Right;5 reps;Seated Composite Extension: PROM;Right;5 reps;Seated Other Exercises Other Exercises: Scapular mobilizations, passive range of motion and stretch from shoulder to hand, educated on self ROM exercises of above exercises listed with pt return demosntrating with min cueing    Shoulder Instructions       General Comments      Pertinent Vitals/ Pain       Pain Assessment: No/denies pain  Home Living                                          Prior Functioning/Environment              Frequency  Min 2X/week        Progress  Toward Goals  OT Goals(current goals can now be found in the care plan section)  Progress towards OT goals: Progressing toward goals  Acute Rehab OT Goals Patient Stated Goal: get strong enough to return home independently  Plan Discharge plan remains appropriate;Frequency remains appropriate    Co-evaluation                 AM-PAC PT "6 Clicks" Daily Activity     Outcome Measure   Help from another person eating meals?: A Little Help from another person taking care of personal grooming?: A Little Help from another person toileting, which includes using toliet, bedpan, or urinal?: A Lot Help from another person bathing (including washing, rinsing, drying)?: A Lot Help from another person to put on and taking off regular upper body clothing?: A Lot Help from another person to put on and taking off regular lower body clothing?: A Lot 6 Click Score: 14    End of Session Equipment Utilized During Treatment: Gait belt  OT Visit Diagnosis: Muscle weakness (generalized) (M62.81);Hemiplegia and hemiparesis Hemiplegia - Right/Left: Right Hemiplegia - dominant/non-dominant: Dominant Hemiplegia - caused by: Cerebral infarction   Activity Tolerance Patient tolerated treatment well  Patient Left in chair;with call bell/phone within reach;with chair alarm set   Nurse Communication Mobility status        Time: 3943-2003 OT Time Calculation (min): 28 min  Charges: OT General Charges $OT Visit: 1 Visit OT Treatments $Self Care/Home Management : 8-22 mins $Neuromuscular Re-education: 8-22 mins  Delight Stare, OT Acute Rehabilitation Services Pager [phone] Office [phone]    Delight Stare 06/16/2018, 4:11 PM

## 2018-06-16 NOTE — Consult Note (Addendum)
Referring Physician: Dr. Carles Collet    Chief Complaint: Worsening right sided weakness    HPI: Judy Boyd is an 57 y.o. female who presented initially to [facility] on 10/26 with a 2 day history of right facial droop, dysarthria and right upper and lower extremity weakness. Her symptoms had begun on 10/24. The patient had been noncompliant with her home medications, including ASA and antihypertensives. She was started on ASA 325 mg po qd. 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 CTA neck showed patent carotid and vertebral arteries. Echocardiogram revealed an EF of 60-65% with no thrombus or PFO. LDL was 143 and HbA1C was 4.6. On 10/29 her right hemiparesis was noted to have worsened, which was felt most likely to be due to extension of her stroke. She was transferred to [facility] for Neurological evaluation.    Past Medical History:  Diagnosis Date  . Anxiety   . Arthritis   . Asthma   . Bowel obstruction ([place])   . Chronic pain   . COPD (chronic obstructive pulmonary disease) ([place])   . Depression   . GERD (gastroesophageal reflux disease)   . Hypertension     Past Surgical History:  Procedure Laterality Date  . ABDOMINAL SURGERY  2003   blockage  . left salpingectomy    . uterine ablation      Family History  Problem Relation Age of Onset  . Heart disease Mother   . Heart disease Father    Social History:  reports that she has been smoking cigarettes. She has a 10.00 pack-year smoking history. She has never used smokeless tobacco. She reports that she drinks alcohol. She reports that she does not use drugs.  Allergies:  Allergies  Allergen Reactions  . Amlodipine Other (See Comments)    Dehydration     Medications:  Scheduled: . amoxicillin-clavulanate  1 tablet Oral Q12H  . aspirin  300 mg Rectal Daily   Or  . aspirin  325 mg Oral Daily  . atorvastatin  40 mg Oral q1800  . enoxaparin (LOVENOX)  injection  40 mg Subcutaneous Q24H  . feeding supplement (ENSURE ENLIVE)  237 mL Oral BID BM  . mometasone-formoterol  2 puff Inhalation BID  . montelukast  10 mg Oral QHS     ROS: No headache, neck pain, or chest pain. No vision loss. States only new symptom is worsened RUE weakness with stable dysarthria, facial droop and RLE weakness.   Physical Examination: Blood pressure (!) 156/92, pulse 77, temperature 98.4 F (36.9 C), temperature source Oral, resp. rate 19, height _0  (1.676 m), weight 58.2 kg, SpO2 99 %.  HEENT: [place]/AT Lungs: Respirations unlabored Ext: No edema  Neurologic Examination: Mental Status: Alert, fully oriented, thought content appropriate.  Speech dysarthric but fluent without errors of grammar or syntax. Had difficulty with a 2 step directional command. Naming and repetition intact. Cranial Nerves: II:  Visual fields intact bilaterally with each eye tested individually. No extinction to DSS. PERRL.  III,IV, VI: No ptosis. EOMI without nystagmus.  V,VII: Prominent right facial droop. Temp sensation equal bilaterally.  VIII: hearing intact to conversation IX,X: Palate elevation is asymmetric.  XI: Weak shoulder shrug on right.  XII: midline tongue extension  Motor: RUE: 2/5 with some resistance to examiner at biceps, triceps, as well as occasional flicker of movement at digits. No movement to right deltoid. RLE: Externally rotated at rest.  Flexes at hip and knee 2/5. Trace movement at ankle LUE and LLE: 5/5 Sensory: Temp and light touch intact bilateral upper and lower extremities without extinction.  Deep Tendon Reflexes:  3+ RUE and RLE 2+ LUE and LLE Plantars: Equivocal bilaterally Cerebellar: No ataxia with FNF on left. Unable to perform on right.  Gait: Unable to assess   Results for orders placed or performed during the hospital encounter of 06/12/18 (from the past 48 hour(s))  Basic metabolic panel     Status: None   Collection Time: 06/15/18  11:04 AM  Result Value Ref Range   Sodium 140 135 - 145 mmol/L   Potassium 3.8 3.5 - 5.1 mmol/L   Chloride 108 98 - 111 mmol/L   CO2 26 22 - 32 mmol/L   Glucose, Bld 95 70 - 99 mg/dL   BUN 17 6 - 20 mg/dL   Creatinine, Ser 0.71 0.44 - 1.00 mg/dL   Calcium 9.4 8.9 - 10.3 mg/dL   GFR calc non Af Amer >60 >60 mL/min   GFR calc Af Amer >60 >60 mL/min    Comment: (NOTE) The eGFR has been calculated using the CKD EPI equation. This calculation has not been validated in all clinical situations. eGFR's persistently <60 mL/min signify possible Chronic Kidney Disease.    Anion gap 6 5 - 15    Comment: Performed at [facility], [address], [place], [place] [address]  CBC     Status: Abnormal   Collection Time: 06/15/18 11:04 AM  Result Value Ref Range   WBC 11.6 (H) 4.0 - 10.5 K/uL   RBC 4.48 3.87 - 5.11 MIL/uL   Hemoglobin 12.5 12.0 - 15.0 g/dL   HCT 41.5 36.0 - 46.0 %   MCV 92.6 80.0 - 100.0 fL   MCH 27.9 26.0 - 34.0 pg   MCHC 30.1 30.0 - 36.0 g/dL   RDW 12.6 11.5 - 15.5 %   Platelets 308 150 - 400 K/uL   nRBC 0.0 0.0 - 0.2 %    Comment: Performed at [facility], [address], [place],  [address]   Ct Head Wo Contrast  Result Date: 06/15/2018 CLINICAL DATA:  Right hemiparesis. EXAM: CT HEAD WITHOUT CONTRAST TECHNIQUE: Contiguous axial images were obtained from the base of the skull through the vertex without intravenous contrast. COMPARISON:  MRI of June 14, 2018.  CT scan of June 12, 2018. FINDINGS: Brain: New low density is seen in left pontine region consistent with acute infarction. Minimal chronic ischemic white matter disease is noted. No mass effect or midline shift is noted. Ventricular size is within normal limits. No hemorrhage or evidence of mass lesion is noted. Vascular: No hyperdense vessel or unexpected calcification. Skull: Normal. Negative for fracture or focal lesion. Sinuses/Orbits: No acute finding. Other: None. IMPRESSION: Left pontine low density  is noted consistent with acute infarction described on prior MRI. No other significant changes noted compared to prior exam. Electronically Signed   By: Marijo Conception, M.D.   On: 06/15/2018 11:33   Mr Brain Wo Contrast  Addendum Date: 06/14/2018   ADDENDUM REPORT: 06/14/2018 11:33 ADDENDUM: I spoke to the patient's Nurse Megan by phone at 1124 hours. And the study was discussed by telephone with Dr. Carles Collet on 06/14/2018 at 1130 hours. Electronically Signed   By: Genevie Ann M.D.   On: 06/14/2018 11:33   Result Date: 06/14/2018 CLINICAL DATA:  57 year old female with right facial droop and weakness for 3 days. EXAM: MRI HEAD  WITHOUT CONTRAST MRA HEAD WITHOUT CONTRAST TECHNIQUE: Multiplanar, multiecho pulse sequences of the brain and surrounding structures were obtained without intravenous contrast. Angiographic images of the head were obtained using MRA technique without contrast. COMPARISON:  CTA neck yesterday and noncontrast head CT 06/12/2018. FINDINGS: MRI HEAD FINDINGS Brain: Densely restricted diffusion in the left pons in an area of 12-14 millimeters as seen on series 4, image 75. Associated T2 and FLAIR hyperintensity with mild pontine expansion, but no hemorrhage or significant regional mass effect. Asymmetric curvilinear signal abnormality on trace diffusion in the medial left thalamus seen on series 483, but ADC demonstrates little if any restriction. No restricted diffusion elsewhere. Chronic lacunar infarcts in the bilateral basal ganglia most pronounced at the left caudate corresponding to the plain head CT finding on 06/12/2018. Small chronic bilateral thalamic lacune is. Patchy T2 and FLAIR hyperintensity in the right pons superimposed on the acute left pontine finding. No cortical encephalomalacia or chronic cerebral blood products identified. No midline shift, mass effect, evidence of mass lesion, ventriculomegaly, extra-axial collection or acute intracranial hemorrhage. Cervicomedullary  junction and pituitary are within normal limits. Vascular: Major intracranial vascular flow voids are preserved. Skull and upper cervical spine: Negative visible cervical spine. Visualized bone marrow signal is within normal limits. Sinuses/Orbits: Normal orbits soft tissues. Paranasal Visualized paranasal sinuses and mastoids are stable and well pneumatized. Other: Grossly normal visible internal auditory structures. Scalp and visible face soft tissues appear negative. MRA HEAD FINDINGS Antegrade flow in the posterior circulation with no distal vertebral artery stenosis. Mildly dominant distal left vertebral artery. Patent PICA origins and vertebrobasilar junction. Patent basilar artery without irregularity or stenosis. Normal SCA and left PCA origins. Fetal right PCA origin. Left posterior communicating artery diminutive or absent. Bilateral PCA branches are within normal limits. Antegrade flow in both ICA siphons. Mild siphon irregularity but no siphon stenosis. Arising at the right posterior communicating artery origin there is a lobulated inferiorly and slightly laterally and anteriorly directed aneurysm measuring 5 x 6 millimeters (series 8, image 54 and series 103, image 24). The right Pcomm seems to arise from the aneurysm as demonstrated on image 54. Otherwise normal carotid termini. The MCA and ACA origins are within normal limits. The right A1 is mildly dominant. The anterior communicating artery may be fenestrated. The ACA branches are tortuous but otherwise within normal limits. Left MCA M1 segment, left MCA bifurcation, and left MCA branches are within normal limits. The right M1 bifurcates early. The right MCA branches are within normal limits. IMPRESSION: 1. 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 2. 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  Unfortunately, the right Pcomm which is a fetal origin of the Right PCA origin to arise directly from the aneurysm. Neuro-Interventional Radiology consultation is suggested to evaluate the appropriateness of potential treatment. Non-emergent evaluation can be arranged by calling [phone] during usual hours. Emergency evaluation can be requested by paging [phone]. 3. Subacute lacunar infarct of the left thalamus, with underlying moderately advanced bilateral deep gray matter small vessel ischemia. 4. Intracranial MRA is negative for intracranial stenosis or branch occlusion. Electronically Signed: By: Genevie Ann M.D. On: 06/14/2018 09:58   Mr Jodene Nam Head/brain ZS Cm  Addendum Date: 06/14/2018   ADDENDUM REPORT: 06/14/2018 11:33 ADDENDUM: I spoke to the patient's Nurse Megan by phone at 1124 hours. And the study was discussed by telephone with Dr. Carles Collet  on 06/14/2018 at 1130 hours. Electronically Signed   By: Genevie Ann M.D.   On: 06/14/2018 11:33   Result Date: 06/14/2018 CLINICAL DATA:  57 year old female with right facial droop and weakness for 3 days. EXAM: MRI HEAD WITHOUT CONTRAST MRA HEAD WITHOUT CONTRAST TECHNIQUE: Multiplanar, multiecho pulse sequences of the brain and surrounding structures were obtained without intravenous contrast. Angiographic images of the head were obtained using MRA technique without contrast. COMPARISON:  CTA neck yesterday and noncontrast head CT 06/12/2018. FINDINGS: MRI HEAD FINDINGS Brain: Densely restricted diffusion in the left pons in an area of 12-14 millimeters as seen on series 4, image 75. Associated T2 and FLAIR hyperintensity with mild pontine expansion, but no hemorrhage or significant regional mass effect. Asymmetric curvilinear signal abnormality on trace diffusion in the medial left thalamus seen on series 483, but ADC demonstrates little if any restriction. No restricted diffusion elsewhere. Chronic lacunar infarcts in the bilateral basal ganglia most  pronounced at the left caudate corresponding to the plain head CT finding on 06/12/2018. Small chronic bilateral thalamic lacune is. Patchy T2 and FLAIR hyperintensity in the right pons superimposed on the acute left pontine finding. No cortical encephalomalacia or chronic cerebral blood products identified. No midline shift, mass effect, evidence of mass lesion, ventriculomegaly, extra-axial collection or acute intracranial hemorrhage. Cervicomedullary junction and pituitary are within normal limits. Vascular: Major intracranial vascular flow voids are preserved. Skull and upper cervical spine: Negative visible cervical spine. Visualized bone marrow signal is within normal limits. Sinuses/Orbits: Normal orbits soft tissues. Paranasal Visualized paranasal sinuses and mastoids are stable and well pneumatized. Other: Grossly normal visible internal auditory structures. Scalp and visible face soft tissues appear negative. MRA HEAD FINDINGS Antegrade flow in the posterior circulation with no distal vertebral artery stenosis. Mildly dominant distal left vertebral artery. Patent PICA origins and vertebrobasilar junction. Patent basilar artery without irregularity or stenosis. Normal SCA and left PCA origins. Fetal right PCA origin. Left posterior communicating artery diminutive or absent. Bilateral PCA branches are within normal limits. Antegrade flow in both ICA siphons. Mild siphon irregularity but no siphon stenosis. Arising at the right posterior communicating artery origin there is a lobulated inferiorly and slightly laterally and anteriorly directed aneurysm measuring 5 x 6 millimeters (series 8, image 54 and series 103, image 24). The right Pcomm seems to arise from the aneurysm as demonstrated on image 54. Otherwise normal carotid termini. The MCA and ACA origins are within normal limits. The right A1 is mildly dominant. The anterior communicating artery may be fenestrated. The ACA branches are tortuous but  otherwise within normal limits. Left MCA M1 segment, left MCA bifurcation, and left MCA branches are within normal limits. The right M1 bifurcates early. The right MCA branches are within normal limits. IMPRESSION: 1. 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 2. 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 Unfortunately, the right Pcomm which is a fetal origin of the Right PCA origin to arise directly from the aneurysm. Neuro-Interventional Radiology consultation is suggested to evaluate the appropriateness of potential treatment. Non-emergent evaluation can be arranged by calling [phone] during usual hours. Emergency evaluation can be requested by paging [phone]. 3. Subacute lacunar infarct of the left thalamus, with underlying moderately advanced bilateral deep gray matter small vessel ischemia. 4. Intracranial MRA is negative for intracranial stenosis or branch occlusion. Electronically Signed: By: Genevie Ann M.D. On: 06/14/2018  09:91    Assessment: 57 y.o. female transferred from [facility] after worsened right sided weakness following initial admission for acute left pontine ischemic infarction.  1. MRI at the time of her initial presentation to [facility] showed an acute left pontine ischemic infarction. Neurological exam at [facility] does not reveal findings suggesting a new localization, but is compatible with possible worsening of edema/mass effect in the region of the ischemic infarction, given recent worsening of her RUE and RLE weakness.   2. MRI also revealed a subacute lacunar infarct of the left thalamus, with underlying moderately advanced bilateral deep gray matter small vessel Ischemia. 3. 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 Stroke Risk Factors - HTN  Plan: 1. HgbA1c, fasting lipid panel 2. Repeat MRI brain without contrast 3. PT consult, OT  consult, Speech consult. Will need PM&R evaluation as well.  4. Prophylactic therapy-Continue ASA at 325 mg po qd. Continue atorvastatin.  5. Risk factor modification 6. Telemetry monitoring 7. Frequent neuro checks 8. Aneurysm discussed between primary team and Neurosurgery. Plan is for outpatient Neurosurgery follow up. Of note, the right Pcomm, which comprises a fetal origin of the Right PCA, arises directly from the aneurysm, which may render the aneurysm not amenable to surgical or endovascular treatment.    _0  signed: Dr. Kerney Elbe 06/16/2018, 3:41 AM

## 2018-06-16 NOTE — Progress Notes (Signed)
Physical Therapy Treatment Patient Details Name: Judy Boyd MRN: [id number] DOB: [birth year] Today's Date: 06/16/2018    History of Present Illness 57 year old female with a history of hypertension, COPD, depression, and chronic pain presenting with 2-day history of facial droop and dysarthria that began on 06/10/2018.  The patient complained of right upper extremity and right lower extremity weakness that started around the same time. On 10/29 her right hemiparesis was noted to have worsened, which was felt most likely to be due to extension of her stroke. Pt was moved from [facility] [facility] on 06/15/2018. CT scan on 06/15/2018 stated Left pontine low density is noted consistent with acute infarction described on prior MRI.    PT Comments    Pt presented supine, HOB elevated, and alert with nursing staff in the room. Pt was willing to participate in PT. Pt completed rolling with modified independence and supine to sit with min A for RLE management. Pt showed stability in sitting, without assistance required, with decreased proprioception of RUE. Pt showed R sided sensory deficits of C7 and dorsal/plantar foot. R LE presented with trace (1) for MMT of hip flexors and knee extensors, and 0 for R UE biceps. Sit to stand required min guard of RLE to prevent buckling, with pt assist using LUE for push off on bed rail and lateral weight shift towards LLE. + 2 mod assist required to complete stand pivot transfer into chair, with therapist facilitating RLE to prevent buckling. Pt would continue to benefit from skilled PT in order to improve transfer ability, strength, gait and functional abilities.           Follow Up Recommendations  CIR     Equipment Recommendations  3in1 (PT)    Recommendations for Other Services       Precautions / Restrictions Precautions Precautions: Fall Precaution Comments: right side hemiparesis Restrictions Weight Bearing Restrictions: No Other  Position/Activity Restrictions: R LE buckles with weight acceptance    Mobility  Bed Mobility Overal bed mobility: Needs Assistance Bed Mobility: Rolling;Supine to Sit Rolling: Modified independent (Device/Increase time)   Supine to sit: Min assist     General bed mobility comments: Rolling needed assistance removing all bed sheets. During supine to sit, min assist needed to swing R LE over EOB. LUE/LE facilitates all movement.   Transfers Overall transfer level: Needs assistance Equipment used: 1 person hand held assist Transfers: Sit to/from [facility] Sit to Stand: Min guard Stand pivot transfers: +2 physical assistance;Mod assist       General transfer comment: During sit to stand, min gaurd used to prevent RLE from buckling. During sit to stand, +2 assistance used. One had HHA to steady the patient, the other was mod assist in preventing RLE from buckling movement assistance   Ambulation/Gait                 Stairs             Wheelchair Mobility    Modified Rankin (Stroke Patients Only) Modified Rankin (Stroke Patients Only) Pre-Morbid Rankin Score: No symptoms Modified Rankin: Severe disability     Balance Overall balance assessment: Needs assistance Sitting-balance support: Feet supported;No upper extremity supported Sitting balance-Leahy Scale: Fair Sitting balance - Comments: Pt able to maintain sitting balance during evaluation of strength and sensation testing. Unaware of RUE position in space   Standing balance support: During functional activity;Bilateral upper extremity supported Standing balance-Leahy Scale: Zero Standing balance comment: Pt required +2 assist, +  1 HHA for stability and +1 mod assist for RLE stabilization                            Cognition Arousal/Alertness: Awake/alert Behavior During Therapy: Flat affect Overall Cognitive Status: Impaired/Different from baseline Area of Impairment:  Problem solving                             Problem Solving: Requires verbal cues;Slow processing        Exercises      General Comments        Pertinent Vitals/Pain Pain Assessment: No/denies pain    Home Living                      Prior Function            PT Goals (current goals can now be found in the care plan section) Acute Rehab PT Goals Patient Stated Goal: get strong enough to return home independently PT Goal Formulation: With patient Time For Goal Achievement: 06/18/18 Potential to Achieve Goals: Fair Progress towards PT goals: Progressing toward goals    Frequency    Min 4X/week      PT Plan Current plan remains appropriate    Co-evaluation              AM-PAC PT "6 Clicks" Daily Activity  Outcome Measure  Difficulty turning over in bed (including adjusting bedclothes, sheets and blankets)?: A Little Difficulty moving from lying on back to sitting on the side of the bed? : A Lot Difficulty sitting down on and standing up from a chair with arms (e.g., wheelchair, bedside commode, etc,.)?: Unable Help needed moving to and from a bed to chair (including a wheelchair)?: Total Help needed walking in hospital room?: Total Help needed climbing 3-5 steps with a railing? : Total 6 Click Score: 9    End of Session Equipment Utilized During Treatment: Gait belt Activity Tolerance: Patient tolerated treatment well Patient left: in chair;with call bell/phone within reach;with chair alarm set Nurse Communication: Mobility status;Other (comment)(Begin transfers for pt toileting) PT Visit Diagnosis: Unsteadiness on feet (R26.81);Muscle weakness (generalized) (M62.81);Hemiplegia and hemiparesis Hemiplegia - Right/Left: Right Hemiplegia - dominant/non-dominant: Dominant Hemiplegia - caused by: Cerebral infarction     Time: 2035-5974 PT Time Calculation (min) (ACUTE ONLY): 28 min  Charges:  $Therapeutic Activity: 23-37  mins                     Wandra Feinstein, SPT Acute Rehab [phone] (pager) [phone] (office)   Judy Boyd 06/16/2018, 1:45 PM

## 2018-06-16 NOTE — Consult Note (Signed)
Physical Medicine and Rehabilitation Consult   Reason for Consult: Stroke with functional deficits Referring Physician: Dr. Broadus John   HPI: Judy Boyd is a 57 y.o. RH- female with history of COPD, HTN, tobacco use, medication noncompliance, chronic pain; who was admitted to [facility] 06/12/2018 with 2-day history of right facial droop with dysarthria and right-sided weakness.  History taken from chart review and patient.  CT of head done revealing low attenuation in the region of the left caudate head likely due to remote lacunar infarcts.  CTA head neck showed no significant stenosis and soft tissue inflammation of right mandible as well as extensive dental disease and right thyroid nodule.  2D echo done revealing EF of 60 to 65%, moderate LVH and no thrombus or PFO noted.  On 10/29 patient reported to have increase in right-sided weakness felt to be due to extension of stroke and she was transferred to [facility] for further work-up.   MRI brain reviewed, showing multiple left infarcts.  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  Neurology felt that patient with recent worsening due to edema/mass-effect.  Stroke felt to be due to small vessel disease and risk factor modification recommended.  ASA added for secondary stroke prevention and Augmentin added for odontogenic infection.  Work-up underway and CIR recommended due to functional deficits  Review of Systems  Constitutional: Negative for chills and fever.  HENT: Negative for tinnitus.   Eyes: Negative for blurred vision and double vision.  Respiratory: Negative for shortness of breath.   Cardiovascular: Negative for chest pain and palpitations.  Gastrointestinal: Negative for constipation, heartburn and nausea.  Genitourinary: Negative for  dysuria and urgency.       Incontinence  Musculoskeletal: Negative for back pain and myalgias.  Skin: Negative for itching and rash.  Neurological: Positive for speech change and focal weakness. Negative for sensory change.  Psychiatric/Behavioral: Positive for depression. The patient does not have insomnia.   All other systems reviewed and are negative.    Past Medical History:  Diagnosis Date  . Anxiety   . Arthritis   . Asthma   . Bowel obstruction ([place])   . Chronic pain   . COPD (chronic obstructive pulmonary disease) ([place])   . Depression   . GERD (gastroesophageal reflux disease)   . Hypertension     Past Surgical History:  Procedure Laterality Date  . ABDOMINAL SURGERY  2003   blockage  . left salpingectomy    . uterine ablation      Family History  Problem Relation Age of Onset  . Heart disease Mother   . Heart disease Father     Social History:  Single. Works for [employer]. She reports that she has been smoking cigarettes--1/2 PPD. She has a 10.00 pack-year smoking history. She has never used smokeless tobacco. She reports that she drinks alcohol--1-2 beers daily.  She reports that she does not use drugs.    Allergies  Allergen Reactions  . Amlodipine Other (See Comments)    Dehydration     Medications Prior to Admission  Medication Sig Dispense Refill  . albuterol (PROVENTIL HFA;VENTOLIN HFA) 108 (90 BASE) MCG/ACT inhaler Inhale 2 puffs into the lungs every 6 (six) hours as needed. Shortness of breath    . budesonide-formoterol (SYMBICORT) 160-4.5 MCG/ACT  inhaler Inhale 2 puffs into the lungs 2 (two) times daily.     Marland Kitchen aspirin EC 81 MG tablet Take 81 mg by mouth daily.    . clorazepate (TRANXENE) 7.5 MG tablet Take 7.5 mg by mouth 2 (two) times daily as needed for anxiety.    . gabapentin (NEURONTIN) 300 MG capsule Take 1 capsule (300 mg total) by mouth at bedtime. (Patient not taking: Reported on 06/12/2018) 30 capsule 1  . hydrochlorothiazide  (HYDRODIURIL) 12.5 MG tablet TK 1 T PO BID  3  . meloxicam (MOBIC) 15 MG tablet Take 1 tablet (15 mg total) by mouth daily. (Patient not taking: Reported on 06/12/2018) 30 tablet 5  . montelukast (SINGULAIR) 10 MG tablet Take 10 mg by mouth at bedtime.      . verapamil (CALAN) 120 MG tablet Take 1 tablet by mouth 3 (three) times daily.  0    Home: Home Living Family/patient expects to be discharged to:: Unsure Living Arrangements: Alone Available Help at Discharge: Family Type of Home: Mobile home Home Access: Stairs to enter Technical brewer of Steps: 4 Entrance Stairs-Rails: Can reach both Home Layout: One level Biochemist, clinical: Standard Bathroom Accessibility: Yes Home Equipment: None  Functional History: Prior Function Level of Independence: Independent Comments: Patient employed at [employer] and works on [employer], she lives alone and is independent with all ADL's, IADL,s and funcitonal mobility Functional Status:  Mobility: Bed Mobility Overal bed mobility: Modified Independent General bed mobility comments: very slow labored movement having to use LUE to move right arm and leg Transfers Overall transfer level: Needs assistance Equipment used: Straight cane, 1 person hand held assist Transfers: Sit to/from Stand, Stand Pivot Transfers Sit to Stand: Min assist Stand pivot transfers: Mod assist General transfer comment: frequent buckling of RLE, requires hands on assist to maintain standing balance Ambulation/Gait Ambulation/Gait assistance: Max assist Gait Distance (Feet): 8 Feet Assistive device: 1 person hand held assist, Straight cane Gait Pattern/deviations: Step-to pattern, Decreased step length - right, Decreased stance time - right, Decreased stride length General Gait Details: limited to 7-8 steps at bedside with frequent buckling of RLE, requires tactile assist to advance RLE due to weakness, frequent falling over to the right, poor right hand grip strength  for using RW Gait velocity: slow    ADL: ADL Overall ADL's : Needs assistance/impaired Eating/Feeding: Bed level, Minimal assistance Eating/Feeding Details (indicate cue type and reason): to open containers and packages. Patient wears dentures.  Grooming: Wash/dry face, Oral care, Set up, Sitting Grooming Details (indicate cue type and reason): Pt requiring assistance to take top off and put on toothpaste, seemingly unable to use right hand to assist, OT assisting for lateral pinch to hold toothpast. Pt using soley left hand for tasks.  Lower Body Dressing: Moderate assistance, Sitting/lateral leans Lower Body Dressing Details (indicate cue type and reason): pt able to doff socks with left hand, unable to use RUE to assist to donning gripper socks. OT assisting with donning socks Toilet Transfer: Min guard, Ambulation, BSC General ADL Comments: Pt unable to perform tasks in standing due to RLE buckling, not using RUE during session  Cognition: Cognition Overall Cognitive Status: Within Functional Limits for tasks assessed Arousal/Alertness: Awake/alert Orientation Level: Oriented X4 Awareness: Impaired Problem Solving: Impaired Problem Solving Impairment: Verbal basic Executive Function: Reasoning, Organizing Reasoning: Impaired Reasoning Impairment: Verbal basic Organizing: Impaired Organizing Impairment: Verbal basic Behaviors: Impulsive, Poor frustration tolerance Safety/Judgment: Impaired Cognition Arousal/Alertness: Awake/alert Behavior During Therapy: WFL for tasks assessed/performed Overall Cognitive  Status: Within Functional Limits for tasks assessed  Blood pressure (!) 147/119, pulse 67, temperature 97.9 F (36.6 C), temperature source Oral, resp. rate 16, height 5\' 6"  (1.676 m), weight 58.2 kg, SpO2 99 %. Physical Exam  Vitals reviewed. Constitutional: She is oriented to person, place, and time. She appears well-developed and well-nourished.  HENT:  Head:  Normocephalic and atraumatic.  Eyes: EOM are normal. Right eye exhibits no discharge. Left eye exhibits no discharge.  Neck: Normal range of motion. Neck supple.  Cardiovascular: Normal rate and regular rhythm.  Respiratory: Effort normal and breath sounds normal.  GI: Soft. Bowel sounds are normal.  Musculoskeletal:  No edema or tenderness in extremities  Neurological: She is alert and oriented to person, place, and time.  Moderate dysarthria.  Right hemiparesis with sensory deficits.  Motor: Right upper extremity: Trace with apraxia proximal to distal Right lower extremity: 0/5 proximal distal Left upper extremity/left lower extremity: 5/5 proximal distal Sensation intact light touch Facial weakness  Skin: Skin is warm and dry.  Psychiatric: She has a normal mood and affect. Her behavior is normal.    Results for orders placed or performed during the hospital encounter of 06/12/18 (from the past 24 hour(s))  Basic metabolic panel     Status: None   Collection Time: 06/15/18 11:04 AM  Result Value Ref Range   Sodium 140 135 - 145 mmol/L   Potassium 3.8 3.5 - 5.1 mmol/L   Chloride 108 98 - 111 mmol/L   CO2 26 22 - 32 mmol/L   Glucose, Bld 95 70 - 99 mg/dL   BUN 17 6 - 20 mg/dL   Creatinine, Ser 0.71 0.44 - 1.00 mg/dL   Calcium 9.4 8.9 - 10.3 mg/dL   GFR calc non Af Amer >60 >60 mL/min   GFR calc Af Amer >60 >60 mL/min   Anion gap 6 5 - 15  CBC     Status: Abnormal   Collection Time: 06/15/18 11:04 AM  Result Value Ref Range   WBC 11.6 (H) 4.0 - 10.5 K/uL   RBC 4.48 3.87 - 5.11 MIL/uL   Hemoglobin 12.5 12.0 - 15.0 g/dL   HCT 41.5 36.0 - 46.0 %   MCV 92.6 80.0 - 100.0 fL   MCH 27.9 26.0 - 34.0 pg   MCHC 30.1 30.0 - 36.0 g/dL   RDW 12.6 11.5 - 15.5 %   Platelets 308 150 - 400 K/uL   nRBC 0.0 0.0 - 0.2 %   Ct Head Wo Contrast  Result Date: 06/15/2018 CLINICAL DATA:  Right hemiparesis. EXAM: CT HEAD WITHOUT CONTRAST TECHNIQUE: Contiguous axial images were obtained from  the base of the skull through the vertex without intravenous contrast. COMPARISON:  MRI of June 14, 2018.  CT scan of June 12, 2018. FINDINGS: Brain: New low density is seen in left pontine region consistent with acute infarction. Minimal chronic ischemic white matter disease is noted. No mass effect or midline shift is noted. Ventricular size is within normal limits. No hemorrhage or evidence of mass lesion is noted. Vascular: No hyperdense vessel or unexpected calcification. Skull: Normal. Negative for fracture or focal lesion. Sinuses/Orbits: No acute finding. Other: None. IMPRESSION: Left pontine low density is noted consistent with acute infarction described on prior MRI. No other significant changes noted compared to prior exam. Electronically Signed   By: Marijo Conception, M.D.   On: 06/15/2018 11:33    Assessment/Plan: Diagnosis: Multiple left infarcts. Labs and images (see above) independently reviewed.  Records reviewed and summated above. Stroke: Continue secondary stroke prophylaxis and Risk Factor Modification listed below:   Antiplatelet therapy:   Blood Pressure Management:  Continue current medication with prn's with permisive HTN per primary team Statin Agent:   Tobacco abuse:   Right sided hemiparesis: fit for orthosis to prevent contractures (resting hand splint for day, wrist cock up splint at night, PRAFO, etc) Motor recovery: Fluoxetine  1. Does the need for close, 24 hr/day medical supervision in concert with the patient's rehab needs make it unreasonable for this patient to be served in a less intensive setting? Yes  2. Co-Morbidities requiring supervision/potential complications: COPD (monitor respiratory rate and O2 sats with increased physical exertion), HTN (monitor and provide prns in accordance with increased physical exertion and pain), tobacco use (counsel), medication noncompliance (counsel), chronic pain (Biofeedback training with therapies to help reduce  reliance on opiate pain medications, monitor pain control during therapies, and sedation at rest and titrate to maximum efficacy to ensure participation and gains in therapies), leukocytosis (repeat labs, cont to monitor for signs and symptoms of infection, further workup if indicated) 3. Due to safety, disease management and patient education, does the patient require 24 hr/day rehab nursing? Yes 4. Does the patient require coordinated care of a physician, rehab nurse, PT (1-2 hrs/day, 5 days/week), OT (1-2 hrs/day, 5 days/week) and SLP (1-2 hrs/day, 5 days/week) to address physical and functional deficits in the context of the above medical diagnosis(es)? Yes Addressing deficits in the following areas: balance, endurance, locomotion, strength, transferring, bathing, dressing, toileting, speech and psychosocial support 5. Can the patient actively participate in an intensive therapy program of at least 3 hrs of therapy per day at least 5 days per week? Yes 6. The potential for patient to make measurable gains while on inpatient rehab is excellent 7. Anticipated functional outcomes upon discharge from inpatient rehab are min assist  with PT, min assist with OT, n/a with SLP. 8. Estimated rehab length of stay to reach the above functional goals is: 17-22 days. 9. Anticipated D/C setting: Other 10. Anticipated post D/C treatments: HH therapy and Home excercise program 11. Overall Rehab/Functional Prognosis: good  RECOMMENDATIONS: This patient's condition is appropriate for continued rehabilitative care in the following setting: CIR if caregiver support available upon discharge. Patient has agreed to participate in recommended program. Potentially Note that insurance prior authorization may be required for reimbursement for recommended care.  Comment: Rehab Admissions Coordinator to follow up.   I have personally performed a face to face diagnostic evaluation, including, but not limited to relevant  history and physical exam findings, of this patient and developed relevant assessment and plan.  Additionally, I have reviewed and concur with the physician assistant's documentation above.   Delice Lesch, MD, ABPMR Bary Leriche, PA-C 06/16/2018

## 2018-06-16 NOTE — Progress Notes (Signed)
PT Progress Note for Charges    06/16/18 1300  PT General Charges  $$ ACUTE PT VISIT 1 Visit  PT Treatments  $Therapeutic Activity 23-37 mins  Sherie Don, [place], DPT  Acute Rehabilitation Services Pager [phone] Office [phone]

## 2018-06-16 NOTE — Progress Notes (Addendum)
STROKE TEAM PROGRESS NOTE   INTERVAL HISTORY Patient was transferred from [facility] for worsening of right hemiplegia.on 06/15/18 She remains unchanged. MRI shows large left paramedian pontine lacunar infarct. Patient gives history of sleep apnea and snoring. She could not afford CPAP machine and is interested in trying to participate in the sleep smart stroke prevention study   Vitals:   06/16/18 0420 06/16/18 0622 06/16/18 0726 06/16/18 0800  BP: 139/79 138/74 (!) 146/83 (!) 147/119  Pulse: 65 71 61 67  Resp: 18 18 20    Temp:  97.7 F (36.5 C) 97.9 F (36.6 C) 97.9 F (36.6 C)  TempSrc:  Oral Oral Oral  SpO2: 95% 98% 98% 100%  Weight:      Height:        CBC:  Recent Labs  Lab 06/12/18 1451 06/12/18 1500 06/15/18 1104  WBC 12.4*  --  11.6*  NEUTROABS 6.8  --   --   HGB 14.2 15.3* 12.5  HCT 46.1* 45.0 41.5  MCV 90.2  --  92.6  PLT 351  --  867    Basic Metabolic Panel:  Recent Labs  Lab 06/12/18 1451 06/12/18 1500 06/15/18 1104  NA 139 141 140  K 3.9 3.9 3.8  CL 106 107 108  CO2 25  --  26  GLUCOSE 86 82 95  BUN 15 16 17   CREATININE 0.80 0.80 0.71  CALCIUM 9.4  --  9.4   Lipid Panel:     Component Value Date/Time   CHOL 222 (H) 06/13/2018 0613   TRIG 131 06/13/2018 0613   HDL 53 06/13/2018 0613   CHOLHDL 4.2 06/13/2018 0613   VLDL 26 06/13/2018 0613   LDLCALC 143 (H) 06/13/2018 0613   HgbA1c:  Lab Results  Component Value Date   HGBA1C 4.6 (L) 06/13/2018   Urine Drug Screen: No results found for: LABOPIA, COCAINSCRNUR, LABBENZ, AMPHETMU, THCU, LABBARB  Alcohol Level No results found for: [facility]  IMAGING Ct Head Wo Contrast  Result Date: 06/15/2018 CLINICAL DATA:  Right hemiparesis. EXAM: CT HEAD WITHOUT CONTRAST TECHNIQUE: Contiguous axial images were obtained from the base of the skull through the vertex without intravenous contrast. COMPARISON:  MRI of June 14, 2018.  CT scan of June 12, 2018. FINDINGS: Brain: New low density is  seen in left pontine region consistent with acute infarction. Minimal chronic ischemic white matter disease is noted. No mass effect or midline shift is noted. Ventricular size is within normal limits. No hemorrhage or evidence of mass lesion is noted. Vascular: No hyperdense vessel or unexpected calcification. Skull: Normal. Negative for fracture or focal lesion. Sinuses/Orbits: No acute finding. Other: None. IMPRESSION: Left pontine low density is noted consistent with acute infarction described on prior MRI. No other significant changes noted compared to prior exam. Electronically Signed   By: Marijo Conception, M.D.   On: 06/15/2018 11:33   Mr Brain Wo Contrast  Addendum Date: 06/14/2018   ADDENDUM REPORT: 06/14/2018 11:33 ADDENDUM: I spoke to the patient's Nurse Megan by phone at 1124 hours. And the study was discussed by telephone with Dr. Carles Collet on 06/14/2018 at 1130 hours. Electronically Signed   By: Genevie Ann M.D.   On: 06/14/2018 11:33   Result Date: 06/14/2018 CLINICAL DATA:  57 year old female with right facial droop and weakness for 3 days. EXAM: MRI HEAD WITHOUT CONTRAST MRA HEAD WITHOUT CONTRAST TECHNIQUE: Multiplanar, multiecho pulse sequences of the brain and surrounding structures were obtained without intravenous contrast. Angiographic images of  the head were obtained using MRA technique without contrast. COMPARISON:  CTA neck yesterday and noncontrast head CT 06/12/2018. FINDINGS: MRI HEAD FINDINGS Brain: Densely restricted diffusion in the left pons in an area of 12-14 millimeters as seen on series 4, image 75. Associated T2 and FLAIR hyperintensity with mild pontine expansion, but no hemorrhage or significant regional mass effect. Asymmetric curvilinear signal abnormality on trace diffusion in the medial left thalamus seen on series 483, but ADC demonstrates little if any restriction. No restricted diffusion elsewhere. Chronic lacunar infarcts in the bilateral basal ganglia most pronounced at  the left caudate corresponding to the plain head CT finding on 06/12/2018. Small chronic bilateral thalamic lacune is. Patchy T2 and FLAIR hyperintensity in the right pons superimposed on the acute left pontine finding. No cortical encephalomalacia or chronic cerebral blood products identified. No midline shift, mass effect, evidence of mass lesion, ventriculomegaly, extra-axial collection or acute intracranial hemorrhage. Cervicomedullary junction and pituitary are within normal limits. Vascular: Major intracranial vascular flow voids are preserved. Skull and upper cervical spine: Negative visible cervical spine. Visualized bone marrow signal is within normal limits. Sinuses/Orbits: Normal orbits soft tissues. Paranasal Visualized paranasal sinuses and mastoids are stable and well pneumatized. Other: Grossly normal visible internal auditory structures. Scalp and visible face soft tissues appear negative. MRA HEAD FINDINGS Antegrade flow in the posterior circulation with no distal vertebral artery stenosis. Mildly dominant distal left vertebral artery. Patent PICA origins and vertebrobasilar junction. Patent basilar artery without irregularity or stenosis. Normal SCA and left PCA origins. Fetal right PCA origin. Left posterior communicating artery diminutive or absent. Bilateral PCA branches are within normal limits. Antegrade flow in both ICA siphons. Mild siphon irregularity but no siphon stenosis. Arising at the right posterior communicating artery origin there is a lobulated inferiorly and slightly laterally and anteriorly directed aneurysm measuring 5 x 6 millimeters (series 8, image 54 and series 103, image 24). The right Pcomm seems to arise from the aneurysm as demonstrated on image 54. Otherwise normal carotid termini. The MCA and ACA origins are within normal limits. The right A1 is mildly dominant. The anterior communicating artery may be fenestrated. The ACA branches are tortuous but otherwise within  normal limits. Left MCA M1 segment, left MCA bifurcation, and left MCA branches are within normal limits. The right M1 bifurcates early. The right MCA branches are within normal limits. IMPRESSION: 1. 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 2. 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 Unfortunately, the right Pcomm which is a fetal origin of the Right PCA origin to arise directly from the aneurysm. Neuro-Interventional Radiology consultation is suggested to evaluate the appropriateness of potential treatment. Non-emergent evaluation can be arranged by calling [phone] during usual hours. Emergency evaluation can be requested by paging [phone]. 3. Subacute lacunar infarct of the left thalamus, with underlying moderately advanced bilateral deep gray matter small vessel ischemia. 4. Intracranial MRA is negative for intracranial stenosis or branch occlusion. Electronically Signed: By: Genevie Ann M.D. On: 06/14/2018 09:58   Mr Jodene Nam Head/brain PR Cm  Addendum Date: 06/14/2018   ADDENDUM REPORT: 06/14/2018 11:33 ADDENDUM: I spoke to the patient's Nurse Megan by phone at 1124 hours. And the study was discussed by telephone with Dr. Carles Collet on 06/14/2018 at 1130 hours. Electronically Signed   By: Genevie Ann M.D.   On: 06/14/2018 11:33   Result Date: 06/14/2018 CLINICAL  DATA:  57 year old female with right facial droop and weakness for 3 days. EXAM: MRI HEAD WITHOUT CONTRAST MRA HEAD WITHOUT CONTRAST TECHNIQUE: Multiplanar, multiecho pulse sequences of the brain and surrounding structures were obtained without intravenous contrast. Angiographic images of the head were obtained using MRA technique without contrast. COMPARISON:  CTA neck yesterday and noncontrast head CT 06/12/2018. FINDINGS: MRI HEAD FINDINGS Brain: Densely restricted diffusion in the left pons in an  area of 12-14 millimeters as seen on series 4, image 75. Associated T2 and FLAIR hyperintensity with mild pontine expansion, but no hemorrhage or significant regional mass effect. Asymmetric curvilinear signal abnormality on trace diffusion in the medial left thalamus seen on series 483, but ADC demonstrates little if any restriction. No restricted diffusion elsewhere. Chronic lacunar infarcts in the bilateral basal ganglia most pronounced at the left caudate corresponding to the plain head CT finding on 06/12/2018. Small chronic bilateral thalamic lacune is. Patchy T2 and FLAIR hyperintensity in the right pons superimposed on the acute left pontine finding. No cortical encephalomalacia or chronic cerebral blood products identified. No midline shift, mass effect, evidence of mass lesion, ventriculomegaly, extra-axial collection or acute intracranial hemorrhage. Cervicomedullary junction and pituitary are within normal limits. Vascular: Major intracranial vascular flow voids are preserved. Skull and upper cervical spine: Negative visible cervical spine. Visualized bone marrow signal is within normal limits. Sinuses/Orbits: Normal orbits soft tissues. Paranasal Visualized paranasal sinuses and mastoids are stable and well pneumatized. Other: Grossly normal visible internal auditory structures. Scalp and visible face soft tissues appear negative. MRA HEAD FINDINGS Antegrade flow in the posterior circulation with no distal vertebral artery stenosis. Mildly dominant distal left vertebral artery. Patent PICA origins and vertebrobasilar junction. Patent basilar artery without irregularity or stenosis. Normal SCA and left PCA origins. Fetal right PCA origin. Left posterior communicating artery diminutive or absent. Bilateral PCA branches are within normal limits. Antegrade flow in both ICA siphons. Mild siphon irregularity but no siphon stenosis. Arising at the right posterior communicating artery origin there is a  lobulated inferiorly and slightly laterally and anteriorly directed aneurysm measuring 5 x 6 millimeters (series 8, image 54 and series 103, image 24). The right Pcomm seems to arise from the aneurysm as demonstrated on image 54. Otherwise normal carotid termini. The MCA and ACA origins are within normal limits. The right A1 is mildly dominant. The anterior communicating artery may be fenestrated. The ACA branches are tortuous but otherwise within normal limits. Left MCA M1 segment, left MCA bifurcation, and left MCA branches are within normal limits. The right M1 bifurcates early. The right MCA branches are within normal limits. IMPRESSION: 1. 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 2. 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 Unfortunately, the right Pcomm which is a fetal origin of the Right PCA origin to arise directly from the aneurysm. Neuro-Interventional Radiology consultation is suggested to evaluate the appropriateness of potential treatment. Non-emergent evaluation can be arranged by calling [phone] during usual hours. Emergency evaluation can be requested by paging [phone]. 3. Subacute lacunar infarct of the left thalamus, with underlying moderately advanced bilateral deep gray matter small vessel ischemia. 4. Intracranial MRA is negative for intracranial stenosis or branch occlusion. Electronically Signed: By: Genevie Ann M.D. On: 06/14/2018 09:58    2D Echocardiogram  - Left ventricle: The cavity size was normal. Wall thickness was increased in a pattern of moderate LVH.  Systolic function was normal. The estimated ejection fraction was in the range of 60% to 65%. Wall motion was normal; there were no regional wall motion abnormalities. Doppler parameters are consistent with abnormal left ventricular relaxation (grade 1 diastolic  dysfunction). - Aortic valve: Valve area (VTI): 2 cm^2. Valve area (Vmax): 2.21 cm^2. Valve area (Vmean): 2.11 cm^2. - Left atrium: The atrium was mildly dilated. - Technically adequate study.   PHYSICAL EXAM Obese middle-aged African-American lady not in distress. . Afebrile. Head is nontraumatic. Neck is supple without bruit.    Cardiac exam no murmur or gallop. Lungs are clear to auscultation. Distal pulses are well felt. Neurological Exam :  Awake alert oriented 3. Dysarthric speech but can be understood. No paraphasic errors. No aphasia. Extraocular moments are full range without nystagmus. She blinks to threat bilaterally. Moderate right lower facial weakness. Tongue midline. Significant right hemiplegia with 2/5 right upper extremity and right lower extremity strength. Symmetric touch pinprick sensation bilaterally. Deep tendon reflexes are brisker on the right than the left. Plantars are downgoing. Gait not tested.  NIH stroke scale 7 MRS 4 ASSESSMENT/PLAN Judy Boyd is a 57 y.o. female with history of hypertension, anxiety, depression, COPD, GERD presenting with R facial weakness, dysarthria and R hemiparesis who was admitted to [facility] 10/26 with 2 day hx of symptoms, non complaint with meds PTA. Transferred to [facility] 10/29 with worsened hemiparesis..   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  CTA neck 10/27 R mandible odontogenic infection w/ extensive dental dz and submandibular lymphadenopathy. R thyroid nodules.  Aortic atherosclerosis. Emphysema.    MRI  / MRA  10/28 L pontine infarct. R PComA aneurysm 5x52mm. Subacute L thalamic infarct. Small vessel disease. Atrophy.Marland Kitchen MRA neg.   CT head 10/29 L pontine infarct  MRI 10/30 L thalamic infarct. Evolved L pontine infarct  2D Echo  EF 60-65%. No source of embolus   LDL 143  HgbA1c 4.6  Lovenox 40 mg sq daily for VTE  prophylaxis  Non compliant with aspirin 81 mg daily prior to admission, now on aspirin 325 mg daily. Given mild stroke, recommend aspirin 81 mg and plavix 75 mg daily x 3 weeks, then aspirin alone. Orders adjusted.   Therapy recommendations:  CIR  Disposition:  pending  NOTHING FURTHER TO ADD FROM THE STROKE STANDPOINT Stroke Service will sign off. Please call should any needs arise. Follow-up Stroke Clinic at [facility] in 4 weeks after d/c. Office will call with appointment date and time. Order placed.  Hypertensive URgency  Elevated on arrival [phone]  Non compliant w/ home meds  BP improving . Permissive hypertension (OK if < 220/120) but gradually normalize in 5-7 days . Long-term BP goal normotensive  Hyperlipidemia  Home meds:  No statin  Now on lipitor 40  LDL 143, goal < 70  Continue statin at discharge  Other Stroke Risk Factors  Cigarette smoker, advised to stop smoking  ETOH use, advised to drink no more than 1 drink(s) a day  Other Active Problems  COPD  Odontogenic infection, on Augmentin  Thyroid nodule  Hospital day # [place], MSN, APRN, ANVP-BC, AGPCNP-BC Advanced Practice Stroke Nurse [place] for Schedule & Pager information 06/16/2018 8:20 AM  I have personally examined this patient, reviewed notes, independently viewed imaging studies, participated in medical decision making and plan of care.ROS completed by  me personally and pertinent positives fully documented  I have made any additions or clarifications directly to the above note. Agree with note above. He is tolerating with a pontine infarct and unfortunately had clinical worsening. Recommend dual antiplatelet therapy for 3 weeks followed by aspirin alone. Aggressive risk factor modification. Continue ongoing physical therapy and transferred to inpatient rehabilitation when bed available. She may also consider possible participation in  the sleep smart stroke study if interested. Greater than 50% time during this 35 minute visit was spent on counseling and coordination of care about her lacunar infarct, discussion about stroke risk factors and stroke study and answering questions  Antony Contras, MD Medical Director [place] Pager: [phone] 06/16/2018 4:54 PM  To contact Stroke Continuity provider, please refer to [email or web address] After hours, contact General Neurology

## 2018-06-17 ENCOUNTER — Inpatient Hospital Stay (HOSPITAL_COMMUNITY)
Admission: RE | Admit: 2018-06-17 | Discharge: 2018-07-01 | DRG: 057 | Disposition: A | Discharge status: Home / Self Care | Payer: Medicaid Other | Source: Intra-hospital | Attending: Physical Medicine & Rehabilitation | Admitting: Physical Medicine & Rehabilitation

## 2018-06-17 ENCOUNTER — Encounter (HOSPITAL_COMMUNITY): Payer: Self-pay

## 2018-06-17 ENCOUNTER — Other Ambulatory Visit: Payer: Self-pay

## 2018-06-17 DIAGNOSIS — F17210 Nicotine dependence, cigarettes, uncomplicated: Secondary | ICD-10-CM | POA: Diagnosis present

## 2018-06-17 DIAGNOSIS — Z7951 Long term (current) use of inhaled steroids: Secondary | ICD-10-CM

## 2018-06-17 DIAGNOSIS — K219 Gastro-esophageal reflux disease without esophagitis: Secondary | ICD-10-CM | POA: Diagnosis present

## 2018-06-17 DIAGNOSIS — Z7902 Long term (current) use of antithrombotics/antiplatelets: Secondary | ICD-10-CM | POA: Diagnosis not present

## 2018-06-17 DIAGNOSIS — I69351 Hemiplegia and hemiparesis following cerebral infarction affecting right dominant side: Principal | ICD-10-CM

## 2018-06-17 DIAGNOSIS — K0530 Chronic periodontitis, unspecified: Secondary | ICD-10-CM | POA: Diagnosis present

## 2018-06-17 DIAGNOSIS — J449 Chronic obstructive pulmonary disease, unspecified: Secondary | ICD-10-CM | POA: Diagnosis present

## 2018-06-17 DIAGNOSIS — I10 Essential (primary) hypertension: Secondary | ICD-10-CM | POA: Diagnosis present

## 2018-06-17 DIAGNOSIS — K029 Dental caries, unspecified: Secondary | ICD-10-CM | POA: Diagnosis present

## 2018-06-17 DIAGNOSIS — I69392 Facial weakness following cerebral infarction: Secondary | ICD-10-CM | POA: Diagnosis not present

## 2018-06-17 DIAGNOSIS — G894 Chronic pain syndrome: Secondary | ICD-10-CM | POA: Diagnosis present

## 2018-06-17 DIAGNOSIS — D649 Anemia, unspecified: Secondary | ICD-10-CM | POA: Diagnosis present

## 2018-06-17 DIAGNOSIS — D72829 Elevated white blood cell count, unspecified: Secondary | ICD-10-CM | POA: Diagnosis present

## 2018-06-17 DIAGNOSIS — Z7982 Long term (current) use of aspirin: Secondary | ICD-10-CM

## 2018-06-17 DIAGNOSIS — F444 Conversion disorder with motor symptom or deficit: Secondary | ICD-10-CM | POA: Diagnosis not present

## 2018-06-17 DIAGNOSIS — Z79899 Other long term (current) drug therapy: Secondary | ICD-10-CM

## 2018-06-17 DIAGNOSIS — K045 Chronic apical periodontitis: Secondary | ICD-10-CM | POA: Diagnosis present

## 2018-06-17 DIAGNOSIS — J441 Chronic obstructive pulmonary disease with (acute) exacerbation: Secondary | ICD-10-CM | POA: Diagnosis present

## 2018-06-17 DIAGNOSIS — I639 Cerebral infarction, unspecified: Secondary | ICD-10-CM | POA: Diagnosis not present

## 2018-06-17 DIAGNOSIS — Z720 Tobacco use: Secondary | ICD-10-CM | POA: Diagnosis present

## 2018-06-17 DIAGNOSIS — K089 Disorder of teeth and supporting structures, unspecified: Secondary | ICD-10-CM

## 2018-06-17 MED ORDER — FLEET ENEMA 7-19 GM/118ML RE ENEM: 1.0000 | ENEMA | Freq: Once | RECTAL | Status: DC | PRN

## 2018-06-17 MED ORDER — ALBUTEROL SULFATE (2.5 MG/3ML) 0.083% IN NEBU: 3.0000 mL | INHALATION_SOLUTION | Freq: Four times a day (QID) | RESPIRATORY_TRACT | Status: DC | PRN

## 2018-06-17 MED ORDER — ATORVASTATIN CALCIUM 40 MG PO TABS
40.0000 mg | ORAL_TABLET | Freq: Every day | ORAL | Status: DC
  Administered 2018-06-17 – 2018-06-30 (×14): 40 mg via ORAL
  Filled 2018-06-17 (×14): qty 1

## 2018-06-17 MED ORDER — CLOPIDOGREL BISULFATE 75 MG PO TABS
75.0000 mg | ORAL_TABLET | Freq: Every day | ORAL | Status: DC
  Administered 2018-06-18 – 2018-07-01 (×14): 75 mg via ORAL
  Filled 2018-06-17 (×14): qty 1

## 2018-06-17 MED ORDER — NICOTINE 14 MG/24HR TD PT24
14.0000 mg | MEDICATED_PATCH | Freq: Every day | TRANSDERMAL | Status: DC
  Administered 2018-06-18 – 2018-07-01 (×14): 14 mg via TRANSDERMAL
  Filled 2018-06-17 (×14): qty 1

## 2018-06-17 MED ORDER — DIPHENHYDRAMINE HCL 12.5 MG/5ML PO ELIX: 12.5000 mg | ORAL_SOLUTION | Freq: Four times a day (QID) | ORAL | Status: DC | PRN

## 2018-06-17 MED ORDER — MONTELUKAST SODIUM 10 MG PO TABS
10.0000 mg | ORAL_TABLET | Freq: Every day | ORAL | Status: DC
  Administered 2018-06-17 – 2018-06-30 (×14): 10 mg via ORAL
  Filled 2018-06-17 (×14): qty 1

## 2018-06-17 MED ORDER — PROCHLORPERAZINE 25 MG RE SUPP: 12.5000 mg | Freq: Four times a day (QID) | RECTAL | Status: DC | PRN

## 2018-06-17 MED ORDER — PROCHLORPERAZINE MALEATE 5 MG PO TABS: 5.0000 mg | ORAL_TABLET | Freq: Four times a day (QID) | ORAL | Status: DC | PRN

## 2018-06-17 MED ORDER — ATORVASTATIN CALCIUM 40 MG PO TABS: 40.0000 mg | ORAL_TABLET | Freq: Every day | ORAL | 0 refills | Status: DC

## 2018-06-17 MED ORDER — AMOXICILLIN-POT CLAVULANATE 875-125 MG PO TABS
1.0000 | ORAL_TABLET | Freq: Two times a day (BID) | ORAL | Status: AC
  Administered 2018-06-17 – 2018-06-30 (×26): 1 via ORAL
  Filled 2018-06-17 (×26): qty 1

## 2018-06-17 MED ORDER — POLYETHYLENE GLYCOL 3350 17 G PO PACK
17.0000 g | PACK | Freq: Every day | ORAL | Status: DC
  Administered 2018-06-22 – 2018-06-23 (×2): 17 g via ORAL
  Filled 2018-06-17 (×12): qty 1

## 2018-06-17 MED ORDER — ASPIRIN EC 81 MG PO TBEC
81.0000 mg | DELAYED_RELEASE_TABLET | Freq: Every day | ORAL | Status: DC
  Administered 2018-06-18 – 2018-07-01 (×14): 81 mg via ORAL
  Filled 2018-06-17 (×14): qty 1

## 2018-06-17 MED ORDER — ALUM & MAG HYDROXIDE-SIMETH 200-200-20 MG/5ML PO SUSP: 30.0000 mL | ORAL | Status: DC | PRN

## 2018-06-17 MED ORDER — CLOPIDOGREL BISULFATE 75 MG PO TABS: 75.0000 mg | ORAL_TABLET | Freq: Every day | ORAL | Status: DC

## 2018-06-17 MED ORDER — ENOXAPARIN SODIUM 40 MG/0.4ML ~~LOC~~ SOLN
40.0000 mg | SUBCUTANEOUS | Status: DC
  Administered 2018-06-17 – 2018-06-30 (×14): 40 mg via SUBCUTANEOUS
  Filled 2018-06-17 (×14): qty 0.4

## 2018-06-17 MED ORDER — POLYETHYLENE GLYCOL 3350 17 G PO PACK: 17.0000 g | PACK | Freq: Every day | ORAL | Status: DC | PRN

## 2018-06-17 MED ORDER — POLYETHYLENE GLYCOL 3350 17 G PO PACK: 17.0000 g | PACK | Freq: Every day | ORAL | 0 refills | Status: DC

## 2018-06-17 MED ORDER — PRO-STAT SUGAR FREE PO LIQD
30.0000 mL | Freq: Two times a day (BID) | ORAL | Status: DC
  Administered 2018-06-17 – 2018-06-30 (×21): 30 mL via ORAL
  Filled 2018-06-17 (×29): qty 30

## 2018-06-17 MED ORDER — ACETAMINOPHEN 325 MG PO TABS: 650.0000 mg | ORAL_TABLET | ORAL | Status: DC | PRN

## 2018-06-17 MED ORDER — PROCHLORPERAZINE EDISYLATE 10 MG/2ML IJ SOLN: 5.0000 mg | Freq: Four times a day (QID) | INTRAMUSCULAR | Status: DC | PRN

## 2018-06-17 MED ORDER — GUAIFENESIN-DM 100-10 MG/5ML PO SYRP: 5.0000 mL | ORAL_SOLUTION | Freq: Four times a day (QID) | ORAL | Status: DC | PRN

## 2018-06-17 MED ORDER — TRAZODONE HCL 50 MG PO TABS
25.0000 mg | ORAL_TABLET | Freq: Every evening | ORAL | Status: DC | PRN
  Administered 2018-06-23: 25 mg via ORAL
  Filled 2018-06-17 (×2): qty 1

## 2018-06-17 MED ORDER — ACETAMINOPHEN 325 MG PO TABS: 325.0000 mg | ORAL_TABLET | ORAL | Status: DC | PRN

## 2018-06-17 MED ORDER — MOMETASONE FURO-FORMOTEROL FUM 200-5 MCG/ACT IN AERO
2.0000 | INHALATION_SPRAY | Freq: Two times a day (BID) | RESPIRATORY_TRACT | Status: DC
  Administered 2018-06-17 – 2018-07-01 (×24): 2 via RESPIRATORY_TRACT
  Filled 2018-06-17 (×2): qty 8.8

## 2018-06-17 MED ORDER — BISACODYL 10 MG RE SUPP: 10.0000 mg | Freq: Every day | RECTAL | Status: DC | PRN

## 2018-06-17 MED ORDER — AMOXICILLIN-POT CLAVULANATE 875-125 MG PO TABS: 1.0000 | ORAL_TABLET | Freq: Two times a day (BID) | ORAL | 0 refills | Status: DC

## 2018-06-17 NOTE — H&P (Signed)
Physical Medicine and Rehabilitation Admission H&P        Chief Complaint  Patient presents with  . Stroke with functional deficits.       HPI:  Judy Boyd is a RH female with history of COPD, HTN, tobacco use who was admitted on 10/26-20 19 with 2-day history of right facial droop, dysarthria and right-sided weakness.  CT of head done revealing low attenuation in left caudate head likely due to remote infarct.  CTA head neck was negative for significant stenosis and incidental findings of soft tissue inflammation of right mandible as well as extensive dental disease and right thyroid nodule noted.  She was started on aspirin for secondary stroke prevention as well as Augmentin for odontogenic infection.  2D echo done showed revealing EF of 60 to 65% with moderate LVH and no thrombus or PFO.  On 06/15/2018 patient developed increasing right-sided weakness felt to be due to extension of stroke.  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  Neurology felt that patient with recent worsening due to mass/edema effect.  Stroke was felt to be due to small vessel disease and risk factor modification recommended.  Plavix added to aspirin for secondary stroke prevention with recommendations to continue Plavix for 3 weeks followed by aspirin alone.  Therapy evaluations done revealing functional deficits and CIR recommended for follow-up therapy     Review of Systems  Constitutional: Negative for chills and fever.  HENT: Negative for hearing loss and tinnitus.   Eyes: Negative for blurred vision and double vision.  Respiratory: Negative for cough and shortness of breath.   Cardiovascular: Negative for chest pain, palpitations and leg swelling.  Gastrointestinal:  Negative for abdominal pain, constipation and heartburn.  Genitourinary: Negative for dysuria and urgency.  Musculoskeletal: Negative for back pain and myalgias.  Skin: Negative for rash.  Neurological: Positive for speech change and focal weakness.  Psychiatric/Behavioral: The patient does not have insomnia.             Past Medical History:  Diagnosis Date  . Anxiety    . Arthritis    . Asthma    . Bowel obstruction ([place])    . Chronic pain    . COPD (chronic obstructive pulmonary disease) ([place])    . Depression    . GERD (gastroesophageal reflux disease)    . Hypertension             Past Surgical History:  Procedure Laterality Date  . ABDOMINAL SURGERY   2003    blockage  . left salpingectomy      . uterine ablation             Family History  Problem Relation Age of Onset  . Heart disease Mother    . Heart disease Father        Social History:  reports that she has been smoking cigarettes. She has a 10.00 pack-year smoking history. She has never used smokeless tobacco. She reports that she drinks alcohol. She reports that she does not use drugs.           Allergies  Allergen Reactions  . Amlodipine Other (See Comments)      Dehydration  Medications Prior to Admission  Medication Sig Dispense Refill  . albuterol (PROVENTIL HFA;VENTOLIN HFA) 108 (90 BASE) MCG/ACT inhaler Inhale 2 puffs into the lungs every 6 (six) hours as needed. Shortness of breath      . budesonide-formoterol (SYMBICORT) 160-4.5 MCG/ACT inhaler Inhale 2 puffs into the lungs 2 (two) times daily.       Marland Kitchen aspirin EC 81 MG tablet Take 81 mg by mouth daily.      . clorazepate (TRANXENE) 7.5 MG tablet Take 7.5 mg by mouth 2 (two) times daily as needed for anxiety.      . gabapentin (NEURONTIN) 300 MG capsule Take 1 capsule (300 mg total) by mouth at bedtime. (Patient not taking: Reported on 06/12/2018) 30 capsule 1  . hydrochlorothiazide (HYDRODIURIL) 12.5 MG tablet TK 1 T PO BID   3    . meloxicam (MOBIC) 15 MG tablet Take 1 tablet (15 mg total) by mouth daily. (Patient not taking: Reported on 06/12/2018) 30 tablet 5  . montelukast (SINGULAIR) 10 MG tablet Take 10 mg by mouth at bedtime.        . verapamil (CALAN) 120 MG tablet Take 1 tablet by mouth 3 (three) times daily.   0      Drug Regimen Review  Drug regimen was reviewed and remains appropriate with no significant issues identified   Home: Home Living Family/patient expects to be discharged to:: Unsure Living Arrangements: Alone Available Help at Discharge: Family Type of Home: Mobile home Home Access: Stairs to enter Technical brewer of Steps: 4 Entrance Stairs-Rails: Can reach both Home Layout: One level Biochemist, clinical: Standard Bathroom Accessibility: Yes Home Equipment: None   Functional History: Prior Function Level of Independence: Independent Comments: Patient employed at [employer] and works on [employer], she lives alone and is independent with all ADL's, IADL,s and funcitonal mobility   Functional Status:  Mobility: Bed Mobility Overal bed mobility: Needs Assistance Bed Mobility: Rolling, Supine to Sit Rolling: Modified independent (Device/Increase time) Supine to sit: Min assist General bed mobility comments: seated in chair upon entry  Transfers Overall transfer level: Needs assistance Equipment used: Standard walker Transfers: Sit to/from Stand, Stand Pivot Transfers Sit to Stand: Min assist Stand pivot transfers: Mod assist General transfer comment: Requires R knee blocking during sit to stand, mod assist for stand pivot with cueing for sequencing and safety due to decreased functional use of R side. Pt requiring VC/TC for safety and maintaining balance upon standing and sitting from chair. Ambulation/Gait Ambulation/Gait assistance: Max assist, +2 physical assistance Gait Distance (Feet): 8 Feet Assistive device: Rolling walker (2 wheeled) Gait Pattern/deviations: Step-to  pattern, Decreased step length - right, Decreased stance time - right, Decreased stride length, Decreased dorsiflexion - right, Decreased weight shift to right, Scissoring, Drifts right/left, Narrow base of support General Gait Details: threapist had to progress R LE and prevent R Knee from buckling during ambulation. Pt seemed impulsive and stated "I don't want to go in the hallway with the walker". R hand grip on RW improved once therapist placed hand upon standing. Pt requried multiple VC for safety with RW. Gait velocity: slow   ADL: ADL Overall ADL's : Needs assistance/impaired Eating/Feeding: Bed level, Minimal assistance Eating/Feeding Details (indicate cue type and reason): to open containers and packages. Patient wears dentures.  Grooming: Applying deodorant, Moderate assistance, Sitting Grooming Details (indicate cue type and reason): Pt requiring assistance to take top off and put on toothpaste, seemingly unable to use right hand to assist, OT assisting for  lateral pinch to hold toothpast. Pt using soley left hand for tasks.  Upper Body Bathing: Minimal assistance, Sitting Upper Body Bathing Details (indicate cue type and reason): min assist for L UE and at times supporting R UE for protection  Lower Body Bathing: Minimal assistance, +2 for safety/equipment, Sit to/from stand Lower Body Bathing Details (indicate cue type and reason): standing at sink with therapist supporting R knee, 0 hand support, min assist to wash buttocks, peri area and upper legs  Upper Body Dressing : Moderate assistance, Sitting Upper Body Dressing Details (indicate cue type and reason): to thread R UE and tie gown, pt able to recall hemi dressing technique but demonstrates poor carryover of technqiue Lower Body Dressing: Total assistance, Sit to/from stand Lower Body Dressing Details (indicate cue type and reason): pt requires total assist to don new brief in standing  Toilet Transfer: Stand-pivot, Moderate  assistance(t orecliner ) Toilet Transfer Details (indicate cue type and reason): mod assist to ascend and maintain balance during stand pivot towards L side, cueing for seqencing and safety Functional mobility during ADLs: Moderate assistance General ADL Comments: requires assist to block R knee throughout session in standing, non functional use of R UE during session    Cognition: Cognition Overall Cognitive Status: Impaired/Different from baseline Arousal/Alertness: Awake/alert Orientation Level: Oriented X4 Awareness: Impaired Problem Solving: Impaired Problem Solving Impairment: Verbal basic Executive Function: Reasoning, Organizing Reasoning: Impaired Reasoning Impairment: Verbal basic Organizing: Impaired Organizing Impairment: Verbal basic Behaviors: Impulsive, Poor frustration tolerance Safety/Judgment: Impaired Cognition Arousal/Alertness: Awake/alert Behavior During Therapy: Agitated, Flat affect, Impulsive Overall Cognitive Status: Impaired/Different from baseline Area of Impairment: Problem solving, Awareness Awareness: Emergent Problem Solving: Requires verbal cues, Slow processing     Blood pressure (!) 146/76, pulse 74, temperature 98.2 F (36.8 C), temperature source Oral, resp. rate 16, height '5\' 6"'$  (1.676 m), weight 58.2 kg, SpO2 100 %. Physical Exam  Nursing note and vitals reviewed. Constitutional: She is oriented to person, place, and time. She appears well-developed and well-nourished.  HENT:  Head: Normocephalic and atraumatic.  Neurological: She is alert and oriented to person, place, and time.  Right facial weakness with moderate dysarthria. Able to follow basic commands without difficulty.       Lab Results Last 48 Hours  No results found for this or any previous visit (from the past 48 hour(s)).    Imaging Results (Last 48 hours)  Mr Brain Wo Contrast   Result Date: 06/16/2018 CLINICAL DATA:  57 year old female with recent left pontine  infarct following presentation with right side weakness and facial droop. Subsequent encounter. EXAM: MRI HEAD WITHOUT CONTRAST TECHNIQUE: Multiplanar, multiecho pulse sequences of the brain and surrounding structures were obtained without intravenous contrast. COMPARISON:  [facility],The brain MRI and intracranial MRA 06/14/2018 FINDINGS: Brain: Evolved T2 hyperintensity in the left paracentral pons corresponding to the previously demonstrated pontine infarct. However, there is increased restricted diffusion in the left thalamus since 06/14/2018, now encompassing an area of 16 millimeters (series 3, image 26). With associated T2 hyperintensity. No other restricted diffusion. No associated hemorrhage or mass effect at those sites. Accidentally, axial T1 and FLAIR images were not obtained. Stable gray and white matter signal elsewhere, including chronic small vessel disease in the left basal ganglia and medial thalami. Susceptibility weighted images suggest occasional chronic microhemorrhage (right periventricular white matter series 8, image 57. No midline shift, mass effect, evidence of mass lesion, ventriculomegaly, extra-axial collection or acute intracranial hemorrhage. Cervicomedullary junction and pituitary are within normal limits.  Vascular: Major intracranial vascular flow voids are stable. Skull and upper cervical spine: C4-C5 and C5-C6 disc and endplate degeneration. No marrow edema or evidence of acute osseous abnormality. Sinuses/Orbits: Stable and negative. Other: Stable trace right mastoid fluid. Small right face benign sebaceous cysts suspected on series 7, image 6. IMPRESSION: 1. Progressed acute lacunar infarct in the left thalamus since 06/14/2018, which was only faint on that recent MRI and suspected to be subacute rather than acute at that time. 2. The Left Pontine infarct demonstrates expected evolution. 3. No associated hemorrhage or mass effect. Electronically Signed   By: Genevie Ann M.D.    On: 06/16/2018 13:01             Medical Problem List and Plan: 1.    Right hemiparesis, dysarthria secondary to left pontine and left thalamic infarcts, around 06/10/2018 aspirin and Plavix Issued rehab program in a.m. 2.  DVT Prophylaxis/Anticoagulation: Pharmaceutical: Lovenox 3. Pain Management: Tylenol 4. Mood: We will monitor, may need neuropsych consult, trazodone for sleep 5. Neuropsych: This patient is capable of making decisions on her own behalf. 6. Skin/Wound Care: No areas identified we will continue to monitor 7. Fluids/Electrolytes/Nutrition: Recheck C met, monitor I's and O's  8.  COPD Dulera inhaler 2 puffs twice a day, Singulair 10 mg at bedtime  9.  History of hypertension resume verapamil if pressures remain elevated 1 to 2 weeks post stroke     Post Admission Physician Evaluation: 1. Functional deficits secondary  to left pontine and left thalamic infarcts. 2. Patient admitted to receive collaborative, interdisciplinary care between the physiatrist, rehab nursing staff, and therapy team. 3. Patient's level of medical complexity and substantial therapy needs in context of that medical necessity cannot be provided at a lesser intensity of care. 4. Patient has experienced substantial functional loss from his/her baseline.Judging by the patient's diagnosis, physical exam, and functional history, the patient has potential for functional progress which will result in measurable gains while on inpatient rehab.  These gains will be of substantial and practical use upon discharge in facilitating mobility and self-care at the household level. 5. Physiatrist will provide 24 hour management of medical needs as well as oversight of the therapy plan/treatment and provide guidance as appropriate regarding the interaction of the two. 6. 24 hour rehab nursing will assist in the management of  bladder management, bowel management, safety, skin/wound care, disease management, medication  administration, pain management and patient education  and help integrate therapy concepts, techniques,education, etc. 7. PT will assess and treat for:  Marland Kitchen  Goals are: supervision. 8. OT will assess and treat for  .  Goals are: supervision.  9. SLP will assess and treat for  .  Goals are: independent with increased time. 10. Case Management and Social Worker will assess and treat for psychological issues and discharge planning. 11. Team conference will be held weekly to assess progress toward goals and to determine barriers to discharge. 12.  Patient will receive at least 3 hours of therapy per day at least 5 days per week. 13. ELOS and Prognosis: 18 to 21 days excellent  "I have personally performed a face to face diagnostic evaluation of this patient.  Additionally, I have reviewed and concur with the physician assistant's documentation above."  Charlett Blake M.D. [facility] [employer] FAAPM&R (Sports Med, Neuromuscular Med) Diplomate Am Board of [place], PA-C 06/17/2018

## 2018-06-17 NOTE — Progress Notes (Addendum)
Telephone Order received for the following order per Dr. Letta Pate: Nicotine Patch 21 mg Daily for Tobacco cravings. Telephone Order [place].

## 2018-06-17 NOTE — Discharge Summary (Signed)
Physician Discharge Summary  Judy Boyd ZOX:[id number] DOB: [birth year] DOA: 06/12/2018  PCP: Patient, No Pcp Per  Admit date: 06/12/2018 Discharge date: 06/17/2018  Time spent: 35 minutes  Recommendations for Outpatient Follow-up:  1. Neurology in 1 month 2. Neurosurgery Dr.Nundkumar for R PCA aneurysm 3. Thyroid nodule needs follow up   Discharge Diagnoses:    Acute ischemic Left Pontine stroke   Right PCA Aneurysm   GERD (gastroesophageal reflux disease)   Hypertension   COPD (chronic obstructive pulmonary disease) (HCC)   Acute ischemic stroke (HCC)   Tobacco abuse   H/O medication noncompliance   Chronic pain syndrome   Leukocytosis   Discharge Condition: stable  Diet recommendation: heart healthy  Filed Weights   06/12/18 1430 06/16/18 0203  Weight: 54.4 kg 58.2 kg    History of present illness:  57 year old female with a history of hypertension, COPD, depression, and chronic pain presenting with 2-day history of facial droop and dysarthria that began on 06/10/2018.  Hospital Course:   Acute ischemic left pontine stroke - initially presented with mild hemiplegia, facial droop and dysarthria that started on 10/24 -Admitted to [facility], stroke work-up was underway, subsequently on 10/29 had worsening right hemiparesis concerning for extension of stroke --MRA brain with no large vessel occlusion, MRI on admission noted acute left pontine infarct with 5 to 6 mm right PCA aneurysm -CTA neck noted patent carotid and vertebral arteries -2D echocardiogram showed preserved EF, no thrombi, no PFO -LDL was 143, started on statin, hemoglobin A1c was 4.6 -Transferred to [facility] due to worsneing right hemiplegia, repeat MRI ordered showed progression -Neurology consulted, recommended ASA and plavix for 3weeks followed by ASA alone -PT OT/SLP evaluations completed -discharge to CIR for Rehab  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 Carles Collet discussed  with neurosurgery (Dr. Zada Finders) about aneurysm--> recommended outpatient follow-up with Dr. Kathyrn Sheriff  Essential hypertension -Allow for permissive hypertension -hydralazine prn SBP>220  Hyperlipidemia -started lipitor 40 mg daily  COPD -Stable on room air  Odontogenic infection -ContinueAugmentin -CT head and neck--revealed soft tissue inflammation over the right mandible with extensive dental disease and right submandibular lymphadenopathy.  Thyroid nodule -Incidental finding of right thyroid nodule -Outpatient follow-up  Tobacco abuse -NicoDerm patch -Counseled.   Consultations:  Neurology  Discharge Exam: Vitals:   06/17/18 1030 06/17/18 1225  BP: 137/85 (!) 146/76  Pulse: 64 74  Resp: 16 16  Temp: 97.6 F (36.4 C) 98.2 F (36.8 C)  SpO2: 99% 100%    General: AAOx3 Cardiovascular: S1S2/RRR Respiratory: CTAB  Discharge Instructions   Discharge Instructions    Ambulatory referral to Neurology   Complete by:  As directed    Follow up with stroke clinic NP (Jessica Vanschaick or Cecille Rubin, if both not available, consider Dr. Antony Contras, Dr. Bess Harvest, or Dr. Sarina Ill) at [facility] in about 4 weeks.   Ambulatory referral to Physical Therapy   Complete by:  As directed    Diet - low sodium heart healthy   Complete by:  As directed    Increase activity slowly   Complete by:  As directed      Allergies as of 06/17/2018      Reactions   Amlodipine Other (See Comments)   Dehydration       Medication List    STOP taking these medications   meloxicam 15 MG tablet Commonly known as:  MOBIC     TAKE these medications   acetaminophen 325  MG tablet Commonly known as:  TYLENOL Take 2 tablets (650 mg total) by mouth every 4 (four) hours as needed for mild pain (or temp > 37.5 C (99.5 F)).   albuterol 108 (90 Base) MCG/ACT inhaler Commonly known as:  PROVENTIL HFA;VENTOLIN HFA Inhale 2 puffs into the lungs  every 6 (six) hours as needed. Shortness of breath   amoxicillin-clavulanate 875-125 MG tablet Commonly known as:  AUGMENTIN Take 1 tablet by mouth every 12 (twelve) hours.   aspirin EC 81 MG tablet Take 81 mg by mouth daily.   atorvastatin 40 MG tablet Commonly known as:  LIPITOR Take 1 tablet (40 mg total) by mouth daily at 6 PM.   budesonide-formoterol 160-4.5 MCG/ACT inhaler Commonly known as:  SYMBICORT Inhale 2 puffs into the lungs 2 (two) times daily.   clopidogrel 75 MG tablet Commonly known as:  PLAVIX Take 1 tablet (75 mg total) by mouth daily. Start taking on:  06/18/2018   clorazepate 7.5 MG tablet Commonly known as:  TRANXENE Take 7.5 mg by mouth 2 (two) times daily as needed for anxiety.   gabapentin 300 MG capsule Commonly known as:  NEURONTIN Take 1 capsule (300 mg total) by mouth at bedtime.   hydrochlorothiazide 12.5 MG tablet Commonly known as:  HYDRODIURIL TK 1 T PO BID   montelukast 10 MG tablet Commonly known as:  SINGULAIR Take 10 mg by mouth at bedtime.   polyethylene glycol packet Commonly known as:  MIRALAX / GLYCOLAX Take 17 g by mouth daily. Start taking on:  06/18/2018   verapamil 120 MG tablet Commonly known as:  CALAN Take 1 tablet by mouth 3 (three) times daily.      Allergies  Allergen Reactions  . Amlodipine Other (See Comments)    Dehydration    Follow-up Information    [facility] Follow up.   Why:  appt October 31 st at 2 pm- (will help establish care with a primary doctor and community resources as needed) Contact information: [phone] [address] [place] [address]       [facility] Follow up in 4 week(s).   Specialty:  Neurology Why:  stroke clinic. office will call wtih appt date and time. Contact information: [address] [place] [phone]           The results of significant diagnostics from this hospitalization  (including imaging, microbiology, ancillary and laboratory) are listed below for reference.    Significant Diagnostic Studies: Ct Head Wo Contrast  Result Date: 06/15/2018 CLINICAL DATA:  Right hemiparesis. EXAM: CT HEAD WITHOUT CONTRAST TECHNIQUE: Contiguous axial images were obtained from the base of the skull through the vertex without intravenous contrast. COMPARISON:  MRI of June 14, 2018.  CT scan of June 12, 2018. FINDINGS: Brain: New low density is seen in left pontine region consistent with acute infarction. Minimal chronic ischemic white matter disease is noted. No mass effect or midline shift is noted. Ventricular size is within normal limits. No hemorrhage or evidence of mass lesion is noted. Vascular: No hyperdense vessel or unexpected calcification. Skull: Normal. Negative for fracture or focal lesion. Sinuses/Orbits: No acute finding. Other: None. IMPRESSION: Left pontine low density is noted consistent with acute infarction described on prior MRI. No other significant changes noted compared to prior exam. Electronically Signed   By: Marijo Conception, M.D.   On: 06/15/2018 11:33   Ct Head Wo Contrast  Result Date: 06/12/2018 CLINICAL DATA:  Mental status change, facial droop and slurred speech. EXAM: CT HEAD WITHOUT CONTRAST TECHNIQUE: Contiguous axial images were obtained from the base of the skull through the vertex without intravenous contrast. COMPARISON:  None. FINDINGS: Brain: Small ill-defined areas of low attenuation in the region of the left caudate head likely remote lacunar type infarcts particularly given the fact that there is slight ex vacuo dilatation of the frontal horn this area. No CT findings for hemispheric infarction or intracranial hemorrhage. No extra-axial fluid collections are identified. The gray-white differentiation is maintained. The brainstem and cerebellum are grossly normal. Vascular: No hyperdense vessel or unexpected calcification. Skull: No fracture  or bone lesion. Sinuses/Orbits: The paranasal sinuses and mastoid air cells are clear. The globes are intact. Other: No scalp lesions or hematoma. IMPRESSION: 1. Probable remote lacunar type infarcts in the left caudate area. 2. No CT findings for acute hemispheric infarction or intracranial hemorrhage. Electronically Signed   By: Marijo Sanes M.D.   On: 06/12/2018 15:56   Ct Angio Neck W Or Wo Contrast  Result Date: 06/13/2018 CLINICAL DATA:  57 y/o F; right-sided weakness starting 4 days ago. Unable to walk. Worsening symptoms. EXAM: CT ANGIOGRAPHY NECK TECHNIQUE: Multidetector CT imaging of the neck was performed using the standard protocol during bolus administration of intravenous contrast. Multiplanar CT image reconstructions and MIPs were obtained to evaluate the vascular anatomy. Carotid stenosis measurements (when applicable) are obtained utilizing NASCET criteria, using the distal internal carotid diameter as the denominator. CONTRAST:  41mL ISOVUE-370 IOPAMIDOL (ISOVUE-370) INJECTION 76% COMPARISON:  None. FINDINGS: Aortic arch: Standard branching. Imaged portion shows no evidence of aneurysm or dissection. No significant stenosis of the major arch vessel origins. Mild calcific atherosclerosis. Right carotid system: No evidence of dissection, stenosis (50% or greater) or occlusion. Carotid bifurcation calcified plaque without stenosis. Left carotid system: No evidence of dissection, stenosis (50% or greater) or occlusion. Carotid bifurcation fibrofatty plaque without stenosis. Vertebral arteries: Codominant. No evidence of dissection, stenosis (50% or greater) or occlusion. Skeleton: Moderate cervical spondylosis with predominant discogenic degenerative changes at C4-C6 where there is mild reversal of cervical curvature. Other neck: Thyroid nodules measuring up to 19 mm in the right lobe. Inflammation of soft tissues overlying the right body of the mandible. Extensive dental disease with multiple  periapical cysts and carries including the right posterior mandibular molar subjacent inflammation. No discrete abscess identified. Right submandibular lymphadenopathy, likely reactive. Upper chest: Mild-to-moderate centrilobular emphysema. IMPRESSION: 1. Patent carotid and vertebral arteries. No dissection, occlusion, aneurysm, or hemodynamically significant stenosis by NASCET criteria. 2. Soft tissue inflammation over the right body of mandible, probable odontogenic infection, no discrete soft tissue abscess. 3. Extensive dental disease with multiple periapical cysts and carries including the right posterior mandibular molars subjacent to soft tissue inflammation. 4. Right submandibular lymphadenopathy, likely reactive. 5. Thyroid nodules measuring up to 19 mm in the right lobe. Recommend further evaluation with thyroid ultrasound on a nonemergent basis. 6. Aortic Atherosclerosis (ICD10-I70.0) and Emphysema (ICD10-J43.9). Electronically Signed   By: Kristine Garbe M.D.   On: 06/13/2018 02:41   Mr Brain Wo Contrast  Result Date: 06/16/2018 CLINICAL DATA:  57 year old female with recent left pontine infarct following presentation with right side weakness and facial droop. Subsequent encounter. EXAM: MRI HEAD WITHOUT CONTRAST TECHNIQUE: Multiplanar, multiecho pulse sequences of the brain and surrounding structures were obtained without intravenous contrast. COMPARISON:  [facility] brain MRI and intracranial MRA 06/14/2018 FINDINGS: Brain: Evolved T2 hyperintensity in the left paracentral pons corresponding to the  previously demonstrated pontine infarct. However, there is increased restricted diffusion in the left thalamus since 06/14/2018, now encompassing an area of 16 millimeters (series 3, image 26). With associated T2 hyperintensity. No other restricted diffusion. No associated hemorrhage or mass effect at those sites. Accidentally, axial T1 and FLAIR images were not obtained. Stable gray  and white matter signal elsewhere, including chronic small vessel disease in the left basal ganglia and medial thalami. Susceptibility weighted images suggest occasional chronic microhemorrhage (right periventricular white matter series 8, image 57. No midline shift, mass effect, evidence of mass lesion, ventriculomegaly, extra-axial collection or acute intracranial hemorrhage. Cervicomedullary junction and pituitary are within normal limits. Vascular: Major intracranial vascular flow voids are stable. Skull and upper cervical spine: C4-C5 and C5-C6 disc and endplate degeneration. No marrow edema or evidence of acute osseous abnormality. Sinuses/Orbits: Stable and negative. Other: Stable trace right mastoid fluid. Small right face benign sebaceous cysts suspected on series 7, image 6. IMPRESSION: 1. Progressed acute lacunar infarct in the left thalamus since 06/14/2018, which was only faint on that recent MRI and suspected to be subacute rather than acute at that time. 2. The Left Pontine infarct demonstrates expected evolution. 3. No associated hemorrhage or mass effect. Electronically Signed   By: Genevie Ann M.D.   On: 06/16/2018 13:01   Mr Brain Wo Contrast  Addendum Date: 06/14/2018   ADDENDUM REPORT: 06/14/2018 11:33 ADDENDUM: I spoke to the patient's Nurse Megan by phone at 1124 hours. And the study was discussed by telephone with Dr. Carles Collet on 06/14/2018 at 1130 hours. Electronically Signed   By: Genevie Ann M.D.   On: 06/14/2018 11:33   Result Date: 06/14/2018 CLINICAL DATA:  57 year old female with right facial droop and weakness for 3 days. EXAM: MRI HEAD WITHOUT CONTRAST MRA HEAD WITHOUT CONTRAST TECHNIQUE: Multiplanar, multiecho pulse sequences of the brain and surrounding structures were obtained without intravenous contrast. Angiographic images of the head were obtained using MRA technique without contrast. COMPARISON:  CTA neck yesterday and noncontrast head CT 06/12/2018. FINDINGS: MRI HEAD FINDINGS  Brain: Densely restricted diffusion in the left pons in an area of 12-14 millimeters as seen on series 4, image 75. Associated T2 and FLAIR hyperintensity with mild pontine expansion, but no hemorrhage or significant regional mass effect. Asymmetric curvilinear signal abnormality on trace diffusion in the medial left thalamus seen on series 483, but ADC demonstrates little if any restriction. No restricted diffusion elsewhere. Chronic lacunar infarcts in the bilateral basal ganglia most pronounced at the left caudate corresponding to the plain head CT finding on 06/12/2018. Small chronic bilateral thalamic lacune is. Patchy T2 and FLAIR hyperintensity in the right pons superimposed on the acute left pontine finding. No cortical encephalomalacia or chronic cerebral blood products identified. No midline shift, mass effect, evidence of mass lesion, ventriculomegaly, extra-axial collection or acute intracranial hemorrhage. Cervicomedullary junction and pituitary are within normal limits. Vascular: Major intracranial vascular flow voids are preserved. Skull and upper cervical spine: Negative visible cervical spine. Visualized bone marrow signal is within normal limits. Sinuses/Orbits: Normal orbits soft tissues. Paranasal Visualized paranasal sinuses and mastoids are stable and well pneumatized. Other: Grossly normal visible internal auditory structures. Scalp and visible face soft tissues appear negative. MRA HEAD FINDINGS Antegrade flow in the posterior circulation with no distal vertebral artery stenosis. Mildly dominant distal left vertebral artery. Patent PICA origins and vertebrobasilar junction. Patent basilar artery without irregularity or stenosis. Normal SCA and left PCA origins. Fetal right PCA origin. Left posterior communicating artery  diminutive or absent. Bilateral PCA branches are within normal limits. Antegrade flow in both ICA siphons. Mild siphon irregularity but no siphon stenosis. Arising at the  right posterior communicating artery origin there is a lobulated inferiorly and slightly laterally and anteriorly directed aneurysm measuring 5 x 6 millimeters (series 8, image 54 and series 103, image 24). The right Pcomm seems to arise from the aneurysm as demonstrated on image 54. Otherwise normal carotid termini. The MCA and ACA origins are within normal limits. The right A1 is mildly dominant. The anterior communicating artery may be fenestrated. The ACA branches are tortuous but otherwise within normal limits. Left MCA M1 segment, left MCA bifurcation, and left MCA branches are within normal limits. The right M1 bifurcates early. The right MCA branches are within normal limits. IMPRESSION: 1. 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 2. 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 Unfortunately, the right Pcomm which is a fetal origin of the Right PCA origin to arise directly from the aneurysm. Neuro-Interventional Radiology consultation is suggested to evaluate the appropriateness of potential treatment. Non-emergent evaluation can be arranged by calling [phone] during usual hours. Emergency evaluation can be requested by paging [phone]. 3. Subacute lacunar infarct of the left thalamus, with underlying moderately advanced bilateral deep gray matter small vessel ischemia. 4. Intracranial MRA is negative for intracranial stenosis or branch occlusion. Electronically Signed: By: Genevie Ann M.D. On: 06/14/2018 09:58   Mr Jodene Nam Head/brain DJ Cm  Addendum Date: 06/14/2018   ADDENDUM REPORT: 06/14/2018 11:33 ADDENDUM: I spoke to the patient's Nurse Megan by phone at 1124 hours. And the study was discussed by telephone with Dr. Carles Collet on 06/14/2018 at 1130 hours. Electronically Signed   By: Genevie Ann M.D.   On: 06/14/2018 11:33   Result Date: 06/14/2018 CLINICAL  DATA:  57 year old female with right facial droop and weakness for 3 days. EXAM: MRI HEAD WITHOUT CONTRAST MRA HEAD WITHOUT CONTRAST TECHNIQUE: Multiplanar, multiecho pulse sequences of the brain and surrounding structures were obtained without intravenous contrast. Angiographic images of the head were obtained using MRA technique without contrast. COMPARISON:  CTA neck yesterday and noncontrast head CT 06/12/2018. FINDINGS: MRI HEAD FINDINGS Brain: Densely restricted diffusion in the left pons in an area of 12-14 millimeters as seen on series 4, image 75. Associated T2 and FLAIR hyperintensity with mild pontine expansion, but no hemorrhage or significant regional mass effect. Asymmetric curvilinear signal abnormality on trace diffusion in the medial left thalamus seen on series 483, but ADC demonstrates little if any restriction. No restricted diffusion elsewhere. Chronic lacunar infarcts in the bilateral basal ganglia most pronounced at the left caudate corresponding to the plain head CT finding on 06/12/2018. Small chronic bilateral thalamic lacune is. Patchy T2 and FLAIR hyperintensity in the right pons superimposed on the acute left pontine finding. No cortical encephalomalacia or chronic cerebral blood products identified. No midline shift, mass effect, evidence of mass lesion, ventriculomegaly, extra-axial collection or acute intracranial hemorrhage. Cervicomedullary junction and pituitary are within normal limits. Vascular: Major intracranial vascular flow voids are preserved. Skull and upper cervical spine: Negative visible cervical spine. Visualized bone marrow signal is within normal limits. Sinuses/Orbits: Normal orbits soft tissues. Paranasal Visualized paranasal sinuses and mastoids are stable and well pneumatized. Other: Grossly normal visible internal auditory structures. Scalp and visible face soft tissues appear negative. MRA HEAD FINDINGS Antegrade flow in the  posterior circulation with no distal  vertebral artery stenosis. Mildly dominant distal left vertebral artery. Patent PICA origins and vertebrobasilar junction. Patent basilar artery without irregularity or stenosis. Normal SCA and left PCA origins. Fetal right PCA origin. Left posterior communicating artery diminutive or absent. Bilateral PCA branches are within normal limits. Antegrade flow in both ICA siphons. Mild siphon irregularity but no siphon stenosis. Arising at the right posterior communicating artery origin there is a lobulated inferiorly and slightly laterally and anteriorly directed aneurysm measuring 5 x 6 millimeters (series 8, image 54 and series 103, image 24). The right Pcomm seems to arise from the aneurysm as demonstrated on image 54. Otherwise normal carotid termini. The MCA and ACA origins are within normal limits. The right A1 is mildly dominant. The anterior communicating artery may be fenestrated. The ACA branches are tortuous but otherwise within normal limits. Left MCA M1 segment, left MCA bifurcation, and left MCA branches are within normal limits. The right M1 bifurcates early. The right MCA branches are within normal limits. IMPRESSION: 1. 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 2. 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 Unfortunately, the right Pcomm which is a fetal origin of the Right PCA origin to arise directly from the aneurysm. Neuro-Interventional Radiology consultation is suggested to evaluate the appropriateness of potential treatment. Non-emergent evaluation can be arranged by calling [phone] during usual hours. Emergency evaluation can be requested by paging [phone]. 3. Subacute lacunar infarct of the left thalamus, with underlying moderately advanced bilateral deep gray matter small vessel ischemia. 4. Intracranial MRA is negative for intracranial  stenosis or branch occlusion. Electronically Signed: By: Genevie Ann M.D. On: 06/14/2018 09:58    Microbiology: No results found for this or any previous visit (from the past 240 hour(s)).   Labs: Basic Metabolic Panel: Recent Labs  Lab 06/12/18 1451 06/12/18 1500 06/15/18 1104  NA 139 141 140  K 3.9 3.9 3.8  CL 106 107 108  CO2 25  --  26  GLUCOSE 86 82 95  BUN 15 16 17   CREATININE 0.80 0.80 0.71  CALCIUM 9.4  --  9.4   Liver Function Tests: Recent Labs  Lab 06/12/18 1451  AST 35  ALT 17  ALKPHOS 92  BILITOT 1.0  PROT 7.8  ALBUMIN 4.0   No results for input(s): LIPASE, AMYLASE in the last 168 hours. No results for input(s): AMMONIA in the last 168 hours. CBC: Recent Labs  Lab 06/12/18 1451 06/12/18 1500 06/15/18 1104  WBC 12.4*  --  11.6*  NEUTROABS 6.8  --   --   HGB 14.2 15.3* 12.5  HCT 46.1* 45.0 41.5  MCV 90.2  --  92.6  PLT 351  --  308   Cardiac Enzymes: No results for input(s): CKTOTAL, CKMB, CKMBINDEX, TROPONINI in the last 168 hours. BNP: BNP (last 3 results) No results for input(s): BNP in the last 8760 hours.  ProBNP (last 3 results) No results for input(s): PROBNP in the last 8760 hours.  CBG: Recent Labs  Lab 06/12/18 1500  GLUCAP 85       Signed:  Domenic Polite MD.  [facility] 06/17/2018, 2:04 PM

## 2018-06-17 NOTE — Plan of Care (Signed)
  Problem: Consults Goal: RH STROKE PATIENT EDUCATION Description See Patient Education module for education specifics  Outcome: Progressing Goal: Nutrition Consult-if indicated Outcome: Progressing   Problem: RH BLADDER ELIMINATION Goal: RH STG MANAGE BLADDER WITH ASSISTANCE Description STG Manage Bladder With Assistance Outcome: Progressing   Problem: RH SAFETY Goal: RH STG ADHERE TO SAFETY PRECAUTIONS W/ASSISTANCE/DEVICE Description STG Adhere to Safety Precautions With Assistance/Device. Outcome: Progressing Goal: RH STG DECREASED RISK OF FALL WITH ASSISTANCE Description STG Decreased Risk of Fall With Assistance. Outcome: Progressing   Problem: RH PAIN MANAGEMENT Goal: RH STG PAIN MANAGED AT OR BELOW PT'S PAIN GOAL Outcome: Progressing   Problem: RH KNOWLEDGE DEFICIT Goal: RH STG INCREASE KNOWLEDGE OF HYPERTENSION Outcome: Progressing Goal: RH STG INCREASE KNOWLEDGE OF STROKE PROPHYLAXIS Outcome: Progressing

## 2018-06-17 NOTE — Care Management Note (Signed)
Case Management Note  Patient Details  Name: Judy Boyd MRN: [id number] Date of Birth: [birth year]  Subjective/Objective:                    Action/Plan: Patient discharging to CIR today. CM will update [facility] and sign off.  Expected Discharge Date:  06/17/18               Expected Discharge Plan:  OP Rehab  In-House Referral:     Discharge planning Services  CM Consult, [facility], Follow-up appt scheduled  Post Acute Care Choice:    Choice offered to:  Patient  DME Arranged:    DME Agency:     HH Arranged:    [place] Agency:     Status of Service:  Completed, signed off  If discussed at [employer], dates discussed:    Additional Comments:  Pollie Friar, RN 06/17/2018, 11:19 AM

## 2018-06-17 NOTE — Progress Notes (Signed)
STROKE TEAM PROGRESS NOTE   INTERVAL HISTORY Patient  remains neurologically   unchanged with dense right hemiplegia persisting.she.was a screen failure for the PREMIERS trial. She declines sleep smart trial  Vitals:   06/17/18 0441 06/17/18 0739 06/17/18 1030 06/17/18 1225  BP: 136/84  137/85 (!) 146/76  Pulse: (!) 58  64 74  Resp: 18  16 16   Temp: 97.8 F (36.6 C)  97.6 F (36.4 C) 98.2 F (36.8 C)  TempSrc: Oral  Oral Oral  SpO2: 100% 99% 99% 100%  Weight:      Height:        CBC:  Recent Labs  Lab 06/12/18 1451 06/12/18 1500 06/15/18 1104  WBC 12.4*  --  11.6*  NEUTROABS 6.8  --   --   HGB 14.2 15.3* 12.5  HCT 46.1* 45.0 41.5  MCV 90.2  --  92.6  PLT 351  --  456    Basic Metabolic Panel:  Recent Labs  Lab 06/12/18 1451 06/12/18 1500 06/15/18 1104  NA 139 141 140  K 3.9 3.9 3.8  CL 106 107 108  CO2 25  --  26  GLUCOSE 86 82 95  BUN 15 16 17   CREATININE 0.80 0.80 0.71  CALCIUM 9.4  --  9.4   Lipid Panel:     Component Value Date/Time   CHOL 222 (H) 06/13/2018 0613   TRIG 131 06/13/2018 0613   HDL 53 06/13/2018 0613   CHOLHDL 4.2 06/13/2018 0613   VLDL 26 06/13/2018 0613   LDLCALC 143 (H) 06/13/2018 0613   HgbA1c:  Lab Results  Component Value Date   HGBA1C 4.6 (L) 06/13/2018   Urine Drug Screen: No results found for: LABOPIA, COCAINSCRNUR, LABBENZ, AMPHETMU, THCU, LABBARB  Alcohol Level No results found for: [facility]  IMAGING Mr Brain Wo Contrast  Result Date: 06/16/2018 CLINICAL DATA:  57 year old female with recent left pontine infarct following presentation with right side weakness and facial droop. Subsequent encounter. EXAM: MRI HEAD WITHOUT CONTRAST TECHNIQUE: Multiplanar, multiecho pulse sequences of the brain and surrounding structures were obtained without intravenous contrast. COMPARISON:  [facility] brain MRI and intracranial MRA 06/14/2018 FINDINGS: Brain: Evolved T2 hyperintensity in the left paracentral pons corresponding to  the previously demonstrated pontine infarct. However, there is increased restricted diffusion in the left thalamus since 06/14/2018, now encompassing an area of 16 millimeters (series 3, image 26). With associated T2 hyperintensity. No other restricted diffusion. No associated hemorrhage or mass effect at those sites. Accidentally, axial T1 and FLAIR images were not obtained. Stable gray and white matter signal elsewhere, including chronic small vessel disease in the left basal ganglia and medial thalami. Susceptibility weighted images suggest occasional chronic microhemorrhage (right periventricular white matter series 8, image 57. No midline shift, mass effect, evidence of mass lesion, ventriculomegaly, extra-axial collection or acute intracranial hemorrhage. Cervicomedullary junction and pituitary are within normal limits. Vascular: Major intracranial vascular flow voids are stable. Skull and upper cervical spine: C4-C5 and C5-C6 disc and endplate degeneration. No marrow edema or evidence of acute osseous abnormality. Sinuses/Orbits: Stable and negative. Other: Stable trace right mastoid fluid. Small right face benign sebaceous cysts suspected on series 7, image 6. IMPRESSION: 1. Progressed acute lacunar infarct in the left thalamus since 06/14/2018, which was only faint on that recent MRI and suspected to be subacute rather than acute at that time. 2. The Left Pontine infarct demonstrates expected evolution. 3. No associated hemorrhage or mass effect. Electronically Signed   By: Lemmie Evens  [place] Crane M.D.   On: 06/16/2018 13:01    2D Echocardiogram  - Left ventricle: The cavity size was normal. Wall thickness was increased in a pattern of moderate LVH. Systolic function was normal. The estimated ejection fraction was in the range of 60% to 65%. Wall motion was normal; there were no regional wall motion abnormalities. Doppler parameters are consistent with abnormal left ventricular relaxation (grade 1 diastolic  dysfunction). - Aortic valve: Valve area (VTI): 2 cm^2. Valve area (Vmax): 2.21 cm^2. Valve area (Vmean): 2.11 cm^2. - Left atrium: The atrium was mildly dilated. - Technically adequate study.   PHYSICAL EXAM Obese middle-aged African-American lady not in distress. . Afebrile. Head is nontraumatic. Neck is supple without bruit.    Cardiac exam no murmur or gallop. Lungs are clear to auscultation. Distal pulses are well felt. Neurological Exam :  Awake alert oriented 3. Dysarthric speech but can be understood. No paraphasic errors. No aphasia. Extraocular moments are full range without nystagmus. She blinks to threat bilaterally. Moderate right lower facial weakness. Tongue midline. Significant right hemiplegia with 1-2/5 right upper extremity and right lower extremity strength. Symmetric touch pinprick sensation bilaterally. Deep tendon reflexes are brisker on the right than the left. Plantars are downgoing. Gait not tested.    ASSESSMENT/PLAN Ms. TONNYA GARBETT is a 57 y.o. female with history of hypertension, anxiety, depression, COPD, GERD presenting with R facial weakness, dysarthria and R hemiparesis who was admitted to [facility] 10/26 with 2 day hx of symptoms, non complaint with meds PTA. Transferred to [facility] 10/29 with worsened hemiparesis..   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  CTA neck 10/27 R mandible odontogenic infection w/ extensive dental dz and submandibular lymphadenopathy. R thyroid nodules.  Aortic atherosclerosis. Emphysema.    MRI  / MRA  10/28 L pontine infarct. R PComA aneurysm 5x77mm. Subacute L thalamic infarct. Small vessel disease. Atrophy.Marland Kitchen MRA neg.   CT head 10/29 L pontine infarct  MRI 10/30 L thalamic infarct. Evolved L pontine infarct  2D Echo  EF 60-65%. No source of embolus   LDL 143  HgbA1c 4.6  Lovenox 40 mg sq daily for VTE prophylaxis  Non compliant with  aspirin 81 mg daily prior to admission, now on aspirin 325 mg daily. Given mild stroke, recommend aspirin 81 mg and plavix 75 mg daily x 3 weeks, then aspirin alone. Orders adjusted.   Therapy recommendations:  CIR  Disposition:  pending  NOTHING FURTHER TO ADD FROM THE STROKE STANDPOINT Stroke Service will sign off. Please call should any needs arise. Follow-up Stroke Clinic at [facility] in 4 weeks after d/c. Office will call with appointment date and time. Order placed.  Hypertensive URgency  Elevated on arrival [phone]  Non compliant w/ home meds  BP improving . Permissive hypertension (OK if < 220/120) but gradually normalize in 5-7 days . Long-term BP goal normotensive  Hyperlipidemia  Home meds:  No statin  Now on lipitor 40  LDL 143, goal < 70  Continue statin at discharge  Other Stroke Risk Factors  Cigarette smoker, advised to stop smoking  ETOH use, advised to drink no more than 1 drink(s) a day  Other Active Problems  COPD  Odontogenic infection, on Augmentin  Thyroid nodule  Hospital day # 5    She presented  with a pontine infarct and unfortunately had clinical worsening. Recommend dual antiplatelet therapy for 3 weeks  followed by aspirin alone. Aggressive risk factor modification. Continue ongoing physical therapy and transferred to inpatient rehabilitation when bed available .Stroke team will sign off. Kindly call for questions. Antony Contras, MD Medical Director [facility] Pager: [phone] 06/17/2018 2:46 PM  To contact Stroke Continuity provider, please refer to [email or web address] After hours, contact General Neurology

## 2018-06-17 NOTE — PMR Pre-admission (Signed)
PMR Admission Coordinator Pre-Admission Assessment  Patient: Judy Boyd is an 57 y.o., female MRN: [id number] DOB: [birth year] Height: '5\' 6"'$  (167.6 cm) Weight: 58.2 kg              Insurance Information HMO:     PPO:      PCP:      IPA:      80/20:      OTHER:  PRIMARY: Medicaid [place]       Policy#: [id number]   Coverage Code: MAFCN   Subscriber: patient CM Name:       Phone#:      Fax#:  Pre-Cert#:       Employer:  Benefits:  Phone #: via automated system     Name: verified eligibility via [place] on 06/17/18 Eff. Date: verified eligibility on 06/17/18     Deduct: NA      Out of Pocket Max: NA      Life Max: NA CIR: Covered per Medicaid guidelines      SNF:  Outpatient:      Co-Pay:  Home Health:       Co-Pay:  DME:      Co-Pay:  Providers:  SECONDARY:       Policy#:       Subscriber:  CM Name:       Phone#:      Fax#:  Pre-Cert#:       Employer:  Benefits:  Phone #:      Name:  Eff. Date:      Deduct:       Out of Pocket Max:       Life Max:  CIR:       SNF:  Outpatient:      Co-Pay:  Home Health:      Co-Pay:  DME:     Co-Pay:   Medicaid Application Date:       Case Manager:  Disability Application Date:       Case Worker:   Emergency Contact Information Contact Information    Name Relation Home Work Mobile   Boyd,Judy Aunt [phone]       Current Medical History  Patient Admitting Diagnosis: Multiple left infarcts. History of Present Illness: Judy Boyd is a 57 y.o. RH- female with history of COPD, HTN, tobacco use, medication noncompliance, chronic pain; who was admitted to [facility] 06/12/2018 with 2-day history of right facial droop with dysarthria and right-sided weakness.  History taken from chart review and patient.  CT of head done revealing low attenuation in the region of the left caudate head likely due to remote lacunar infarcts.  CTA head neck showed no significant stenosis and soft tissue inflammation of right mandible as well as  extensive dental disease and right thyroid nodule.  2D echo done revealing EF of 60 to 65%, moderate LVH and no thrombus or PFO noted.  On 10/29 patient reported to have increase in right-sided weakness felt to be due to extension of stroke and she was transferred to [facility] for further work-up.   MRI brain reviewed, showing multiple left infarcts.  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  Neurology felt that patient with recent worsening due to edema/mass-effect.  Stroke felt to be due to small vessel disease and risk factor modification recommended.  ASA added  for secondary stroke prevention and Augmentin added for odontogenic infection.  Work-up complete and CIR recommended due to functional deficits. Pt is to be admitted to CIR on 06/17/18.   Complete NIHSS TOTAL: 10    Past Medical History  Past Medical History:  Diagnosis Date  . Anxiety   . Arthritis   . Asthma   . Bowel obstruction ([place])   . Chronic pain   . COPD (chronic obstructive pulmonary disease) ([place])   . Depression   . GERD (gastroesophageal reflux disease)   . Hypertension     Family History  family history includes Heart disease in her father and mother.  Prior Rehab/Hospitalizations:  Has the patient had major surgery during 100 days prior to admission? No  Current Medications   Current Facility-Administered Medications:  .  acetaminophen (TYLENOL) tablet 650 mg, 650 mg, Oral, Q4H PRN **OR** acetaminophen (TYLENOL) solution 650 mg, 650 mg, Per Tube, Q4H PRN **OR** acetaminophen (TYLENOL) suppository 650 mg, 650 mg, Rectal, Q4H PRN, Tat, David, MD .  albuterol (PROVENTIL) (2.5 MG/3ML) 0.083% nebulizer solution 3 mL, 3 mL, Inhalation, Q6H PRN, Tat, David, MD .  amoxicillin-clavulanate (AUGMENTIN) 875-125 MG per tablet 1 tablet, 1 tablet,  Oral, Q12H, Orson Eva, MD, 1 tablet at 06/17/18 1048 .  aspirin EC tablet 81 mg, 81 mg, Oral, Daily, Biby, Nadalee L, NP, 81 mg at 06/17/18 1048 .  atorvastatin (LIPITOR) tablet 40 mg, 40 mg, Oral, q1800, Tat, David, MD, 40 mg at 06/16/18 1817 .  clopidogrel (PLAVIX) tablet 75 mg, 75 mg, Oral, Daily, Biby, Yuko L, NP, 75 mg at 06/17/18 1048 .  enoxaparin (LOVENOX) injection 40 mg, 40 mg, Subcutaneous, Q24H, Tat, David, MD, 40 mg at 06/16/18 2212 .  feeding supplement (ENSURE ENLIVE) (ENSURE ENLIVE) liquid 237 mL, 237 mL, Oral, BID BM, Tat, David, MD, 237 mL at 06/17/18 1048 .  mometasone-formoterol (DULERA) 200-5 MCG/ACT inhaler 2 puff, 2 puff, Inhalation, BID, Tat, David, MD, 2 puff at 06/17/18 0739 .  montelukast (SINGULAIR) tablet 10 mg, 10 mg, Oral, QHS, Tat, Shanon Brow, MD, 10 mg at 06/16/18 2212 .  polyethylene glycol (MIRALAX / GLYCOLAX) packet 17 g, 17 g, Oral, Daily, Domenic Polite, MD, 17 g at 06/17/18 1049 .  senna-docusate (Senokot-S) tablet 1 tablet, 1 tablet, Oral, QHS PRN, Tat, David, MD, 1 tablet at 06/17/18 1048  Patients Current Diet:  Diet Order            Diet Heart Room service appropriate? Yes; Fluid consistency: Thin  Diet effective now              Precautions / Restrictions Precautions Precautions: Fall Precaution Comments: right side hemiparesis Restrictions Weight Bearing Restrictions: No Other Position/Activity Restrictions: R LE buckles with weight acceptance   Has the patient had 2 or more falls or a fall with injury in the past year?No  Prior Activity Level Community (5-7x/wk): active; worked full time at [employer] / [place] Devices/Equipment: None Home Equipment: None  Prior Device Use: Indicate devices/aids used by the patient prior to current illness, exacerbation or injury? None of the above  Prior Functional Level Prior Function Level of Independence: Independent Comments: Patient employed at [employer] and works  on [employer], she lives alone and is independent with all ADL's, IADL,s and funcitonal mobility  Self Care: Did the patient need help bathing, dressing, using the toilet or eating?  Independent  Indoor Mobility: Did the patient need assistance with walking from room to room (  with or without device)? Independent  Stairs: Did the patient need assistance with internal or external stairs (with or without device)? Independent  Functional Cognition: Did the patient need help planning regular tasks such as shopping or remembering to take medications? Independent  Current Functional Level Cognition  Arousal/Alertness: Awake/alert Overall Cognitive Status: Impaired/Different from baseline Orientation Level: Oriented X4 Awareness: Impaired Problem Solving: Impaired Problem Solving Impairment: Verbal basic Executive Function: Reasoning, Organizing Reasoning: Impaired Reasoning Impairment: Verbal basic Organizing: Impaired Organizing Impairment: Verbal basic Behaviors: Impulsive, Poor frustration tolerance Safety/Judgment: Impaired    Extremity Assessment (includes Sensation/Coordination)  Upper Extremity Assessment: RUE deficits/detail RUE Deficits / Details: R hemiparesis, noted increasing tone within flexor synergy, limited grasp and decreased functional use  RUE Sensation: (reports decreased sensation, numbness and tingling ) RUE Coordination: decreased fine motor, decreased gross motor  Lower Extremity Assessment: Defer to PT evaluation RLE Deficits / Details: 3+ to 4-/5 throughout Rt LE for hip flexion/extension, knee flex/extension, and dorsiflexion RLE Coordination: decreased gross motor    ADLs  Overall ADL's : Needs assistance/impaired Eating/Feeding: Bed level, Minimal assistance Eating/Feeding Details (indicate cue type and reason): to open containers and packages. Patient wears dentures.  Grooming: Applying deodorant, Moderate assistance, Sitting Grooming Details  (indicate cue type and reason): Pt requiring assistance to take top off and put on toothpaste, seemingly unable to use right hand to assist, OT assisting for lateral pinch to hold toothpast. Pt using soley left hand for tasks.  Upper Body Bathing: Minimal assistance, Sitting Upper Body Bathing Details (indicate cue type and reason): min assist for L UE and at times supporting R UE for protection  Lower Body Bathing: Minimal assistance, +2 for safety/equipment, Sit to/from stand Lower Body Bathing Details (indicate cue type and reason): standing at sink with therapist supporting R knee, 0 hand support, min assist to wash buttocks, peri area and upper legs  Upper Body Dressing : Moderate assistance, Sitting Upper Body Dressing Details (indicate cue type and reason): to thread R UE and tie gown, pt able to recall hemi dressing technique but demonstrates poor carryover of technqiue Lower Body Dressing: Total assistance, Sit to/from stand Lower Body Dressing Details (indicate cue type and reason): pt requires total assist to don new brief in standing  Toilet Transfer: Stand-pivot, Moderate assistance(t orecliner ) Toilet Transfer Details (indicate cue type and reason): mod assist to ascend and maintain balance during stand pivot towards L side, cueing for seqencing and safety Functional mobility during ADLs: Moderate assistance General ADL Comments: requires assist to block R knee throughout session in standing, non functional use of R UE during session     Mobility  Overal bed mobility: Needs Assistance Bed Mobility: Rolling, Supine to Sit Rolling: Modified independent (Device/Increase time) Supine to sit: Min assist General bed mobility comments: seated in chair upon entry     Transfers  Overall transfer level: Needs assistance Equipment used: None Transfers: Sit to/from Stand, Stand Pivot Transfers Sit to Stand: Min assist Stand pivot transfers: Mod assist General transfer comment: Requires  R knee blocking during sit to stand, mod assist for stand pivot with cueing for sequencing and safety due to decreased functional use of R side     Ambulation / Gait / Stairs / Wheelchair Mobility  Ambulation/Gait Ambulation/Gait assistance: Max Web designer (Feet): 8 Feet Assistive device: 1 person hand held assist, Straight cane Gait Pattern/deviations: Step-to pattern, Decreased step length - right, Decreased stance time - right, Decreased stride length General Gait Details: limited to 7-8  steps at bedside with frequent buckling of RLE, requires tactile assist to advance RLE due to weakness, frequent falling over to the right, poor right hand grip strength for using RW Gait velocity: slow    Posture / Balance Dynamic Sitting Balance Sitting balance - Comments: Pt able to maintain sitting balance during evaluation of strength and sensation testing. Unaware of RUE position in space Static Standing Balance Single Leg Stance - Right Leg: 0 Single Leg Stance - Left Leg: 3 Balance Overall balance assessment: Needs assistance Sitting-balance support: Feet supported, No upper extremity supported Sitting balance-Leahy Scale: Fair Sitting balance - Comments: Pt able to maintain sitting balance during evaluation of strength and sensation testing. Unaware of RUE position in space Standing balance support: During functional activity, No upper extremity supported Standing balance-Leahy Scale: Poor Standing balance comment: required mod assist to maintain balance during self care tasks, blocking R knee in standing  Single Leg Stance - Right Leg: 0 Single Leg Stance - Left Leg: 3    Special needs/care consideration BiPAP/CPAP: no; can not afford, however Dr. Leonie Man states pt is interested in trying to participate in the sleep smart stroke prevention study.  CPM: no Continuous Drip IV: no Dialysis: no        Days: NA Life Vest: no Oxygen: no Special Bed: no Trach Size: no Wound Vac  (area): no      Location: no Skin: no areas of concern                               Bowel mgmt: 06/11/18, question continence Bladder mgmt: incontinent  Diabetic mgmt: no     Previous Home Environment Living Arrangements: Alone Available Help at Discharge: Family Type of Home: Mobile home Home Layout: One level Home Access: Stairs to enter Entrance Stairs-Rails: Can reach both Entrance Stairs-Number of Steps: 4 Bathroom Toilet: Standard Bathroom Accessibility: Yes [place]: No  Discharge Living Setting Plans for Discharge Living Setting: House, Other (Comment)(plan is to go to Dtr's house vs her house with assistance ) Type of Home at Discharge: Other (Comment)(house vs single wide trailor) Discharge Home Layout: One level Discharge Home Access: Other (comment)(for house: 1 step no rails; for trailor: 4 steps B rails) Discharge Bathroom Shower/Tub: Tub/shower unit Discharge Bathroom Toilet: Standard Discharge Bathroom Accessibility: Yes How Accessible: Accessible via walker Does the patient have any problems obtaining your medications?: No  Social/Family/Support Systems Patient Roles: Parent, Other (Comment)(full time worker) Contact Information: daughter is emergency contact: [name] [phone] Anticipated Caregiver: daughter and 2 other family members Anticipated Caregiver's Contact Information: see above Ability/Limitations of Caregiver: able to physically assist Caregiver Availability: 24/7 Discharge Plan Discussed with Primary Caregiver: Yes Is Caregiver In Agreement with Plan?: Yes Does Caregiver/Family have Issues with Lodging/Transportation while Pt is in Rehab?: No   Goals/Additional Needs Patient/Family Goal for Rehab: OT/PT: Min A; SLP: Supervision Expected length of stay: 17-22 days Cultural Considerations: NA Dietary Needs: heart healthy, thin liquids Equipment Needs: TBD Pt/Family Agrees to Admission and willing to participate: Yes Program  Orientation Provided & Reviewed with Pt/Caregiver Including Roles  & Responsibilities: Yes(pt and daughter)  Barriers to Discharge: Home environment Child psychotherapist, Insurance for SNF coverage  Barriers to Discharge Comments: pt impulsive; unsure of DC location (daughter house vs her house); MAF coverage has no SNF benefits.    Decrease burden of Care through IP rehab admission: NA   Possible need for SNF placement upon discharge: not  anticipated; pt does not have SNF benefits through current Medicaid plan. Pt and family understand the plan is home after CIR stay with anticiapted assistance.    Patient Condition: This patient's condition remains as documented in the consult dated 06/17/18, in which the Rehabilitation Physician determined and documented that the patient's condition is appropriate for intensive rehabilitative care in an inpatient rehabilitation facility. Will admit to inpatient rehab today.  Preadmission Screen Completed By:  Jhonnie Garner, 06/17/2018 11:25 AM ______________________________________________________________________   Discussed status with Dr. Letta Pate on 06/17/18 at 10:56AM and received telephone approval for admission today.  Admission Coordinator:  Jhonnie Garner, time 10:56AM/Date 06/17/18.

## 2018-06-17 NOTE — Progress Notes (Signed)
Physical Medicine and Rehabilitation Consult   Reason for Consult: Stroke with functional deficits Referring Physician: Dr. Broadus John   HPI: Judy Boyd is a 57 y.o. RH- female with history of COPD, HTN, tobacco use, medication noncompliance, chronic pain; who was admitted to [facility] 06/12/2018 with 2-day history of right facial droop with dysarthria and right-sided weakness.  History taken from chart review and patient.  CT of head done revealing low attenuation in the region of the left caudate head likely due to remote lacunar infarcts.  CTA head neck showed no significant stenosis and soft tissue inflammation of right mandible as well as extensive dental disease and right thyroid nodule.  2D echo done revealing EF of 60 to 65%, moderate LVH and no thrombus or PFO noted.  On 10/29 patient reported to have increase in right-sided weakness felt to be due to extension of stroke and she was transferred to [facility] for further work-up.   MRI brain reviewed, showing multiple left infarcts.  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  Neurology felt that patient with recent worsening due to edema/mass-effect.  Stroke felt to be due to small vessel disease and risk factor modification recommended.  ASA added for secondary stroke prevention and Augmentin added for odontogenic infection.  Work-up underway and CIR recommended due to functional deficits  Review of Systems  Constitutional: Negative for chills and fever.  HENT: Negative for tinnitus.   Eyes: Negative for blurred vision and double vision.  Respiratory: Negative for shortness of breath.   Cardiovascular: Negative for chest pain and palpitations.  Gastrointestinal: Negative for constipation, heartburn and nausea.  Genitourinary: Negative for dysuria  and urgency.       Incontinence  Musculoskeletal: Negative for back pain and myalgias.  Skin: Negative for itching and rash.  Neurological: Positive for speech change and focal weakness. Negative for sensory change.  Psychiatric/Behavioral: Positive for depression. The patient does not have insomnia.   All other systems reviewed and are negative.        Past Medical History:  Diagnosis Date  . Anxiety   . Arthritis   . Asthma   . Bowel obstruction ([place])   . Chronic pain   . COPD (chronic obstructive pulmonary disease) ([place])   . Depression   . GERD (gastroesophageal reflux disease)   . Hypertension          Past Surgical History:  Procedure Laterality Date  . ABDOMINAL SURGERY  2003   blockage  . left salpingectomy    . uterine ablation           Family History  Problem Relation Age of Onset  . Heart disease Mother   . Heart disease Father     Social History:  Single. Works for [employer]. She reports that she has been smoking cigarettes--1/2 PPD. She has a 10.00 pack-year smoking history. She has never used smokeless tobacco. She reports that she drinks alcohol--1-2 beers daily.  She reports that she does not use drugs.         Allergies  Allergen Reactions  . Amlodipine Other (See Comments)    Dehydration           Medications Prior to Admission  Medication Sig Dispense Refill  . albuterol (PROVENTIL HFA;VENTOLIN HFA) 108 (90 BASE) MCG/ACT inhaler Inhale 2  puffs into the lungs every 6 (six) hours as needed. Shortness of breath    . budesonide-formoterol (SYMBICORT) 160-4.5 MCG/ACT inhaler Inhale 2 puffs into the lungs 2 (two) times daily.     Marland Kitchen aspirin EC 81 MG tablet Take 81 mg by mouth daily.    . clorazepate (TRANXENE) 7.5 MG tablet Take 7.5 mg by mouth 2 (two) times daily as needed for anxiety.    . gabapentin (NEURONTIN) 300 MG capsule Take 1 capsule (300 mg total) by mouth at bedtime. (Patient not taking:  Reported on 06/12/2018) 30 capsule 1  . hydrochlorothiazide (HYDRODIURIL) 12.5 MG tablet TK 1 T PO BID  3  . meloxicam (MOBIC) 15 MG tablet Take 1 tablet (15 mg total) by mouth daily. (Patient not taking: Reported on 06/12/2018) 30 tablet 5  . montelukast (SINGULAIR) 10 MG tablet Take 10 mg by mouth at bedtime.      . verapamil (CALAN) 120 MG tablet Take 1 tablet by mouth 3 (three) times daily.  0    Home: Home Living Family/patient expects to be discharged to:: Unsure Living Arrangements: Alone Available Help at Discharge: Family Type of Home: Mobile home Home Access: Stairs to enter Technical brewer of Steps: 4 Entrance Stairs-Rails: Can reach both Home Layout: One level Biochemist, clinical: Standard Bathroom Accessibility: Yes Home Equipment: None  Functional History: Prior Function Level of Independence: Independent Comments: Patient employed at [employer] and works on [employer], she lives alone and is independent with all ADL's, IADL,s and funcitonal mobility Functional Status:  Mobility: Bed Mobility Overal bed mobility: Modified Independent General bed mobility comments: very slow labored movement having to use LUE to move right arm and leg Transfers Overall transfer level: Needs assistance Equipment used: Straight cane, 1 person hand held assist Transfers: Sit to/from Stand, Stand Pivot Transfers Sit to Stand: Min assist Stand pivot transfers: Mod assist General transfer comment: frequent buckling of RLE, requires hands on assist to maintain standing balance Ambulation/Gait Ambulation/Gait assistance: Max assist Gait Distance (Feet): 8 Feet Assistive device: 1 person hand held assist, Straight cane Gait Pattern/deviations: Step-to pattern, Decreased step length - right, Decreased stance time - right, Decreased stride length General Gait Details: limited to 7-8 steps at bedside with frequent buckling of RLE, requires tactile assist to advance RLE due to weakness,  frequent falling over to the right, poor right hand grip strength for using RW Gait velocity: slow  ADL: ADL Overall ADL's : Needs assistance/impaired Eating/Feeding: Bed level, Minimal assistance Eating/Feeding Details (indicate cue type and reason): to open containers and packages. Patient wears dentures.  Grooming: Wash/dry face, Oral care, Set up, Sitting Grooming Details (indicate cue type and reason): Pt requiring assistance to take top off and put on toothpaste, seemingly unable to use right hand to assist, OT assisting for lateral pinch to hold toothpast. Pt using soley left hand for tasks.  Lower Body Dressing: Moderate assistance, Sitting/lateral leans Lower Body Dressing Details (indicate cue type and reason): pt able to doff socks with left hand, unable to use RUE to assist to donning gripper socks. OT assisting with donning socks Toilet Transfer: Min guard, Ambulation, BSC General ADL Comments: Pt unable to perform tasks in standing due to RLE buckling, not using RUE during session  Cognition: Cognition Overall Cognitive Status: Within Functional Limits for tasks assessed Arousal/Alertness: Awake/alert Orientation Level: Oriented X4 Awareness: Impaired Problem Solving: Impaired Problem Solving Impairment: Verbal basic Executive Function: Reasoning, Organizing Reasoning: Impaired Reasoning Impairment: Verbal basic Organizing: Impaired Organizing Impairment: Verbal basic  Behaviors: Impulsive, Poor frustration tolerance Safety/Judgment: Impaired Cognition Arousal/Alertness: Awake/alert Behavior During Therapy: WFL for tasks assessed/performed Overall Cognitive Status: Within Functional Limits for tasks assessed  Blood pressure (!) 147/119, pulse 67, temperature 97.9 F (36.6 C), temperature source Oral, resp. rate 16, height 5\' 6"  (1.676 m), weight 58.2 kg, SpO2 99 %. Physical Exam  Vitals reviewed. Constitutional: She is oriented to person, place, and time. She  appears well-developed and well-nourished.  HENT:  Head: Normocephalic and atraumatic.  Eyes: EOM are normal. Right eye exhibits no discharge. Left eye exhibits no discharge.  Neck: Normal range of motion. Neck supple.  Cardiovascular: Normal rate and regular rhythm.  Respiratory: Effort normal and breath sounds normal.  GI: Soft. Bowel sounds are normal.  Musculoskeletal:  No edema or tenderness in extremities  Neurological: She is alert and oriented to person, place, and time.  Moderate dysarthria.  Right hemiparesis with sensory deficits.  Motor: Right upper extremity: Trace with apraxia proximal to distal Right lower extremity: 0/5 proximal distal Left upper extremity/left lower extremity: 5/5 proximal distal Sensation intact light touch Facial weakness  Skin: Skin is warm and dry.  Psychiatric: She has a normal mood and affect. Her behavior is normal.    LabResultsLast24Hours  Results for orders placed or performed during the hospital encounter of 06/12/18 (from the past 24 hour(s))  Basic metabolic panel     Status: None   Collection Time: 06/15/18 11:04 AM  Result Value Ref Range   Sodium 140 135 - 145 mmol/L   Potassium 3.8 3.5 - 5.1 mmol/L   Chloride 108 98 - 111 mmol/L   CO2 26 22 - 32 mmol/L   Glucose, Bld 95 70 - 99 mg/dL   BUN 17 6 - 20 mg/dL   Creatinine, Ser 0.71 0.44 - 1.00 mg/dL   Calcium 9.4 8.9 - 10.3 mg/dL   GFR calc non Af Amer >60 >60 mL/min   GFR calc Af Amer >60 >60 mL/min   Anion gap 6 5 - 15  CBC     Status: Abnormal   Collection Time: 06/15/18 11:04 AM  Result Value Ref Range   WBC 11.6 (H) 4.0 - 10.5 K/uL   RBC 4.48 3.87 - 5.11 MIL/uL   Hemoglobin 12.5 12.0 - 15.0 g/dL   HCT 41.5 36.0 - 46.0 %   MCV 92.6 80.0 - 100.0 fL   MCH 27.9 26.0 - 34.0 pg   MCHC 30.1 30.0 - 36.0 g/dL   RDW 12.6 11.5 - 15.5 %   Platelets 308 150 - 400 K/uL   nRBC 0.0 0.0 - 0.2 %      ImagingResults(Last48hours)  Ct Head Wo  Contrast  Result Date: 06/15/2018 CLINICAL DATA:  Right hemiparesis. EXAM: CT HEAD WITHOUT CONTRAST TECHNIQUE: Contiguous axial images were obtained from the base of the skull through the vertex without intravenous contrast. COMPARISON:  MRI of June 14, 2018.  CT scan of June 12, 2018. FINDINGS: Brain: New low density is seen in left pontine region consistent with acute infarction. Minimal chronic ischemic white matter disease is noted. No mass effect or midline shift is noted. Ventricular size is within normal limits. No hemorrhage or evidence of mass lesion is noted. Vascular: No hyperdense vessel or unexpected calcification. Skull: Normal. Negative for fracture or focal lesion. Sinuses/Orbits: No acute finding. Other: None. IMPRESSION: Left pontine low density is noted consistent with acute infarction described on prior MRI. No other significant changes noted compared to prior exam. Electronically Signed   By:  Marijo Conception, M.D.   On: 06/15/2018 11:33     Assessment/Plan: Diagnosis: Multiple left infarcts. Labs and images (see above) independently reviewed.  Records reviewed and summated above. Stroke: Continue secondary stroke prophylaxis and Risk Factor Modification listed below:   Antiplatelet therapy:   Blood Pressure Management:  Continue current medication with prn's with permisive HTN per primary team Statin Agent:   Tobacco abuse:   Right sided hemiparesis: fit for orthosis to prevent contractures (resting hand splint for day, wrist cock up splint at night, PRAFO, etc) Motor recovery: Fluoxetine  1. Does the need for close, 24 hr/day medical supervision in concert with the patient's rehab needs make it unreasonable for this patient to be served in a less intensive setting? Yes  2. Co-Morbidities requiring supervision/potential complications: COPD (monitor respiratory rate and O2 sats with increased physical exertion), HTN (monitor and provide prns in accordance with  increased physical exertion and pain), tobacco use (counsel), medication noncompliance (counsel), chronic pain (Biofeedback training with therapies to help reduce reliance on opiate pain medications, monitor pain control during therapies, and sedation at rest and titrate to maximum efficacy to ensure participation and gains in therapies), leukocytosis (repeat labs, cont to monitor for signs and symptoms of infection, further workup if indicated) 3. Due to safety, disease management and patient education, does the patient require 24 hr/day rehab nursing? Yes 4. Does the patient require coordinated care of a physician, rehab nurse, PT (1-2 hrs/day, 5 days/week), OT (1-2 hrs/day, 5 days/week) and SLP (1-2 hrs/day, 5 days/week) to address physical and functional deficits in the context of the above medical diagnosis(es)? Yes Addressing deficits in the following areas: balance, endurance, locomotion, strength, transferring, bathing, dressing, toileting, speech and psychosocial support 5. Can the patient actively participate in an intensive therapy program of at least 3 hrs of therapy per day at least 5 days per week? Yes 6. The potential for patient to make measurable gains while on inpatient rehab is excellent 7. Anticipated functional outcomes upon discharge from inpatient rehab are min assist  with PT, min assist with OT, n/a with SLP. 8. Estimated rehab length of stay to reach the above functional goals is: 17-22 days. 9. Anticipated D/C setting: Other 10. Anticipated post D/C treatments: HH therapy and Home excercise program 11. Overall Rehab/Functional Prognosis: good  RECOMMENDATIONS: This patient's condition is appropriate for continued rehabilitative care in the following setting: CIR if caregiver support available upon discharge. Patient has agreed to participate in recommended program. Potentially Note that insurance prior authorization may be required for reimbursement for recommended  care.  Comment: Rehab Admissions Coordinator to follow up.   I have personally performed a face to face diagnostic evaluation, including, but not limited to relevant history and physical exam findings, of this patient and developed relevant assessment and plan.  Additionally, I have reviewed and concur with the physician assistant's documentation above.   Delice Lesch, MD, ABPMR Bary Leriche, PA-C 06/16/2018        Revision History                        Routing History

## 2018-06-17 NOTE — Progress Notes (Signed)
Physical Therapy Treatment Patient Details Name: Judy Boyd MRN: [id number] DOB: [birth year] Today's Date: 06/17/2018    History of Present Illness 57 year old female with a history of hypertension, COPD, depression, and chronic pain presenting with 2-day history of facial droop and dysarthria that began on 06/10/2018.  The patient complained of right upper extremity and right lower extremity weakness that started around the same time. On 10/29 her right hemiparesis was noted to have worsened, which was felt most likely to be due to extension of her stroke. Pt was moved from [facility] on 06/15/2018. CT scan on 06/15/2018 stated Left pontine low density is noted consistent with acute infarction described on prior MRI.    PT Comments    Pt was in chair upon PT arrival. Pt A&O x 4. Pt said she was tired but agreed to PT. She stated she walked with a cane when she was at [facility], but has lost all movement in her R UE and LE since. She ambulated 18 feet in hospital room with session requiring Max A +2 in order to facilitate RLE and prevent R knee from buckling. Pt seemed impulsive and required multiple VC/TC for safety and to maintain balance while ambulating and transferring back to chair. Also note that pt refused to go in hallway for gt training with RW. Pt was left in chair with family member present. Pt would benefit from continued PT in order to progress toward goals, maximize functional independence and be d/c to CIR.   Follow Up Recommendations  CIR     Equipment Recommendations  3in1 (PT)    Recommendations for Other Services Rehab consult     Precautions / Restrictions Precautions Precautions: Fall Precaution Comments: right side hemiparesis Restrictions Weight Bearing Restrictions: No Other Position/Activity Restrictions: R LE buckles with weight acceptance    Mobility  Bed Mobility                  Transfers Overall transfer level: Needs  assistance Equipment used: Standard walker Transfers: Sit to/from Stand;Stand Pivot Transfers Sit to Stand: Min assist Stand pivot transfers: Mod assist       General transfer comment: Requires R knee blocking during sit to stand, mod assist for stand pivot with cueing for sequencing and safety due to decreased functional use of R side. Pt requiring VC/TC for safety and maintaining balance upon standing and sitting from chair.  Ambulation/Gait Ambulation/Gait assistance: Max assist;+2 physical assistance Gait Distance (Feet): 18 Feet(second trial of 19ft from EOB to chair) Assistive device: Rolling walker (2 wheeled) Gait Pattern/deviations: Step-to pattern;Decreased step length - right;Decreased stance time - right;Decreased stride length;Decreased dorsiflexion - right;Decreased weight shift to right;Scissoring;Drifts right/left;Narrow base of support     General Gait Details: First trial of 37ft to door and back to EOB, second trial 4 ft step to chair prevent R Knee from buckling during ambulation. Pt seemed impulsive and stated "I don't want to go in the hallway with the walker". R hand grip on RW improved once therapist placed hand upon standing. Pt requried multiple VC for safety with RW.   Stairs             Wheelchair Mobility    Modified Rankin (Stroke Patients Only) Modified Rankin (Stroke Patients Only) Pre-Morbid Rankin Score: No symptoms Modified Rankin: Severe disability     Balance Overall balance assessment: Needs assistance Sitting-balance support: Feet supported;No upper extremity supported Sitting balance-Leahy Scale: Fair Sitting balance - Comments: Pt able to  maintain sitting balance while perfoming LE exercises. Unaware of RUE position in space   Standing balance support: During functional activity;Bilateral upper extremity supported Standing balance-Leahy Scale: Poor Standing balance comment: required mod assist to maintain balance when assessing  standing balance                            Cognition Arousal/Alertness: Awake/alert Behavior During Therapy: Agitated;Flat affect;Impulsive Overall Cognitive Status: Impaired/Different from baseline Area of Impairment: Problem solving;Awareness                           Awareness: Emergent Problem Solving: Requires verbal cues;Slow processing        Exercises Total Joint Exercises Ankle Circles/Pumps: PROM;10 reps;Seated;Right;AROM;Left Long Arc Quad: PROM;Right;10 reps;AROM;Left;Seated Marching in Standing: AROM;Left;10 reps;PROM;Right;Seated    General Comments        Pertinent Vitals/Pain      Home Living                      Prior Function            PT Goals (current goals can now be found in the care plan section) Acute Rehab PT Goals Patient Stated Goal: get strong enough to return home independently PT Goal Formulation: With patient Time For Goal Achievement: 06/18/18 Potential to Achieve Goals: Fair    Frequency    Min 4X/week      PT Plan Current plan remains appropriate    Co-evaluation              AM-PAC PT "6 Clicks" Daily Activity  Outcome Measure  Difficulty turning over in bed (including adjusting bedclothes, sheets and blankets)?: A Little Difficulty moving from lying on back to sitting on the side of the bed? : A Lot Difficulty sitting down on and standing up from a chair with arms (e.g., wheelchair, bedside commode, etc,.)?: A Lot Help needed moving to and from a bed to chair (including a wheelchair)?: A Lot Help needed walking in hospital room?: A Lot Help needed climbing 3-5 steps with a railing? : Total 6 Click Score: 12    End of Session Equipment Utilized During Treatment: Gait belt Activity Tolerance: Patient limited by fatigue Patient left: in chair;with call bell/phone within reach;with chair alarm set Nurse Communication: Mobility status PT Visit Diagnosis: Unsteadiness on feet  (R26.81);Muscle weakness (generalized) (M62.81);Hemiplegia and hemiparesis Hemiplegia - Right/Left: Right Hemiplegia - dominant/non-dominant: Dominant Hemiplegia - caused by: Cerebral infarction     Time: 8280-0349 PT Time Calculation (min) (ACUTE ONLY): 22 min  Charges:  $Gait Training: 8-22 mins                     [id number], SPTA   [place] 06/17/2018, 1:34 PM

## 2018-06-17 NOTE — Progress Notes (Signed)
PMR Admission Coordinator Pre-Admission Assessment  Patient: Judy Boyd is an 57 y.o., female MRN: [id number] DOB: 1961/05/15 Height: _0  (167.6 cm) Weight: 58.2 kg                                                                                                                                                  Insurance Information HMO:     PPO:      PCP:      IPA:      80/20:      OTHER:  PRIMARY: Medicaid [place]       Policy#: [id number] P   Coverage Code: MAFCN   Subscriber: patient CM Name:       Phone#:      Fax#:  Pre-Cert#:       Employer:  Benefits:  Phone #: via automated system     Name: verified eligibility via [place] on 06/17/18 Eff. Date: verified eligibility on 06/17/18     Deduct: NA      Out of Pocket Max: NA      Life Max: NA CIR: Covered per Medicaid guidelines      SNF:  Outpatient:      Co-Pay:  Home Health:       Co-Pay:  DME:      Co-Pay:  Providers:  SECONDARY:       Policy#:       Subscriber:  CM Name:       Phone#:      Fax#:  Pre-Cert#:       Employer:  Benefits:  Phone #:      Name:  Eff. Date:      Deduct:       Out of Pocket Max:       Life Max:  CIR:       SNF:  Outpatient:      Co-Pay:  Home Health:      Co-Pay:  DME:     Co-Pay:   Medicaid Application Date:       Case Manager:  Disability Application Date:       Case Worker:   Emergency Contact Information         Contact Information    Name Relation Home Work Mobile   Judy Boyd,Judy Boyd Aunt [phone]       Current Medical History  Patient Admitting Diagnosis: Multiple left infarcts. History of Present Illness: Judy Boyd a 57 y.o.RH-femalewith history of COPD, HTN, tobacco use, medication noncompliance, chronic pain; [facility]-day history of right facial droop with dysarthria and right-sided weakness.History taken from chart review and patient.CT of head done revealing low attenuation in the region of the left caudate  head likely due to remote lacunar infarcts. CTA head neck showed no significant stenosis and soft tissue inflammation of right mandible as well as  extensive dental disease and right thyroid nodule. 2D echo done revealing EF of 60 to 65%,moderate LVH and no thrombus or PFO noted. On 10/29 patient reported to have increase in right-sided weakness felt to be due to extension of stroke and she was transferred to [facility] for further work-up.   MRIbrain reviewed, showing multiple left infarcts. Per MRI/MRA brainreport,subacute lacunar infarct left thalamus with moderate advance deep white matter disease and acute left pontine infarct with cytotoxic edemaas well as5 x 6 mm right Pcommaneurysm. Neurology felt that patient with recent worsening due to edema/mass-effect.Stroke felt to be due to small vessel disease and risk factor modification recommended. ASA added for secondary stroke prevention and Augmentin added for odontogenic infection. Work-up complete and CIR recommended due to functional deficits. Pt is to be admitted to CIR on 06/17/18.   Complete NIHSS TOTAL: 10  Past Medical History      Past Medical History:  Diagnosis Date  . Anxiety   . Arthritis   . Asthma   . Bowel obstruction ([place])   . Chronic pain   . COPD (chronic obstructive pulmonary disease) ([place])   . Depression   . GERD (gastroesophageal reflux disease)   . Hypertension     Family History  family history includes Heart disease in her father and mother.  Prior Rehab/Hospitalizations:  Has the patient had major surgery during 100 days prior to admission? No  Current Medications   Current Facility-Administered Medications:  .  acetaminophen (TYLENOL) tablet 650 mg, 650 mg, Oral, Q4H PRN **OR** acetaminophen (TYLENOL) solution 650 mg, 650 mg, Per Tube, Q4H PRN **OR** acetaminophen (TYLENOL) suppository 650 mg, 650 mg, Rectal, Q4H PRN, Judy Boyd, David, MD .  albuterol (PROVENTIL) (2.5  MG/3ML) 0.083% nebulizer solution 3 mL, 3 mL, Inhalation, Q6H PRN, Judy Boyd, David, MD .  amoxicillin-clavulanate (AUGMENTIN) 875-125 MG per tablet 1 tablet, 1 tablet, Oral, Q12H, Judy Eva, MD, 1 tablet at 06/17/18 1048 .  aspirin EC tablet 81 mg, 81 mg, Oral, Daily, Judy Boyd, Judy L, NP, 81 mg at 06/17/18 1048 .  atorvastatin (LIPITOR) tablet 40 mg, 40 mg, Oral, q1800, Judy Boyd, David, MD, 40 mg at 06/16/18 1817 .  clopidogrel (PLAVIX) tablet 75 mg, 75 mg, Oral, Daily, Judy Boyd, Judy L, NP, 75 mg at 06/17/18 1048 .  enoxaparin (LOVENOX) injection 40 mg, 40 mg, Subcutaneous, Q24H, Judy Boyd, David, MD, 40 mg at 06/16/18 2212 .  feeding supplement (ENSURE ENLIVE) (ENSURE ENLIVE) liquid 237 mL, 237 mL, Oral, BID BM, Judy Boyd, David, MD, 237 mL at 06/17/18 1048 .  mometasone-formoterol (DULERA) 200-5 MCG/ACT inhaler 2 puff, 2 puff, Inhalation, BID, Judy Boyd, David, MD, 2 puff at 06/17/18 0739 .  montelukast (SINGULAIR) tablet 10 mg, 10 mg, Oral, QHS, Judy Boyd, Judy Brow, MD, 10 mg at 06/16/18 2212 .  polyethylene glycol (MIRALAX / GLYCOLAX) packet 17 g, 17 g, Oral, Daily, Judy Polite, MD, 17 g at 06/17/18 1049 .  senna-docusate (Senokot-S) tablet 1 tablet, 1 tablet, Oral, QHS PRN, Judy Boyd, David, MD, 1 tablet at 06/17/18 1048  Patients Current Diet:     Diet Order             Diet Heart Room service appropriate? Yes; Fluid consistency: Thin  Diet effective now               Precautions / Restrictions Precautions Precautions: Fall Precaution Comments: right side hemiparesis Restrictions Weight Bearing Restrictions: No Other Position/Activity Restrictions: R LE buckles with weight acceptance   Has the patient had 2 or more falls  or a fall with injury in the past year?No  Prior Activity Level Community (5-7x/wk): active; worked full time at [employer] / [place] Devices/Equipment: None Home Equipment: None  Prior Device Use: Indicate devices/aids used by the patient prior  to current illness, exacerbation or injury? None of the above  Prior Functional Level Prior Function Level of Independence: Independent Comments: Patient employed at [employer] and works on [employer], she lives alone and is independent with all ADL's, IADL,s and funcitonal mobility  Self Care: Did the patient need help bathing, dressing, using the toilet or eating?  Independent  Indoor Mobility: Did the patient need assistance with walking from room to room (with or without device)? Independent  Stairs: Did the patient need assistance with internal or external stairs (with or without device)? Independent  Functional Cognition: Did the patient need help planning regular tasks such as shopping or remembering to take medications? Independent  Current Functional Level Cognition  Arousal/Alertness: Awake/alert Overall Cognitive Status: Impaired/Different from baseline Orientation Level: Oriented X4 Awareness: Impaired Problem Solving: Impaired Problem Solving Impairment: Verbal basic Executive Function: Reasoning, Organizing Reasoning: Impaired Reasoning Impairment: Verbal basic Organizing: Impaired Organizing Impairment: Verbal basic Behaviors: Impulsive, Poor frustration tolerance Safety/Judgment: Impaired    Extremity Assessment (includes Sensation/Coordination)  Upper Extremity Assessment: RUE deficits/detail RUE Deficits / Details: R hemiparesis, noted increasing tone within flexor synergy, limited grasp and decreased functional use  RUE Sensation: (reports decreased sensation, numbness and tingling ) RUE Coordination: decreased fine motor, decreased gross motor  Lower Extremity Assessment: Defer to PT evaluation RLE Deficits / Details: 3+ to 4-/5 throughout Rt LE for hip flexion/extension, knee flex/extension, and dorsiflexion RLE Coordination: decreased gross motor    ADLs  Overall ADL's : Needs assistance/impaired Eating/Feeding: Bed level, Minimal  assistance Eating/Feeding Details (indicate cue type and reason): to open containers and packages. Patient wears dentures.  Grooming: Applying deodorant, Moderate assistance, Sitting Grooming Details (indicate cue type and reason): Pt requiring assistance to take top off and put on toothpaste, seemingly unable to use right hand to assist, OT assisting for lateral pinch to hold toothpast. Pt using soley left hand for tasks.  Upper Body Bathing: Minimal assistance, Sitting Upper Body Bathing Details (indicate cue type and reason): min assist for Boyd UE and at times supporting R UE for protection  Lower Body Bathing: Minimal assistance, +2 for safety/equipment, Sit to/from stand Lower Body Bathing Details (indicate cue type and reason): standing at sink with therapist supporting R knee, 0 hand support, min assist to wash buttocks, peri area and upper legs  Upper Body Dressing : Moderate assistance, Sitting Upper Body Dressing Details (indicate cue type and reason): to thread R UE and tie gown, pt able to recall hemi dressing technique but demonstrates poor carryover of technqiue Lower Body Dressing: Total assistance, Sit to/from stand Lower Body Dressing Details (indicate cue type and reason): pt requires total assist to don new brief in standing  Toilet Transfer: Stand-pivot, Moderate assistance(t orecliner ) Toilet Transfer Details (indicate cue type and reason): mod assist to ascend and maintain balance during stand pivot towards Boyd side, cueing for seqencing and safety Functional mobility during ADLs: Moderate assistance General ADL Comments: requires assist to block R knee throughout session in standing, non functional use of R UE during session     Mobility  Overal bed mobility: Needs Assistance Bed Mobility: Rolling, Supine to Sit Rolling: Modified independent (Device/Increase time) Supine to sit: Min assist General bed mobility comments: seated in  chair upon entry     Transfers   Overall transfer level: Needs assistance Equipment used: None Transfers: Sit to/from Stand, Stand Pivot Transfers Sit to Stand: Min assist Stand pivot transfers: Mod assist General transfer comment: Requires R knee blocking during sit to stand, mod assist for stand pivot with cueing for sequencing and safety due to decreased functional use of R side     Ambulation / Gait / Stairs / Wheelchair Mobility  Ambulation/Gait Ambulation/Gait assistance: Max Web designer (Feet): 8 Feet Assistive device: 1 person hand held assist, Straight cane Gait Pattern/deviations: Step-to pattern, Decreased step length - right, Decreased stance time - right, Decreased stride length General Gait Details: limited to 7-8 steps at bedside with frequent buckling of RLE, requires tactile assist to advance RLE due to weakness, frequent falling over to the right, poor right hand grip strength for using RW Gait velocity: slow    Posture / Balance Dynamic Sitting Balance Sitting balance - Comments: Pt able to maintain sitting balance during evaluation of strength and sensation testing. Unaware of RUE position in space Static Standing Balance Single Leg Stance - Right Leg: 0 Single Leg Stance - Left Leg: 3 Balance Overall balance assessment: Needs assistance Sitting-balance support: Feet supported, No upper extremity supported Sitting balance-Leahy Scale: Fair Sitting balance - Comments: Pt able to maintain sitting balance during evaluation of strength and sensation testing. Unaware of RUE position in space Standing balance support: During functional activity, No upper extremity supported Standing balance-Leahy Scale: Poor Standing balance comment: required mod assist to maintain balance during self care tasks, blocking R knee in standing  Single Leg Stance - Right Leg: 0 Single Leg Stance - Left Leg: 3    Special needs/care consideration BiPAP/CPAP: no; can not afford, however Dr. Leonie Man states pt is  interested in trying to participate in the sleep smart stroke prevention study.  CPM: no Continuous Drip IV: no Dialysis: no        Days: NA Life Vest: no Oxygen: no Special Bed: no Trach Size: no Wound Vac (area): no      Location: no Skin: no areas of concern                               Bowel mgmt: 06/11/18, question continence Bladder mgmt: incontinent  Diabetic mgmt: no     Previous Home Environment Living Arrangements: Alone Available Help at Discharge: Family Type of Home: Mobile home Home Layout: One level Home Access: Stairs to enter Entrance Stairs-Rails: Can reach both Entrance Stairs-Number of Steps: 4 Bathroom Toilet: Standard Bathroom Accessibility: Yes [place]: No  Discharge Living Setting Plans for Discharge Living Setting: House, Other (Comment)(plan is to go to Dtr's house vs her house with assistance ) Type of Home at Discharge: Other (Comment)(house vs single wide trailor) Discharge Home Layout: One level Discharge Home Access: Other (comment)(for house: 1 step no rails; for trailor: 4 steps B rails) Discharge Bathroom Shower/Tub: Tub/shower unit Discharge Bathroom Toilet: Standard Discharge Bathroom Accessibility: Yes How Accessible: Accessible via walker Does the patient have any problems obtaining your medications?: No  Social/Family/Support Systems Patient Roles: Parent, Other (Comment)(full time worker) Contact Information: daughter is emergency contact: [name] [phone] Anticipated Caregiver: daughter and 2 other family members Anticipated Caregiver's Contact Information: see above Ability/Limitations of Caregiver: able to physically assist Caregiver Availability: 24/7 Discharge Plan Discussed with Primary Caregiver: Yes Is Caregiver In Agreement with Plan?: Yes Does Caregiver/Family have  Issues with Lodging/Transportation while Pt is in Rehab?: No   Goals/Additional Needs Patient/Family Goal for Rehab: OT/PT: Min  A; SLP: Supervision Expected length of stay: 17-22 days Cultural Considerations: NA Dietary Needs: heart healthy, thin liquids Equipment Needs: TBD Pt/Family Agrees to Admission and willing to participate: Yes Program Orientation Provided & Reviewed with Pt/Caregiver Including Roles  & Responsibilities: Yes(pt and daughter)  Barriers to Discharge: Home environment Child psychotherapist, Insurance for SNF coverage  Barriers to Discharge Comments: pt impulsive; unsure of DC location (daughter house vs her house); MAF coverage has no SNF benefits.    Decrease burden of Care through IP rehab admission: NA   Possible need for SNF placement upon discharge: not anticipated; pt does not have SNF benefits through current Medicaid plan. Pt and family understand the plan is home after CIR stay with anticiapted assistance.    Patient Condition: This patient's condition remains as documented in the consult dated 06/17/18, in which the Rehabilitation Physician determined and documented that the patient's condition is appropriate for intensive rehabilitative care in an inpatient rehabilitation facility. Will admit to inpatient rehab today.  Preadmission Screen Completed By:  Jhonnie Garner, 06/17/2018 11:25 AM ______________________________________________________________________   Discussed status with Dr. Letta Pate on 06/17/18 at 10:56AM and received telephone approval for admission today.  Admission Coordinator:  Jhonnie Garner, time 10:56AM/Date 06/17/18.           Cosigned by: Charlett Blake, MD at 06/17/2018 11:45 AM  Revision History

## 2018-06-17 NOTE — Progress Notes (Signed)
57 y.o. Female admitted to Room # [phone] accompanied by Sister. Upon admission assessment Patient is alert and oriented x 4. Skin assessment rendered x 2 RN. No skin issues noted at present. Patient and Family oriented to milieu and unit specific guidelines. Safety precautions maintained.

## 2018-06-17 NOTE — Progress Notes (Signed)
Inpatient Rehabilitation-Admissions Coordinator    Met with patient at the bedside to discuss team's recommendation for inpatient rehabilitation. Shared booklets, expectations while in CIR, expected length of stay, and anticipated functional level at Dc. Pt is willing to pursue CIR and allowed [facility] to discuss program with her daughter to confirm plan for assist at DC. Spoke with daughter at length regarding program and assistance anticipated at DC.   AC has received medical approval for admit today. Will update RN, CM, and SW regarding plans.   Please call if questions.   Judy Boyd, OTR/L  Rehab Admissions Coordinator  [phone] 06/17/2018 11:13 AM

## 2018-06-17 NOTE — Plan of Care (Signed)
  Problem: Activity: Goal: Risk for activity intolerance will decrease Outcome: Completed/Met   Problem: Safety: Goal: Ability to remain free from injury will improve Outcome: Completed/Met   Problem: Education: Goal: Knowledge of disease or condition will improve Outcome: Completed/Met Goal: Knowledge of secondary prevention will improve Outcome: Completed/Met Goal: Knowledge of patient specific risk factors addressed and post discharge goals established will improve Outcome: Completed/Met Goal: Individualized Educational Video(s) Outcome: Completed/Met   Problem: Self-Care: Goal: Verbalization of feelings and concerns over difficulty with self-care will improve Outcome: Completed/Met Goal: Ability to communicate needs accurately will improve Outcome: Completed/Met   Problem: Nutrition: Goal: Risk of aspiration will decrease Outcome: Completed/Met

## 2018-06-18 ENCOUNTER — Inpatient Hospital Stay (HOSPITAL_COMMUNITY): Payer: Medicaid Other | Admitting: Physical Therapy

## 2018-06-18 ENCOUNTER — Inpatient Hospital Stay (HOSPITAL_COMMUNITY): Payer: Medicaid Other | Admitting: Speech Pathology

## 2018-06-18 ENCOUNTER — Inpatient Hospital Stay (HOSPITAL_COMMUNITY): Payer: Medicaid Other

## 2018-06-18 LAB — COMPREHENSIVE METABOLIC PANEL
ALBUMIN: 3.3 g/dL — AB (ref 3.5–5.0)
ALT: 23 U/L (ref 0–44)
AST: 28 U/L (ref 15–41)
Alkaline Phosphatase: 79 U/L (ref 38–126)
Anion gap: 5 (ref 5–15)
BILIRUBIN TOTAL: 0.7 mg/dL (ref 0.3–1.2)
BUN: 23 mg/dL — ABNORMAL HIGH (ref 6–20)
CALCIUM: 9.5 mg/dL (ref 8.9–10.3)
CO2: 27 mmol/L (ref 22–32)
CREATININE: 0.77 mg/dL (ref 0.44–1.00)
Chloride: 109 mmol/L (ref 98–111)
GFR calc Af Amer: >60 mL/min (ref >60)
GFR calc non Af Amer: >60 mL/min (ref >60)
GLUCOSE: 95 mg/dL (ref 70–99)
POTASSIUM: 4.5 mmol/L (ref 3.5–5.1)
Sodium: 141 mmol/L (ref 135–145)
Total Protein: 6.5 g/dL (ref 6.5–8.1)

## 2018-06-18 LAB — CBC WITH DIFFERENTIAL/PLATELET
Abs Immature Granulocytes: 0.03 10*3/uL (ref 0.00–0.07)
BASOS ABS: 0 10*3/uL (ref 0.0–0.1)
Basophils Relative: 0 %
Eosinophils Absolute: 0.2 10*3/uL (ref 0.0–0.5)
Eosinophils Relative: 2 %
HEMATOCRIT: 38.5 % (ref 36.0–46.0)
HEMOGLOBIN: 11.2 g/dL — AB (ref 12.0–15.0)
Immature Granulocytes: 0 %
LYMPHS ABS: 4.4 10*3/uL — AB (ref 0.7–4.0)
LYMPHS PCT: 41 %
MCH: 27.3 pg (ref 26.0–34.0)
MCHC: 29.1 g/dL — ABNORMAL LOW (ref 30.0–36.0)
MCV: 93.7 fL (ref 80.0–100.0)
Monocytes Absolute: 0.7 10*3/uL (ref 0.1–1.0)
Monocytes Relative: 7 %
NEUTROS ABS: 5.4 10*3/uL (ref 1.7–7.7)
NRBC: 0 % (ref 0.0–0.2)
Neutrophils Relative %: 50 %
Platelets: 310 10*3/uL (ref 150–400)
RBC: 4.11 MIL/uL (ref 3.87–5.11)
RDW: 12.3 % (ref 11.5–15.5)
WBC: 10.8 10*3/uL — ABNORMAL HIGH (ref 4.0–10.5)

## 2018-06-18 LAB — OCCULT BLOOD X 1 CARD TO LAB, STOOL: Fecal Occult Bld: NEGATIVE

## 2018-06-18 NOTE — Progress Notes (Signed)
[place] PHYSICAL MEDICINE & REHABILITATION PROGRESS NOTE   Subjective/Complaints:  No issues overntie, slept ok, ate ~50% breakfast Reviewed labwork  ROS- neg for CP, SOB, N/V/D Objective:   Mr Brain Wo Contrast  Result Date: 06/16/2018 CLINICAL DATA:  57 year old female with recent left pontine infarct following presentation with right side weakness and facial droop. Subsequent encounter. EXAM: MRI HEAD WITHOUT CONTRAST TECHNIQUE: Multiplanar, multiecho pulse sequences of the brain and surrounding structures were obtained without intravenous contrast. COMPARISON:  [facility] brain MRI and intracranial MRA 06/14/2018 FINDINGS: Brain: Evolved T2 hyperintensity in the left paracentral pons corresponding to the previously demonstrated pontine infarct. However, there is increased restricted diffusion in the left thalamus since 06/14/2018, now encompassing an area of 16 millimeters (series 3, image 26). With associated T2 hyperintensity. No other restricted diffusion. No associated hemorrhage or mass effect at those sites. Accidentally, axial T1 and FLAIR images were not obtained. Stable gray and white matter signal elsewhere, including chronic small vessel disease in the left basal ganglia and medial thalami. Susceptibility weighted images suggest occasional chronic microhemorrhage (right periventricular white matter series 8, image 57. No midline shift, mass effect, evidence of mass lesion, ventriculomegaly, extra-axial collection or acute intracranial hemorrhage. Cervicomedullary junction and pituitary are within normal limits. Vascular: Major intracranial vascular flow voids are stable. Skull and upper cervical spine: C4-C5 and C5-C6 disc and endplate degeneration. No marrow edema or evidence of acute osseous abnormality. Sinuses/Orbits: Stable and negative. Other: Stable trace right mastoid fluid. Small right face benign sebaceous cysts suspected on series 7, image 6. IMPRESSION: 1.  Progressed acute lacunar infarct in the left thalamus since 06/14/2018, which was only faint on that recent MRI and suspected to be subacute rather than acute at that time. 2. The Left Pontine infarct demonstrates expected evolution. 3. No associated hemorrhage or mass effect. Electronically Signed   By: Genevie Ann M.D.   On: 06/16/2018 13:01   Recent Labs    06/15/18 1104 06/18/18 0505  WBC 11.6* 10.8*  HGB 12.5 11.2*  HCT 41.5 38.5  PLT 308 310   Recent Labs    06/15/18 1104 06/18/18 0505  NA 140 141  K 3.8 4.5  CL 108 109  CO2 26 27  GLUCOSE 95 95  BUN 17 23*  CREATININE 0.71 0.77  CALCIUM 9.4 9.5    Intake/Output Summary (Last 24 hours) at 06/18/2018 0738 Last data filed at 06/17/2018 2300 Gross per 24 hour  Intake 240 ml  Output -  Net 240 ml     Physical Exam: Vital Signs Blood pressure (!) 142/87, pulse (!) 55, temperature 97.6 F (36.4 C), resp. rate 16, height _0  (1.676 m), weight 58.2 kg, SpO2 100 %.     Assessment/Plan: 1. Functional deficits secondary to Left pontine and thalamic infarcts which require 3+ hours per day of interdisciplinary therapy in a comprehensive inpatient rehab setting.  Physiatrist is providing close team supervision and 24 hour management of active medical problems listed below.  Physiatrist and rehab team continue to assess barriers to discharge/monitor patient progress toward functional and medical goals  Care Tool:  Bathing              Bathing assist       Upper Body Dressing/Undressing Upper body dressing   What is the patient wearing?: Hospital gown only    Upper body assist      Lower Body Dressing/Undressing Lower body dressing      What is the patient wearing?:  Incontinence brief     Lower body assist       Toileting Toileting    Toileting assist       Transfers Chair/bed transfer  Transfers assist           Locomotion Ambulation   Ambulation assist              Walk 10  feet activity   Assist           Walk 50 feet activity   Assist           Walk 150 feet activity   Assist           Walk 10 feet on uneven surface  activity   Assist           Wheelchair     Assist               Wheelchair 50 feet with 2 turns activity    Assist            Wheelchair 150 feet activity     Assist             Medical Problem List and Plan: 1.   Right hemiparesis, dysarthria secondary to left pontine and left thalamic infarcts, around 06/10/2018 aspirin and Plavix CIR evals today 2. DVT Prophylaxis/Anticoagulation: Pharmaceutical: Lovenox 3. Pain Management: Tylenol 4. Mood: We will monitor, may need neuropsych consult, trazodone for sleep 5. Neuropsych: This patient is capable of making decisions on her own behalf. 6. Skin/Wound Care: No areas identified we will continue to monitor 7. Fluids/Electrolytes/Nutrition: Recheck C met, monitor I's and O's 8.  COPD Dulera inhaler 2 puffs twice a day, Singulair 10 mg at bedtime 9.  History of hypertension resume verapamil if pressures remain elevated 1 to 2 weeks post stroke  10.  Mild leukocytosis , improving , afebrile cont to monitor 11. Mild anemia, no obvious sign of blood loss , check stool guaic LOS: 1 days A FACE TO FACE EVALUATION WAS PERFORMED  Charlett Blake 06/18/2018, 7:38 AM

## 2018-06-18 NOTE — Progress Notes (Signed)
Inpatient Rehabilitation  Patient information reviewed and entered into eRehab system by Stephene Alegria M. D'Arcy Abraha, M.A., CCC/SLP, PPS Coordinator.  Information including medical coding, functional ability and quality indicators will be reviewed and updated through discharge.    

## 2018-06-18 NOTE — IPOC Note (Signed)
Overall Plan of Care [facility]) Patient Details Name: Judy Boyd MRN: [id number] DOB: [birth year]  Admitting Diagnosis: <principal problem not specified>  Hospital Problems: Active Problems:   Hypertension   COPD (chronic obstructive pulmonary disease) (HCC)   Acute ischemic stroke (HCC)   Hemiparesis affecting right side as late effect of stroke ([place])   Stroke (cerebrum) ([place])     Functional Problem List: Nursing Bladder, Endurance, Medication Management, Motor, Safety, Sensory  PT Balance, Endurance, Safety, Motor, Sensory  OT Balance, Cognition, Endurance, Motor, Pain, Perception, Safety, Vision  SLP Perception, Safety  TR         Basic ADL's: OT Grooming, Eating, Bathing, Dressing, Toileting     Advanced  ADL's: OT Simple Meal Preparation     Transfers: PT Bed Mobility, Bed to Chair, Car, Manufacturing systems engineer, Metallurgist: PT Ambulation, Emergency planning/management officer, Stairs     Additional Impairments: OT Fuctional Use of Upper Extremity  SLP Communication, Social Cognition expression Awareness  TR      Anticipated Outcomes Item Anticipated Outcome  Self Feeding S  Swallowing      Basic self-care  S  Toileting  S   Bathroom Transfers S  Bowel/Bladder  Moderate Assist   Transfers  supervision with LRAD  Locomotion  supervision with LRAD  Communication  Mod I   Cognition  Supervision  Pain  < 3  Safety/Judgment  Minimal Assist    Therapy Plan: PT Intensity: Minimum of 1-2 x/day ,45 to 90 minutes PT Frequency: 5 out of 7 days PT Duration Estimated Length of Stay: 14-18 days OT Intensity: Minimum of 1-2 x/day, 45 to 90 minutes OT Frequency: 5 out of 7 days OT Duration/Estimated Length of Stay: 14-18 SLP Intensity: Minumum of 1-2 x/day, 30 to 90 minutes SLP Frequency: 3 to 5 out of 7 days SLP Duration/Estimated Length of Stay: 2 weeks    Team Interventions: Nursing Interventions Patient/Family Education, Bladder Management,  Disease Management/Prevention, Pain Management, Medication Management, Psychosocial Support, Discharge Planning  PT interventions Cognitive remediation/compensation, Ambulation/gait training, Discharge planning, DME/adaptive equipment instruction, Functional mobility training, Pain management, Psychosocial support, Splinting/orthotics, Therapeutic Activities, UE/LE Strength taining/ROM, Visual/perceptual remediation/compensation, Wheelchair propulsion/positioning, UE/LE Coordination activities, Therapeutic Exercise, Stair training, Skin care/wound management, Patient/family education, Neuromuscular re-education, Functional electrical stimulation, Disease management/prevention, Academic librarian, Training and development officer  OT Interventions Training and development officer, Discharge planning, Functional electrical stimulation, Pain management, Self Care/advanced ADL retraining, UE/LE Coordination activities, Therapeutic Activities, Cognitive remediation/compensation, Disease mangement/prevention, Functional mobility training, Patient/family education, Skin care/wound managment, Therapeutic Exercise, Visual/perceptual remediation/compensation, Academic librarian, Engineer, drilling, Neuromuscular re-education, Psychosocial support, Splinting/orthotics, UE/LE Strength taining/ROM, Wheelchair propulsion/positioning  SLP Interventions Patient/family education, Speech/Language facilitation, Cognitive remediation/compensation, Therapeutic Activities  TR Interventions    SW/CM Interventions Discharge Planning, Psychosocial Support, Patient/Family Education   Barriers to Discharge MD  Medical stability  Nursing Medication compliance    PT Decreased caregiver support, Lack of/limited family support pt lives alone but states her daughter can come stay with her  OT Decreased caregiver support, Lack of/limited family support    SLP      SW Medication compliance non-compliant prior to  admission no PCP   Team Discharge Planning: Destination: PT-Home ,OT- Home , SLP-Home Projected Follow-up: PT-Home health PT, 24 hour supervision/assistance, OT-  Home health OT, SLP-24 hour supervision/assistance Projected Equipment Needs: PT-To be determined, OT- Tub/shower bench, To be determined, 3 in 1 bedside comode, SLP-None recommended by SLP Equipment Details: PT- , OT-  Patient/family involved in discharge planning: PT-  Patient,  OT-Patient, SLP-Patient  MD ELOS: 14-18 Medical Rehab Prognosis:  Excellent Assessment: The patient has been admitted for CIR therapies with the diagnosis of cva with right hemiparesis. The team will be addressing functional mobility, strength, stamina, balance, safety, adaptive techniques and equipment, self-care, bowel and bladder mgt, patient and caregiver education, NMR, visual spatial awareness, cognition, communication . Goals have been set at supervision with mobility and self-care and supervision to mod I with cognition.    Meredith Staggers, MD, FAAPMR      See Team Conference Notes for weekly updates to the plan of care

## 2018-06-18 NOTE — Evaluation (Signed)
Occupational Therapy Assessment and Plan  Patient Details  Name: Judy Boyd MRN: [id number] Date of Birth: 1961/05/08  OT Diagnosis: apraxia, cognitive deficits, hemiplegia affecting dominant side and muscle weakness (generalized) Rehab Potential:   ELOS: 14-18   Today's Date: 06/18/2018 OT Individual Time: 0800-0900 OT Individual Time Calculation (min): 60 min     Problem List:  Patient Active Problem List   Diagnosis Date Noted  . Hemiparesis affecting right side as late effect of stroke ([place]) 06/17/2018  . Stroke (cerebrum) ([place]) 06/17/2018  . H/O medication noncompliance   . Chronic pain syndrome   . Leukocytosis   . Acute ischemic stroke ([place]) 06/13/2018  . Tobacco abuse 06/13/2018  . Lacunar stroke ([place]) 06/12/2018  . Hypertension   . COPD (chronic obstructive pulmonary disease) ([place])   . GERD (gastroesophageal reflux disease) 04/06/2012  . Abdominal pain 04/06/2012  . Nausea & vomiting 04/06/2012  . Melena 04/06/2012    Past Medical History:  Past Medical History:  Diagnosis Date  . Anxiety   . Arthritis   . Asthma   . Bowel obstruction ([place])   . Chronic pain   . COPD (chronic obstructive pulmonary disease) ([place])   . Depression   . GERD (gastroesophageal reflux disease)   . Hypertension    Past Surgical History:  Past Surgical History:  Procedure Laterality Date  . ABDOMINAL SURGERY  2003   blockage  . left salpingectomy    . uterine ablation      Assessment & Plan Clinical Impression: 57 year old female with a history of hypertension, COPD, depression, and chronic pain presenting with 2-day history of facial droop and dysarthria that began on 06/10/2018.  The patient complained of right upper extremity and right lower extremity weakness that started around the same time. On 10/29 her right hemiparesis was noted to have worsened, which was felt most likely to be due to extension of her stroke. Pt was moved from [facility] on  06/15/2018. CT scan on 06/15/2018 stated Left pontine low density is noted consistent with acute infarction described on prior MRI.  Patient currently requires mod with basic self-care skills secondary to muscle weakness, decreased cardiorespiratoy endurance, impaired timing and sequencing, abnormal tone, unbalanced muscle activation, motor apraxia, decreased coordination and decreased motor planning, decreased visual motor skills, decreased attention to right and decreased motor planning, decreased attention, decreased awareness, decreased problem solving, decreased safety awareness, decreased memory and delayed processing and decreased sitting balance, decreased standing balance, decreased postural control, hemiplegia and decreased balance strategies.  Prior to hospitalization, patient could complete BADL/IADL with independent .  Patient will benefit from skilled intervention to decrease level of assist with basic self-care skills and increase independence with basic self-care skills prior to discharge home with care partner.  Anticipate patient will require 24 hour supervision  and follow up home health.  OT - End of Session Activity Tolerance: Tolerates 30+ min activity with multiple rests Endurance Deficit: Yes OT Assessment Rehab Potential (ACUTE ONLY): Good OT Barriers to Discharge: Decreased caregiver support;Lack of/limited family support OT Patient demonstrates impairments in the following area(s): Balance;Cognition;Endurance;Motor;Pain;Perception;Safety;Vision OT Basic ADL's Functional Problem(s): Grooming;Eating;Bathing;Dressing;Toileting OT Advanced ADL's Functional Problem(s): Simple Meal Preparation OT Transfers Functional Problem(s): Toilet;Tub/Shower OT Additional Impairment(s): Fuctional Use of Upper Extremity OT Plan OT Intensity: Minimum of 1-2 x/day, 45 to 90 minutes OT Frequency: 5 out of 7 days OT Duration/Estimated Length of Stay: 14-18 OT Treatment/Interventions:  Balance/vestibular training;Discharge planning;Functional electrical stimulation;Pain management;Self Care/advanced ADL retraining;UE/LE Coordination activities;Therapeutic Activities;Cognitive  remediation/compensation;Disease mangement/prevention;Functional mobility training;Patient/family education;Skin care/wound managment;Therapeutic Exercise;Visual/perceptual remediation/compensation;Community reintegration;DME/adaptive equipment instruction;Neuromuscular re-education;Psychosocial support;Splinting/orthotics;UE/LE Strength taining/ROM;Wheelchair propulsion/positioning OT Self Feeding Anticipated Outcome(s): S OT Basic Self-Care Anticipated Outcome(s): S OT Toileting Anticipated Outcome(s): S OT Bathroom Transfers Anticipated Outcome(s): S OT Recommendation Patient destination: Home Follow Up Recommendations: Home health OT Equipment Recommended: Tub/shower bench;To be determined;3 in 1 bedside comode   Skilled Therapeutic Intervention 1;1. Pt received seated in bed agreeable to bathing and dressing at shower and sink level. Pt educated on role/purpose of OT, CIR, ELOS, POC, hemi techniques, recovery after stroke, R inattention and safety awareness. During functional transfers pt requires min A overall with knee block and VC for safety awareness during transfers at pt is impulsive. Pt able to utilize hemi techniques without VC, however requires A for orientation of clothing and up to MOD A during clothing management for standing balance. Pt demo R body inattention and visual field inattention as pt requires VC for looking at OT on R side during conversation. Exited session with pt seated in bed, call light in reach and all needs met  OT Evaluation Precautions/Restrictions  Precautions Precautions: Fall Precaution Comments: right side hemiparesis Restrictions Other Position/Activity Restrictions: R LE buckles with weight acceptance General Chart Reviewed: Yes Vital Signs Oxygen  Therapy SpO2: 98 % O2 Device: Room Air Pain Pain Assessment Pain Score: 0-No pain Home Living/Prior Functioning Home Living Family/patient expects to be discharged to:: Unsure Living Arrangements: Children Available Help at Discharge: Family Type of Home: Mobile home Home Access: Stairs to enter Entrance Stairs-Rails: Can reach both Home Layout: One level Bathroom Shower/Tub: Chiropodist: Standard Bathroom Accessibility: Yes IADL History Homemaking Responsibilities: Yes Meal Prep Responsibility: Primary Laundry Responsibility: Primary Cleaning Responsibility: Primary Bill Paying/Finance Responsibility: Primary Shopping Responsibility: Primary Child Care Responsibility: Primary Current License: Yes Mode of Transportation: Car Occupation: Full time employment Prior Function Comments: Patient employed at [employer] and works on [employer], she lives alone and is independent with all ADL's, IADL,s and funcitonal mobility ADL ADL Grooming: Minimal assistance(standing) Where Assessed-Grooming: Standing at sink Upper Body Bathing: Minimal assistance Where Assessed-Upper Body Bathing: Shower Lower Body Bathing: Minimal assistance Where Assessed-Lower Body Bathing: Shower Upper Body Dressing: Moderate assistance Where Assessed-Upper Body Dressing: Wheelchair, Sitting at sink Lower Body Dressing: Minimal assistance Where Assessed-Lower Body Dressing: Wheelchair, Sitting at sink, Standing at sink Toileting: Minimal assistance Toilet Transfer: Minimal assistance Toilet Transfer Method: Stand pivot Science writer: Energy manager: Environmental education officer Method: Radiographer, therapeutic: Radio broadcast assistant, Grab bars Vision Baseline Vision/History: No visual deficits Patient Visual Report: No change from baseline Vision Assessment?: Yes;Vision impaired- to be further tested in functional  context Tracking/Visual Pursuits: Decreased smoothness of horizontal tracking Saccades: Within functional limits Visual Fields: No apparent deficits Perception  Perception: Impaired Praxis Praxis: Impaired Cognition Overall Cognitive Status: Impaired/Different from baseline Arousal/Alertness: Awake/alert Year: 2019 Month: November Day of Week: Incorrect Immediate Memory Recall: Sock;Blue;Bed Memory Recall: Blue;Sock Memory Recall Sock: Without Cue Memory Recall Blue: Without Cue Awareness: Impaired Problem Solving: Impaired Problem Solving Impairment: Verbal basic Sensation Sensation Light Touch: Appears Intact Coordination Gross Motor Movements are Fluid and Coordinated: No Fine Motor Movements are Fluid and Coordinated: No Motor  Motor Motor: Hemiplegia Motor - Skilled Clinical Observations: R hemi Mobility  Transfers Sit to Stand: Minimal Assistance - Patient > 75%  Trunk/Postural Assessment  Cervical Assessment Cervical Assessment: Within Functional Limits Thoracic Assessment Thoracic Assessment: Within Functional Limits Lumbar Assessment Lumbar Assessment: Within  Functional Limits Postural Control Postural Control: Deficits on evaluation(delayed/absent)  Balance Balance Balance Assessed: Yes Dynamic Sitting Balance Dynamic Sitting - Level of Assistance: 5: Stand by assistance Dynamic Sitting Balance - Compensations: bathing Sitting balance - Comments: Pt able to maintain sitting balance while perfoming LE exercises. Unaware of RUE position in space Dynamic Standing Balance Dynamic Standing - Level of Assistance: 3: Mod assist;4: Min assist Dynamic Standing - Comments: min-mod A during dressing Extremity/Trunk Assessment RUE Assessment RUE Assessment: Exceptions to [facility] Active Range of Motion (AROM) Comments: shoulder elevation, digit flexion/ext trace RUE Body System: Neuro Brunstrum levels for arm and hand: Arm;Hand Brunstrum level for arm: Stage II  Synergy is developing Brunstrum level for hand: Stage II Synergy is developing LUE Assessment LUE Assessment: Within Functional Limits     Refer to Care Plan for Long Term Goals  Recommendations for other services: Therapeutic Recreation  Kitchen group, Stress management and Outing/community reintegration   Discharge Criteria: Patient will be discharged from OT if patient refuses treatment 3 consecutive times without medical reason, if treatment goals not met, if there is a change in medical status, if patient makes no progress towards goals or if patient is discharged from hospital.  The above assessment, treatment plan, treatment alternatives and goals were discussed and mutually agreed upon: by patient  Tonny Branch 06/18/2018, 8:13 AM

## 2018-06-18 NOTE — Progress Notes (Signed)
Social Work  Social Work Assessment and Plan  Patient Details  Name: Judy Boyd MRN: [id number] Date of Birth: 1960/10/20  Today's Date: 06/18/2018  Problem List:  Patient Active Problem List   Diagnosis Date Noted  . Hemiparesis affecting right side as late effect of stroke ([place]) 06/17/2018  . Stroke (cerebrum) ([place]) 06/17/2018  . H/O medication noncompliance   . Chronic pain syndrome   . Leukocytosis   . Acute ischemic stroke ([place]) 06/13/2018  . Tobacco abuse 06/13/2018  . Lacunar stroke ([place]) 06/12/2018  . Hypertension   . COPD (chronic obstructive pulmonary disease) ([place])   . GERD (gastroesophageal reflux disease) 04/06/2012  . Abdominal pain 04/06/2012  . Nausea & vomiting 04/06/2012  . Melena 04/06/2012   Past Medical History:  Past Medical History:  Diagnosis Date  . Anxiety   . Arthritis   . Asthma   . Bowel obstruction ([place])   . Chronic pain   . COPD (chronic obstructive pulmonary disease) ([place])   . Depression   . GERD (gastroesophageal reflux disease)   . Hypertension    Past Surgical History:  Past Surgical History:  Procedure Laterality Date  . ABDOMINAL SURGERY  2003   blockage  . left salpingectomy    . uterine ablation     Social History:  reports that she has been smoking cigarettes. She has a 10.00 pack-year smoking history. She has never used smokeless tobacco. She reports that she drinks alcohol. She reports that she does not use drugs.  Family / Support Systems Marital Status: Single Patient Roles: Parent, Other (Comment)(employee) Children: [name]-daughter [phone]-cell   Other Supports: [name] [phone]-home Anticipated Caregiver: Daughter and other family members Ability/Limitations of Caregiver: can physically assist if necessary Caregiver Availability: 24/7 Family Dynamics: Close with both of her daughter's oneis grown and the other is 20 yo and in [employer]. She has extended family and friends who are willing to  assist her, but she wants to be independent when she leaves here. She doesn't like asking for help and doesn't plan too.  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 She does want [name] involved also   Abuse/Neglect Abuse/Neglect Assessment Can Be Completed: Yes Physical Abuse: Denies Verbal Abuse: Denies Sexual Abuse: Denies Exploitation of patient/patient's resources: Denies Self-Neglect: Denies Possible abuse reported to:: Other (Comment)  Emotional Status Pt's affect, behavior adn adjustment status: Pt is motivated to do well and realizes she will need to take care of herself and go regularly to the MD for her health issues. She will try to quit smoking but knows it will be difficult. She is not one to sit still and this is killing her sitting around most of the day. Recent Psychosocial Issues: other health issues and non-compliance Pyschiatric History: History of anxiety-has ben on medications in the past but not now. Will ask neuro-psych to see while here for coping and address if medication may be helpful for her coping and anxiety Substance Abuse History: Tobacco-plans on quitting has started now, wearing a patch which is helping some. Aware of the community resources available to her for this.  Patient / Family Perceptions, Expectations & Goals Pt/Family understanding of illness & functional limitations: Pt  and daughter have a good understanding of her stroke and deficits. She does talk with the MD daily and feels she has a good understanding of her treatment plan going forward. She is hopeful she will  improve while here. Premorbid pt/family roles/activities: Mom, employee, neice, friend, etc Anticipated changes in roles/activities/participation: resume Pt/family expectations/goals: Pt states: " I want to be able to take care of myself before I leave here, I plan to anyway."  [name] states: " She is hard headed and will do what she wants to do, I hope she is independent when she leaves here."  [name]: None Premorbid Home Care/DME Agencies: None Transportation available at discharge: [employer] referrals recommended: Neuropsychology, Support group (specify)  Discharge Planning Living Arrangements: Children Support Systems: Children, Water engineer, Other relatives Type of Residence: Private residence Insurance Resources: Medicaid (specify county)(trying to get recertified while here) Museum/gallery curator Resources: Employment Financial Screen Referred: Yes Living Expenses: Own Money Management: Patient Does the patient have any problems obtaining your medications?: No(Doesn't go to the MD) Home Management: Family  Patient/Family Preliminary Plans: Return home or to daughter's home whcih ever is most convient, this will depend upon her progress here. At her daughter's home there are family members there who can assist her if needed. Will await team evaluations and work on best place for her. Sw Barriers to Discharge: Medication compliance Sw Barriers to Discharge Comments: non-compliant prior to admission no PCP Social Work Anticipated Follow Up Needs: HH/OP, Support Group  Clinical Impression Pleasant female who is not one who sits around usually she is always moving, this is difficult for her, asking others for assistance. She is motivated to improve and regain her independence before she leaves here. Her family is very supportive and involved and will assist at discharge if needed. Her 55 yo daughter is staying with her older daughter while she is here.  Do feel she would benefit from seeing neuro-psych while here, due to young age, history of anxiety and for coping. Will make referral to be seen. Will await therapy team evaluations.  Elease Hashimoto 06/18/2018, 11:03 AM

## 2018-06-18 NOTE — Evaluation (Signed)
Physical Therapy Assessment and Plan  Patient Details  Name: Judy Boyd MRN: [id number] Date of Birth: [birth year]  PT Diagnosis: Abnormality of gait, Coordination disorder, Difficulty walking, Hemiparesis dominant, Impaired cognition, Impaired sensation and Muscle weakness Rehab Potential: Good ELOS: 14-18 days   Today's Date: 06/18/2018 PT Individual Time: 0160-1093 PT Individual Time Calculation (min): 84 min    Problem List:  Patient Active Problem List   Diagnosis Date Noted  . Hemiparesis affecting right side as late effect of stroke ([place]) 06/17/2018  . Stroke (cerebrum) ([place]) 06/17/2018  . H/O medication noncompliance   . Chronic pain syndrome   . Leukocytosis   . Acute ischemic stroke ([place]) 06/13/2018  . Tobacco abuse 06/13/2018  . Lacunar stroke ([place]) 06/12/2018  . Hypertension   . COPD (chronic obstructive pulmonary disease) ([place])   . GERD (gastroesophageal reflux disease) 04/06/2012  . Abdominal pain 04/06/2012  . Nausea & vomiting 04/06/2012  . Melena 04/06/2012    Past Medical History:  Past Medical History:  Diagnosis Date  . Anxiety   . Arthritis   . Asthma   . Bowel obstruction ([place])   . Chronic pain   . COPD (chronic obstructive pulmonary disease) ([place])   . Depression   . GERD (gastroesophageal reflux disease)   . Hypertension    Past Surgical History:  Past Surgical History:  Procedure Laterality Date  . ABDOMINAL SURGERY  2003   blockage  . left salpingectomy    . uterine ablation      Assessment & Plan Clinical Impression: Patient is a 57 y.o. year old RH female with history of COPD, HTN, tobacco use who wasadmitted on 10/26-20 19 with 2-day history of right facial droop, dysarthria and right-sided weakness. CT of head done revealing low attenuation in left caudate head likely due to remote infarct. CTA head neck was negative for significant stenosis and incidental findings of soft tissue inflammation of right mandible as well as  extensive dental disease and right thyroid nodule noted. She was started on aspirin for secondary stroke prevention as well as Augmentin for odontogenic infection. 2D echo done showed revealing EF of 60 to 65% with moderate LVH and no thrombus or PFO. On 06/15/2018 patient developed increasing right-sided weakness felt to be due to extension of stroke. 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 Neurology felt that patient with recent worsening due to mass/edema effect. Stroke was felt to be due to small vessel disease and risk factor modification recommended. Plavix added to aspirin for secondary stroke prevention with recommendations to continue Plavix for 3 weeks followed by aspirin alone. Therapy evaluations done revealing functional deficits and CIR recommended for follow-up therapy.  Patient transferred to CIR on 06/17/2018 .   Patient currently requires mod with mobility secondary to muscle weakness, decreased cardiorespiratoy endurance, decreased coordination, and decreased standing balance, decreased postural control, hemiplegia and decreased balance strategies.  Prior to hospitalization, patient was independent  with mobility and lived with Alone in a Mobile home home.  Home access is 4Stairs to enter.  Patient will benefit from skilled PT intervention to maximize safe functional mobility, minimize fall risk and decrease caregiver burden for planned discharge home with 24 hour supervision.  Anticipate patient will benefit from follow up [place] at discharge.  PT - End of Session Activity Tolerance: Tolerates 30+ min activity with multiple rests Endurance Deficit: Yes Endurance Deficit Description: generalized deconditioning PT Assessment Rehab Potential  (ACUTE/IP ONLY): Good PT Barriers to Discharge: Decreased caregiver support;Lack of/limited family support PT Barriers to Discharge Comments: pt lives alone but states her daughter can come stay with her PT Patient demonstrates impairments in the following area(s): Balance;Endurance;Safety;Motor;Sensory PT Transfers Functional Problem(s): Bed Mobility;Bed to Chair;Car;Furniture PT Locomotion Functional Problem(s): Ambulation;Wheelchair Mobility;Stairs PT Plan PT Intensity: Minimum of 1-2 x/day ,45 to 90 minutes PT Frequency: 5 out of 7 days PT Duration Estimated Length of Stay: 14-18 days PT Treatment/Interventions: Cognitive remediation/compensation;Ambulation/gait training;Discharge planning;DME/adaptive equipment instruction;Functional mobility training;Pain management;Psychosocial support;Splinting/orthotics;Therapeutic Activities;UE/LE Strength taining/ROM;Visual/perceptual remediation/compensation;Wheelchair propulsion/positioning;UE/LE Coordination activities;Therapeutic Exercise;Stair training;Skin care/wound management;Patient/family education;Neuromuscular re-education;Functional electrical stimulation;Disease management/prevention;Community reintegration;Balance/vestibular training PT Transfers Anticipated Outcome(s): supervision with LRAD PT Locomotion Anticipated Outcome(s): supervision with LRAD PT Recommendation Recommendations for Other Services: Speech consult;Neuropsych consult;Therapeutic Recreation consult Therapeutic Recreation Interventions: Stress management;Outing/community reintergration Follow Up Recommendations: Home health PT;24 hour supervision/assistance Patient destination: Home Equipment Recommended: To be determined  Skilled Therapeutic Intervention Pt received in recliner & agreeable to tx. PT evaluation initiated with therapist educating pt on ELOS, daily therapy schedule, weekly interdisciplinary team meeting, educated her not to get up by herself & other  various CIR information. Completed manual testing, please see below. Discussed benefits of rails when negotiating stairs, with pt reporting difficulty installing them at her daughter's house but her daughter can come stay with her at the pt's house where she has a rail to hold. Pt completes recliner>w/c with mod assist via squat pivot with cuing for technique. Pt propels w/c with L UE (poor use of LLE despite max cuing for L hemi technique) with mod assist x 40 ft. Pt completes car transfer with mod assist with assistance to place RLE in/out of car. Pt ambulates 30 ft with rail with mod assist, activating R quads to initiate and assist with RLE advancement but no dorsiflexor activation noted and pt with A/P R knee instability with therapist blocking to prevent any buckling/hyperextension. Pt negotiates 12 steps with L rail and mod assist for RLE advancement and cuing for compensatory pattern. Pt completes sit>supine with mod assist for R UE/LE. While supine on mat table pt performs BLE bridging and RLE single leg bridging with therapist providing manual facilitation for neutral alignment and cuing for technique. Pt transitions into tall kneeling while engaging in card matching game with min<>mod assist for balance with cuing for full hip extension and task focusing on balance, core/trunk strengthening & control, and R LE NMR. Pt transfers supine>sitting EOB with min assist for RLE. In dayroom pt transfers to [facility] and utilizes device in sitting with task focusing on RLE strengthening & NMR. At end of session pt left sitting in recliner in room with chair alarm donned & all needs in reach.  PT Evaluation Precautions/Restrictions Precautions Precautions: Fall Precaution Comments: R hemi Restrictions Weight Bearing Restrictions: No Other Position/Activity Restrictions: R knee instability with weight bearing   General Chart Reviewed: Yes Additional Pertinent History: COPD, HTN, tobacco use, anxiety,  depression, arthritis, asthma, chronic pain Response to Previous Treatment: Patient with no complaints from previous session. Family/Caregiver Present: No   Pain Pt denies c/o pain.  Home Living/Prior Functioning Home Living Living Arrangements: Alone Type of Home: Mobile home Home Access: Stairs to enter Entrance Stairs-Number of Steps: 4 Entrance Stairs-Rails: Right;Left(can only reach 1 rail at a time) Home Layout: One level Lives With: Alone  Prior Function Level of Independence: Independent with basic ADLs;Independent with transfers;Independent with gait;Independent with homemaking with ambulation  Able to Take Stairs?: Yes Driving: Yes Vocation: Full time employment Vocation Requirements: worked at [employer] Leisure: Hobbies-yes (Comment) Comments: watching tv  Pt reports she has 4 steps with 1 rail to enter her mobile home but she can stay with her daughter if necessary & her daughter has 2 steps without rails to enter a one level home.   Vision/Perception  Pt reports she wears glasses for driving only at baseline. Pt denies changes in vision.  Cognition Overall Cognitive Status: Impaired/Different from baseline Arousal/Alertness: Awake/alert Orientation Level: Oriented X4  Sensation Sensation Light Touch: Appears Intact(BLE) Proprioception: Appears Intact(BLE) Additional Comments: pt reports tingling in R side of face Coordination Gross Motor Movements are Fluid and Coordinated: No(limited by weakness in RUE/RLE) Fine Motor Movements are Fluid and Coordinated: Yes(LLE, LUE)  Motor  Motor Motor: Hemiplegia;Abnormal postural alignment and control Motor - Skilled Clinical Observations: R hemiplegia, generalized deconditioning   Mobility Bed Mobility Bed Mobility: Supine to Sit;Sit to Supine Supine to Sit: Minimal Assistance - Patient > 75% Sit to Supine: Moderate Assistance - Patient 50-74% Transfers Transfers: Sit to Stand;Squat Pivot Transfers;Stand  Pivot Transfers Sit to Stand: Minimal Assistance - Patient > 75% Stand Pivot Transfers: Moderate Assistance - Patient 50 - 74% Stand Pivot Transfer Details: Verbal cues for sequencing;Verbal cues for technique;Tactile cues for weight shifting;Tactile cues for sequencing Squat Pivot Transfers: Moderate Assistance - Patient 50-74% Transfer (Assistive device): (manual faciltiation to prevent R knee instability)   Locomotion  Gait Ambulation: Yes Gait Assistance: Moderate Assistance - Patient 50-74% Gait Distance (Feet): 30 Feet Assistive device: (L rail) Gait Assistance Details: (assistance with RLE advancement, blocking at R knee to prevent hyperextension & buckling) Gait Gait: Yes Gait Pattern: Decreased step length - right;Decreased stride length;Decreased weight shift to right(impaired RLE dorsiflexion) Stairs / Additional Locomotion Stairs: Yes Stairs Assistance: Moderate Assistance - Patient 50 - 74% Stair Management Technique: One rail Left Number of Stairs: 12 Height of Stairs: (6" + 3") Wheelchair Mobility Wheelchair Mobility: Yes Wheelchair Assistance: Minimal assistance - Patient >75% Wheelchair Propulsion: Left upper extremity Wheelchair Parts Management: Needs assistance Distance: 40 ft    Trunk/Postural Assessment  Postural Control Postural Control: Deficits on evaluation Righting Reactions: delayed Protective Responses: delayed   Balance Balance Balance Assessed: Yes Static Sitting Balance Static Sitting - Balance Support: Feet supported Static Sitting - Level of Assistance: 5: Stand by assistance Dynamic Standing Balance Dynamic Standing - Balance Support: Left upper extremity supported;During functional activity Dynamic Standing - Level of Assistance: 3: Mod assist Dynamic Standing - Balance Activities: (during gait with L rail)   Extremity Assessment  RUE Assessment RUE Assessment: Exceptions to [facility] Active Range of Motion (AROM) Comments: shoulder  elevation, digit flexion/ext trace  LUE Assessment LUE Assessment: Within Functional Limits  RLE Assessment RLE Assessment: Exceptions to [facility] Passive Range of Motion (PROM) Comments: WFL Active Range of Motion (AROM) Comments: 1/5 hip flexion, 0/5 for ankle dorsiflexors, knee extensors  LLE Assessment LLE Assessment: Within Functional Limits    Refer to Care Plan for Long Term Goals  Recommendations for other services: Therapeutic Recreation  Stress management and Outing/community reintegration  Discharge Criteria: Patient will be discharged from PT if patient refuses treatment 3 consecutive times without medical reason, if treatment goals not met, if there is a change in medical status, if patient makes no progress towards goals or if patient is discharged from hospital.  The above assessment, treatment plan, treatment alternatives and goals were discussed and mutually agreed upon: by patient  Waunita Schooner 06/18/2018, 3:49 PM

## 2018-06-18 NOTE — Care Management Note (Signed)
[facility] Individual Statement of Services  Patient Name:  Judy Boyd  Date:  06/18/2018  Welcome to the [facility].  Our goal is to provide you with an individualized program based on your diagnosis and situation, designed to meet your specific needs.  With this comprehensive rehabilitation program, you will be expected to participate in at least 3 hours of rehabilitation therapies Monday-Friday, with modified therapy programming on the weekends.  Your rehabilitation program will include the following services:  Physical Therapy (PT), Occupational Therapy (OT), Speech Therapy (ST), 24 hour per day rehabilitation nursing, Therapeutic Recreaction (TR), Neuropsychology, Case Management (Social Worker), Rehabilitation Medicine, Nutrition Services and Pharmacy Services  Weekly team conferences will be held on Wednesday to discuss your progress.  Your Social Worker will talk with you frequently to get your input and to update you on team discussions.  Team conferences with you and your family in attendance may also be held.  Expected length of stay: 14-18 days Overall anticipated outcome: supervision with cueing  Depending on your progress and recovery, your program may change. Your Social Worker will coordinate services and will keep you informed of any changes. Your Social Worker's name and contact numbers are listed  below.  The following services may also be recommended but are not provided by the [facility] will be made to provide these services after discharge if needed.  Arrangements include referral to agencies that provide these services.  Your insurance has been verified to be:  medicaid Your primary doctor is:  None  Pertinent information will be shared with your doctor and  your insurance company.  Social Worker:  Ovidio Kin, [place] or ([phone]  Information discussed with and copy given to patient by: Elease Hashimoto, 06/18/2018, 10:04 AM

## 2018-06-18 NOTE — Evaluation (Signed)
Speech Language Pathology Assessment and Plan  Patient Details  Name: Judy Boyd MRN: [id number] Date of Birth: December 06, 1960  SLP Diagnosis: Speech and Language deficits  Rehab Potential: Good ELOS: 2 weeks    Today's Date: 06/18/2018 SLP Individual Time: 1230-1330 SLP Individual Time Calculation (min): 60 min   Problem List:  Patient Active Problem List   Diagnosis Date Noted  . Hemiparesis affecting right side as late effect of stroke ([place]) 06/17/2018  . Stroke (cerebrum) ([place]) 06/17/2018  . H/O medication noncompliance   . Chronic pain syndrome   . Leukocytosis   . Acute ischemic stroke ([place] [address]) 06/13/2018  . Tobacco abuse 06/13/2018  . Lacunar stroke ([place]) 06/12/2018  . Hypertension   . COPD (chronic obstructive pulmonary disease) ([place])   . GERD (gastroesophageal reflux disease) 04/06/2012  . Abdominal pain 04/06/2012  . Nausea & vomiting 04/06/2012  . Melena 04/06/2012   Past Medical History:  Past Medical History:  Diagnosis Date  . Anxiety   . Arthritis   . Asthma   . Bowel obstruction ([place])   . Chronic pain   . COPD (chronic obstructive pulmonary disease) ([place])   . Depression   . GERD (gastroesophageal reflux disease)   . Hypertension    Past Surgical History:  Past Surgical History:  Procedure Laterality Date  . ABDOMINAL SURGERY  2003   blockage  . left salpingectomy    . uterine ablation      Assessment / Plan / Recommendation Clinical Impression Judy Boyd is a RH female with history of COPD, HTN, tobacco use who was admitted on 10/26-20 19 with 2-day history of right facial droop, dysarthria and right-sided weakness.  CT of head done revealing low attenuation in left caudate head likely due to remote infarct.  CTA head neck was negative for significant stenosis and incidental findings of soft tissue inflammation of right mandible as well as extensive dental disease and right thyroid nodule noted.  She was started on aspirin for secondary  stroke prevention as well as Augmentin for odontogenic infection. On 06/15/2018 patient developed increasing right-sided weakness felt to be due to extension of stroke.  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  Neurology felt that patient with recent worsening due to mass/edema effect.  Stroke was felt to be due to small vessel disease.  Therapy evaluations done revealing functional deficits and CIR recommended for follow-up therapy. Pt admited to CIR on 06/17/18. Cognitive linguistic evaluation performed on 06/18/18.   Pt presents with functional cognitive abilities. She is oriented x 4, demonstrates good selective attention, appropriate problem solving abilities with semi-complex tasks, answer complex yes/no questions, recall information and answer questions related to intellectual awareness. She is not able to reason how her current physical deficits impact her ability to be independent. This is also further complicated by poor task tolerance, her overall frustration with having a stroke and her stubborn personality traits. Pt's independence is further impacted by her moderate dysarthria that is c/b imprecise staccto articulation, fast rate and decreased vocal intensity that reduce her speech intelligibility to ~ 75% at the phrase level. During unstructured conversation pt is largely unintelligible and lacks the ability to self-monitor and self correct. Pt also demonstrates very mild inconsistent word finding deficits when conveying more complex  information. Skilled ST is required to target speech intelligibility and improve anticipatory awareness of physical deficits. Anticipate that pt will likely require 24 hour supervision at discharge d/t physical deficits but not  likely to require follow up ST services.    Skilled Therapeutic Interventions          Skilled treatment session focused on completion of cognitive linguistic evaluation, see above. Standard cognitive linguistic evaluation not conducted d/t dysarthria. Extensive education provided on dysarthria, need to "over say" her words "loudly." Pt required Max A cues at the phrase level to achieve ~ 75% intelligibility. Pt with intermittent word substitutions that she is not aware of and would benefit from targeting within goal set. Although pt can demonstrate poor task tolerance and is stubborn, she was very agreeable to POC.    SLP Assessment  Patient will need skilled [facility] Services during CIR admission    Recommendations  Patient destination: Home Follow up Recommendations: 24 hour supervision/assistance Equipment Recommended: None recommended by SLP    SLP Frequency 3 to 5 out of 7 days   SLP Duration  SLP Intensity  SLP Treatment/Interventions 2 weeks  Minumum of 1-2 x/day, 30 to 90 minutes  Patient/family education;Speech/Language facilitation;Cognitive remediation/compensation;Therapeutic Activities    Pain Pain Assessment Pain Scale: 0-10 Pain Score: 0-No pain  Prior Functioning Cognitive/Linguistic Baseline: Within functional limits Type of Home: Mobile home  Lives With: Alone Available Help at Discharge: Family Vocation: Full time employment  Short Term Goals: Week 1: SLP Short Term Goal 1 (Week 1): Pt will utilize speech intelligibility startegies to increase intelligibility at the phrase level to ~ 75% with Mod A cues.  SLP Short Term Goal 2 (Week 1): Pt will self-monitor and self-correct unintelligible speech at the phrase level in 7 out of 10 opportunities with Mod A cues.  SLP Short Term Goal 3 (Week 1): Pt will demonstrate communicate information with supervision cues for word finding.  SLP Short Term Goal 4 (Week 1): Pt will demonstrate increased  anticipatory awareness by identifying safe vs unsafe activites to perform within room with Mod A cues.   Refer to Care Plan for Long Term Goals  Recommendations for other services: None   Discharge Criteria: Patient will be discharged from SLP if patient refuses treatment 3 consecutive times without medical reason, if treatment goals not met, if there is a change in medical status, if patient makes no progress towards goals or if patient is discharged from hospital.  The above assessment, treatment plan, treatment alternatives and goals were discussed and mutually agreed upon: by patient  Nadiyah Zeis 06/18/2018, 3:26 PM

## 2018-06-19 ENCOUNTER — Inpatient Hospital Stay (HOSPITAL_COMMUNITY): Payer: Medicaid Other

## 2018-06-19 ENCOUNTER — Inpatient Hospital Stay (HOSPITAL_COMMUNITY): Payer: Medicaid Other | Admitting: Physical Therapy

## 2018-06-19 DIAGNOSIS — I639 Cerebral infarction, unspecified: Secondary | ICD-10-CM

## 2018-06-19 DIAGNOSIS — I10 Essential (primary) hypertension: Secondary | ICD-10-CM

## 2018-06-19 NOTE — Progress Notes (Signed)
Physical Therapy Session Note  Patient Details  Name: Judy Boyd MRN: [id number] Date of Birth: 03-26-1961  Today's Date: 06/19/2018 PT Individual Time: 0800-0915 PT Individual Time Calculation (min): 75 min   Short Term Goals: Week 1:  PT Short Term Goal 1 (Week 1): Pt will ambulate 50 ft with LRAD & min assist. PT Short Term Goal 2 (Week 1): Pt will negotiate 12 steps with 1 rail with min assist.  Skilled Therapeutic Interventions/Progress Updates:  Pt was seen bedside in the am. Pt transferred supine to edge of bed with side rail and c/g with verbal cues. Pt tolerated edge of bed about 10 minutes with close supervision to min A with verbal cues. Donned pt's clothes at edge of bed. Pt transferred edge of bed to w/c with mod A and verbal cues. Pt propelled w/c about 120 feet with L UE and L LE with verbal cues and S. Pt performed multiple sit to stand transfers with hemiwalker and without hemiwalker with min A and verbal cues. Pt performed multiple stand pivot transfers with hemiwalker and min to mod A with verbal cues. In gym focused on strengthening, performing 3 sets x 10 reps minim squats and sit to stands 3 sets x 5 with 2" block under L foot. Pt ambulated 80 feet x 2 with hemiwalker and mod A with verbal cues. Pt returned to room following treatment. Pt transferred to recliner with mod A and verbal cues. Pt left sitting up in recliner with chair alarm in place and call bell within reach.   Therapy Documentation Precautions:  Precautions Precautions: Fall Precaution Comments: R hemi Restrictions Weight Bearing Restrictions: No Other Position/Activity Restrictions: R knee instability with weight bearing General:   Pain: Pain Assessment Pain Scale: 0-10 Pain Score: 0-No pain  Therapy/Group: Individual Therapy  Dub Amis 06/19/2018, 12:00 PM

## 2018-06-19 NOTE — Progress Notes (Signed)
Timed toileting-continent thus far. Slept good. Judy Boyd

## 2018-06-19 NOTE — Progress Notes (Signed)
Occupational Therapy Session Note  Patient Details  Name: Judy Boyd MRN: [id number] Date of Birth: December 06, 1960  Today's Date: 06/19/2018 OT Individual Time: 0335-3317 OT Individual Time Calculation (min): 57 min    Short Term Goals: Week 1:  OT Short Term Goal 1 (Week 1): pt will don bra wiht min A OT Short Term Goal 2 (Week 1): Pt will complete toilet tranfer wiht CGA OT Short Term Goal 3 (Week 1): Pt will completes bathing sit to stand wiht CGA OT Short Term Goal 4 (Week 1): Pt will don shoes wiht supervision  Skilled Therapeutic Interventions/Progress Updates:    1;1. Pt received in bed. Supine>sitting EOB with supervision. Pt stand pivot transfer no AD with min A and VC for R foot placement prior to transfer. OT installs shoe butons and demo use with tape on shoe lace for visual cue to know which strand to attach to button. Pt completes mirror therapy 30 per exercise: wrist flex/ext, ulnar/radial deviation, sup/pronation, composite grasp/release, single digit extension, digit ab/adduct, thumb flex/ext and ab/adduct with VC for attention to reflection not LUE. Pt completes  Arm skate with max A for full ROM trace activation in scapula for elevation/protraction and retraction and elbow flexion. Exited session with pt seated in recliner, call light in reach and all needs met  Therapy Documentation Precautions:  Precautions Precautions: Fall Precaution Comments: R hemi Restrictions Weight Bearing Restrictions: No Other Position/Activity Restrictions: R knee instability with weight bearing   Therapy/Group: Individual Therapy  Tonny Branch 06/19/2018, 3:08 PM

## 2018-06-19 NOTE — Plan of Care (Signed)
  Problem: Consults Goal: RH STROKE PATIENT EDUCATION Description See Patient Education module for education specifics  Outcome: Progressing Goal: Nutrition Consult-if indicated Outcome: Progressing   Problem: RH BOWEL ELIMINATION Goal: RH STG MANAGE BOWEL W/MEDICATION W/ASSISTANCE Description STG Manage Bowel with Medication with min.Assistance.  Outcome: Progressing   Problem: RH BLADDER ELIMINATION Goal: RH STG MANAGE BLADDER WITH ASSISTANCE Description STG Manage Bladder With min. Assistance  Outcome: Progressing   Problem: RH SKIN INTEGRITY Goal: RH STG SKIN FREE OF INFECTION/BREAKDOWN Description With min. assisst  Outcome: Progressing Goal: RH STG MAINTAIN SKIN INTEGRITY WITH ASSISTANCE Description STG Maintain Skin Integrity With min.Assistance.  Outcome: Progressing   Problem: RH SAFETY Goal: RH STG ADHERE TO SAFETY PRECAUTIONS W/ASSISTANCE/DEVICE Description STG Adhere to Safety Precautions With min. Assistance/Device.  Outcome: Progressing Goal: RH STG DECREASED RISK OF FALL WITH ASSISTANCE Description STG Decreased Risk of Fall With Mod.Assistance.  Outcome: Progressing   Problem: RH PAIN MANAGEMENT Goal: RH STG PAIN MANAGED AT OR BELOW PT'S PAIN GOAL Description Less than 3.  Outcome: Progressing   Problem: RH KNOWLEDGE DEFICIT Goal: RH STG INCREASE KNOWLEDGE OF HYPERTENSION Description With min. assisst.  Outcome: Progressing Goal: RH STG INCREASE KNOWLEDGE OF STROKE PROPHYLAXIS Description With min. assist.  Outcome: Progressing

## 2018-06-19 NOTE — Progress Notes (Signed)
Patient ID: Judy Boyd, female   DOB: Oct 11, 1960, 57 y.o.   MRN: [id number]   Judy Boyd is a 57 y.o. female who is admitted for [facility] with functional deficits secondary to  left pontine and thalamic infarcts.  Past Medical History:  Diagnosis Date  . Anxiety   . Arthritis   . Asthma   . Bowel obstruction ([place])   . Chronic pain   . COPD (chronic obstructive pulmonary disease) ([place])   . Depression   . GERD (gastroesophageal reflux disease)   . Hypertension      Subjective: No new complaints. No new problems. Slept well.   Objective: Vital signs in last 24 hours: Temp:  [98.2 F (36.8 C)-98.5 F (36.9 C)] 98.2 F (36.8 C) (11/02 0527) Pulse Rate:  [68-78] 78 (11/02 0527) Resp:  [16-18] 18 (11/02 0527) BP: (126-143)/(73-81) 126/81 (11/02 0527) SpO2:  [99 %-100 %] 99 % (11/02 0527) Weight change:  Last BM Date: 06/18/18  Intake/Output from previous day: 11/01 0701 - 11/02 0700 In: 600 [P.O.:600] Out: -   Patient Vitals for the past 24 hrs:  BP Temp Pulse Resp SpO2  06/19/18 0527 126/81 98.2 F (36.8 C) 78 18 99 %  06/18/18 2041 - - 72 16 100 %  06/18/18 2012 (!) 143/73 98.4 F (36.9 C) 78 - 100 %  06/18/18 1400 136/77 98.5 F (36.9 C) 68 18 99 %    Physical Exam General: No apparent distress   HEENT: not dry Lungs: Normal effort. Lungs clear to auscultation, no crackles or wheezes. Cardiovascular: Regular rate and rhythm, no edema Abdomen: S/NT/ND; BS(+) Musculoskeletal:  unchanged Neurological: No new neurological deficits with right-sided weakness Wounds: N/A    Skin: clear   Mental state: Alert, oriented, cooperative    Lab Results: BMET    Component Value Date/Time   NA 141 06/18/2018 0505   K 4.5 06/18/2018 0505   CL 109 06/18/2018 0505   CO2 27 06/18/2018 0505   GLUCOSE 95 06/18/2018 0505   BUN 23 (H) 06/18/2018 0505   CREATININE 0.77 06/18/2018 0505   CREATININE 0.83 04/06/2012 1430   CALCIUM 9.5 06/18/2018 0505   GFRNONAA >60  06/18/2018 0505   GFRAA >60 06/18/2018 0505   CBC    Component Value Date/Time   WBC 10.8 (H) 06/18/2018 0505   RBC 4.11 06/18/2018 0505   HGB 11.2 (L) 06/18/2018 0505   HCT 38.5 06/18/2018 0505   PLT 310 06/18/2018 0505   MCV 93.7 06/18/2018 0505   MCH 27.3 06/18/2018 0505   MCHC 29.1 (L) 06/18/2018 0505   RDW 12.3 06/18/2018 0505   LYMPHSABS 4.4 (H) 06/18/2018 0505   MONOABS 0.7 06/18/2018 0505   EOSABS 0.2 06/18/2018 0505   BASOSABS 0.0 06/18/2018 0505    Medications: I have reviewed the patient's current medications.  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  Continue Lovenox Essential hypertension.  Blood pressure presently well controlled COPD stable    Length of stay, days: 2  Marletta Lor , MD 06/19/2018, 11:59 AM

## 2018-06-19 NOTE — Progress Notes (Signed)
Physical Therapy Session Note  Patient Details  Name: Judy Boyd MRN: [id number] Date of Birth: 1960/11/30  Today's Date: 06/19/2018 PT Individual Time: 3382-5053 PT Individual Time Calculation (min): 29 min   Short Term Goals: Week 1:  PT Short Term Goal 1 (Week 1): Pt will ambulate 50 ft with LRAD & min assist. PT Short Term Goal 2 (Week 1): Pt will negotiate 12 steps with 1 rail with min assist.  Skilled Therapeutic Interventions/Progress Updates:  Pt was seen bedside in the pm. Pt performed multiple sit to stand with min to mod A. While standing focused on upright posture, weight shifting and midline position. Pt ambulated 150 feet with hemiwalker and mod A with verbal cues. Pt left sitting up in recliner with chair alarm in place and call bell within reach.   Therapy Documentation Precautions:  Precautions Precautions: Fall Precaution Comments: R hemi Restrictions Weight Bearing Restrictions: No Other Position/Activity Restrictions: R knee instability with weight bearing General:  Pain: Pain Assessment Pain Scale: 0-10 Pain Score: 0-No pain    Therapy/Group: Individual Therapy  Dub Amis 06/19/2018, 12:16 PM

## 2018-06-19 NOTE — Progress Notes (Signed)
Speech Language Pathology Daily Session Note  Patient Details  Name: Judy Boyd MRN: [id number] Date of Birth: Jun 22, 1961  Today's Date: 06/19/2018 SLP Individual Time: 1345-1415 SLP Individual Time Calculation (min): 30 min  Short Term Goals: Week 1: SLP Short Term Goal 1 (Week 1): Pt will utilize speech intelligibility startegies to increase intelligibility at the phrase level to ~ 75% with Mod A cues.  SLP Short Term Goal 2 (Week 1): Pt will self-monitor and self-correct unintelligible speech at the phrase level in 7 out of 10 opportunities with Mod A cues.  SLP Short Term Goal 3 (Week 1): Pt will demonstrate communicate information with supervision cues for word finding.  SLP Short Term Goal 4 (Week 1): Pt will demonstrate increased anticipatory awareness by identifying safe vs unsafe activites to perform within room with Mod A cues.   Skilled Therapeutic Interventions:Skilled ST services focused on speech and cognitive skills. SLP facilitated use of speech intelligibility strategies (over-articulation and slow rate) at phrase level utilizing communication barrier task , pt required mod A verbal cues for speech intelligibility with 60 % accuracy and min A verbal cues for word finding. Pt demonstrated awareness of verbal errors in structred task  with max-mod A verbal cues. SLP facilitated anticipatory awareness skills, verbalizing  safe verse unsafe activties in room, pt required min A verbal cues. Pt was left in room with call bell within reach and bed alarm set. SLP reccomends to continue skilled services.     Pain Pain Assessment Pain Score: 0-No pain  Therapy/Group: Individual Therapy  Judy Boyd  [facility] 06/19/2018, 4:02 PM

## 2018-06-20 ENCOUNTER — Inpatient Hospital Stay (HOSPITAL_COMMUNITY): Payer: Medicaid Other

## 2018-06-20 DIAGNOSIS — I69351 Hemiplegia and hemiparesis following cerebral infarction affecting right dominant side: Principal | ICD-10-CM

## 2018-06-20 NOTE — Progress Notes (Signed)
Timed toileting-Continent X 2 & incontinent X1. Call ing for assistance.Judy Boyd A

## 2018-06-20 NOTE — Progress Notes (Signed)
Occupational Therapy Session Note  Patient Details  Name: Judy Boyd MRN: [id number] Date of Birth: [birth year]  Today's Date: 06/20/2018 OT Individual Time: 9024-0973 OT Individual Time Calculation (min): 60 min    Short Term Goals: Week 1:  OT Short Term Goal 1 (Week 1): pt will don bra wiht min A OT Short Term Goal 2 (Week 1): Pt will complete toilet tranfer wiht CGA OT Short Term Goal 3 (Week 1): Pt will completes bathing sit to stand wiht CGA OT Short Term Goal 4 (Week 1): Pt will don shoes wiht supervision  Skilled Therapeutic Interventions/Progress Updates:    1;1.Pt recived seated in recliner. OT obtains half lap tray for proper RUE positioning in w/c Pt completes all functional transfer with CGA and VC for foot placement prior to transfer. Pt completes lateral leaning activity onto R forearm for deep proprioceptive input and NMR while reaching for clothes pins with LUE and placing laterally to L on basketball hoop. Pt taps L elbow onto mat for trunk control and R oblique strengthening. Pt completes 1x10  Sit up with knees braced from wedge seated on edge of mat. Pt completes various standing activities in front of mirror at one side of parallel bars completing 2x10 mini squats, anterior toe taps, and sliding RUE across bar with washcloth with CGA for balance and up to MOD A for sliding wash cloth. In ADL apartment OT educates on TTB transfer, bathroom adaptations and shower curtain management with TTB. Pt completes transfer with MIN A for pivot/manage RLE over ledge. Exited session with pt seated in w/c with half lap tray, call light in reach and belt alarm on  Therapy Documentation Precautions:  Precautions Precautions: Fall Precaution Comments: R hemi Restrictions Weight Bearing Restrictions: No Other Position/Activity Restrictions: R knee instability with weight bearing General:    Therapy/Group: Individual Therapy  Tonny Branch 06/20/2018, 4:04 PM

## 2018-06-20 NOTE — Plan of Care (Signed)
  Problem: Consults Goal: RH STROKE PATIENT EDUCATION Description See Patient Education module for education specifics  Outcome: Progressing Goal: Nutrition Consult-if indicated Outcome: Progressing   Problem: RH BOWEL ELIMINATION Goal: RH STG MANAGE BOWEL W/MEDICATION W/ASSISTANCE Description STG Manage Bowel with Medication with min.Assistance.  Outcome: Progressing   Problem: RH BLADDER ELIMINATION Goal: RH STG MANAGE BLADDER WITH ASSISTANCE Description STG Manage Bladder With min. Assistance  Outcome: Progressing   Problem: RH SKIN INTEGRITY Goal: RH STG SKIN FREE OF INFECTION/BREAKDOWN Description With min. assisst  Outcome: Progressing Goal: RH STG MAINTAIN SKIN INTEGRITY WITH ASSISTANCE Description STG Maintain Skin Integrity With min.Assistance.  Outcome: Progressing   Problem: RH SAFETY Goal: RH STG ADHERE TO SAFETY PRECAUTIONS W/ASSISTANCE/DEVICE Description STG Adhere to Safety Precautions With min. Assistance/Device.  Outcome: Progressing Goal: RH STG DECREASED RISK OF FALL WITH ASSISTANCE Description STG Decreased Risk of Fall With Mod.Assistance.  Outcome: Progressing   Problem: RH PAIN MANAGEMENT Goal: RH STG PAIN MANAGED AT OR BELOW PT'S PAIN GOAL Description Less than 3.  Outcome: Progressing   Problem: RH KNOWLEDGE DEFICIT Goal: RH STG INCREASE KNOWLEDGE OF HYPERTENSION Description With min. assisst.  Outcome: Progressing Goal: RH STG INCREASE KNOWLEDGE OF STROKE PROPHYLAXIS Description With min. assist.  Outcome: Progressing

## 2018-06-20 NOTE — Progress Notes (Signed)
Patient ID: Judy Boyd, female   DOB: 1961/01/27, 57 y.o.   MRN: [id number]   Judy Boyd is a 57 y.o. female who is admitted for CIR with functional deficits following  left pontine and thalamic infarcts.  Past Medical History:  Diagnosis Date  . Anxiety   . Arthritis   . Asthma   . Bowel obstruction ([place])   . Chronic pain   . COPD (chronic obstructive pulmonary disease) ([place])   . Depression   . GERD (gastroesophageal reflux disease)   . Hypertension      Subjective: No new complaints. No new problems.  Asking about the advisability of substituting vaping rather than tobacco use  Objective: Vital signs in last 24 hours: Temp:  [97.8 F (36.6 C)-98.1 F (36.7 C)] 97.8 F (36.6 C) (11/03 0520) Pulse Rate:  [67-76] 67 (11/03 0520) Resp:  [16-20] 16 (11/03 0520) BP: (126-143)/(75-90) 138/90 (11/03 0520) SpO2:  [94 %-100 %] 96 % (11/03 0943) Weight change:  Last BM Date: 06/20/18  Intake/Output from previous day: 11/02 0701 - 11/03 0700 In: 820 [P.O.:820] Out: -   Patient Vitals for the past 24 hrs:  BP Temp Pulse Resp SpO2  06/20/18 0943 - - - - 96 %  06/20/18 0520 138/90 97.8 F (36.6 C) 67 16 99 %  06/19/18 2037 - - - - 94 %  06/19/18 1938 (!) 143/89 - 76 18 100 %  06/19/18 1535 126/75 98.1 F (36.7 C) 73 20 100 %     Physical Exam General: No apparent distress   HEENT: Moist Lungs: Normal effort. Lungs clear to auscultation, no crackles or wheezes. Cardiovascular: Regular rate and rhythm, no edema Abdomen: S/NT/ND; BS(+) Musculoskeletal:  unchanged Neurological: No new neurological deficits with right-sided weakness Wounds: N/A    Skin: clear   Mental state: Alert, oriented, cooperative    Lab Results: BMET    Component Value Date/Time   NA 141 06/18/2018 0505   K 4.5 06/18/2018 0505   CL 109 06/18/2018 0505   CO2 27 06/18/2018 0505   GLUCOSE 95 06/18/2018 0505   BUN 23 (H) 06/18/2018 0505   CREATININE 0.77 06/18/2018 0505   CREATININE 0.83 04/06/2012 1430   CALCIUM 9.5 06/18/2018 0505   GFRNONAA >60 06/18/2018 0505   GFRAA >60 06/18/2018 0505   CBC    Component Value Date/Time   WBC 10.8 (H) 06/18/2018 0505   RBC 4.11 06/18/2018 0505   HGB 11.2 (L) 06/18/2018 0505   HCT 38.5 06/18/2018 0505   PLT 310 06/18/2018 0505   MCV 93.7 06/18/2018 0505   MCH 27.3 06/18/2018 0505   MCHC 29.1 (L) 06/18/2018 0505   RDW 12.3 06/18/2018 0505   LYMPHSABS 4.4 (H) 06/18/2018 0505   MONOABS 0.7 06/18/2018 0505   EOSABS 0.2 06/18/2018 0505   BASOSABS 0.0 06/18/2018 0505    Medications: I have reviewed the patient's current medications.  Assessment/Plan:  Functional deficits following left pontine and thalamic infarctions with right-sided weakness Essential hypertension.  Controlled History of COPD History of tobacco use.  Continue nicotine patch DVT prophylaxis.  Continue Lovenox    Length of stay, days: 3  Marletta Lor , MD 06/20/2018, 10:22 AM

## 2018-06-21 ENCOUNTER — Inpatient Hospital Stay (HOSPITAL_COMMUNITY): Payer: Medicaid Other | Admitting: Physical Therapy

## 2018-06-21 ENCOUNTER — Inpatient Hospital Stay (HOSPITAL_COMMUNITY): Payer: Medicaid Other | Admitting: Occupational Therapy

## 2018-06-21 ENCOUNTER — Inpatient Hospital Stay (HOSPITAL_COMMUNITY): Payer: Medicaid Other

## 2018-06-21 NOTE — Progress Notes (Addendum)
PHYSICAL MEDICINE & REHABILITATION PROGRESS NOTE   Subjective/Complaints:  No issues overnite , working with OT  ROS- neg for CP, SOB, N/V/D Objective:   No results found. No results for input(s): WBC, HGB, HCT, PLT in the last 72 hours. No results for input(s): NA, K, CL, CO2, GLUCOSE, BUN, CREATININE, CALCIUM in the last 72 hours.  Intake/Output Summary (Last 24 hours) at 06/21/2018 0807 Last data filed at 06/20/2018 2300 Gross per 24 hour  Intake 1040 ml  Output -  Net 1040 ml     Physical Exam: Vital Signs Blood pressure (!) 143/101, pulse 89, temperature 98.3 F (36.8 C), resp. rate 18, height '5\' 6"'$  (1.676 m), weight 58.2 kg, SpO2 97 %.  General: No acute distress Mood and affect are appropriate Heart: Regular rate and rhythm no rubs murmurs or extra sounds Lungs: Clear to auscultation, breathing unlabored, no rales or wheezes Abdomen: Positive bowel sounds, soft nontender to palpation, nondistended Extremities: No clubbing, cyanosis, or edema Skin: No evidence of breakdown, no evidence of rash Neurologic: Cranial nerves II through XII intact, motor strength is 5/5 in bilateral deltoid, bicep, tricep, grip, hip flexor, knee extensors, ankle dorsiflexor and plantar flexor Sensory exam normal sensation to light touch and proprioception in bilateral upper and lower extremities Cerebellar exam normal finger to nose to finger as well as heel to shin in bilateral upper and lower extremities Musculoskeletal: Full range of motion in all 4 extremities. No joint swelling    Assessment/Plan: 1. Functional deficits secondary to Left pontine and thalamic infarcts which require 3+ hours per day of interdisciplinary therapy in a comprehensive inpatient rehab setting.  Physiatrist is providing close team supervision and 24 hour management of active medical problems listed below.  Physiatrist and rehab team continue to assess barriers to discharge/monitor patient progress  toward functional and medical goals  Care Tool:  Bathing    Body parts bathed by patient: Right arm, Left arm, Chest, Abdomen, Front perineal area, Buttocks, Right upper leg, Left upper leg, Right lower leg, Left lower leg, Face         Bathing assist Assist Level: Minimal Assistance - Patient > 75%     Upper Body Dressing/Undressing Upper body dressing   What is the patient wearing?: Hospital gown only    Upper body assist Assist Level: Moderate Assistance - Patient 50 - 74%    Lower Body Dressing/Undressing Lower body dressing      What is the patient wearing?: Hospital gown only     Lower body assist Assist for lower body dressing: Moderate Assistance - Patient 50 - 74%     Toileting Toileting Toileting Activity did not occur (Clothing management and hygiene only): N/A (no void or bm)  Toileting assist Assist for toileting: Moderate Assistance - Patient 50 - 74%     Transfers Chair/bed transfer  Transfers assist     Chair/bed transfer assist level: Moderate Assistance - Patient 50 - 74%     Locomotion Ambulation   Ambulation assist      Assist level: Moderate Assistance - Patient 50 - 74% Assistive device: Walker-hemi Max distance: 150   Walk 10 feet activity   Assist     Assist level: Moderate Assistance - Patient - 50 - 74% Assistive device: Walker-hemi   Walk 50 feet activity   Assist    Assist level: Moderate Assistance - Patient - 50 - 74% Assistive device: Walker-hemi    Walk 150 feet activity   Assist  Assist level: Moderate Assistance - Patient - 50 - 74% Assistive device: Walker-hemi    Walk 10 feet on uneven surface  activity   Assist     Assist level: Dependent - Patient 0%     Wheelchair     Assist Will patient use wheelchair at discharge?: (TBD) Type of Wheelchair: Manual    Wheelchair assist level: Supervision/Verbal cueing Max wheelchair distance: 120    Wheelchair 50 feet with 2 turns  activity    Assist    Wheelchair 50 feet with 2 turns activity did not occur: Safety/medical concerns   Assist Level: Supervision/Verbal cueing   Wheelchair 150 feet activity     Assist Wheelchair 150 feet activity did not occur: Safety/medical concerns           Medical Problem List and Plan: 1.   Right hemiparesis, dysarthria secondary to left pontine and left thalamic infarcts, around 06/10/2018 aspirin and Plavix CIR PT, OT 2. DVT Prophylaxis/Anticoagulation: Pharmaceutical: Lovenox 3. Pain Management: Tylenol 4. Mood: We will monitor, may need neuropsych consult, trazodone for sleep- mood flat 5. Neuropsych: This patient is capable of making decisions on her own behalf. 6. Skin/Wound Care: No areas identified we will continue to monitor 7. Fluids/Electrolytes/Nutrition: Recheck C met, monitor I's and O's 8.  COPD Dulera inhaler 2 puffs twice a day, Singulair 10 mg at bedtime 9.  History of hypertension resume verapamil if pressures remain elevated 1 to 2 weeks post stroke Vitals:   06/20/18 2116 06/21/18 0625  BP:  (!) 143/101  Pulse:  89  Resp:  18  Temp:  98.3 F (36.8 C)  SpO2: 94% 97%  within permissive range cont to monitor  10.  Mild leukocytosis , improving , afebrile cont to monitor 11. Mild anemia, no obvious sign of blood loss ,  stool guaic Neg LOS: 4 days A FACE TO FACE EVALUATION WAS PERFORMED  Charlett Blake 06/21/2018, 8:07 AM

## 2018-06-21 NOTE — Progress Notes (Signed)
Occupational Therapy Session Note  Patient Details  Name: Judy Boyd MRN: [id number] Date of Birth: Nov 21, 1960  Today's Date: 06/21/2018 OT Individual Time: 3568-6168 OT Individual Time Calculation (min): 58 min    Short Term Goals: Week 1:  OT Short Term Goal 1 (Week 1): pt will don bra wiht min A OT Short Term Goal 2 (Week 1): Pt will complete toilet tranfer wiht CGA OT Short Term Goal 3 (Week 1): Pt will completes bathing sit to stand wiht CGA OT Short Term Goal 4 (Week 1): Pt will don shoes wiht supervision  Skilled Therapeutic Interventions/Progress Updates:    Treatment session with focus on ADL retraining with stand pivot transfers, dynamic standing, and functional use of RUE.  Pt received supine in bed expressing desire to bathe at shower level.  Pt completed bed mobility with min guard and stand pivot transfers bed > w/c > tub bench in room shower with min assist and cues for attention to and placement of RLE prior to transfers.  Completed bathing at sit > stand level with therapist providing hand over hand assist to incorporate RUE into bathing.  Blocking at Rt knee in standing and assistance for trunk control while pt washed buttocks.  Pt demonstrating good recall of hemi-dressing technique, requiring assistance to pull bra over back and then pull pants over hip on Rt side.  Pt donned socks and shoes in figure 4 position with only steady assist to maintain RLE over Lt knee.  Engaged in [place] in standing with focus on WB through [place] while reaching across arm to increase facilitation of WB and NMR.  Post WB, engaged in towel glides with focus on shoulder flexion/extension and horizontal abduction/adduction.  Provided pt with Self-ROM handout and educated pt on shoulder flexion/extension, abduction/adduction, and internal/external rotation with pt reporting and demonstrating understanding.  Will continue to address self-ROM and functional use of RUE.  Pt passed off to SLP.    Therapy Documentation Precautions:  Precautions Precautions: Fall Precaution Comments: R hemi Restrictions Weight Bearing Restrictions: No Other Position/Activity Restrictions: R knee instability with weight bearing General:   Vital Signs: Therapy Vitals Temp: 98.3 F (36.8 C) Pulse Rate: 89 Resp: 18 BP: (!) 143/101 Patient Position (if appropriate): Sitting Oxygen Therapy SpO2: 97 % O2 Device: Room Air Pain: Pain Assessment Pain Scale: 0-10 Pain Score: 0-No pain   Therapy/Group: Individual Therapy  Simonne Come 06/21/2018, 9:12 AM

## 2018-06-21 NOTE — Progress Notes (Signed)
Speech Language Pathology Daily Session Note  Patient Details  Name: ANALEIGH ARIES MRN: [id number] Date of Birth: 12/05/1960  Today's Date: 06/21/2018 SLP Individual Time: 0900-1000 SLP Individual Time Calculation (min): 60 min  Short Term Goals: Week 1: SLP Short Term Goal 1 (Week 1): Pt will utilize speech intelligibility startegies to increase intelligibility at the phrase level to ~ 75% with Mod A cues.  SLP Short Term Goal 2 (Week 1): Pt will self-monitor and self-correct unintelligible speech at the phrase level in 7 out of 10 opportunities with Mod A cues.  SLP Short Term Goal 3 (Week 1): Pt will demonstrate communicate information with supervision cues for word finding.  SLP Short Term Goal 4 (Week 1): Pt will demonstrate increased anticipatory awareness by identifying safe vs unsafe activites to perform within room with Mod A cues.   Skilled Therapeutic Interventions:Skilled ST services focused on speech skills. Pt demonstrated recall of speech intelligibility strategies with supervision A verbal cues. SLP facilitated speech intelligibility skills at phrase level in structured task, picture description and communication barrier task, pt demonstrated 70% intelligibility with mod A verbal cues, however in unstructured tasks, simple conversation and placing meal order, demonstrated 60% intelligibility with mod A verbal cues in quiet environments. Pt demonstrated increase awareness of verbal errors, self-correcting with  Mod A verbal cues. SLP facilitated anticipatory awareness, pt listed activities that were safe verse unsafe to perform in room with min A verbal cues. Pt was left in room with call bell within reach and chair alarm set. Recommend to continue skilled ST services.      Pain Pain Assessment Pain Scale: 0-10 Pain Score: 0-No pain  Therapy/Group: Individual Therapy  Urijah Arko  [facility] 06/21/2018, 11:25 AM

## 2018-06-21 NOTE — Progress Notes (Addendum)
Physical Therapy Session Note  Patient Details  Name: Judy Boyd MRN: [id number] Date of Birth: 1961/03/14  Today's Date: 06/21/2018 PT Individual Time: 2683-4196 and 2229-7989 PT Individual Time Calculation (min): 70 min and 24 min  Short Term Goals: Week 1:  PT Short Term Goal 1 (Week 1): Pt will ambulate 50 ft with LRAD & min assist. PT Short Term Goal 2 (Week 1): Pt will negotiate 12 steps with 1 rail with min assist.  Skilled Therapeutic Interventions/Progress Updates:  Treatment 1: Pt received in w/c & agreeable to tx. No c/o pain reported. Pt propels w/c room<>gym with L hemi technique and supervision. Pt transfers w/c>mat via stand pivot with mod assist. Provided pt with RW & R hand orthosis and educated pt on it's use. Pt stands and attempts step with RW & demonstrates impaired R dorsiflexion, impaired R quad strength & impaired control of RLE. Provided pt with GRAFO and pt ambulates 38 ft + 38 ft with RW & R AFO with up to max assist 2/2 LOB and assistance to correct. With use of R AFO pt demonstrates more normalized gait pattern with less hip flexion compensation and slight R heel strike, as well as R AFO preventing R knee buckling. Therapist provides manual facilitation L for increased foot clearance R. From EOM with LLE elevated on 2" step pt performed sit<>stand transfers with mod assist for balance with task focusing on anterior/R weight shifting, RLE strengthening & NMR, and terminal knee extension in RLE when in standing with pt able to return demonstrate with visible effort. Pt utilized cybex kinetron in sitting then standing with BUE support (therapist assisting RUE) and providing tactile cuing to prevent R knee buckling, cuing for weight shifting & terminal knee extension with RLE with pt requiring rest breaks 2/2 fatigue. At rail in hallway pt performed side steps with mod assist for balance & therapist assisting with RUE support with demonstration for technique, and cuing  for increased RLE flexion with task focusing on RLE strengthening, NMR, balance, and weight shifting. Back in room pt ambulates w/c>recliner with assistive devices and mod assist with cuing for RLE advancement. At end of session pt left sitting in recliner with alarm donned & all needs in reach.  Therapist educated pt on stroke recovery as pt reports she is not seeing as much progress as she would like, and also provided encouragement throughout session.    Treatment 2: Pt received in recliner & agreeable to tx. No c/o pain reported. Pt ambulates recliner>w/c with RW & R hand orthosis and R AFO with min/mod assist with cuing for RLE advancement and cuing for turning. Transported pt to/from gym via w/c dependent assist for time management. Gait with RW, R hand orthosis and R AFO with mod assist x 28 ft with pt beginning to abduct RLE as she demonstrates difficulty clearing RLE during swing phase despite therapist providing manual facilitation for weight shifting L to increase ease of R foot advancement. Applied shoe cover to RLE for increased ease of foot clearance with pt able to ambulate 40 ft with mod assist with this additional modification and less RLE compensatory abduction noted. Attempted lateral step ups on 3" step with LUE support and max assist for balance, with pt able to ascend step but difficulty lifting RLE to descend step and was safely assisted to sitting in w/c. Returned pt to room and pt ambulates to bed in same manner as noted above. Educated pt on doffing R AFO & shoe but pt  requires assistance 2/2 difficulty with AFO. Pt transfers sit>supine with min assist for RLE. Pt left in bed with alarm set & all needs in reach.   Therapy Documentation Precautions:  Precautions Precautions: Fall Precaution Comments: R hemi Restrictions Weight Bearing Restrictions: No Other Position/Activity Restrictions: R knee instability with weight bearing   Therapy/Group: Individual  Therapy  Waunita Schooner 06/21/2018, 3:38 PM

## 2018-06-22 ENCOUNTER — Inpatient Hospital Stay (HOSPITAL_COMMUNITY): Payer: Medicaid Other | Admitting: Occupational Therapy

## 2018-06-22 ENCOUNTER — Inpatient Hospital Stay (HOSPITAL_COMMUNITY): Payer: Medicaid Other

## 2018-06-22 ENCOUNTER — Inpatient Hospital Stay (HOSPITAL_COMMUNITY): Payer: Medicaid Other | Admitting: Physical Therapy

## 2018-06-22 NOTE — Progress Notes (Signed)
Physical Therapy Session Note  Patient Details  Name: Judy Boyd MRN: [id number] Date of Birth: [birth year]  Today's Date: 06/22/2018 PT Individual Time: 0902-1000 and 2229-7989 PT Individual Time Calculation (min): 58 min and 68 min  Short Term Goals: Week 1:  PT Short Term Goal 1 (Week 1): Pt will ambulate 50 ft with LRAD & min assist. PT Short Term Goal 2 (Week 1): Pt will negotiate 12 steps with 1 rail with min assist.  Skilled Therapeutic Interventions/Progress Updates:  Treatment 1: Pt received in w/c & agreeable to tx, denying c/o pain. Pt propelled w/c room>gym with L hemi technique and supervision. Gait x 100 ft + 100 ft with RW, R hand orthosis, and R GRAFO with shoe cover for last two attempts. Pt requires min assist overall with therapist providing manual facilitation for weight shifting L to allow increased ease of RLE foot clearance, and cuing for gait pattern as pt will attempt to abduct/circumduct RLE due to decreased hip/knee flexion for foot clearance during swing phase. Pt requires extra time to complete turns and retrograde gait to back up to w/c. Also focused on RUE management on orthosis with therapist providing min assist for standing balance while pt managed orthosis with cuing. Transitioned to RLE on 3" step, LUE support with cuing to utilize rail for balance only, with pt then stepping up with LLE with therapist providing manual facilitation at RLE to prevent buckling & increased knee extension with task focusing on RLE strengthening & NMR. Pt engaged in table top task of connect four while standing with intermittent LUE support with task focusing on standing balance with therapist providing min assist and pt reaching L then to R across body. Also focused on equal weight bearing BLE and R terminal knee extension for full quad activation during standing. Progressed to reaching outside of BOS to R to obtain then match cards on velcro board with min/mod assist for standing  balance. Pt ambulates additional 100 ft with min increasing to max assist 2/2 fatigue & LOB requiring assistance to correct. At end of session pt left in w/c in handoff to SLP.   Treatment 2: Pt received in w/c reporting she took herself to the bathroom by herself & therapist educated her on need for staff assistance for all transfers; RN made aware of pt's reports. Pt propels w/c room>gym with L hemi technique and supervision. Gait training x 100 ft + 100 ft with RW, R AFO, R hand orthosis and R shoe cover with min/mod assist for balance, manual facilitation for weight shifting L to increase R foot clearance during swing phase, cuing for increased swing through RLE and sequencing turning to sit in w/c; pt's RLE fatigues with increased distances & rest breaks taken PRN. Pt demonstrates improving ability to manage RUE on hand orthosis after sit>stand but demonstrates decreased attention to RUE with stand>sit. In BI gym, pt utilized dynavision while standing with min/mod assist with tactile cuing for R knee extension from increased quad activation with task focusing on dynamic balance and RLE strengthening & NMR. Pt completed transfer w/c<>nu-step with min assist via stand pivot. Pt utilized nu-step on level 1 x 2 minutes + 2 minutes + 2 minutes with rest breaks in between with BLE only and therapist providing assistance for neutral alignment RLE as extremity abducts. In gym, pt transferred w/c<>mat table with min assist stand pivot. Utilized yoga block for feedback for proper BLE placement and pt transferred sit<>stand from slightly elevated mat table with min assist  with task focusing on equal weight bearing BLE, RLE strengthening & NMR, with therapist providing multimodal cuing for upright posture and increased R quad activation for R terminal knee extension. Back in room, pt ambulates w/c>recliner with min assist with AD's as noted above. Reinforced need for pt to call for nursing staff when wanting to transfer  out of chair. Pt left in recliner with chair alarm donned & all needs in reach.  Therapy Documentation Precautions:  Precautions Precautions: Fall Precaution Comments: R hemi Restrictions Weight Bearing Restrictions: No Other Position/Activity Restrictions: R knee instability with weight bearing    Therapy/Group: Individual Therapy  Waunita Schooner 06/22/2018, 3:32 PM

## 2018-06-22 NOTE — Progress Notes (Signed)
[facility] PHYSICAL MEDICINE & REHABILITATION PROGRESS NOTE   Subjective/Complaints:  No issues overnite , discussed BP, pt states she was without an Rx when her MD moved  ROS- neg for CP, SOB, N/V/D Objective:   No results found. No results for input(s): WBC, HGB, HCT, PLT in the last 72 hours. No results for input(s): NA, K, CL, CO2, GLUCOSE, BUN, CREATININE, CALCIUM in the last 72 hours.  Intake/Output Summary (Last 24 hours) at 06/22/2018 0758 Last data filed at 06/21/2018 1800 Gross per 24 hour  Intake 840 ml  Output -  Net 840 ml     Physical Exam: Vital Signs Blood pressure (!) 128/98, pulse 80, temperature 98.6 F (37 C), temperature source Oral, resp. rate 18, height _0  (1.676 m), weight 58.2 kg, SpO2 99 %.  General: No acute distress Mood and affect are appropriate Heart: Regular rate and rhythm no rubs murmurs or extra sounds Lungs: Clear to auscultation, breathing unlabored, no rales or wheezes Abdomen: Positive bowel sounds, soft nontender to palpation, nondistended Extremities: No clubbing, cyanosis, or edema Skin: No evidence of breakdown, no evidence of rash Neurologic: Cranial nerves II through XII intact, motor strength is 5/5 in bilateral deltoid, bicep, tricep, grip, hip flexor, knee extensors, ankle dorsiflexor and plantar flexor Sensory exam normal sensation to light touch and proprioception in bilateral upper and lower extremities Cerebellar exam normal finger to nose to finger as well as heel to shin in bilateral upper and lower extremities Musculoskeletal: Full range of motion in all 4 extremities. No joint swelling    Assessment/Plan: 1. Functional deficits secondary to Left pontine and thalamic infarcts which require 3+ hours per day of interdisciplinary therapy in a comprehensive inpatient rehab setting.  Physiatrist is providing close team supervision and 24 hour management of active medical problems listed below.  Physiatrist and rehab  team continue to assess barriers to discharge/monitor patient progress toward functional and medical goals  Care Tool:  Bathing    Body parts bathed by patient: Right arm, Chest, Abdomen, Front perineal area, Buttocks, Right upper leg, Left upper leg, Right lower leg, Left lower leg, Face   Body parts bathed by helper: Left arm     Bathing assist Assist Level: Minimal Assistance - Patient > 75%     Upper Body Dressing/Undressing Upper body dressing   What is the patient wearing?: Bra, Pull over shirt    Upper body assist Assist Level: Minimal Assistance - Patient > 75%    Lower Body Dressing/Undressing Lower body dressing      What is the patient wearing?: Underwear/pull up, Pants     Lower body assist Assist for lower body dressing: Minimal Assistance - Patient > 75%     Toileting Toileting Toileting Activity did not occur (Clothing management and hygiene only): N/A (no void or bm)  Toileting assist Assist for toileting: Moderate Assistance - Patient 50 - 74%     Transfers Chair/bed transfer  Transfers assist     Chair/bed transfer assist level: Moderate Assistance - Patient 50 - 74%     Locomotion Ambulation   Ambulation assist      Assist level: Moderate Assistance - Patient 50 - 74% Assistive device: Walker-rolling(R AFO, R hand orthosis) Max distance: 40 ft   Walk 10 feet activity   Assist  Walk 10 feet activity did not occur: Safety/medical concerns  Assist level: Moderate Assistance - Patient - 50 - 74% Assistive device: Walker-rolling(R hand orthosis, R AFO)   Walk 50 feet activity  Assist Walk 50 feet with 2 turns activity did not occur: Safety/medical concerns  Assist level: Moderate Assistance - Patient - 50 - 74% Assistive device: Walker-hemi    Walk 150 feet activity   Assist Walk 150 feet activity did not occur: Safety/medical concerns  Assist level: Moderate Assistance - Patient - 50 - 74% Assistive device: Walker-hemi     Walk 10 feet on uneven surface  activity   Assist Walk 10 feet on uneven surfaces activity did not occur: Safety/medical concerns         Wheelchair     Assist Will patient use wheelchair at discharge?: (TBD) Type of Wheelchair: Manual    Wheelchair assist level: Supervision/Verbal cueing Max wheelchair distance: 100 ft    Wheelchair 50 feet with 2 turns activity    Assist    Wheelchair 50 feet with 2 turns activity did not occur: Safety/medical concerns   Assist Level: Supervision/Verbal cueing   Wheelchair 150 feet activity     Assist Wheelchair 150 feet activity did not occur: Safety/medical concerns           Medical Problem List and Plan: 1.   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 DVT Prophylaxis/Anticoagulation: Pharmaceutical: Lovenox 3. Pain Management: Tylenol 4. Mood: We will monitor, may need neuropsych consult, trazodone for sleep- mood flat 5. Neuropsych: This patient is capable of making decisions on her own behalf. 6. Skin/Wound Care: No areas identified we will continue to monitor 7. Fluids/Electrolytes/Nutrition: Recheck C met, monitor I's and O's 8.  COPD Dulera inhaler 2 puffs twice a day, Singulair 10 mg at bedtime 9.  History of hypertension resume verapamil if pressures remain elevated 1 to 2 weeks post stroke Vitals:   06/21/18 2025 06/22/18 0450  BP:  (!) 128/98  Pulse:  80  Resp:    Temp:  98.6 F (37 C)  SpO2: 99% 99%  within permissive range cont to monitor- on HCTZ and [place] at home will restart if BP remains elevated  10.  Mild leukocytosis , improving , afebrile cont to monitor 11. Mild anemia, no obvious sign of blood loss ,  stool guaic Neg LOS: 5 days A FACE TO FACE EVALUATION WAS PERFORMED  Charlett Blake 06/22/2018, 7:58 AM

## 2018-06-22 NOTE — Progress Notes (Signed)
Social Work Patient ID: Judy Boyd, female   DOB: June 03, 1961, 57 y.o.   MRN: [id number] Left letter for pt's daughter in pt's room she is aware and will make sure she gets it today when she comes to visit. She is doing well in therapies and making good progress.

## 2018-06-22 NOTE — Progress Notes (Signed)
Occupational Therapy Session Note  Patient Details  Name: Judy Boyd MRN: [id number] Date of Birth: Aug 13, 1961  Today's Date: 06/22/2018 OT Individual Time: 0802-0901 OT Individual Time Calculation (min): 59 min    Short Term Goals: Week 1:  OT Short Term Goal 1 (Week 1): pt will don bra wiht min A OT Short Term Goal 2 (Week 1): Pt will complete toilet tranfer wiht CGA OT Short Term Goal 3 (Week 1): Pt will completes bathing sit to stand wiht CGA OT Short Term Goal 4 (Week 1): Pt will don shoes wiht supervision  Skilled Therapeutic Interventions/Progress Updates:    Treatment session with focus on ADL retraining with functional transfers, dynamic standing balance, and RUE NMR. Pt received upright in bed declining shower but wanting to get dressed.  Completed stand pivot transfer with min assist with therapist stabilizing Rt knee.  Pt completed grooming tasks with setup to obtain items and apply toothpaste.  Dressing completed with assist only to pull bra down over back and to assist with donning Rt shoe with AFO.  Min guard for standing balance when pulling pants over hips.  Educated on use of shoe funnel, therapist assisted with placing shoe funnel in shoe and then pt able to don shoe with AFO without additional assistance.  Will need additional practice with placing shoe funnel in shoe.  Engaged in [place] in supine and sideling with pt able to demonstrate increased shoulder flexion, scapular protraction, and shoulder abduction with gravity eliminated.  Utilized therapy ball in sidelying to further facilitate scapular mobility and shoulder flexion.  Therapist providing tactile cues to facilitate full ROM and to minimize effects of gravity when in supine.  Reiterated use of Self-ROM and typical recovery pattern.  Pt returned to room and left upright in w/c with all needs in reach awaiting PT.  Therapy Documentation Precautions:  Precautions Precautions: Fall Precaution Comments: R  hemi Restrictions Weight Bearing Restrictions: No Other Position/Activity Restrictions: R knee instability with weight bearing Pain: Pain Assessment Pain Scale: 0-10 Pain Score: 0-No pain   Therapy/Group: Individual Therapy  Simonne Come 06/22/2018, 10:29 AM

## 2018-06-22 NOTE — Plan of Care (Signed)
  Problem: Consults Goal: RH STROKE PATIENT EDUCATION Description See Patient Education module for education specifics  Outcome: Progressing   Problem: RH BOWEL ELIMINATION Goal: RH STG MANAGE BOWEL W/MEDICATION W/ASSISTANCE Description STG Manage Bowel with Medication with min.Assistance.  Outcome: Progressing Flowsheets (Taken 06/22/2018 1127) STG: Pt will manage bowels with medication with assistance: 6-Modified independent   Problem: RH BLADDER ELIMINATION Goal: RH STG MANAGE BLADDER WITH ASSISTANCE Description STG Manage Bladder With min. Assistance  Outcome: Progressing Flowsheets (Taken 06/22/2018 1127) STG: Pt will manage bladder with assistance: 6-Modified independent   Problem: RH SKIN INTEGRITY Goal: RH STG SKIN FREE OF INFECTION/BREAKDOWN Description With min. assisst  Outcome: Progressing   Problem: RH SAFETY Goal: RH STG ADHERE TO SAFETY PRECAUTIONS W/ASSISTANCE/DEVICE Description STG Adhere to Safety Precautions With min. Assistance/Device.  Outcome: Progressing Flowsheets (Taken 06/22/2018 1127) STG:Pt will adhere to safety precautions with assistance/device: 4-Minimal assistance Goal: RH STG DECREASED RISK OF FALL WITH ASSISTANCE Description STG Decreased Risk of Fall With Mod.Assistance.  Outcome: Progressing Flowsheets (Taken 06/22/2018 1127) [id number] risk of fall  with assistance/device: 4-Minimal assistance   Problem: RH PAIN MANAGEMENT Goal: RH STG PAIN MANAGED AT OR BELOW PT'S PAIN GOAL Description Less than 3.  Outcome: Progressing   Problem: RH KNOWLEDGE DEFICIT Goal: RH STG INCREASE KNOWLEDGE OF STROKE PROPHYLAXIS Description With min. assist.  Outcome: Progressing

## 2018-06-22 NOTE — Progress Notes (Signed)
Speech Language Pathology Daily Session Note  Patient Details  Name: Judy Boyd MRN: [id number] Date of Birth: 11/03/1960  Today's Date: 06/22/2018 SLP Individual Time: 1000-1100 SLP Individual Time Calculation (min): 60 min  Short Term Goals: Week 1: SLP Short Term Goal 1 (Week 1): Pt will utilize speech intelligibility startegies to increase intelligibility at the phrase level to ~ 75% with Mod A cues.  SLP Short Term Goal 2 (Week 1): Pt will self-monitor and self-correct unintelligible speech at the phrase level in 7 out of 10 opportunities with Mod A cues.  SLP Short Term Goal 3 (Week 1): Pt will demonstrate communicate information with supervision cues for word finding.  SLP Short Term Goal 4 (Week 1): Pt will demonstrate increased anticipatory awareness by identifying safe vs unsafe activites to perform within room with Mod A cues.   Skilled Therapeutic Interventions:Skilled ST services focused on speech skills. SLP facilitated speech intelligibility strategies in structured tasks at phrase level with 70% intelligibility, min A verbal cues  and in unstructured task 70% intelligibility with mod A verbal cues. SLP facilitated word finding skills with limited context communication barrier task pt required min A verbal cues verse increase context in communication barrier task, pt required min-supervision A cues. Pt required min-mod A verbal cues for error awareness and correction of verbal errors at phrase level. Pt was left in room with call bell within reach and chair alarm set. SLP reccomends to continue skilled services.     Pain Pain Assessment Pain Score: 0-No pain  Therapy/Group: Individual Therapy  Trevar Boehringer  [facility] 06/22/2018, 12:51 PM

## 2018-06-23 ENCOUNTER — Inpatient Hospital Stay (HOSPITAL_COMMUNITY): Payer: Medicaid Other | Admitting: Physical Therapy

## 2018-06-23 ENCOUNTER — Inpatient Hospital Stay (HOSPITAL_COMMUNITY): Payer: Medicaid Other

## 2018-06-23 ENCOUNTER — Inpatient Hospital Stay (HOSPITAL_COMMUNITY): Payer: Medicaid Other | Admitting: *Deleted

## 2018-06-23 ENCOUNTER — Inpatient Hospital Stay (HOSPITAL_COMMUNITY): Payer: Medicaid Other | Admitting: Occupational Therapy

## 2018-06-23 LAB — CBC
HCT: 37.6 % (ref 36.0–46.0)
Hemoglobin: 11 g/dL — ABNORMAL LOW (ref 12.0–15.0)
MCH: 27 pg (ref 26.0–34.0)
MCHC: 29.3 g/dL — ABNORMAL LOW (ref 30.0–36.0)
MCV: 92.2 fL (ref 80.0–100.0)
PLATELETS: 383 10*3/uL (ref 150–400)
RBC: 4.08 MIL/uL (ref 3.87–5.11)
RDW: 11.9 % (ref 11.5–15.5)
WBC: 11.5 10*3/uL — ABNORMAL HIGH (ref 4.0–10.5)
nRBC: 0 % (ref 0.0–0.2)

## 2018-06-23 LAB — BASIC METABOLIC PANEL
Anion gap: 2 — ABNORMAL LOW (ref 5–15)
BUN: 25 mg/dL — ABNORMAL HIGH (ref 6–20)
CALCIUM: 9.5 mg/dL (ref 8.9–10.3)
CO2: 28 mmol/L (ref 22–32)
CREATININE: 0.66 mg/dL (ref 0.44–1.00)
Chloride: 110 mmol/L (ref 98–111)
GFR calc Af Amer: >60 mL/min (ref >60)
GFR calc non Af Amer: >60 mL/min (ref >60)
GLUCOSE: 87 mg/dL (ref 70–99)
Potassium: 4.2 mmol/L (ref 3.5–5.1)
Sodium: 140 mmol/L (ref 135–145)

## 2018-06-23 NOTE — Progress Notes (Signed)
Orthopedic Tech Progress Note Patient Details:  Judy Boyd 25-Feb-1961 [id number]  Patient ID: Judy Boyd, female   DOB: November 17, 1960, 57 y.o.   MRN: [id number]   Hildred Priest 06/23/2018, 11:02 AM Called in [employer]anger brace order; spoke with [facility]

## 2018-06-23 NOTE — Progress Notes (Signed)
Occupational Therapy Session Note  Patient Details  Name: Judy Boyd MRN: [id number] Date of Birth: [birth year]  Today's Date: 06/23/2018 OT Individual Time: 0800-0900 OT Individual Time Calculation (min): 60 min    Short Term Goals: Week 1:  OT Short Term Goal 1 (Week 1): pt will don bra wiht min A OT Short Term Goal 2 (Week 1): Pt will complete toilet tranfer wiht CGA OT Short Term Goal 3 (Week 1): Pt will completes bathing sit to stand wiht CGA OT Short Term Goal 4 (Week 1): Pt will don shoes wiht supervision  Skilled Therapeutic Interventions/Progress Updates:    Treatment session with focus on ADL retraining with functional transfers, dynamic standing balance, and RUE NMR.  Pt received seated upright in w/c expressing desire to shower this session.  Completed stand pivot transfer w/c > tub bench in room shower prior to therapist locking w/c brakes, requiring min-mod assist and cues due to decreased emergent and anticipatory awareness. Bathing completed at sit > stand level with contact guard when standing to wash buttocks.  Dressing completed at sit > stand level with contact guard assist for standing balance, pt demonstrating increased stability and awareness of RLE when standing to pull underwear and pants over hips.  Utilized shoe funnel to don shoes, pt requiring assistance this session due to decreased frustration tolerance.  Engaged in [place] in sitting with use of UE Ranger to focus on shoulder flexion/extension and elbow flexion/extension.  Therapist provided min/tactile cues at wrist and elbow to continue to provide facilitation and to minimize effects of gravity.  Pt able to open all digits this session to grasp top of small cup with assist to stabilize at wrist and transport cup across midline while maintaining grasp.  Pt unable to release grasp on cup.  Therapist educated on elbow, forearm, and wrist self-ROM exercises on handout and encouraged pt complete these, in addition  to shoulder, during down time.  Pt left upright in w/c with chair alarm on and all needs in reach.  Therapy Documentation Precautions:  Precautions Precautions: Fall Precaution Comments: R hemi Restrictions Weight Bearing Restrictions: No Other Position/Activity Restrictions: R knee instability with weight bearing General:   Vital Signs: Oxygen Therapy SpO2: 99 % O2 Device: Room Air Pain:  Pt with no c/o pain   Therapy/Group: Individual Therapy  Simonne Come 06/23/2018, 9:17 AM

## 2018-06-23 NOTE — Progress Notes (Signed)
Recreational Therapy Assessment and Plan  Patient Details  Name: Judy Boyd MRN: [id number] Date of Birth: 09/04/1960 Today's Date: 06/23/2018  Rehab Potential:  Good ELOS:   2 weeks  Assessment Problem List:      Patient Active Problem List   Diagnosis Date Noted  . Hemiparesis affecting right side as late effect of stroke ([place]) 06/17/2018  . Stroke (cerebrum) ([place]) 06/17/2018  . H/O medication noncompliance   . Chronic pain syndrome   . Leukocytosis   . Acute ischemic stroke ([place]) 06/13/2018  . Tobacco abuse 06/13/2018  . Lacunar stroke ([place]) 06/12/2018  . Hypertension   . COPD (chronic obstructive pulmonary disease) ([place])   . GERD (gastroesophageal reflux disease) 04/06/2012  . Abdominal pain 04/06/2012  . Nausea & vomiting 04/06/2012  . Melena 04/06/2012    Past Medical History:  Past Medical History:  Diagnosis Date  . Anxiety   . Arthritis   . Asthma   . Bowel obstruction ([place])   . Chronic pain   . COPD (chronic obstructive pulmonary disease) ([place])   . Depression   . GERD (gastroesophageal reflux disease)   . Hypertension    Past Surgical History:       Past Surgical History:  Procedure Laterality Date  . ABDOMINAL SURGERY  2003   blockage  . left salpingectomy    . uterine ablation      Assessment & Plan Clinical Impression: 57 year old female with a history of hypertension, COPD, depression, and chronic pain presenting with 2-day history of facial droop and dysarthria that began on 06/10/2018.  The patient complained of right upper extremity and right lower extremity weakness that started around the same time. On 10/29 her right hemiparesis was noted to have worsened, which was felt most likely to be due to extension of her stroke. Pt was moved from [facility] to [facility] on 06/15/2018. CT scan on 06/15/2018 stated Left pontine low density is noted consistent with acute infarction described on prior MRI.  Pt presents  with decreased activity tolerance, decreased functional mobility, decreased balance, decreased coordination, decreased attention, decreased awareness, decreased problem solving, decreased safety awareness, decreased memory Limiting pt's independence with leisure/community pursuits.   Plan Min 1 TR session >20 minutes during LOS  Recommendations for other services: None   Discharge Criteria: Patient will be discharged from TR if patient refuses treatment 3 consecutive times without medical reason.  If treatment goals not met, if there is a change in medical status, if patient makes no progress towards goals or if patient is discharged from hospital.  The above assessment, treatment plan, treatment alternatives and goals were discussed and mutually agreed upon: by patient  [place] 06/23/2018, 10:53 AM

## 2018-06-23 NOTE — Patient Care Conference (Signed)
Inpatient RehabilitationTeam Conference and Plan of Care Update Date: 06/23/2018   Time: 10:45 AM    Patient Name: Judy Boyd      Medical Record Number: [id number]  Date of Birth: [birth year] Sex: Female         Room/Bed: 4W20C/4W20C-01 Payor Info: Payor: MEDICAID [place] / Plan: MEDICAID [place] ACCESS / Product Type: *No Product type* /    Admitting Diagnosis: Multiple L infracts  Admit Date/Time:  06/17/2018  4:56 PM Admission Comments: No comment available   Primary Diagnosis:  <principal problem not specified> Principal Problem: <principal problem not specified>  Patient Active Problem List   Diagnosis Date Noted  . Hemiparesis affecting right side as late effect of stroke ([place]) 06/17/2018  . Stroke (cerebrum) ([place]) 06/17/2018  . H/O medication noncompliance   . Chronic pain syndrome   . Leukocytosis   . Acute ischemic stroke ([place]) 06/13/2018  . Tobacco abuse 06/13/2018  . Lacunar stroke ([place]) 06/12/2018  . Hypertension   . COPD (chronic obstructive pulmonary disease) ([place])   . GERD (gastroesophageal reflux disease) 04/06/2012  . Abdominal pain 04/06/2012  . Nausea & vomiting 04/06/2012  . Melena 04/06/2012    Expected Discharge Date: Expected Discharge Date: 07/02/18  Team Members Present: Physician leading conference: Dr. Alysia Penna Social Worker Present: Ovidio Kin, LCSW Nurse Present: Dorien Chihuahua, RN PT Present: Lavone Nian, PT OT Present: Simonne Come, OT SLP Present: Charolett Bumpers, SLP PPS Coordinator present : Daiva Nakayama, RN, CRRN     Current Status/Progress Goal Weekly Team Focus  Medical   Blood pressures overall improving, intake is good, still with severe hemiparesis on the right side  Maintain medical stability reduce fall risk, secondary stroke prevention  Starting to regulate blood pressure more strictly this week   Bowel/Bladder   incontinent of bladder at times continent of bowel LBM 06-22-18  Continent of bowel and bladder  maintain regular bowel pattern  Assist with toileting needs    Swallow/Nutrition/ Hydration             ADL's   min assist stand pivot transfers, min assist bathing and dressing, requires blocking at Rt knee due to buckling.  Focus on RUE NMR with pt developing shoulder and elbow movements  Supervision  ADL retraining, dynamic standing balance, ambulatory transfers, RUE NMR   Mobility   mod<>max assist short distance gait with RW, R hand orthosis & R AFO with shoe cover, mod assist transfers supervision w/c mobility  supervision overall  R NMR, balance, transfers, gait, RLE strengthening, pt education   Communication   Mod-Min A phrase level intelligibility , Supervision A word finding   Supervision - Mod I  intelligibility strategies, error correction and word finding    Safety/Cognition/ Behavioral Observations  Mod-Min A  Supervison A  safety awareness, anticipatory awareness   Pain   no complaints of pain  no pain   Assess pain q shfit and prn   Skin   no skin issues   no new skin issues   Assess skin q shift and prn      *See Care Plan and progress notes for long and short-term goals.     Barriers to Discharge  Current Status/Progress Possible Resolutions Date Resolved   Physician    Medical stability     Progressing towards goals  Continue rehab      Nursing                  PT  Decreased caregiver support;Lack  of/limited family support  pt lives alone but states her daughter can come stay with her              OT                  SLP                SW                Discharge Planning/Teaching Needs:  HOme and oldest daughter to assist with her care. She is making good progress in her therapies.       Team Discussion:  Making good progress in therapies-still requiring min-mod level. R-knee buckles, order for AFO in for evaluations. Awareness and safety issues working on in therapies. Speech 70 % at phrase level. BP being more managed and more stable-MD still  adjusting meds. Needs neuro-psych referral for coping  Revisions to Treatment Plan:  DC 11/15    Continued Need for Acute Rehabilitation Level of Care: The patient requires daily medical management by a physician with specialized training in physical medicine and rehabilitation for the following conditions: Daily direction of a multidisciplinary physical rehabilitation program to ensure safe treatment while eliciting the highest outcome that is of practical value to the patient.: Yes Daily medical management of patient stability for increased activity during participation in an intensive rehabilitation regime.: Yes Daily analysis of laboratory values and/or radiology reports with any subsequent need for medication adjustment of medical intervention for : Blood pressure problems;Neurological problems   I attest that I was present, lead the team conference, and concur with the assessment and plan of the team.   Elease Hashimoto 06/23/2018, 1:18 PM

## 2018-06-23 NOTE — Progress Notes (Signed)
Speech Language Pathology Daily Session Note  Patient Details  Name: Judy Boyd MRN: [id number] Date of Birth: [birth year]  Today's Date: 06/23/2018 SLP Individual Time: 1100-1200 SLP Individual Time Calculation (min): 60 min  Short Term Goals: Week 1: SLP Short Term Goal 1 (Week 1): Pt will utilize speech intelligibility startegies to increase intelligibility at the phrase level to ~ 75% with Mod A cues.  SLP Short Term Goal 2 (Week 1): Pt will self-monitor and self-correct unintelligible speech at the phrase level in 7 out of 10 opportunities with Mod A cues.  SLP Short Term Goal 3 (Week 1): Pt will demonstrate communicate information with supervision cues for word finding.  SLP Short Term Goal 4 (Week 1): Pt will demonstrate increased anticipatory awareness by identifying safe vs unsafe activites to perform within room with Mod A cues.   Skilled Therapeutic Interventions:Skilled ST services focused on cognitive skills. SLP facilitated anticipatory awareness skills, given verbal questions pertaining to safety in room, pt required min A fade to supervision question cues. Pt agreed that transferring self to restroom was unsafe and agreed to follow safety plan during length of stay. SLP assisted pt to the restroom, pt required min-supervision A verbal cues for safety awareness due to impulsivity. SLP facilitated speech intelligibility strategies informal conversation, pt demonstrated 60% intelligibility with mod A verbal cues, when emotional (about safety restrictions and desire to go home.) SLP facilitated speech intelligibility at phrase level in structured task, pt demonstrated 75% intelligibility with mod A verbal cues. Pt was left in room with call bell within reach and chair alarm set. SLP reccomends to continue skilled services.     Pain Pain Assessment Pain Score: 0-No pain  Therapy/Group: Individual Therapy  Hatice Bubel  [facility] 06/23/2018, 4:43 PM

## 2018-06-23 NOTE — Progress Notes (Signed)
Physical Therapy Session Note  Patient Details  Name: Judy Boyd MRN: [id number] Date of Birth: 14-Oct-1960  Today's Date: 06/23/2018 PT Individual Time: 1300-1415 PT Individual Time Calculation (min): 75 min   Short Term Goals: Week 1:  PT Short Term Goal 1 (Week 1): Pt will ambulate 50 ft with LRAD & min assist. PT Short Term Goal 2 (Week 1): Pt will negotiate 12 steps with 1 rail with min assist.     Skilled Therapeutic Interventions/Progress Updates:   Toilet transfer for continent voiding.  Pt stated that she had had some problems with incontinence due to urgency.  PT advised her to request help for toileting throughout the day and not to wait until her bladder is really full.  Gait training on level tile x 40' x 2 including turns; cues for R knee extension, wider BOS, pushing RW  Forward sufficiently.  Brooke Pace, [facility] arrived and evaluated pt with RAFO ambulating iwht AFOs with pre-tibial component to cotnrol R knee.  He took shoe to add shoe cap and will deliver [employer] brand AFO by tomorrow.  Squat pivot transfers with min assist, cues for head/hips relationship.  neuromuscular re-education RLE via forced use, multimodal cues and demo,  for sit> stand biased to R, standing biased to R, isometric contraction R knee extensors x 10 second, sit> squat biased to R, step-ups with LLE onto 4" high step to load RLE.  Gait training up/down 4 steps L rail, mod assist.    Pt left resting in w/c with seat alarm set and needs at hand.  Therapy Documentation Precautions:  Precautions Precautions: Fall Precaution Comments: R hemi Restrictions Weight Bearing Restrictions: No Other Position/Activity Restrictions: R knee instability with weight bearing     Pain: Pain Assessment Pain Score: 0-No pain    Therapy/Group: Individual Therapy  Viraj Liby 06/23/2018, 2:26 PM

## 2018-06-23 NOTE — Progress Notes (Signed)
[facility] PHYSICAL MEDICINE & REHABILITATION PROGRESS NOTE   Subjective/Complaints:  No issues overnite, working with OT, has gravity eliminated R shoulder flex and abd and elbow flex  ROS- neg for CP, SOB, N/V/D Objective:   No results found. Recent Labs    06/23/18 0412  WBC 11.5*  HGB 11.0*  HCT 37.6  PLT 383   Recent Labs    06/23/18 0412  NA 140  K 4.2  CL 110  CO2 28  GLUCOSE 87  BUN 25*  CREATININE 0.66  CALCIUM 9.5    Intake/Output Summary (Last 24 hours) at 06/23/2018 0826 Last data filed at 06/22/2018 1825 Gross per 24 hour  Intake 720 ml  Output -  Net 720 ml     Physical Exam: Vital Signs Blood pressure 135/86, pulse 69, temperature 98 F (36.7 C), temperature source Oral, resp. rate 18, height '5\' 6"'$  (1.676 m), weight 58.3 kg, SpO2 100 %.  General: No acute distress Mood and affect are appropriate Heart: Regular rate and rhythm no rubs murmurs or extra sounds Lungs: Clear to auscultation, breathing unlabored, no rales or wheezes Abdomen: Positive bowel sounds, soft nontender to palpation, nondistended Extremities: No clubbing, cyanosis, or edema Skin: No evidence of breakdown, no evidence of rash Neurologic: Cranial nerves II through XII intact, motor strength is 5/5 in LEFT   deltoid, bicep, tricep, grip, hip flexor, knee extensors, ankle dorsiflexor and plantar flexor Trace Right 4th and 5th digit flex, 2- thumb flex Sensory exam normal sensation to light touch and proprioception in bilateral upper and lower extremities Cerebellar exam normal finger to nose to finger as well as heel to shin in bilateral upper and lower extremities Musculoskeletal: Full range of motion in all 4 extremities. No joint swelling    Assessment/Plan: 1. Functional deficits secondary to Left pontine and thalamic infarcts which require 3+ hours per day of interdisciplinary therapy in a comprehensive inpatient rehab setting.  Physiatrist is providing close team  supervision and 24 hour management of active medical problems listed below.  Physiatrist and rehab team continue to assess barriers to discharge/monitor patient progress toward functional and medical goals  Care Tool:  Bathing    Body parts bathed by patient: Right arm, Chest, Abdomen, Front perineal area, Buttocks, Right upper leg, Left upper leg, Right lower leg, Left lower leg, Face, Left arm   Body parts bathed by helper: Left arm     Bathing assist Assist Level: Contact Guard/Touching assist     Upper Body Dressing/Undressing Upper body dressing   What is the patient wearing?: Bra, Pull over shirt    Upper body assist Assist Level: Minimal Assistance - Patient > 75%    Lower Body Dressing/Undressing Lower body dressing      What is the patient wearing?: Underwear/pull up, Pants     Lower body assist Assist for lower body dressing: Contact Guard/Touching assist     Toileting Toileting Toileting Activity did not occur (Clothing management and hygiene only): N/A (no void or bm)  Toileting assist Assist for toileting: Moderate Assistance - Patient 50 - 74%     Transfers Chair/bed transfer  Transfers assist     Chair/bed transfer assist level: Minimal Assistance - Patient > 75%     Locomotion Ambulation   Ambulation assist      Assist level: Moderate Assistance - Patient 50 - 74% Assistive device: Walker-rolling(R hand orthosis, R AFO, R shoe cover) Max distance: 100 ft    Walk 10 feet activity   Assist  Walk 10 feet activity did not occur: Safety/medical concerns  Assist level: Moderate Assistance - Patient - 50 - 74% Assistive device: Walker-rolling(R hand orthosis, R AFO, R shoe cover)   Walk 50 feet activity   Assist Walk 50 feet with 2 turns activity did not occur: Safety/medical concerns  Assist level: Moderate Assistance - Patient - 50 - 74% Assistive device: Walker-rolling(R hand orthosis, R AFO, R shoe cover)    Walk 150 feet  activity   Assist Walk 150 feet activity did not occur: Safety/medical concerns  Assist level: Moderate Assistance - Patient - 50 - 74% Assistive device: Walker-hemi    Walk 10 feet on uneven surface  activity   Assist Walk 10 feet on uneven surfaces activity did not occur: Safety/medical concerns         Wheelchair     Assist Will patient use wheelchair at discharge?: (TBD) Type of Wheelchair: Manual    Wheelchair assist level: Supervision/Verbal cueing Max wheelchair distance: 100 ft    Wheelchair 50 feet with 2 turns activity    Assist    Wheelchair 50 feet with 2 turns activity did not occur: Safety/medical concerns   Assist Level: Supervision/Verbal cueing   Wheelchair 150 feet activity     Assist Wheelchair 150 feet activity did not occur: Safety/medical concerns           Medical Problem List and Plan: 1.   Right hemiparesis, dysarthria secondary to left pontine and left thalamic infarcts, around 06/10/2018 aspirin and Plavix CIR PT, OT- Team conference today please see physician documentation under team conference tab, met with team face-to-face to discuss problems,progress, and goals. Formulized individual treatment plan based on medical history, underlying problem and comorbidities.  2. DVT Prophylaxis/Anticoagulation: Pharmaceutical: Lovenox 3. Pain Management: Tylenol 4. Mood: We will monitor, may need neuropsych consult, trazodone for sleep- mood flat 5. Neuropsych: This patient is capable of making decisions on her own behalf. 6. Skin/Wound Care: No areas identified we will continue to monitor 7. Fluids/Electrolytes/Nutrition: Recheck C met, monitor I's and O's 8.  COPD Dulera inhaler 2 puffs twice a day, Singulair 10 mg at bedtime No wheezing9.  History of hypertension resume verapamil if pressures remain elevated 1 to 2 weeks post stroke Vitals:   06/22/18 1929 06/23/18 0328  BP: (!) 156/90 135/86  Pulse: 85 69  Resp: 18 18   Temp: 98.3 F (36.8 C) 98 F (36.7 C)  SpO2: 99% 100%  within permissive range cont to monitor- on HCTZ and [place] at home will restart if BP remains elevated- improved 11/6  10.  Mild leukocytosis , odondogenic infxn on Augmentin ?duration, ask DDS for recs , afebrile cont to monitor 11. Mild anemia, no obvious sign of blood loss ,  stool guaic Neg LOS: 6 days A FACE TO FACE EVALUATION WAS PERFORMED  Charlett Blake 06/23/2018, 8:26 AM

## 2018-06-23 NOTE — Progress Notes (Signed)
Social Work Patient ID: Judy Boyd Comment, female   DOB: [birth year], 57 y.o.   MRN: [id number] Met with pt and spoke with daughter via telephone to inform team conference goals supervision level and target discharge date 11/15. Daughter may come in next week to go through therapies with pt. She feels pt will try to do more at home to be independent since this is her nature. Pt is aware and needs to be safe and she plans too. Work toward discharge needs.

## 2018-06-24 ENCOUNTER — Ambulatory Visit (HOSPITAL_COMMUNITY): Payer: Medicaid Other

## 2018-06-24 ENCOUNTER — Inpatient Hospital Stay (HOSPITAL_COMMUNITY): Payer: Medicaid Other | Admitting: Occupational Therapy

## 2018-06-24 ENCOUNTER — Inpatient Hospital Stay (HOSPITAL_COMMUNITY): Payer: Medicaid Other | Admitting: Speech Pathology

## 2018-06-24 ENCOUNTER — Encounter (HOSPITAL_COMMUNITY): Payer: Self-pay | Admitting: Dentistry

## 2018-06-24 NOTE — Consult Note (Signed)
DENTAL CONSULTATION  Date of Consultation:  06/24/2018 Patient Name:   Judy Boyd Date of Birth:   July 08, 1961 Medical Record Number: [id number]  VITALS: BP 133/85 (BP Location: Left Arm)   Pulse 78   Temp 97.8 F (36.6 C) (Oral)   Resp 18   Ht 5\' 6"  (1.676 m)   Wt 58.3 kg   SpO2 97%   BMI 20.74 kg/m   CHIEF COMPLAINT: Patient referred by Dr. Letta Pate for a dental consultation.  HPI: Judy Boyd is a 57 year old female recently diagnosed with an acute stroke.  Patient currently on the rehab unit undergoing rehabilitation services.  A dental consultation was requested to evaluate patient for possible dental infection that was noted on the CT scan.  Patient was placed on oral antibiotic therapy at that time.  The patient currently denies acute toothaches, swellings, or abscesses.  Patient was last seen by her primary dentist approximately 1 year ago in [place], [place].  Patient can not remember the name of the dentist.  Patient has a maxillary acrylic partial denture that" fits okay".  Patient denies having dental phobia.  PROBLEM LIST: Patient Active Problem List   Diagnosis Date Noted  . Hemiparesis affecting right side as late effect of stroke ([place]) 06/17/2018  . Stroke (cerebrum) ([employer]) 06/17/2018  . H/O medication noncompliance   . Chronic pain syndrome   . Leukocytosis   . Acute ischemic stroke ([place]) 06/13/2018  . Tobacco abuse 06/13/2018  . Lacunar stroke ([place]) 06/12/2018  . Hypertension   . COPD (chronic obstructive pulmonary disease) ([place])   . GERD (gastroesophageal reflux disease) 04/06/2012  . Abdominal pain 04/06/2012  . Nausea & vomiting 04/06/2012  . Melena 04/06/2012    PMH: Past Medical History:  Diagnosis Date  . Anxiety   . Arthritis   . Asthma   . Bowel obstruction ([place])   . Chronic pain   . COPD (chronic obstructive pulmonary disease) ([place])   . Depression   . GERD (gastroesophageal reflux disease)   . Hypertension      PSH: Past Surgical History:  Procedure Laterality Date  . ABDOMINAL SURGERY  2003   blockage  . left salpingectomy    . uterine ablation      ALLERGIES: Allergies  Allergen Reactions  . Amlodipine Other (See Comments)    Dehydration     MEDICATIONS: Current Facility-Administered Medications  Medication Dose Route Frequency Provider Last Rate Last Dose  . acetaminophen (TYLENOL) tablet 325-650 mg  325-650 mg Oral Q4H PRN Love, Pamela S, PA-C      . albuterol (PROVENTIL) (2.5 MG/3ML) 0.083% nebulizer solution 3 mL  3 mL Inhalation Q6H PRN Love, Pamela S, PA-C      . alum & mag hydroxide-simeth (MAALOX/MYLANTA) 200-200-20 MG/5ML suspension 30 mL  30 mL Oral Q4H PRN Love, Pamela S, PA-C      . amoxicillin-clavulanate (AUGMENTIN) 875-125 MG per tablet 1 tablet  1 tablet Oral Q12H Love, Ivan Anchors, PA-C   1 tablet at 06/24/18 0818  . aspirin EC tablet 81 mg  81 mg Oral Daily Bary Leriche, PA-C   81 mg at 06/24/18 0818  . atorvastatin (LIPITOR) tablet 40 mg  40 mg Oral q1800 Bary Leriche, PA-C   40 mg at 06/23/18 1706  . bisacodyl (DULCOLAX) suppository 10 mg  10 mg Rectal Daily PRN Love, Pamela S, PA-C      . clopidogrel (PLAVIX) tablet 75 mg  75 mg Oral Daily Love, Ivan Anchors, PA-C  75 mg at 06/24/18 0818  . diphenhydrAMINE (BENADRYL) 12.5 MG/5ML elixir 12.5-25 mg  12.5-25 mg Oral Q6H PRN Love, Pamela S, PA-C      . enoxaparin (LOVENOX) injection 40 mg  40 mg Subcutaneous Q24H Love, Pamela S, PA-C   40 mg at 06/23/18 2139  . feeding supplement (PRO-STAT SUGAR FREE 64) liquid 30 mL  30 mL Oral BID Bary Leriche, PA-C   30 mL at 06/23/18 0752  . guaiFENesin-dextromethorphan (ROBITUSSIN DM) 100-10 MG/5ML syrup 5-10 mL  5-10 mL Oral Q6H PRN Love, Pamela S, PA-C      . mometasone-formoterol (DULERA) 200-5 MCG/ACT inhaler 2 puff  2 puff Inhalation BID Love, Pamela S, PA-C   2 puff at 06/24/18 0800  . montelukast (SINGULAIR) tablet 10 mg  10 mg Oral QHS Bary Leriche, PA-C   10 mg at  06/23/18 2139  . nicotine (NICODERM CQ - dosed in mg/24 hours) patch 14 mg  14 mg Transdermal Daily Kirsteins, Luanna Salk, MD   14 mg at 06/24/18 0820  . polyethylene glycol (MIRALAX / GLYCOLAX) packet 17 g  17 g Oral Daily PRN Love, Pamela S, PA-C      . polyethylene glycol (MIRALAX / GLYCOLAX) packet 17 g  17 g Oral Daily Bary Leriche, PA-C   17 g at 06/23/18 0752  . prochlorperazine (COMPAZINE) tablet 5-10 mg  5-10 mg Oral Q6H PRN Love, Pamela S, PA-C       Or  . prochlorperazine (COMPAZINE) injection 5-10 mg  5-10 mg Intramuscular Q6H PRN Love, Pamela S, PA-C       Or  . prochlorperazine (COMPAZINE) suppository 12.5 mg  12.5 mg Rectal Q6H PRN Love, Pamela S, PA-C      . sodium phosphate (FLEET) 7-19 GM/118ML enema 1 enema  1 enema Rectal Once PRN Love, Pamela S, PA-C      . traZODone (DESYREL) tablet 25-50 mg  25-50 mg Oral QHS PRN Bary Leriche, PA-C   25 mg at 06/23/18 2139    LABS: Lab Results  Component Value Date   WBC 11.5 (H) 06/23/2018   HGB 11.0 (L) 06/23/2018   HCT 37.6 06/23/2018   MCV 92.2 06/23/2018   PLT 383 06/23/2018      Component Value Date/Time   NA 140 06/23/2018 0412   K 4.2 06/23/2018 0412   CL 110 06/23/2018 0412   CO2 28 06/23/2018 0412   GLUCOSE 87 06/23/2018 0412   BUN 25 (H) 06/23/2018 0412   CREATININE 0.66 06/23/2018 0412   CREATININE 0.83 04/06/2012 1430   CALCIUM 9.5 06/23/2018 0412   GFRNONAA >60 06/23/2018 0412   GFRAA >60 06/23/2018 0412   Lab Results  Component Value Date   INR 0.92 06/12/2018   No results found for: PTT  SOCIAL HISTORY: Social History   Socioeconomic History  . Marital status: Single    Spouse name: Not on file  . Number of children: 2  . Years of education: Not on file  . Highest education level: Not on file  Occupational History  . Occupation: unemployed  Social Needs  . Financial resource strain: Not on file  . Food insecurity:    Worry: Not on file    Inability: Not on file  . Transportation needs:     Medical: Not on file    Non-medical: Not on file  Tobacco Use  . Smoking status: Current Every Day Smoker    Packs/day: 1.00    Years: 10.00  Pack years: 10.00    Types: Cigarettes  . Smokeless tobacco: Never Used  Substance and Sexual Activity  . Alcohol use: Yes    Comment: 2 beers per week  . Drug use: No  . Sexual activity: Yes    Birth control/protection: None  Lifestyle  . Physical activity:    Days per week: Not on file    Minutes per session: Not on file  . Stress: Not on file  Relationships  . Social connections:    Talks on phone: Not on file    Gets together: Not on file    Attends religious service: Not on file    Active member of club or organization: Not on file    Attends meetings of clubs or organizations: Not on file    Relationship status: Not on file  . Intimate partner violence:    Fear of current or ex partner: Not on file    Emotionally abused: Not on file    Physically abused: Not on file    Forced sexual activity: Not on file  Other Topics Concern  . Not on file  Social History Narrative  . Not on file    FAMILY HISTORY: Family History  Problem Relation Age of Onset  . Heart disease Mother   . Heart disease Father     REVIEW OF SYSTEMS: Reviewed with the patient as per History of present illness. Psych: Patient denies having dental phobia.  DENTAL HISTORY: CHIEF COMPLAINT: Patient referred by Dr. Letta Pate for a dental consultation.  HPI: JULIONA VALES is a 57 year old female recently diagnosed with an acute stroke.  Patient currently on the rehab unit undergoing rehabilitation services.  A dental consultation was requested to evaluate patient for possible dental infection that was noted on the CT scan.  Patient was placed on oral antibiotic therapy at that time.  The patient currently denies acute toothaches, swellings, or abscesses.  Patient was last seen by her primary dentist approximately 1 year ago in [place], [place].  Patient can not remember the name of the dentist.  Patient has a maxillary acrylic partial denture that" fits okay".  Patient denies having dental phobia.  DENTAL EXAMINATION: GENERAL: The patient is a well-developed, well-nourished female no acute distress.  Patient is currently sitting in a wheelchair. HEAD AND NECK: There is no palpable neck lymphadenopathy.  The patient denies acute TMJ symptoms. INTRAORAL EXAM: Patient has normal saliva.  Patient has large bilateral mandibular lingual tori.  There is no evidence of oral abscess formation. DENTITION: Patient has multiple missing teeth.  I would need a full series of dental radiographs or an orthopantogram to identify the exact tooth numbers present/missing. PERIODONTAL: Patient has chronic periodontitis with plaque and calculus accumulations, gingival recession, and tooth mobility.  There is incipient to moderate bone loss noted.  Patient has class II mobility of the lower right most posterior molar. DENTAL CARIES/SUBOPTIMAL RESTORATIONS: Patient has multiple teeth with dental caries.  Dental caries associated with tooth #17 are extensive.  I would need a full series of dental radiographs to identify the extent of the dental caries. ENDODONTIC: Patient currently denies acute pulpitis symptoms.  There is no evidence of oral abscess formation.  The patient appears to have periapical pathology associated with mandibular left and mandibular right posterior molars per review of CT scan.   CROWN AND BRIDGE: No crown or bridge restorations are noted.   PROSTHODONTIC: The patient is a maxillary acrylic partial denture that " fits okay"  by patient report. OCCLUSION: The patient has a poor occlusal scheme and malocclusion noted.  RADIOGRAPHIC INTERPRETATION: Orthopantogram was unable to be obtained at this time due to patient's inability to stand for the orthopantogram.   ASSESSMENTS: 1.  History of acute stroke with residual right  hemiparesis 2.  Active Inpatient rehabilitation services 3.  Chronic apical periodontitis 4.  Dental caries 5.  Chronic periodontitis of bone loss 6.  Gingival recession 7.  Accretions 8.  Tooth mobility 9.  Multiple missing teeth 10.  Maxillary acrylic partial denture 11.  Poor occlusal scheme and malocclusion 12.  Risk of bleeding with invasive dental procedures due to current Lovenox and aspirin therapies 13.  Risk for complications with anticipated dental extraction procedures due to recent acute stroke  PLAN/RECOMMENDATIONS: 1. I discussed the risks, benefits, and complications of various treatment options with the patient in relationship to her medical and dental conditions, risk for another stroke, and risk for complications from bleeding associated with her current medication therapy.  We discussed various treatment options to include no treatment, multiple extractions with alveoloplasty, pre-prosthetic surgery as indicated, periodontal therapy, dental restorations, root canal therapy, crown and bridge therapy, implant therapy, and replacement of missing teeth as indicated. The patient currently wishes to defer dental treatment at this time.  Patient will instead follow-up with her primary dentist in [place], [place] for an exam and radiographs and discussion of possible referral to an oral surgeon for dental extraction procedures as indicated.  Patient is to continue the oral antibiotic therapy for an additional 2 to 3 days.  Medical team is to consult Dental Medicine again if acute symptoms arise during the remainder of this admission.  2. Discussion of findings with medical team and coordination of future medical and dental care as needed.   Lenn Cal, DDS

## 2018-06-24 NOTE — Progress Notes (Signed)
Speech Language Pathology Weekly Progress and Session Note  Patient Details  Name: Judy Boyd MRN: [id number] Date of Birth: June 24, 1961  Beginning of progress report period: June 18, 2018 End of progress report period: June 24, 2018  Today's Date: 06/24/2018 SLP Individual Time: 1500-1600 SLP Individual Time Calculation (min): 60 min  Short Term Goals: Week 1: SLP Short Term Goal 1 (Week 1): Pt will utilize speech intelligibility startegies to increase intelligibility at the phrase level to ~ 75% with Mod A cues.  SLP Short Term Goal 1 - Progress (Week 1): Met SLP Short Term Goal 2 (Week 1): Pt will self-monitor and self-correct unintelligible speech at the phrase level in 7 out of 10 opportunities with Mod A cues.  SLP Short Term Goal 2 - Progress (Week 1): Met SLP Short Term Goal 3 (Week 1): Pt will demonstrate communicate information with supervision cues for word finding.  SLP Short Term Goal 3 - Progress (Week 1): Met SLP Short Term Goal 4 (Week 1): Pt will demonstrate increased anticipatory awareness by identifying safe vs unsafe activites to perform within room with Mod A cues.  SLP Short Term Goal 4 - Progress (Week 1): Met    New Short Term Goals: Week 2: SLP Short Term Goal 1 (Week 2): Pt will utilize speech intelligibility startegies to increase intelligibility at the conversation level to > 80% with Mod I.   SLP Short Term Goal 2 (Week 2): Pt will self-monitor and self-correct unintelligible speech at the simple conversation level with Min A cues.  SLP Short Term Goal 3 (Week 2): Pt will demonstrate increased anticipatory awareness by identifying safe vs unsafe activites within home environment with supervision cues.   Weekly Progress Updates: Pt has made great progress this reporting period and as a result she has met 4 of 4 STGs. Pt has improved speech intelligibility as well as improved understanding of her current deficits. She continues to demonstrate increase  self-awareness but continues to have decreased speech intelligibility at the unstructured noisy environment. Skilled ST continues to be required to target increased speech intelligibility and increased anticipatory awareness. Don't anticipate that pt will require follow up ST services.      Intensity: Minumum of 1-2 x/day, 30 to 90 minutes Frequency: 3 to 5 out of 7 days Duration/Length of Stay: 11/15 Treatment/Interventions: Patient/family education;Speech/Language facilitation;Therapeutic Activities   Daily Session  Skilled Therapeutic Interventions: Skilled ST focused on communication and education on use of intelligibility strategies to communicate information regarding therapies and activities within her day. With supervision questions, pt demonstrates increased anticipatory awareness and awareness of the impact that her CVA has had on her physical independence. Pt left upright in wheelchair with all needs within reach. Continue per current plan of care.     General    Pain Pain Assessment Pain Scale: 0-10 Pain Score: 0-No pain  Therapy/Group: Individual Therapy  Milayna Rotenberg 06/24/2018, 4:27 PM

## 2018-06-24 NOTE — Progress Notes (Signed)
Occupational Therapy Session Note  Patient Details  Name: Judy Boyd MRN: [id number] Date of Birth: 18-Sep-1960  Today's Date: 06/24/2018 OT Individual Time: 0802-0900 OT Individual Time Calculation (min): 58 min    Short Term Goals: Week 1:  OT Short Term Goal 1 (Week 1): pt will don bra wiht min A OT Short Term Goal 2 (Week 1): Pt will complete toilet tranfer wiht CGA OT Short Term Goal 3 (Week 1): Pt will completes bathing sit to stand wiht CGA OT Short Term Goal 4 (Week 1): Pt will don shoes wiht supervision  Skilled Therapeutic Interventions/Progress Updates:    Treatment session with focus on ADL retraining, dynamic standing balance, and functional use of RUE during self-care tasks.  Pt received supine in bed declining shower but wanting to complete grooming and dressing at sink.  Completed bed mobility with supervision and min assist stand pivot transfer to w/c.  Dressing completed with increased time, but pt able to don bra with only supervision cues and setup by donning bra overhead.  Completed LB dressing with close supervision when standing to pull underwear over hips, but requiring min/contact guard when standing to pull pants over hips due to RLE instability.  Engaged in [place] in supine with focus on shoulder abduction and elbow flexion/extension.  Pt able to elicit 633* elbow flexion and extend from 90* to 20* before requiring tactile cues to fully extend elbow.  Engaged in shoulder stability against gravity with pt requiring facilitation to maintain positioning against gravity.  Further focus on shoulder flexion with use of therapy ball with BUE, therapist providing tactile cues at elbow and hand to maintain positioning on ball.  In sitting at edge of mat, pt able to elicit shoulder flexion/extension to push/pull upright stool.  Returned to w/c min guard stand pivot and left upright in w/c in room with chair alarm on and all needs in reach.  Therapy  Documentation Precautions:  Precautions Precautions: Fall Precaution Comments: R hemi Restrictions Weight Bearing Restrictions: No Other Position/Activity Restrictions: R knee instability with weight bearing General:   Vital Signs: Therapy Vitals Pulse Rate: 78 Resp: 18 Patient Position (if appropriate): Lying Oxygen Therapy SpO2: 97 % O2 Device: Room Air Pain: Pain Assessment Pain Scale: 0-10 Pain Score: 0-No pain   Therapy/Group: Individual Therapy  Simonne Come 06/24/2018, 10:31 AM

## 2018-06-24 NOTE — Progress Notes (Signed)
Physical Therapy Session Note  Patient Details  Name: Judy Boyd MRN: [id number] Date of Birth: [birth year]  Today's Date: 06/24/2018 PT Individual Time:1045  - 1200, 75 min     Short Term Goals: Week 1:  PT Short Term Goal 1 (Week 1): Pt will ambulate 50 ft with LRAD & min assist. PT Short Term Goal 2 (Week 1): Pt will negotiate 12 steps with 1 rail with min assist.    Skilled Therapeutic Interventions/Progress Updates:   Toilet transfer for continent voiding, min assist. Pt managed 3/3 toileting tasks with min assist for balance while pulling up panties and pants.  neuromuscular re-education via demo and mulitmodal cues for R knee stance stability without AFO in standing with LUE support, for LLE forward/backward stepping.  Therapeutic activity with [name], Rec. Therapist , for kicking cones in standing with R/L feet, LUE support and mod assist for balance.  When kicking with L foot, pt had ACE around R knee.  Pt required mod/max assistance at R knee to prevent buckling when kicking with L foot. No R hamstring activation detected when kicking with R foot. Pt had improved spontaneous R knee extension in standing compared to 11/6. Other activities: Wii bowling and bowling with actual pins and ball in standing,  focusing on R knee stance stability.  Gait training without AD, ACE on RLE for foot drop, mod assist x 25'.  Pt does not have personal AFO yet, but toe cap has been added to R shoe.      Pt left resting in w/c with seat alarm set, set up for lunch, with needs at hand. Therapy Documentation Precautions:  Precautions Precautions: Fall Precaution Comments: R hemi Restrictions Weight Bearing Restrictions: No Other Position/Activity Restrictions: R knee instability with weight bearing   Pain: none per pt      Therapy/Group: Individual Therapy  [name] 06/24/2018, 3:39 PM

## 2018-06-24 NOTE — Progress Notes (Signed)
[facility] PHYSICAL MEDICINE & REHABILITATION PROGRESS NOTE   Subjective/Complaints:  Mood brighter moving fingers more ROS- neg for CP, SOB, N/V/D Objective:   No results found. Recent Labs    06/23/18 0412  WBC 11.5*  HGB 11.0*  HCT 37.6  PLT 383   Recent Labs    06/23/18 0412  NA 140  K 4.2  CL 110  CO2 28  GLUCOSE 87  BUN 25*  CREATININE 0.66  CALCIUM 9.5    Intake/Output Summary (Last 24 hours) at 06/24/2018 0859 Last data filed at 06/23/2018 1736 Gross per 24 hour  Intake 480 ml  Output -  Net 480 ml     Physical Exam: Vital Signs Blood pressure 133/85, pulse 78, temperature 97.8 F (36.6 C), temperature source Oral, resp. rate 18, height _0  (1.676 m), weight 58.3 kg, SpO2 97 %.  General: No acute distress Mood and affect are appropriate Heart: Regular rate and rhythm no rubs murmurs or extra sounds Lungs: Clear to auscultation, breathing unlabored, no rales or wheezes Abdomen: Positive bowel sounds, soft nontender to palpation, nondistended Extremities: No clubbing, cyanosis, or edema Skin: No evidence of breakdown, no evidence of rash Neurologic: Cranial nerves II through XII intact, motor strength is 5/5 in LEFT   deltoid, bicep, tricep, grip, hip flexor, knee extensors, ankle dorsiflexor and plantar flexor 2- R finger flex/ext, 2- thumb flex Sensory exam normal sensation to light touch and proprioception in bilateral upper and lower extremities Cerebellar exam normal finger to nose to finger as well as heel to shin in bilateral upper and lower extremities Musculoskeletal: Full range of motion in all 4 extremities. No joint swelling    Assessment/Plan: 1. Functional deficits secondary to Left pontine and thalamic infarcts which require 3+ hours per day of interdisciplinary therapy in a comprehensive inpatient rehab setting.  Physiatrist is providing close team supervision and 24 hour management of active medical problems listed  below.  Physiatrist and rehab team continue to assess barriers to discharge/monitor patient progress toward functional and medical goals  Care Tool:  Bathing    Body parts bathed by patient: Right arm, Chest, Abdomen, Front perineal area, Buttocks, Right upper leg, Left upper leg, Right lower leg, Left lower leg, Face, Left arm   Body parts bathed by helper: Left arm     Bathing assist Assist Level: Contact Guard/Touching assist     Upper Body Dressing/Undressing Upper body dressing   What is the patient wearing?: Bra, Pull over shirt    Upper body assist Assist Level: Minimal Assistance - Patient > 75%    Lower Body Dressing/Undressing Lower body dressing      What is the patient wearing?: Underwear/pull up, Pants     Lower body assist Assist for lower body dressing: Contact Guard/Touching assist     Toileting Toileting Toileting Activity did not occur (Clothing management and hygiene only): N/A (no void or bm)  Toileting assist Assist for toileting: Supervision/Verbal cueing     Transfers Chair/bed transfer  Transfers assist     Chair/bed transfer assist level: Minimal Assistance - Patient > 75%     Locomotion Ambulation   Ambulation assist      Assist level: Moderate Assistance - Patient 50 - 74% Assistive device: Orthosis Max distance: 40   Walk 10 feet activity   Assist  Walk 10 feet activity did not occur: Safety/medical concerns  Assist level: Moderate Assistance - Patient - 50 - 74% Assistive device: Orthosis   Walk 50 feet activity  Assist Walk 50 feet with 2 turns activity did not occur: Safety/medical concerns  Assist level: Moderate Assistance - Patient - 50 - 74% Assistive device: Walker-rolling(R hand orthosis, R AFO, R shoe cover)    Walk 150 feet activity   Assist Walk 150 feet activity did not occur: Safety/medical concerns  Assist level: Moderate Assistance - Patient - 50 - 74% Assistive device: Walker-hemi     Walk 10 feet on uneven surface  activity   Assist Walk 10 feet on uneven surfaces activity did not occur: Safety/medical concerns         Wheelchair     Assist Will patient use wheelchair at discharge?: (TBD) Type of Wheelchair: Manual    Wheelchair assist level: Supervision/Verbal cueing Max wheelchair distance: 100 ft    Wheelchair 50 feet with 2 turns activity    Assist    Wheelchair 50 feet with 2 turns activity did not occur: Safety/medical concerns   Assist Level: Supervision/Verbal cueing   Wheelchair 150 feet activity     Assist Wheelchair 150 feet activity did not occur: Safety/medical concerns           Medical Problem List and Plan: 1.   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 DVT Prophylaxis/Anticoagulation: Pharmaceutical: Lovenox 3. Pain Management: Tylenol 4. Mood: We will monitor, may need neuropsych consult, trazodone for sleep- mood flat 5. Neuropsych: This patient is capable of making decisions on her own behalf. 6. Skin/Wound Care: No areas identified we will continue to monitor 7. Fluids/Electrolytes/Nutrition: Recheck C met, monitor I's and O's 8.  COPD Dulera inhaler 2 puffs twice a day, Singulair 10 mg at bedtime No wheezing9.  History of hypertension resume verapamil if pressures remain elevated 1 to 2 weeks post stroke Vitals:   06/24/18 0558 06/24/18 0800  BP: 133/85   Pulse: 74 78  Resp: 18 18  Temp: 97.8 F (36.6 C)   SpO2: 100% 97%  within permissive range cont to monitor- on HCTZ and Calan at home will restart if BP remains elevated- improved 11/7  10.  Mild leukocytosis , odondogenic infxn on Augmentin ?duration, ask DDS for recs , afebrile cont to monitor 11. Mild anemia, no obvious sign of blood loss ,  stool guaic Neg LOS: 7 days A FACE TO FACE EVALUATION WAS PERFORMED  Charlett Blake 06/24/2018, 8:59 AM

## 2018-06-24 NOTE — Progress Notes (Signed)
Occupational Therapy Session Note  Patient Details  Name: Judy Boyd MRN: [id number] Date of Birth: 1961-02-03  Today's Date: 06/24/2018 OT Individual Time: 9021-1155 OT Individual Time Calculation (min): 27 min    Short Term Goals: Week 1:  OT Short Term Goal 1 (Week 1): pt will don bra wiht min A OT Short Term Goal 2 (Week 1): Pt will complete toilet tranfer wiht CGA OT Short Term Goal 3 (Week 1): Pt will completes bathing sit to stand wiht CGA OT Short Term Goal 4 (Week 1): Pt will don shoes wiht supervision  Skilled Therapeutic Interventions/Progress Updates:    Treatment session with focus on RUE NMR.  Pt completed stand pivot transfers throughout session with min assist and focus on RLE stability.  Engaged in [place] in sitting at edge of mat with focus on shoulder flexion, elbow extension, and wrist radial deviation to reach for, pick up a cup, and move it across midline.  Therapist providing manual facilitation to complete full movement, but pt able to initiate all above movements, as well as finger extension to grasp cup.  Pt pleased with finger movement, however continues to require encouragement to draw attention to recovery.  Pt required increased assistance as she fatigued from activity.  Returned to room via w/c and left upright in w/c with chair alarm on and all needs in reach.  Therapy Documentation Precautions:  Precautions Precautions: Fall Precaution Comments: R hemi Restrictions Weight Bearing Restrictions: No Other Position/Activity Restrictions: R knee instability with weight bearing Pain:  Pt with no c/o pain   Therapy/Group: Individual Therapy  Simonne Come 06/24/2018, 3:54 PM

## 2018-06-24 NOTE — Progress Notes (Signed)
Recreational Therapy Session Note  Patient Details  Name: Judy Boyd MRN: [id number] Date of Birth: Feb 15, 1961 Today's Date: 06/24/2018 Time:  1100-1155 Pain: no c/o Skilled Therapeutic Interventions/Progress Updates:  Goals:  Pt will maintain dynamic standing balance during leisure tasks with less than/equal to Mod assist.  Joined PT for treatment session today.  Pt appeared upset and when questioned expressed frustration with safety plan, specifically as it related to toileting needs.  Discussed rehab safety policy and reviewed therapy teams safety concerns in which pt stated understanding but still frustrated.  Transitioned to standing activity with LUE support to kick cones.  Pt stood weightshifting/weightbearing on LLE to kick the cones with RLE with min-mod assist.  Also performed kicks with LLE with ace wrap around R knee for support during weightbearing with mod-max assist.  Discussed previously enjoyed activities and determined that pt enjoyed gardening and house plants in the past.  Plan to incorporate garden activity into therapy sessions in the future.  Remainder of the session continued to focus on dynamic standing balance using LUE for bowling task.  Pt stood to bowl with mod assist and mod instructional cues for foot placement /stance.  Pt's affect improved during the session with frequent smiles and occasional  laughter by the end of the session.    Therapy/Group: Co-Treatment   Insiya Oshea 06/24/2018, 8:42 AM

## 2018-06-25 ENCOUNTER — Inpatient Hospital Stay (HOSPITAL_COMMUNITY): Payer: Medicaid Other | Admitting: Occupational Therapy

## 2018-06-25 ENCOUNTER — Inpatient Hospital Stay (HOSPITAL_COMMUNITY): Payer: Medicaid Other | Admitting: Speech Pathology

## 2018-06-25 ENCOUNTER — Inpatient Hospital Stay (HOSPITAL_COMMUNITY): Payer: Medicaid Other | Admitting: Physical Therapy

## 2018-06-25 LAB — OCCULT BLOOD X 1 CARD TO LAB, STOOL: Fecal Occult Bld: NEGATIVE

## 2018-06-25 NOTE — Progress Notes (Signed)
Occupational Therapy Weekly Progress Note  Patient Details  Name: Judy Boyd MRN: [id number] Date of Birth: 02-Jun-1961  Beginning of progress report period: June 18, 2018 End of progress report period: June 25, 2018  Today's Date: 06/25/2018 OT Individual Time: 0800-0900 OT Individual Time Calculation (min): 60 min    Patient has met 3 of 4 short term goals.  Pt is making steady progress towards goals.  Pt currently requires contact guard to min assist for stand pivot transfers due to Rt knee instability.  Pt currently requires contact guard with bathing and dressing tasks and is demonstrating improvements with dressing with ability to don bra overhead with supervision with increased time.  Pt is progressing towards donning shoes, however due to Rt AFO pt continues to require assistance and setup with AE.  Pt is developing shoulder flexion/extension and abduction/adduction with gravity eliminated and is able to elicit elbow flexion/extension and some finger movement against gravity.  Pt continues to be down about her progress, will benefit from neuropsychologist visit and continued encouragement and recognition of daily progress.  Patient continues to demonstrate the following deficits: muscle weakness, decreased cardiorespiratoy endurance, impaired timing and sequencing, abnormal tone, unbalanced muscle activation, motor apraxia, decreased coordination and decreased motor planning, decreased visual motor skills, decreased attention to right and decreased motor planning, decreased attention, decreased awareness, decreased problem solving, decreased safety awareness, decreased memory and delayed processing and decreased sitting balance, decreased standing balance, decreased postural control, hemiplegia and decreased balance strategies and therefore will continue to benefit from skilled OT intervention to enhance overall performance with BADL and Reduce care partner burden.  Patient  progressing toward long term goals..  Continue plan of care.  OT Short Term Goals Week 1:  OT Short Term Goal 1 (Week 1): pt will don bra wiht min A OT Short Term Goal 1 - Progress (Week 1): Met OT Short Term Goal 2 (Week 1): Pt will complete toilet tranfer wiht CGA OT Short Term Goal 2 - Progress (Week 1): Met OT Short Term Goal 3 (Week 1): Pt will completes bathing sit to stand wiht CGA OT Short Term Goal 3 - Progress (Week 1): Met OT Short Term Goal 4 (Week 1): Pt will don shoes wiht supervision OT Short Term Goal 4 - Progress (Week 1): Progressing toward goal Week 2:  OT Short Term Goal 1 (Week 2): STG = LTGs due to remaining LOS  Skilled Therapeutic Interventions/Progress Updates:    Treatment session with focus on ADL retraining with functional transfers, dynamic standing balance, and functional use of RUE.  Pt received upright in bed agreeable to shower.  Pt completed stand pivot transfer bed > w/c > toilet with min assist.  Pt continent of bowel and bladder with min assist for standing balance when adjusting clothing and pt able to complete hygiene with lateral leans on toilet.  Ambulated 4-5' toilet to room shower along grab bar with therapist providing min assist and intermittent support at Rt knee for stability.  Pt completed bathing at sit > stand level with contact guard when standing to wash buttocks.  Pt completed dressing with contact guard when standing to pull underwear and pants over hips and UB dressing to include donning bra with supervision/cues for problem solving as bra got twisted.  Engaged in transfer training in ADL apartment with focus on toilet and tub/shower transfers.  Pt ambulated 10' with RW in to bathroom with min assist and completed tub/shower transfer with tub transfer bench with min assist  to advance RLE over tub ledge.  Hand over hand assist to maintain RUE on RW during ambulation, will benefit from hand orthosis on RW.  Engaged in blocked practice of stand pivot  transfers w/c <> toilet with pt completing with min assist and increased stability of RLE.  Returned to room and left upright in w/c with all needs in reach and chair alarm on.  Therapy Documentation Precautions:  Precautions Precautions: Fall Precaution Comments: R hemi Restrictions Weight Bearing Restrictions: No Other Position/Activity Restrictions: R knee instability with weight bearing General:   Vital Signs: Therapy Vitals Temp: 97.8 F (36.6 C) Temp Source: Oral Pulse Rate: 77 Resp: 18 BP: (!) 151/78 Patient Position (if appropriate): Sitting Oxygen Therapy SpO2: 100 % O2 Device: Room Air Pain:   Pt with no c/o pain   Therapy/Group: Individual Therapy  Simonne Come 06/25/2018, 7:50 AM

## 2018-06-25 NOTE — Progress Notes (Signed)
Physical Therapy Weekly Progress Note  Patient Details  Name: Judy Boyd MRN: [id number] Date of Birth: 1960-10-07  Beginning of progress report period: June 18, 2018 End of progress report period: June 25, 2018  Today's Date: 06/25/2018 PT Individual Time: 1345-1430 PT Individual Time Calculation (min): 45 min   Patient has met 1 of 2 short term goals.  Pt has made good progress and is Supervision with bed mobility, CGA to min A with transfers, and min A with gait with RW with R hand orthosis up to 100 ft. Pt has been ambulating with use of RLE GRAFO and toe cap to assist with RLE clearance and advancement with gait. Pt is able to ascend/descend 4 6" stairs with 1-2 handrails and min A, working towards goal of 12 stairs.  Patient continues to demonstrate the following deficits muscle weakness, impaired timing and sequencing, abnormal tone, unbalanced muscle activation and decreased coordination and decreased standing balance, decreased postural control, hemiplegia and decreased balance strategies and therefore will continue to benefit from skilled PT intervention to increase functional independence with mobility.  Patient progressing toward long term goals..  Continue plan of care.  PT Short Term Goals Week 1:  PT Short Term Goal 1 (Week 1): Pt will ambulate 50 ft with LRAD & min assist. PT Short Term Goal 1 - Progress (Week 1): Met PT Short Term Goal 2 (Week 1): Pt will negotiate 12 steps with 1 rail with min assist. PT Short Term Goal 2 - Progress (Week 1): Progressing toward goal Week 2:  PT Short Term Goal 1 (Week 2): =LTG due to ELOS  Skilled Therapeutic Interventions/Progress Updates:  Pt received seated in w/c in therapy gym from OT session, no complaints of pain. Sit to stand with CGA to RW. Ambulation x 100 ft with RW and R hand orthosis, R toe cap and GRAFO. Pt has decreased RLE clearance with onset of fatigue and needs increased cueing and assist with turns for  safety. Ascend/descend 4 6" stairs with 1-2 handrails (intermittent use of RUE on rail) and min A, step-to gait pattern. Pt circumducts RLE to advance on stairs due to hip flexor weakness. Sit to/from supine with Supervision. Supine BLE therex x 10 reps with AAROM for LLE: bridges, heel slides, BKFO. Pt left seated in w/c in room with needs in reach at end of therapy session.  Therapy Documentation Precautions:  Precautions Precautions: Fall Precaution Comments: R hemi Restrictions Weight Bearing Restrictions: No Other Position/Activity Restrictions: R knee instability with weight bearing   Therapy/Group: Individual Therapy  Excell Seltzer, PT, DPT  06/25/2018, 4:12 PM

## 2018-06-25 NOTE — Progress Notes (Signed)
Occupational Therapy Session Note  Patient Details  Name: Judy Boyd MRN: [id number] Date of Birth: 1960/09/20  Today's Date: 06/25/2018 OT Individual Time: 8546-2703 OT Individual Time Calculation (min): 40 min    Short Term Goals: Week 2:  OT Short Term Goal 1 (Week 2): STG = LTGs due to remaining LOS  Skilled Therapeutic Interventions/Progress Updates:    Treatment session with focus on RUE NMR.  Applied NMES to Rt forearm to stimulate finger and wrist extension. Pt not able to tolerate very high level of stimulation therefore limited impact.  However utilized in conjunction with grasp and release on object with pt able to grasp item and then with use of e-stim attempt to release object.  Post stimulation continued to focus on finger flexion and extension in conjunction with grasp and release of objects.  Therapist providing support at wrist and elbow to minimize gravity to allow for increased reach.  Pt passed off to PT.  Ratio 1:3 Rate 35 pps Waveform- Asymmetric Ramp 1.0 Pulse 300 Intensity- 11 Duration -   8 mins   No adverse reactions after treatment and is skin intact.    Therapy Documentation Precautions:  Precautions Precautions: Fall Precaution Comments: R hemi Restrictions Weight Bearing Restrictions: No Other Position/Activity Restrictions: R knee instability with weight bearing General:   Vital Signs: Therapy Vitals Temp: 98.7 F (37.1 C) Pulse Rate: 87 Resp: 17 BP: (!) 144/84 Patient Position (if appropriate): Lying Oxygen Therapy SpO2: 100 % O2 Device: Room Air Pain: Pain Assessment Pain Scale: 0-10 Pain Score: 0-No pain   Therapy/Group: Individual Therapy  Simonne Come 06/25/2018, 6:03 PM

## 2018-06-25 NOTE — Progress Notes (Signed)
[facility] PHYSICAL MEDICINE & REHABILITATION PROGRESS NOTE   Subjective/Complaints: Finished working with OT this am, no new c/os , has hemi lap tray ROS- neg for CP, SOB, N/V/D Objective:   No results found. Recent Labs    06/23/18 0412  WBC 11.5*  HGB 11.0*  HCT 37.6  PLT 383   Recent Labs    06/23/18 0412  NA 140  K 4.2  CL 110  CO2 28  GLUCOSE 87  BUN 25*  CREATININE 0.66  CALCIUM 9.5    Intake/Output Summary (Last 24 hours) at 06/25/2018 0908 Last data filed at 06/24/2018 1850 Gross per 24 hour  Intake 480 ml  Output -  Net 480 ml     Physical Exam: Vital Signs Blood pressure (!) 151/78, pulse 77, temperature 97.8 F (36.6 C), temperature source Oral, resp. rate 18, height _0  (1.676 m), weight 58.3 kg, SpO2 100 %.  General: No acute distress Mood and affect are appropriate Heart: Regular rate and rhythm no rubs murmurs or extra sounds Lungs: Clear to auscultation, breathing unlabored, no rales or wheezes Abdomen: Positive bowel sounds, soft nontender to palpation, nondistended Extremities: No clubbing, cyanosis, or edema Skin: No evidence of breakdown, no evidence of rash Neurologic: Cranial nerves II through XII intact, motor strength is 5/5 in LEFT   deltoid, bicep, tricep, grip, hip flexor, knee extensors, ankle dorsiflexor and plantar flexor 2- R finger flex/ext, 2- thumb flex Sensory exam normal sensation to light touch and proprioception in bilateral upper and lower extremities Cerebellar exam normal finger to nose to finger as well as heel to shin in bilateral upper and lower extremities Musculoskeletal: Full range of motion in all 4 extremities. No joint swelling    Assessment/Plan: 1. Functional deficits secondary to Left pontine and thalamic infarcts which require 3+ hours per day of interdisciplinary therapy in a comprehensive inpatient rehab setting.  Physiatrist is providing close team supervision and 24 hour management of active  medical problems listed below.  Physiatrist and rehab team continue to assess barriers to discharge/monitor patient progress toward functional and medical goals  Care Tool:  Bathing    Body parts bathed by patient: Right arm, Chest, Abdomen, Front perineal area, Buttocks, Right upper leg, Left upper leg, Right lower leg, Left lower leg, Face, Left arm   Body parts bathed by helper: Left arm     Bathing assist Assist Level: Contact Guard/Touching assist     Upper Body Dressing/Undressing Upper body dressing   What is the patient wearing?: Bra, Pull over shirt    Upper body assist Assist Level: Supervision/Verbal cueing    Lower Body Dressing/Undressing Lower body dressing      What is the patient wearing?: Underwear/pull up, Pants     Lower body assist Assist for lower body dressing: Contact Guard/Touching assist     Toileting Toileting Toileting Activity did not occur (Clothing management and hygiene only): N/A (no void or bm)  Toileting assist Assist for toileting: Minimal Assistance - Patient > 75%     Transfers Chair/bed transfer  Transfers assist     Chair/bed transfer assist level: Minimal Assistance - Patient > 75%     Locomotion Ambulation   Ambulation assist      Assist level: Moderate Assistance - Patient 50 - 74% Assistive device: Hand held assist Max distance: 25   Walk 10 feet activity   Assist  Walk 10 feet activity did not occur: Safety/medical concerns  Assist level: Moderate Assistance - Patient - 40 -  74% Assistive device: Orthosis   Walk 50 feet activity   Assist Walk 50 feet with 2 turns activity did not occur: Safety/medical concerns  Assist level: Moderate Assistance - Patient - 50 - 74% Assistive device: Walker-rolling(R hand orthosis, R AFO, R shoe cover)    Walk 150 feet activity   Assist Walk 150 feet activity did not occur: Safety/medical concerns  Assist level: Moderate Assistance - Patient - 50 -  74% Assistive device: Walker-hemi    Walk 10 feet on uneven surface  activity   Assist Walk 10 feet on uneven surfaces activity did not occur: Safety/medical concerns         Wheelchair     Assist Will patient use wheelchair at discharge?: (TBD) Type of Wheelchair: Manual    Wheelchair assist level: Supervision/Verbal cueing Max wheelchair distance: 100 ft    Wheelchair 50 feet with 2 turns activity    Assist    Wheelchair 50 feet with 2 turns activity did not occur: Safety/medical concerns   Assist Level: Supervision/Verbal cueing   Wheelchair 150 feet activity     Assist Wheelchair 150 feet activity did not occur: Safety/medical concerns           Medical Problem List and Plan: 1.   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 DVT Prophylaxis/Anticoagulation: Pharmaceutical: Lovenox 3. Pain Management: Tylenol 4. Mood: We will monitor, may need neuropsych consult, trazodone for sleep- mood flat 5. Neuropsych: This patient is capable of making decisions on her own behalf. 6. Skin/Wound Care: No areas identified we will continue to monitor 7. Fluids/Electrolytes/Nutrition: Recheck C met, monitor I's and O's 8.  COPD Dulera inhaler 2 puffs twice a day, Singulair 10 mg at bedtime No wheezing9.  History of hypertension resume verapamil if pressures remain elevated 1 to 2 weeks post stroke Vitals:   06/24/18 2047 06/25/18 0532  BP:  (!) 151/78  Pulse:  77  Resp:  18  Temp:  97.8 F (36.6 C)  SpO2: 99% 100%  within permissive range cont to monitor- on HCTZ and Calan has been normotensive until this am    10.  Mild leukocytosis , odondogenic infxn on Augmentin 14d per DDS  afebrile cont to monitor 11. Mild anemia, no obvious sign of blood loss ,  stool guaic Neg LOS: 8 days A FACE TO FACE EVALUATION WAS PERFORMED  Charlett Blake 06/25/2018, 9:08 AM

## 2018-06-25 NOTE — Progress Notes (Signed)
Speech Language Pathology Daily Session Note  Patient Details  Name: Judy Boyd MRN: [id number] Date of Birth: [birth year]  Today's Date: 06/25/2018 SLP Individual Time: 0930-1030 SLP Individual Time Calculation (min): 60 min  Short Term Goals: Week 2: SLP Short Term Goal 1 (Week 2): Pt will utilize speech intelligibility startegies to increase intelligibility at the conversation level to > 80% with Mod I.   SLP Short Term Goal 2 (Week 2): Pt will self-monitor and self-correct unintelligible speech at the simple conversation level with Min A cues.  SLP Short Term Goal 3 (Week 2): Pt will demonstrate increased anticipatory awareness by identifying safe vs unsafe activites within home environment with supervision cues.   Skilled Therapeutic Interventions:  Skilled treatment session focused on communication and cognition/education on deficits. SLP facilitated session by providing craft project that targeted UE deficits to provide distraction from structured use of speech intelligibility strategies. Pt with increased Mod I use of self-monitoring and self-correcting. As a result, she was able to achieve > 90% intelligibility in simple conversation while performing moderately distracting task in a moderately nosey environment. To target problem solving and anticipatory awareness of RUE, pt given opportunities to solve and perform tasks with left hand. Pt able to provide hypothetical solutions to situations. Pt was returned to room, left upright in wheelchair chair alarm on and all needs within reach. Continue per current plan of care.      Pain Pain Assessment Pain Scale: 0-10 Pain Score: 0-No pain  Therapy/Group: Individual Therapy  Judy Boyd 06/25/2018, 2:42 PM

## 2018-06-25 NOTE — Progress Notes (Signed)
Nutrition Education Note  RD consulted for nutrition education regarding a Heart Healthy diet.   Lipid Panel     Component Value Date/Time   CHOL 222 (H) 06/13/2018 0613   TRIG 131 06/13/2018 0613   HDL 53 06/13/2018 0613   CHOLHDL 4.2 06/13/2018 0613   VLDL 26 06/13/2018 0613   LDLCALC 143 (H) 06/13/2018 0613    RD provided "Heart Healthy Nutrition Therapy" handout from the [employer]. Reviewed patient's dietary recall. Provided examples on ways to decrease sodium and fat intake in diet. Discouraged intake of processed foods and use of salt shaker. Encouraged fresh fruits and vegetables as well as whole grain sources of carbohydrates to maximize fiber intake. Teach back method used.  Expect fair compliance.  Body mass index is 20.74 kg/m. Pt meets criteria for normal based on current BMI.  Current diet order is Heart Healthy, patient is consuming approximately 75-100% of meals at this time. Labs and medications reviewed. No further nutrition interventions warranted at this time. RD contact information provided. If additional nutrition issues arise, please re-consult RD.  Althea Grimmer, MS, RDN, LDN On-call pager: [phone]

## 2018-06-26 ENCOUNTER — Encounter (HOSPITAL_COMMUNITY): Payer: Self-pay | Admitting: *Deleted

## 2018-06-26 NOTE — Progress Notes (Signed)
[place] PHYSICAL MEDICINE & REHABILITATION PROGRESS NOTE   Subjective/Complaints: No new issues ROS- neg for CP, SOB, N/V/D Objective:   No results found. No results for input(s): WBC, HGB, HCT, PLT in the last 72 hours. No results for input(s): NA, K, CL, CO2, GLUCOSE, BUN, CREATININE, CALCIUM in the last 72 hours.  Intake/Output Summary (Last 24 hours) at 06/26/2018 0840 Last data filed at 06/25/2018 1800 Gross per 24 hour  Intake 720 ml  Output -  Net 720 ml     Physical Exam: Vital Signs Blood pressure 119/76, pulse 84, temperature 98 F (36.7 C), temperature source Oral, resp. rate 18, height _0  (1.676 m), weight 58.3 kg, SpO2 98 %.  General: No acute distress Mood and affect are appropriate Heart: Regular rate and rhythm no rubs murmurs or extra sounds Lungs: Clear to auscultation, breathing unlabored, no rales or wheezes Abdomen: Positive bowel sounds, soft nontender to palpation, nondistended Extremities: No clubbing, cyanosis, or edema Skin: No evidence of breakdown, no evidence of rash Neurologic: Cranial nerves II through XII intact, motor strength is 5/5 in LEFT   deltoid, bicep, tricep, grip, hip flexor, knee extensors, ankle dorsiflexor and plantar flexor 3- R finger flex/ext, 3- thumb flex Sensory exam normal sensation to light touch and proprioception in bilateral upper and lower extremities Cerebellar exam normal finger to nose to finger as well as heel to shin in bilateral upper and lower extremities Musculoskeletal: Full range of motion in all 4 extremities. No joint swelling    Assessment/Plan: 1. Functional deficits secondary to Left pontine and thalamic infarcts which require 3+ hours per day of interdisciplinary therapy in a comprehensive inpatient rehab setting.  Physiatrist is providing close team supervision and 24 hour management of active medical problems listed below.  Physiatrist and rehab team continue to assess barriers to  discharge/monitor patient progress toward functional and medical goals  Care Tool:  Bathing    Body parts bathed by patient: Right arm, Chest, Abdomen, Front perineal area, Buttocks, Right upper leg, Left upper leg, Right lower leg, Left lower leg, Face, Left arm   Body parts bathed by helper: Left arm     Bathing assist Assist Level: Contact Guard/Touching assist     Upper Body Dressing/Undressing Upper body dressing   What is the patient wearing?: Bra, Pull over shirt    Upper body assist Assist Level: Supervision/Verbal cueing    Lower Body Dressing/Undressing Lower body dressing      What is the patient wearing?: Underwear/pull up, Pants     Lower body assist Assist for lower body dressing: Contact Guard/Touching assist     Toileting Toileting Toileting Activity did not occur (Clothing management and hygiene only): N/A (no void or bm)  Toileting assist Assist for toileting: Minimal Assistance - Patient > 75%     Transfers Chair/bed transfer  Transfers assist     Chair/bed transfer assist level: Minimal Assistance - Patient > 75%     Locomotion Ambulation   Ambulation assist      Assist level: Moderate Assistance - Patient 50 - 74% Assistive device: Walker-rolling Max distance: 100'   Walk 10 feet activity   Assist  Walk 10 feet activity did not occur: Safety/medical concerns  Assist level: Minimal Assistance - Patient > 75% Assistive device: Walker-rolling, Orthosis   Walk 50 feet activity   Assist Walk 50 feet with 2 turns activity did not occur: Safety/medical concerns  Assist level: Minimal Assistance - Patient > 75% Assistive device: Walker-rolling, Orthosis  Walk 150 feet activity   Assist Walk 150 feet activity did not occur: Safety/medical concerns  Assist level: Moderate Assistance - Patient - 50 - 74% Assistive device: Walker-hemi    Walk 10 feet on uneven surface  activity   Assist Walk 10 feet on uneven surfaces  activity did not occur: Safety/medical concerns         Wheelchair     Assist Will patient use wheelchair at discharge?: (TBD) Type of Wheelchair: Manual    Wheelchair assist level: Supervision/Verbal cueing Max wheelchair distance: 100 ft    Wheelchair 50 feet with 2 turns activity    Assist    Wheelchair 50 feet with 2 turns activity did not occur: Safety/medical concerns   Assist Level: Supervision/Verbal cueing   Wheelchair 150 feet activity     Assist Wheelchair 150 feet activity did not occur: Safety/medical concerns           Medical Problem List and Plan: 1.   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 DVT Prophylaxis/Anticoagulation: Pharmaceutical: Lovenox 3. Pain Management: Tylenol 4. Mood: We will monitor, may need neuropsych consult, trazodone for sleep- mood flat 5. Neuropsych: This patient is capable of making decisions on her own behalf. 6. Skin/Wound Care: No areas identified we will continue to monitor 7. Fluids/Electrolytes/Nutrition: Recheck C met, monitor I's and O's 8.  COPD Dulera inhaler 2 puffs twice a day, Singulair 10 mg at bedtime No wheezing     9.  History of hypertension resume verapamil if pressures remain elevated 1 to 2 weeks post stroke Vitals:   06/26/18 0632 06/26/18 0759  BP: 119/76   Pulse: 84   Resp: 18   Temp: 98 F (36.7 C)   SpO2: 100% 98%  normotensive 11/9    10.  Mild leukocytosis , odondogenic infxn on Augmentin 14d per DDS  afebrile cont to monitor, D.C. On 11/13 11. Mild anemia, no obvious sign of blood loss ,  stool guaic Neg LOS: 9 days A FACE TO FACE EVALUATION WAS PERFORMED  Charlett Blake 06/26/2018, 8:40 AM

## 2018-06-28 ENCOUNTER — Inpatient Hospital Stay (HOSPITAL_COMMUNITY): Payer: Medicaid Other | Admitting: Physical Therapy

## 2018-06-28 ENCOUNTER — Inpatient Hospital Stay (HOSPITAL_COMMUNITY): Payer: Medicaid Other | Admitting: Speech Pathology

## 2018-06-28 ENCOUNTER — Inpatient Hospital Stay (HOSPITAL_COMMUNITY): Payer: Medicaid Other

## 2018-06-28 MED ORDER — HYDROCHLOROTHIAZIDE 12.5 MG PO CAPS
12.5000 mg | ORAL_CAPSULE | Freq: Every day | ORAL | Status: DC
  Administered 2018-06-28 – 2018-07-01 (×4): 12.5 mg via ORAL
  Filled 2018-06-28 (×4): qty 1

## 2018-06-28 MED ORDER — CYCLOBENZAPRINE HCL 5 MG PO TABS: 5.0000 mg | ORAL_TABLET | Freq: Three times a day (TID) | ORAL | Status: DC | PRN

## 2018-06-28 NOTE — Progress Notes (Signed)
Speech Language Pathology Daily Session Note  Patient Details  Name: Judy Boyd MRN: [id number] Date of Birth: 1961-08-06  Today's Date: 06/28/2018 SLP Individual Time: 1300-1345 SLP Individual Time Calculation (min): 45 min  Short Term Goals: Week 2: SLP Short Term Goal 1 (Week 2): Pt will utilize speech intelligibility startegies to increase intelligibility at the conversation level to > 80% with Mod I.   SLP Short Term Goal 2 (Week 2): Pt will self-monitor and self-correct unintelligible speech at the simple conversation level with Min A cues.  SLP Short Term Goal 3 (Week 2): Pt will demonstrate increased anticipatory awareness by identifying safe vs unsafe activites within home environment with supervision cues.   Skilled Therapeutic Interventions:  Skilled treatment session focused on communication and education. SLP facilitiated session by providing Mod I support when pt communicating discharge plans. Pt able to describe Outpatient therapy, FMLA etc with > 90% accuracy. Pt was aware that rate of speech was too fast and self-corrected x 1 throughout conversation. Moderate noise introduced and pt able to compensate with in creased vocal intensity to maintain intelligibility. Pt left upright in wheelchair with chair alarm on and all needs within reach. Continue per current plan of care.      Pain Pain Assessment Pain Scale: 0-10 Pain Score: 0-No pain  Therapy/Group: Individual Therapy  Warren Lindahl 06/28/2018, 2:02 PM

## 2018-06-28 NOTE — Progress Notes (Signed)
Occupational Therapy Session Note  Patient Details  Name: Judy Boyd MRN: [id number] Date of Birth: 1961/04/03  Today's Date: 06/28/2018 OT Individual Time: 1517-6160 OT Individual Time Calculation (min): 73 min    Short Term Goals: Week 2:  OT Short Term Goal 1 (Week 2): STG = LTGs due to remaining LOS  Skilled Therapeutic Interventions/Progress Updates:    1;1. Pt received in bed. No c/o pain however describes muscle spasms impacting sleep. Pt supine>sitting with HOB elevated> stand pivot transfer to w/c with supervision/set up. Pt completes bathing with HOH A for RUE to wash LUE and pt declines washing LB/changing pants. Pt completes UB dressing with VC for doffing dirty shirt with hemi sequencing. In tx gym pt tolerates well15 min NMES on small mucle atrophy setting 12 with minimal results d/t sensitivty when applied to wrist extensors. Pt incorporates grasp of foam block when stimulation not present timing release of yellow foam block with stimulation of extensors and OT facilitation of last 30% end range of gross release. Pt grasps stool and completes shoulder/elbow flexion/extension to target 3x15 with OT limiting trunk compensatory movement for RUE NMR. Exited session with pt seated in w/c, call light in reach and all needs met.  Therapy Documentation Precautions:  Precautions Precautions: Fall Precaution Comments: R hemi Restrictions Weight Bearing Restrictions: No Other Position/Activity Restrictions: R knee instability with weight bearing General:   Vital Signs: Therapy Vitals Temp: 98.1 F (36.7 C) Temp Source: Oral Pulse Rate: 82 Resp: 17 BP: (!) 140/97 Patient Position (if appropriate): Lying Oxygen Therapy SpO2: 100 % Pain:   ADL: ADL Grooming: Minimal assistance(standing) Where Assessed-Grooming: Standing at sink Upper Body Bathing: Minimal assistance Where Assessed-Upper Body Bathing: Shower Lower Body Bathing: Minimal assistance Where  Assessed-Lower Body Bathing: Shower Upper Body Dressing: Moderate assistance Where Assessed-Upper Body Dressing: Wheelchair, Sitting at sink Lower Body Dressing: Minimal assistance Where Assessed-Lower Body Dressing: Wheelchair, Sitting at sink, Standing at sink Toileting: Minimal assistance Toilet Transfer: Minimal assistance Toilet Transfer Method: Stand pivot Science writer: Energy manager: Environmental education officer Method: Radiographer, therapeutic: Radio broadcast assistant, Systems analyst    Praxis   Exercises:   Other Treatments:     Therapy/Group: Individual Therapy  Tonny Branch 06/28/2018, 7:10 AM

## 2018-06-28 NOTE — Progress Notes (Addendum)
Physical Therapy Session Note  Patient Details  Name: Judy Boyd MRN: [id number] Date of Birth: 1961-02-02  Today's Date: 06/28/2018 PT Individual Time: 1038-1202 PT Individual Time Calculation (min): 84 min   Short Term Goals: Week 2:  PT Short Term Goal 1 (Week 2): =LTG due to ELOS  Skilled Therapeutic Interventions/Progress Updates:  Pt received in w/c & agreeable to tx, no c/o pain reported. Pt has already received personal R GRAFO & toe cap. Transported pt to gym via w/c assist for time management. Pt manages RUE on hand orthosis with min cuing and completes sit>stand with CGA. Pt ambulates 200 ft with RW, R GRAFO, R toe cap, & R hand orthosis with CGA>min assist on one occasion 2/2 R knee instability but pt able to correct. Therapist provides min cuing for increased RLE swing through during task. Discussed stair negotiation with pt reporting her daughter has a level entry at her house but pt will have to negotiate ~4 steps with 1 rail (has wide set B rails) to access her house. Educated pt & demonstrated negotiating stairs forwards with 1 rail or laterally with 1 rail. Pt negotiates 2 steps laterally with mod assist 2/2 difficulty placing RLE on/off step 2/2 impaired neuromuscular control. Pt negotiates 4 steps + 8 steps forwards with 1 rail ascending with CGA<>min assist with instructional cuing for compensatory pattern & pt demonstrating decreased R hip/knee flexion to clear step and instead abducts LE. Pt performed lateral step ups on 3" step with LUE support and mod assist with task focusing on R hip/knee flexion, strengthening & NMR with pt able to complete a max of 12 reps at a time before requiring a seated rest break 2/2 RLE fatigue; pt was able to perform task which is a great improvement compared to last week with therapist providing cuing for technique. Pt utilized nu-step on level 3 with BLE only x 3 1/2 minutes + 3 minutes with task focusing on pt actively maintaining RLE  neutral alignment, BLE strengthening, endurance training, & R NMR with pt requiring rest breaks 2/2 fatigue. Pt utilized dynavision x 3 minutes + 3 minutes while standing on airex foam with min assist progressing to close supervision for dynamic balance with pt able to maintain R knee extension during standing and about equal reaction time to L/R side of board (slowest reaction time to [facility]). Pt completed bed mobility in apartment with supervision; pt required UE assistance for RLE during initial attempt but was able to place RLE on bed without UE assistance the 2nd time. At end of session pt left sitting in w/c with chair alarm donned, call bell in reach & set up with meal tray.   Therapy Documentation Precautions:  Precautions Precautions: Fall Precaution Comments: R hemi Restrictions Weight Bearing Restrictions: No Other Position/Activity Restrictions: R knee instability with weight bearing    Therapy/Group: Individual Therapy  Waunita Schooner 06/28/2018, 12:17 PM

## 2018-06-28 NOTE — Progress Notes (Addendum)
[place] PHYSICAL MEDICINE & REHABILITATION PROGRESS NOTE   Subjective/Complaints: Seen in gym, requesting grounds pass ROS- neg for CP, SOB, N/V/D Objective:   No results found. No results for input(s): WBC, HGB, HCT, PLT in the last 72 hours. No results for input(s): NA, K, CL, CO2, GLUCOSE, BUN, CREATININE, CALCIUM in the last 72 hours.  Intake/Output Summary (Last 24 hours) at 06/28/2018 0733 Last data filed at 06/27/2018 1700 Gross per 24 hour  Intake 540 ml  Output -  Net 540 ml     Physical Exam: Vital Signs Blood pressure (!) 140/97, pulse 82, temperature 98.1 F (36.7 C), temperature source Oral, resp. rate 17, height '5\' 6"'$  (1.676 m), weight 58.3 kg, SpO2 100 %.  General: No acute distress Mood and affect are appropriate Heart: Regular rate and rhythm no rubs murmurs or extra sounds Lungs: Clear to auscultation, breathing unlabored, no rales or wheezes Abdomen: Positive bowel sounds, soft nontender to palpation, nondistended Extremities: No clubbing, cyanosis, or edema Skin: No evidence of breakdown, no evidence of rash Neurologic: Cranial nerves II through XII intact, motor strength is 5/5 in LEFT   deltoid, bicep, tricep, grip, hip flexor, knee extensors, ankle dorsiflexor and plantar flexor 3- R finger flex/ext, 3- thumb flex Sensory exam normal sensation to light touch and proprioception in bilateral upper and lower extremities Cerebellar exam normal finger to nose to finger as well as heel to shin in bilateral upper and lower extremities Musculoskeletal: Full range of motion in all 4 extremities. No joint swelling    Assessment/Plan: 1. Functional deficits secondary to Left pontine and thalamic infarcts which require 3+ hours per day of interdisciplinary therapy in a comprehensive inpatient rehab setting.  Physiatrist is providing close team supervision and 24 hour management of active medical problems listed below.  Physiatrist and rehab team continue to  assess barriers to discharge/monitor patient progress toward functional and medical goals  Care Tool:  Bathing    Body parts bathed by patient: Right arm, Chest, Abdomen, Front perineal area, Buttocks, Right upper leg, Left upper leg, Right lower leg, Left lower leg, Face, Left arm   Body parts bathed by helper: Left arm     Bathing assist Assist Level: Contact Guard/Touching assist     Upper Body Dressing/Undressing Upper body dressing   What is the patient wearing?: Bra, Pull over shirt    Upper body assist Assist Level: Supervision/Verbal cueing    Lower Body Dressing/Undressing Lower body dressing      What is the patient wearing?: Underwear/pull up, Pants     Lower body assist Assist for lower body dressing: Contact Guard/Touching assist     Toileting Toileting Toileting Activity did not occur (Clothing management and hygiene only): N/A (no void or bm)  Toileting assist Assist for toileting: Minimal Assistance - Patient > 75%     Transfers Chair/bed transfer  Transfers assist     Chair/bed transfer assist level: Minimal Assistance - Patient > 75%     Locomotion Ambulation   Ambulation assist      Assist level: Moderate Assistance - Patient 50 - 74% Assistive device: Walker-rolling Max distance: 100'   Walk 10 feet activity   Assist  Walk 10 feet activity did not occur: Safety/medical concerns  Assist level: Minimal Assistance - Patient > 75% Assistive device: Walker-rolling, Orthosis   Walk 50 feet activity   Assist Walk 50 feet with 2 turns activity did not occur: Safety/medical concerns  Assist level: Minimal Assistance - Patient > 75%  Assistive device: Walker-rolling, Orthosis    Walk 150 feet activity   Assist Walk 150 feet activity did not occur: Safety/medical concerns  Assist level: Moderate Assistance - Patient - 50 - 74% Assistive device: Walker-hemi    Walk 10 feet on uneven surface  activity   Assist Walk 10 feet  on uneven surfaces activity did not occur: Safety/medical concerns         Wheelchair     Assist Will patient use wheelchair at discharge?: (TBD) Type of Wheelchair: Manual    Wheelchair assist level: Supervision/Verbal cueing Max wheelchair distance: 100 ft    Wheelchair 50 feet with 2 turns activity    Assist    Wheelchair 50 feet with 2 turns activity did not occur: Safety/medical concerns   Assist Level: Supervision/Verbal cueing   Wheelchair 150 feet activity     Assist Wheelchair 150 feet activity did not occur: Safety/medical concerns           Medical Problem List and Plan: 1.   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 DVT Prophylaxis/Anticoagulation: Pharmaceutical: Lovenox 3. Pain Management: Tylenol 4. Mood: We will monitor, may need neuropsych consult, trazodone for sleep- mood improving  5. Neuropsych: This patient is capable of making decisions on her own behalf. 6. Skin/Wound Care: No areas identified we will continue to monitor 7. Fluids/Electrolytes/Nutrition: Recheck C met, monitor I's and O's 8.  COPD Dulera inhaler 2 puffs twice a day, Singulair 10 mg at bedtime No wheezing     9.  History of hypertension on HCTZ and verapamil  Vitals:   06/27/18 2304 06/28/18 0556  BP:  (!) 140/97  Pulse:  82  Resp:  17  Temp:  98.1 F (36.7 C)  SpO2: 98% 100%  Mild elevation this am will resume low dose HCTZ  10.  Mild leukocytosis , odondogenic infxn on Augmentin 14d per DDS  afebrile cont to monitor, D.C. On 11/13 11. Mild anemia, no obvious sign of blood loss ,  stool guaic Neg LOS: 11 days A FACE TO FACE EVALUATION WAS PERFORMED  Judy Boyd 06/28/2018, 7:33 AM

## 2018-06-28 NOTE — Plan of Care (Signed)
  Problem: Consults Goal: RH STROKE PATIENT EDUCATION Description See Patient Education module for education specifics  Outcome: Progressing   Problem: RH BLADDER ELIMINATION Goal: RH STG MANAGE BLADDER WITH ASSISTANCE Description STG Manage Bladder With min. Assistance  Outcome: Progressing Flowsheets (Taken 06/28/2018 1337) STG: Pt will manage bladder with assistance: 4-Minimal assistance   Problem: RH SAFETY Goal: RH STG ADHERE TO SAFETY PRECAUTIONS W/ASSISTANCE/DEVICE Description STG Adhere to Safety Precautions With min. Assistance/Device.  Outcome: Progressing Flowsheets (Taken 06/28/2018 1337) STG:Pt will adhere to safety precautions with assistance/device: 4-Minimal assistance Goal: RH STG DECREASED RISK OF FALL WITH ASSISTANCE Description STG Decreased Risk of Fall With Mod.Assistance.  Outcome: Progressing Flowsheets (Taken 06/28/2018 1337) [id number] risk of fall  with assistance/device: 6-Modified independent   Problem: RH PAIN MANAGEMENT Goal: RH STG PAIN MANAGED AT OR BELOW PT'S PAIN GOAL Description Less than 3.  Outcome: Progressing   Problem: RH KNOWLEDGE DEFICIT Goal: RH STG INCREASE KNOWLEDGE OF HYPERTENSION Description With min. assisst.  Outcome: Progressing Goal: RH STG INCREASE KNOWLEDGE OF STROKE PROPHYLAXIS Description With min. assist.  Outcome: Progressing

## 2018-06-29 ENCOUNTER — Inpatient Hospital Stay (HOSPITAL_COMMUNITY): Payer: Medicaid Other | Admitting: Occupational Therapy

## 2018-06-29 ENCOUNTER — Inpatient Hospital Stay (HOSPITAL_COMMUNITY): Payer: Medicaid Other | Admitting: Physical Therapy

## 2018-06-29 ENCOUNTER — Inpatient Hospital Stay (HOSPITAL_COMMUNITY): Payer: Medicaid Other

## 2018-06-29 NOTE — Progress Notes (Signed)
Occupational Therapy Session Note  Patient Details  Name: Judy Boyd MRN: [id number] Date of Birth: September 18, 1960  Today's Date: 06/29/2018 OT Individual Time: 4259-5638 OT Individual Time Calculation (min): 40 min    Short Term Goals: Week 2:  OT Short Term Goal 1 (Week 2): STG = LTGs due to remaining LOS  Skilled Therapeutic Interventions/Progress Updates:    Treatment session with focus on RUE NMR.  Pt received upright in w/c agreeable to therapy.  Engaged in [place] with reaching to incorporate shoulder abduction, elbow flexion/extension, and wrist extension and finger flexion/extension to reach and grasp large connect four pieces.  Therapist providing tactile cues/support at elbow and wrist to minimize effects of gravity to allow pt to focus on ROM and grasping.  Pt demonstrating improved finger extension throughout session with ability to release grasp on items.  Transferred to therapy mat with min guard.  Engaged in [place] in supine with focus on shoulder flexion and elbow extension with use of bimanual support on therapy ball to facilitate increased shoulder flexion and elbow flexion/extension.  Therapist provided support at elbow to further facilitate full ROM.  Returned to sitting at edge of mat with supervision and then min guard transfer back to w/c.  Pt left upright in w/c with all needs in reach.  Therapy Documentation Precautions:  Precautions Precautions: Fall Precaution Comments: R hemi Restrictions Weight Bearing Restrictions: No Other Position/Activity Restrictions: R knee instability with weight bearing Vital Signs: Therapy Vitals Temp: 98.4 F (36.9 C) Pulse Rate: (!) 101 BP: 124/81 Patient Position (if appropriate): Sitting Oxygen Therapy SpO2: 95 % O2 Device: Room Air Pain:  Pt with no c/o pain   Therapy/Group: Individual Therapy  Simonne Come 06/29/2018, 4:01 PM

## 2018-06-29 NOTE — Progress Notes (Signed)
Social Work Patient ID: Judy Boyd, female   DOB: [birth year], 57 y.o.   MRN: [id number] Spoke with daughter to arrange family education for Thursday, she will be here at 10:00 so will schedule with team.

## 2018-06-29 NOTE — Progress Notes (Signed)
Occupational Therapy Session Note  Patient Details  Name: Judy Boyd MRN: [id number] Date of Birth: 1961/05/10  Today's Date: 06/29/2018 OT Individual Time: 0803-0900 OT Individual Time Calculation (min): 57 min    Short Term Goals: Week 2:  OT Short Term Goal 1 (Week 2): STG = LTGs due to remaining LOS  Skilled Therapeutic Interventions/Progress Updates:    Treatment session with focus on dynamic standing balance and functional use of RUE during self-care tasks of bathing and dressing.  Pt declined bathing at shower level this session. Completed dressing from EOB with increased time when donning bra and shirt but no physical assistance.  Completed LB dressing at sit > stand level with close supervision when standing, but no physical assistance this session.  Pt donned shoes and AFO with increased time.  Ambulated to sink with RW and hand orthosis with min guard.  Grooming tasks completed in standing with intermittent tactile cues for balance when bending.  Hand over hand assist to utilize RUE as stabilizer and gross assist during grooming tasks.  Engaged in [place] in sitting at table in therapy gym with focus on shoulder flexion and elbow extension to increase functional reach as well as grasp and pinch strengthening to increase independence with self-care tasks.  Therapist providing tactile support at elbow and wrist to minimize effects of gravity to allow pt to grasp and pinch yellow (least resistance) resistive clothes pins.  Incorporated reaching across midline and abduction to increase functional ROM/reach.  Returned to room and left upright in w/c with all needs in reach.  Therapy Documentation Precautions:  Precautions Precautions: Fall Precaution Comments: R hemi Restrictions Weight Bearing Restrictions: No Other Position/Activity Restrictions: R knee instability with weight bearing General:   Vital Signs: Therapy Vitals Temp: 97.7 F (36.5 C) Pulse Rate: 81 Resp:  18 BP: 134/81 Patient Position (if appropriate): Lying Oxygen Therapy SpO2: 98 % O2 Device: Room Air Pain:  Pt with no c/o pain   Therapy/Group: Individual Therapy  Simonne Come 06/29/2018, 9:32 AM

## 2018-06-29 NOTE — Progress Notes (Addendum)
Physical Therapy Session Note  Patient Details  Name: Judy Boyd MRN: [id number] Date of Birth: Oct 13, 1960  Today's Date: 06/29/2018 PT Individual Time: 1051-1200 PT Individual Time Calculation (min): 69 min   Short Term Goals: Week 2:  PT Short Term Goal 1 (Week 2): =LTG due to ELOS  Skilled Therapeutic Interventions/Progress Updates:  Pt received in w/c & agreeable to tx. No c/o pain reported. Transported pt to gym via w/c dependent assistance for time management. Pt completes sit<>stand with close supervision<>CGA with ability to manage R hand orthosis without assistance; pt ambulates 200 ft with RW, R hand orthosis, R AFO & toe cap with CGA progressing to very close supervision with improving swing through RLE and therapist providing instructional cuing for RW management for increased safety with turning. Pt performed TUG with RW and min assist with average of 34.91 seconds (3 trials: 36.13 seconds, 35.38 seconds, 33.23 seconds after 2 practice trials). Pt ambulates gym<>outside gift shop, negotiating elevator threshold and carpeted surfaces with RW & min assist progressing to intermittent very close supervision with cuing for RW management with turns, and negotiating carpet (pt reports her carpet is thicker at home). Pt requires rest break once at gift shop. Back on unit pt engaged in dynamic balance task, hitting ball with RUE & RLE with task focusing on forced use of RUE/RLE (RUE flexion and abduction, RLE abduction), dynamic balance and endurance training with therapist providing min assist. At end of session pt left in w/c with chair alarm donned, call bell in reach & set up with meal tray.   Addendum: Recreational therapist present for majority of session.  Therapy Documentation Precautions:  Precautions Precautions: Fall Precaution Comments: R hemi Restrictions Weight Bearing Restrictions: No Other Position/Activity Restrictions: R knee instability with weight  bearing    Therapy/Group: Individual Therapy  Waunita Schooner 06/29/2018, 3:05 PM

## 2018-06-29 NOTE — Plan of Care (Signed)
  Problem: Consults Goal: RH STROKE PATIENT EDUCATION Description See Patient Education module for education specifics  Outcome: Progressing   Problem: RH BOWEL ELIMINATION Goal: RH STG MANAGE BOWEL W/MEDICATION W/ASSISTANCE Description STG Manage Bowel with Medication with min.Assistance.  Outcome: Progressing   Problem: RH BLADDER ELIMINATION Goal: RH STG MANAGE BLADDER WITH ASSISTANCE Description STG Manage Bladder With min. Assistance  Outcome: Progressing   Problem: RH SAFETY Goal: RH STG ADHERE TO SAFETY PRECAUTIONS W/ASSISTANCE/DEVICE Description STG Adhere to Safety Precautions With min. Assistance/Device.  Outcome: Progressing Goal: RH STG DECREASED RISK OF FALL WITH ASSISTANCE Description STG Decreased Risk of Fall With Mod.Assistance.  Outcome: Progressing   Problem: RH PAIN MANAGEMENT Goal: RH STG PAIN MANAGED AT OR BELOW PT'S PAIN GOAL Description Less than 3.  Outcome: Progressing   Problem: RH KNOWLEDGE DEFICIT Goal: RH STG INCREASE KNOWLEDGE OF HYPERTENSION Description With min. assisst.  Outcome: Progressing Goal: RH STG INCREASE KNOWLEDGE OF STROKE PROPHYLAXIS Description With min. assist.  Outcome: Progressing

## 2018-06-29 NOTE — Progress Notes (Signed)
[place] PHYSICAL MEDICINE & REHABILITATION PROGRESS NOTE   Subjective/Complaints: Pt asking for earlier d/c  No new issues overnite ROS- neg for CP, SOB, N/V/D Objective:   No results found. No results for input(s): WBC, HGB, HCT, PLT in the last 72 hours. No results for input(s): NA, K, CL, CO2, GLUCOSE, BUN, CREATININE, CALCIUM in the last 72 hours.  Intake/Output Summary (Last 24 hours) at 06/29/2018 0818 Last data filed at 06/28/2018 1300 Gross per 24 hour  Intake 240 ml  Output -  Net 240 ml     Physical Exam: Vital Signs Blood pressure 134/81, pulse 81, temperature 97.7 F (36.5 C), resp. rate 18, height _0  (1.676 m), weight 58.3 kg, SpO2 98 %.  General: No acute distress Mood and affect are appropriate Heart: Regular rate and rhythm no rubs murmurs or extra sounds Lungs: Clear to auscultation, breathing unlabored, no rales or wheezes Abdomen: Positive bowel sounds, soft nontender to palpation, nondistended Extremities: No clubbing, cyanosis, or edema Skin: No evidence of breakdown, no evidence of rash Neurologic: Cranial nerves II through XII intact, motor strength is 5/5 in LEFT   deltoid, bicep, tricep, grip, hip flexor, knee extensors, ankle dorsiflexor and plantar flexor 3- R finger flex/ext, 3- thumb flex Sensory exam normal sensation to light touch and proprioception in bilateral upper and lower extremities Cerebellar exam normal finger to nose to finger as well as heel to shin in bilateral upper and lower extremities Musculoskeletal: Full range of motion in all 4 extremities. No joint swelling    Assessment/Plan: 1. Functional deficits secondary to Left pontine and thalamic infarcts which require 3+ hours per day of interdisciplinary therapy in a comprehensive inpatient rehab setting.  Physiatrist is providing close team supervision and 24 hour management of active medical problems listed below.  Physiatrist and rehab team continue to assess  barriers to discharge/monitor patient progress toward functional and medical goals  Care Tool:  Bathing    Body parts bathed by patient: Right arm, Chest, Abdomen, Front perineal area, Buttocks, Right upper leg, Left upper leg, Right lower leg, Left lower leg, Face, Left arm   Body parts bathed by helper: Left arm     Bathing assist Assist Level: Contact Guard/Touching assist     Upper Body Dressing/Undressing Upper body dressing   What is the patient wearing?: Bra, Pull over shirt    Upper body assist Assist Level: Supervision/Verbal cueing    Lower Body Dressing/Undressing Lower body dressing      What is the patient wearing?: Underwear/pull up, Pants     Lower body assist Assist for lower body dressing: Contact Guard/Touching assist     Toileting Toileting Toileting Activity did not occur (Clothing management and hygiene only): N/A (no void or bm)  Toileting assist Assist for toileting: Minimal Assistance - Patient > 75%     Transfers Chair/bed transfer  Transfers assist     Chair/bed transfer assist level: Contact Guard/Touching assist     Locomotion Ambulation   Ambulation assist      Assist level: Contact Guard/Touching assist Assistive device: Walker-rolling(R AFO, R hand orthosis, R toe cap) Max distance: 200 ft   Walk 10 feet activity   Assist  Walk 10 feet activity did not occur: Safety/medical concerns  Assist level: Contact Guard/Touching assist Assistive device: Walker-rolling   Walk 50 feet activity   Assist Walk 50 feet with 2 turns activity did not occur: Safety/medical concerns  Assist level: Contact Guard/Touching assist Assistive device: Walker-rolling  Walk 150 feet activity   Assist Walk 150 feet activity did not occur: Safety/medical concerns  Assist level: Contact Guard/Touching assist Assistive device: Walker-rolling    Walk 10 feet on uneven surface  activity   Assist Walk 10 feet on uneven surfaces  activity did not occur: Safety/medical concerns         Wheelchair     Assist Will patient use wheelchair at discharge?: (TBD) Type of Wheelchair: Manual    Wheelchair assist level: Supervision/Verbal cueing Max wheelchair distance: 100 ft    Wheelchair 50 feet with 2 turns activity    Assist    Wheelchair 50 feet with 2 turns activity did not occur: Safety/medical concerns   Assist Level: Supervision/Verbal cueing   Wheelchair 150 feet activity     Assist Wheelchair 150 feet activity did not occur: Safety/medical concerns           Medical Problem List and Plan: 1.   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 DVT Prophylaxis/Anticoagulation: Pharmaceutical: Lovenox 3. Pain Management: Tylenol 4. Mood: We will monitor, may need neuropsych consult, trazodone for sleep- mood improving  5. Neuropsych: This patient is capable of making decisions on her own behalf. 6. Skin/Wound Care: No areas identified we will continue to monitor 7. Fluids/Electrolytes/Nutrition: Recheck C met, monitor I's and O's 8.  COPD Dulera inhaler 2 puffs twice a day, Singulair 10 mg at bedtime No wheezing     9.  History of hypertension on HCTZ and verapamil  Vitals:   06/28/18 2126 06/29/18 0557  BP: 135/87 134/81  Pulse: 73 81  Resp: 18 18  Temp: 97.7 F (36.5 C) 97.7 F (36.5 C)  SpO2: 97% 98%  Mild elevation this am will resume low dose HCTZ  10.  Mild leukocytosis , odondogenic infxn on Augmentin 14d per DDS  afebrile cont to monitor, D.C. augmentin  on 11/13 11. Mild anemia, no obvious sign of blood loss ,  stool guaic Neg LOS: 12 days A FACE TO FACE EVALUATION WAS PERFORMED  Charlett Blake 06/29/2018, 8:18 AM

## 2018-06-29 NOTE — Progress Notes (Signed)
Speech Language Pathology Daily Session Note  Patient Details  Name: Judy Boyd MRN: [id number] Date of Birth: March 14, 1961  Today's Date: 06/29/2018 SLP Individual Time: 0900-0930 SLP Individual Time Calculation (min): 30 min  Short Term Goals: Week 2: SLP Short Term Goal 1 (Week 2): Pt will utilize speech intelligibility startegies to increase intelligibility at the conversation level to > 80% with Mod I.   SLP Short Term Goal 2 (Week 2): Pt will self-monitor and self-correct unintelligible speech at the simple conversation level with Min A cues.  SLP Short Term Goal 3 (Week 2): Pt will demonstrate increased anticipatory awareness by identifying safe vs unsafe activites within home environment with supervision cues.   Skilled Therapeutic Interventions:Skilled ST services focused on speech skills. SLP facilitated speech intelligibility skills in conversation pertaining to discharge plans and anticipatory awareness questions, pt demonstrated mod I use of strategies with 90% intelligibility. SLP and pt recorded conversation, on pt's phone, given biofeed pt identified need for increased vocal intensity. SLP educated pt to record voice upon discharge for biofeed and to confirm use of speech intelligibility strategies, pt agreed. Pt was left in room with call bell within reach and chair alarm set. SLP reccomends to continue skilled services.     Pain Pain Assessment Pain Score: 0-No pain  Therapy/Group: Individual Therapy  Undrea Shipes  [facility] 06/29/2018, 4:49 PM

## 2018-06-30 ENCOUNTER — Inpatient Hospital Stay (HOSPITAL_COMMUNITY): Payer: Medicaid Other | Admitting: Physical Therapy

## 2018-06-30 ENCOUNTER — Inpatient Hospital Stay (HOSPITAL_COMMUNITY): Payer: Medicaid Other | Admitting: Speech Pathology

## 2018-06-30 ENCOUNTER — Encounter (HOSPITAL_COMMUNITY): Payer: Medicaid Other | Admitting: Psychology

## 2018-06-30 ENCOUNTER — Inpatient Hospital Stay (HOSPITAL_COMMUNITY): Payer: Medicaid Other | Admitting: Occupational Therapy

## 2018-06-30 DIAGNOSIS — F444 Conversion disorder with motor symptom or deficit: Secondary | ICD-10-CM

## 2018-06-30 LAB — CBC
HCT: 37.8 % (ref 36.0–46.0)
HEMOGLOBIN: 11.9 g/dL — AB (ref 12.0–15.0)
MCH: 28.2 pg (ref 26.0–34.0)
MCHC: 31.5 g/dL (ref 30.0–36.0)
MCV: 89.6 fL (ref 80.0–100.0)
Platelets: 387 10*3/uL (ref 150–400)
RBC: 4.22 MIL/uL (ref 3.87–5.11)
RDW: 11.5 % (ref 11.5–15.5)
WBC: 11.9 10*3/uL — ABNORMAL HIGH (ref 4.0–10.5)
nRBC: 0 % (ref 0.0–0.2)

## 2018-06-30 LAB — BASIC METABOLIC PANEL
ANION GAP: 6 (ref 5–15)
BUN: 21 mg/dL — ABNORMAL HIGH (ref 6–20)
CO2: 24 mmol/L (ref 22–32)
Calcium: 9.7 mg/dL (ref 8.9–10.3)
Chloride: 107 mmol/L (ref 98–111)
Creatinine, Ser: 0.74 mg/dL (ref 0.44–1.00)
GFR calc Af Amer: >60 mL/min (ref >60)
GFR calc non Af Amer: >60 mL/min (ref >60)
GLUCOSE: 94 mg/dL (ref 70–99)
POTASSIUM: 4 mmol/L (ref 3.5–5.1)
Sodium: 137 mmol/L (ref 135–145)

## 2018-06-30 NOTE — Progress Notes (Signed)
Occupational Therapy Session Note  Patient Details  Name: Judy Boyd MRN: [id number] Date of Birth: [birth year]  Today's Date: 06/30/2018 OT Individual Time: 8676-1950 OT Individual Time Calculation (min): 40 min    Short Term Goals: Week 2:  OT Short Term Goal 1 (Week 2): STG = LTGs due to remaining LOS  Skilled Therapeutic Interventions/Progress Updates:    Treatment session with focus on RUE NMR.  Pt received upright in w/c agreeable to therapy session.  Completed stand pivot transfers with min guard throughout session.  Utilized UE Ranger in sitting with focus on increased shoulder flexion and elbow extension in various planes.  Therapist still providing minimal support to decrease gravity while allowing freedom of movement with UE Ranger.  Engaged in reaching to grasp cups and incorporate abduction to place cups on diagonal to further facilitate shoulder ROM and elbow extension.  Pt continues to demonstrate frustration with lack of progress and need for assistance, but reports pleased with progress to this point and looking forward to d/c home Friday.  Therapy Documentation Precautions:  Precautions Precautions: Fall Precaution Comments: R hemi Restrictions Weight Bearing Restrictions: No Other Position/Activity Restrictions: R knee instability with weight bearing General:   Vital Signs: Therapy Vitals Temp: 99.1 F (37.3 C) Temp Source: Oral Pulse Rate: (!) 102 Resp: 16 BP: 131/84 Patient Position (if appropriate): Sitting Oxygen Therapy SpO2: 96 % O2 Device: Room Air Pain: Pain Assessment Pain Scale: 0-10 Pain Score: 0-No pain   Therapy/Group: Individual Therapy  Simonne Come 06/30/2018, 9:17 PM

## 2018-06-30 NOTE — Progress Notes (Signed)
[place] PHYSICAL MEDICINE & REHABILITATION PROGRESS NOTE   Subjective/Complaints: Pt complains of cramping in her legs. Didn't take anything for them. Otherwise making progress.   ROS: Patient denies fever, rash, sore throat, blurred vision, nausea, vomiting, diarrhea, cough, shortness of breath or chest pain,   headache, or mood change.    Objective:   No results found. Recent Labs    06/30/18 0445  WBC 11.9*  HGB 11.9*  HCT 37.8  PLT 387   Recent Labs    06/30/18 0445  NA 137  K 4.0  CL 107  CO2 24  GLUCOSE 94  BUN 21*  CREATININE 0.74  CALCIUM 9.7    Intake/Output Summary (Last 24 hours) at 06/30/2018 1019 Last data filed at 06/30/2018 0900 Gross per 24 hour  Intake 462 ml  Output -  Net 462 ml     Physical Exam: Vital Signs Blood pressure 106/76, pulse 86, temperature 98.1 F (36.7 C), resp. rate 18, height 5\' 6"  (1.676 m), weight 58.3 kg, SpO2 98 %.  Constitutional: No distress . Vital signs reviewed. HEENT: EOMI, oral membranes moist Neck: supple Cardiovascular: RRR without murmur. No JVD    Respiratory: CTA Bilaterally without wheezes or rales. Normal effort    GI: BS +, non-tender, non-distended  Skin: No evidence of breakdown, no evidence of rash Neurologic: Cranial nerves II through XII intact, motor strength is 5/5 in LEFT   deltoid, bicep, tricep, grip, hip flexor, knee extensors, ankle dorsiflexor and plantar flexor 3 to 3+ R finger flex/ext, 3+ thumb flex, 2-3/5 proximally Sensory exam normal sensation to light touch and proprioception in bilateral upper and lower extremities Musculoskeletal: Full range of motion in all 4 extremities. No joint swelling Psych: pleasant and appropriate    Assessment/Plan: 1. Functional deficits secondary to Left pontine and thalamic infarcts which require 3+ hours per day of interdisciplinary therapy in a comprehensive inpatient rehab setting.  Physiatrist is providing close team supervision and 24 hour  management of active medical problems listed below.  Physiatrist and rehab team continue to assess barriers to discharge/monitor patient progress toward functional and medical goals  Care Tool:  Bathing    Body parts bathed by patient: Right arm, Chest, Abdomen, Front perineal area, Buttocks, Right upper leg, Left upper leg, Right lower leg, Left lower leg, Face, Left arm   Body parts bathed by helper: Left arm     Bathing assist Assist Level: Contact Guard/Touching assist     Upper Body Dressing/Undressing Upper body dressing   What is the patient wearing?: Bra, Pull over shirt    Upper body assist Assist Level: Supervision/Verbal cueing    Lower Body Dressing/Undressing Lower body dressing      What is the patient wearing?: Underwear/pull up, Pants     Lower body assist Assist for lower body dressing: Supervision/Verbal cueing     Toileting Toileting Toileting Activity did not occur (Clothing management and hygiene only): N/A (no void or bm)  Toileting assist Assist for toileting: Minimal Assistance - Patient > 75%     Transfers Chair/bed transfer  Transfers assist     Chair/bed transfer assist level: Contact Guard/Touching assist     Locomotion Ambulation   Ambulation assist      Assist level: Supervision/Verbal cueing Assistive device: Walker-rolling(R AFO, R toe cap, R hand orthosis) Max distance: 200 ft    Walk 10 feet activity   Assist  Walk 10 feet activity did not occur: Safety/medical concerns  Assist level: Supervision/Verbal cueing Assistive  device: Walker-rolling(R AFO, R toe cap, R hand orthosis)   Walk 50 feet activity   Assist Walk 50 feet with 2 turns activity did not occur: Safety/medical concerns  Assist level: Supervision/Verbal cueing Assistive device: Walker-rolling(R AFO, R toe cap, R hand orthosis)    Walk 150 feet activity   Assist Walk 150 feet activity did not occur: Safety/medical concerns  Assist level:  Supervision/Verbal cueing Assistive device: Walker-rolling(R AFO, R toe cap, R hand orthosis)    Walk 10 feet on uneven surface  activity   Assist Walk 10 feet on uneven surfaces activity did not occur: Safety/medical concerns         Wheelchair     Assist Will patient use wheelchair at discharge?: (TBD) Type of Wheelchair: Manual    Wheelchair assist level: Supervision/Verbal cueing Max wheelchair distance: 100 ft    Wheelchair 50 feet with 2 turns activity    Assist    Wheelchair 50 feet with 2 turns activity did not occur: Safety/medical concerns   Assist Level: Supervision/Verbal cueing   Wheelchair 150 feet activity     Assist Wheelchair 150 feet activity did not occur: Safety/medical concerns           Medical Problem List and Plan: 1.   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 DVT Prophylaxis/Anticoagulation: Pharmaceutical: Lovenox 3. Pain Management: Tylenol  -has flexeril prn for cramps 4. Mood: We will monitor, may need neuropsych consult, trazodone for sleep- mood improving  5. Neuropsych: This patient is capable of making decisions on her own behalf. 6. Skin/Wound Care: No areas identified we will continue to monitor 7. Fluids/Electrolytes/Nutrition: encourage PO 8.  COPD Dulera inhaler 2 puffs twice a day, Singulair 10 mg at bedtime No wheezing      9.  History of hypertension on HCTZ and verapamil  Vitals:   06/30/18 0458 06/30/18 0849  BP: 106/76   Pulse: 86   Resp: 18   Temp: 98.1 F (36.7 C)   SpO2: 99% 98%   -resumed low dose HCTZ, better control  10.  Mild leukocytosis , odondogenic infxn on Augmentin 14d per DDS  afebrile cont to monitor, D.C. augmentin  Today 11/13 11. Mild anemia, no obvious sign of blood loss ,  stool guaic Neg LOS: 13 days A FACE TO [place] 06/30/2018, 10:19 AM

## 2018-06-30 NOTE — Plan of Care (Signed)
  Problem: Consults Goal: RH STROKE PATIENT EDUCATION Description See Patient Education module for education specifics  Outcome: Progressing   Problem: RH BOWEL ELIMINATION Goal: RH STG MANAGE BOWEL W/MEDICATION W/ASSISTANCE Description STG Manage Bowel with Medication with min.Assistance.  Outcome: Progressing Flowsheets (Taken 06/30/2018 1307) STG: Pt will manage bowels with medication with assistance: 6-Modified independent   Problem: RH BLADDER ELIMINATION Goal: RH STG MANAGE BLADDER WITH ASSISTANCE Description STG Manage Bladder With min. Assistance  Outcome: Progressing Flowsheets (Taken 06/30/2018 1307) STG: Pt will manage bladder with assistance: 5-Supervision/set up   Problem: RH SAFETY Goal: RH STG ADHERE TO SAFETY PRECAUTIONS W/ASSISTANCE/DEVICE Description STG Adhere to Safety Precautions With min. Assistance/Device.  Outcome: Progressing Flowsheets (Taken 06/30/2018 1307) STG:Pt will adhere to safety precautions with assistance/device: 5-Supervision/set up Goal: RH STG DECREASED RISK OF FALL WITH ASSISTANCE Description STG Decreased Risk of Fall With Mod.Assistance.  Outcome: Progressing Flowsheets (Taken 06/30/2018 1307) MOQ:[id number] risk of fall  with assistance/device: 5-Supervison/set up   Problem: RH PAIN MANAGEMENT Goal: RH STG PAIN MANAGED AT OR BELOW PT'S PAIN GOAL Description Less than 3.  Outcome: Progressing   Problem: RH KNOWLEDGE DEFICIT Goal: RH STG INCREASE KNOWLEDGE OF HYPERTENSION Description With min. assisst.  Outcome: Progressing Goal: RH STG INCREASE KNOWLEDGE OF STROKE PROPHYLAXIS Description With min. assist.  Outcome: Progressing

## 2018-06-30 NOTE — Progress Notes (Signed)
Occupational Therapy Session Note  Patient Details  Name: Judy Boyd MRN: [id number] Date of Birth: [birth year]  Today's Date: 06/30/2018 OT Individual Time: 1601-0932 OT Individual Time Calculation (min): 40 min    Short Term Goals: Week 2:  OT Short Term Goal 1 (Week 2): STG = LTGs due to remaining LOS  Skilled Therapeutic Interventions/Progress Updates:    Treatment session with focus on safety awareness and RUE NMR. Pt received upright in w/c with nursing staff present providing supervision while pt completed dressing and grooming tasks at sink.  Pt declined bathing, stating that she had bathed last night when her daughter was present.  Discussed safety concerns with pt completing transfers and shower with family as they have not been trained by therapy staff to assist with transfers or any other mobility.  Pt continues to demonstrate decreased emergent and anticipatory awareness which continue to warrant safety concerns upon d/c.  Pt's daughter to be present for family education tomorrow and will be trained to assist with transfers and self-care tasks.  Engaged in [place] in [place] and sitting with focus on scapular mobility progressing towards shoulder flexion/extension and elbow flexion/extension.  Used powder board in sidelying to further target scapular mobility to facilitate shoulder flexion.  Therapist providing intermittent tactile cues and facilitation for full ROM.  Pt able to complete scapular protraction against minimal resistance in sidelying, continues to demonstrate decreased scapular retraction.  Pt continues to demonstrate increased finger flexion/extension with ability to grasp and pick up cups, requiring assistance from therapist to transfer cups from place to place with therapist providing support at elbow and wrist to minimize gravity.  Pt returned to w/c stand pivot with min guard.  Pt left upright in w/c with all needs in reach awaiting PT.  Therapy  Documentation Precautions:  Precautions Precautions: Fall Precaution Comments: R hemi Restrictions Weight Bearing Restrictions: No Other Position/Activity Restrictions: R knee instability with weight bearing General:   Vital Signs: Oxygen Therapy SpO2: 98 % O2 Device: Room Air Pain: Pain Assessment Pain Scale: 0-10 Pain Score: 0-No pain   Therapy/Group: Individual Therapy  Simonne Come 06/30/2018, 12:12 PM

## 2018-06-30 NOTE — Patient Care Conference (Signed)
Inpatient RehabilitationTeam Conference and Plan of Care Update Date: 06/30/2018   Time: 10:45 AM    Patient Name: Judy Boyd      Medical Record Number: [id number]  Date of Birth: 01-30-1961 Sex: Female         Room/Bed: 4W20C/4W20C-01 Payor Info: Payor: MEDICAID [place] / Plan: MEDICAID [place] ACCESS / Product Type: *No Product type* /    Admitting Diagnosis: Multiple L infracts  Admit Date/Time:  06/17/2018  4:56 PM Admission Comments: No comment available   Primary Diagnosis:  <principal problem not specified> Principal Problem: <principal problem not specified>  Patient Active Problem List   Diagnosis Date Noted  . Hemiparesis affecting right side as late effect of stroke ([place]) 06/17/2018  . Stroke (cerebrum) ([place]) 06/17/2018  . H/O medication noncompliance   . Chronic pain syndrome   . Leukocytosis   . Acute ischemic stroke ([place]) 06/13/2018  . Tobacco abuse 06/13/2018  . Lacunar stroke ([place]) 06/12/2018  . Hypertension   . COPD (chronic obstructive pulmonary disease) ([place])   . GERD (gastroesophageal reflux disease) 04/06/2012  . Abdominal pain 04/06/2012  . Nausea & vomiting 04/06/2012  . Melena 04/06/2012    Expected Discharge Date: Expected Discharge Date: 07/02/18  Team Members Present: Physician leading conference: Dr. Delice Lesch Social Worker Present: Ovidio Kin, LCSW Nurse Present: Rayetta Pigg, RN PT Present: Lavone Nian, PT OT Present: Simonne Come, OT SLP Present: Stormy Fabian, SLP PPS Coordinator present : Daiva Nakayama, RN, CRRN     Current Status/Progress Goal Weekly Team Focus  Medical   improving engagement of right arm. abx completed today, afebrile. bp remains elevated  optimize nutrition and blood pressure  see medical progress notes   Bowel/Bladder   Continent of Bladder and Bowel; LBM 06-29-18  continent of bowel and bladder  Assist with toileting q 2hrs and as needed   Swallow/Nutrition/ Hydration             ADL's   conctact guard stand pivot and ambulatory transfers, supervision bathing and dressing at sit > stand level.  Improving RUE ROM with shoulder and elbow movements against gravity  Supervision  ADL retraining, dynamic standing balance, ambulatory transfers, RUE NMR   Mobility   CGA assist gait with RW, R hand orthosis, R AFO & toe cap, min assist stairs with 1 rail, supervision for standing balance  supervision overall  gait, safety awareness, balance, transfers, R NMR & strengthening, pt education, stair negotiation, d/c planning   Communication   Supervision - Mod I for speech intelligibility   Supervision-Mod I  intelligibility strategies, self-awareness/self-correction   Safety/Cognition/ Behavioral Observations            Pain   No complaints of pain  Pain < or =2  Assess pain q shift amd as needed   Skin   No skin issuses  Remain free of skin breakdown  Assess skin q shift and as needed      *See Care Plan and progress notes for long and short-term goals.     Barriers to Discharge  Current Status/Progress Possible Resolutions Date Resolved   Physician    Medical stability        see medical progress notes      Nursing                  PT  Decreased caregiver support;Lack of/limited family support  pt lives alone but reports her daughter plans to come stay with her  OT                  SLP                SW                Discharge Planning/Teaching Needs:  Daughter to be here tomorrow for family education. Making good progress and ready to go home. Daughter aware will need supervision level at DC      Team Discussion:  Making good progress in therapies. Family education tomorrow with daughter. Pt's safety recall better. Speech work on word finding and intelligibility. AFO received. Prepare for DC Friday. Working on PCP to follow upon DC  Revisions to Treatment Plan:  DC 11/15    Continued Need for Acute Rehabilitation Level of Care: The patient requires  daily medical management by a physician with specialized training in physical medicine and rehabilitation for the following conditions: Daily direction of a multidisciplinary physical rehabilitation program to ensure safe treatment while eliciting the highest outcome that is of practical value to the patient.: Yes Daily medical management of patient stability for increased activity during participation in an intensive rehabilitation regime.: Yes Daily analysis of laboratory values and/or radiology reports with any subsequent need for medication adjustment of medical intervention for : Neurological problems;Blood pressure problems   I attest that I was present, lead the team conference, and concur with the assessment and plan of the team.   Elease Hashimoto 06/30/2018, 1:27 PM

## 2018-06-30 NOTE — Progress Notes (Signed)
Social Work Patient ID: Judy Boyd, female   DOB: 24-Dec-1960, 57 y.o.   MRN: [id number] Met with pt to discuss team conference plan for Friday and aware daughter to be here tomorrow for education. Have ordered DME and follow up arranged for home health. See daughter tomorrow.

## 2018-06-30 NOTE — Consult Note (Signed)
Neuropsychological Consultation   Patient:   Judy Boyd   DOB:   October 06, 1960  MR Number:  [id number]  Location:  [facility] [place] [facility] [place] [id number] [place] [address] Dept: [place]: [phone]           Date of Service:   07/17/2018  Start Time:   11 AM End Time:   12 PM  Provider/Observer:  Ilean Skill, Psy.D.       Clinical Neuropsychologist       Billing Code/Service: [phone] 4 Units  Chief Complaint:    Judy Boyd is a 57 year old female with a history of COPD, hypertension, and tobacco use.  The patient was admitted on 06/12/2018 with a 2-day history of right sided facial droop, dysarthria and right-sided weakness.  CT of head was done revealing low attenuation in left caudate likely due to remote infarct.  On 06/15/2018 the patient developed increasing right-sided weakness that was felt to be due to an extension of the stroke.  The patient was transferred to [facility] for further work-up and MRI of brain revealed subacute lacunar infarct as well as left thalamus with moderate advanced deep white matter disease and acute left pontine infarct with edema.  The patient had a aneurysm identified.  At that time, it was felt that her worsening was due to mass/edema effects.  Her stroke felt to be due to small vessel disease.  Reason for Service:  The patient was referred for neuropsychological consultation due to adjustment and coping issues.  The patient has struggled at times with the motor deficits and the expressive language deficits.  She has had a great deal of frustration associated with her extended hospital stay and has had times of adjustment and coping difficulties.  Below is the HPI for the current admission  Judy Boyd is a RH female with history of COPD, HTN, tobacco use who wasadmitted on 10/26-20 19 with 2-day history of right facial droop,  dysarthria and right-sided weakness. CT of head done revealing low attenuation in left caudate head likely due to remote infarct. CTA head neck was negative for significant stenosis and incidental findings of soft tissue inflammation of right mandible as well as extensive dental disease and right thyroid nodule noted. She was started on aspirin for secondary stroke prevention as well as Augmentin for odontogenic infection. 2D echo done showed revealing EF of 60 to 65% with moderate LVH and no thrombus or PFO. On 06/15/2018 patient developed increasing right-sided weakness felt to be due to extension of stroke. 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 Neurology felt that patient with recent worsening due to mass/edema effect. Stroke was felt to be due to small vessel disease and risk factor modification recommended. Plavix added to aspirin for secondary stroke prevention with recommendations to continue Plavix for 3 weeks followed by aspirin alone. Therapy evaluations done revealing functional deficits and CIR recommended for follow-up therapy  Current Status:  Today, the patient displayed dysarthria and difficulty with clear enunciation of words and word finding issues.  However, review of records show that this is a significant improvement over the past week or so.  The patient reports that she has been improving her motor  function as well.  However, she does acknowledge that she has been quite agitated in a number of issues that she complained about.  Most of usually do with her frustration impulse control issues and need to have things done right away versus complexities and structure of a hospital setting.    Behavioral Observation: Judy Boyd   presents as a 57 y.o.-year-old Right African American Female who appeared her stated age. her dress was Appropriate and she was Well Groomed and her manners were Appropriate to the situation.  her participation was indicative of Appropriate and Redirectable behaviors.  There were any physical disabilities noted.  she displayed an appropriate level of cooperation and motivation.     Interactions:    Active Appropriate and Redirectable  Attention:   abnormal and attention span appeared shorter than expected for age  Memory:   abnormal; remote memory intact, recent memory impaired  Visuo-spatial:  not examined  Speech (Volume):  normal  Speech:   non-fluent aphasia; garbled  Thought Process:  Coherent and Relevant  Though Content:  WNL; not suicidal and not homicidal  Orientation:   person, place, time/date and situation  Judgment:   Fair  Planning:   Fair  Affect:    Irritable  Mood:    Irritable  Insight:   Fair  Intelligence:   normal  Medical History:   Past Medical History:  Diagnosis Date  . Anxiety   . Arthritis   . Asthma   . Bowel obstruction ([place])   . Chronic pain   . COPD (chronic obstructive pulmonary disease) ([place])   . Depression   . GERD (gastroesophageal reflux disease)   . Hypertension          Abuse/Trauma History: The patient did not go into any detail regarding possible past abuse or trauma experiences.  It was not the focus of the session today.  Psychiatric History:  The patient does have a prior history of anxiety and depression as well as chronic pain symptoms.  Family Med/Psych History:  Family History  Problem Relation Age of Onset  . Heart disease Mother   . Heart disease Father     Risk of Suicide/Violence: low the patient denies any current suicidal or homicidal ideation.  Impression/DX:  Judy Boyd is a 57 year old female with a history of COPD, hypertension, and tobacco use.  The patient was admitted on 06/12/2018 with a  2-day history of right sided facial droop, dysarthria and right-sided weakness.  CT of head was done revealing low attenuation in left caudate likely due to remote infarct.  On 06/15/2018 the patient developed increasing right-sided weakness that was felt to be due to an extension of the stroke.  The patient was transferred to [facility] for further work-up and MRI of brain revealed subacute lacunar infarct as well as left thalamus with moderate advanced deep white matter disease and acute left pontine infarct with edema.  The patient had a aneurysm identified.  At that time, it was felt that her worsening was due to mass/edema effects.  Her stroke felt to be due to small vessel disease.  Today, the patient displayed dysarthria and difficulty with clear enunciation of words and word finding issues.  However, review of records show that this is a significant improvement over the past week or so.  The patient reports that she has been improving her motor function as well.  However, she does acknowledge that she has been quite agitated in a number of issues that  she complained about.  Most of usually do with her frustration impulse control issues and need to have things done right away versus complexities and structure of a hospital setting.  Diagnosis:    Right side hemiplegia due to left hemispheric stroke as well as expressive language deficits.       Electronically Signed   _______________________ Ilean Skill, Psy.D.

## 2018-06-30 NOTE — Progress Notes (Signed)
Speech Language Pathology Daily Session Note  Patient Details  Name: Judy Boyd MRN: [id number] Date of Birth: [birth year]  Today's Date: 06/30/2018 SLP Individual Time: 1000-1030 SLP Individual Time Calculation (min): 30 min  Short Term Goals: Week 2: SLP Short Term Goal 1 (Week 2): Pt will utilize speech intelligibility startegies to increase intelligibility at the conversation level to > 80% with Mod I.   SLP Short Term Goal 2 (Week 2): Pt will self-monitor and self-correct unintelligible speech at the simple conversation level with Min A cues.  SLP Short Term Goal 3 (Week 2): Pt will demonstrate increased anticipatory awareness by identifying safe vs unsafe activites within home environment with supervision cues.   Skilled Therapeutic Interventions:  Skilled treatment session focused on communication goals and education on pt safety. SLP received pt upright in wheelchair with pt's chair alarm attached to wall. Pt spoke with animation about being "tied down in one place." Pt able to express with > 95% intelligibility and Mod I use of speech intelligibility strategies. SLP addressed and lugged chair alarm into mobile box and placed in back of wheelchair. Pt with decreased safety awareness as she states she needs to use the bathroom and can transfer herself without assistance. SLP aided in safe transfer with education provided. Pt is reluctant to accept. Pt left upright in wheelchair, seat alarm on and all needs within reach. Continue per current plan of care.      Pain Pain Assessment Pain Scale: 0-10 Pain Score: 0-No pain  Therapy/Group: Individual Therapy  Aiman Sonn 06/30/2018, 1:58 PM

## 2018-06-30 NOTE — Progress Notes (Addendum)
Physical Therapy Session Note  Patient Details  Name: Judy Boyd MRN: [id number] Date of Birth: 1961/06/01  Today's Date: 06/30/2018 PT Individual Time: 0847-1001 PT Individual Time Calculation (min): 74 min   Short Term Goals: Week 2:  PT Short Term Goal 1 (Week 2): =LTG due to ELOS  Skilled Therapeutic Interventions/Progress Updates:  Pt received in w/c & agreeable to tx, no c/o pain reported. Pt reports frustration over nursing staff not arriving quickly enough to assist her to the bathroom before she voids due to urgency with PT providing therapeutic listening. Transported pt to gym via w/c dependent assist and pt completes sit<>stand transfers with supervision, manages R hand orthosis without assistance, and ambulates 200 ft + ~150 ft with RW, R toe cap & R GRAFO with supervision overall with slightly decreased balance with turning. Pt negotiates thicker carpeted rug in CVA office to simulate carpeted home environment with therapist educating her on increased safety with decreased step length when negotiating thicker carpet. Back in gym pt ambulates length of gym twice with supervision, negotiates 4 steps with L ascending rail with supervision and max cuing for compensatory pattern, then weaves between cones with task focusing on shorter step length BLE for increased safety with turns with pt able to do so with supervision. In dayroom pt utilized [employer] Limits of stability with max assist with task focusing on weight shifting with pt demonstrating greatest difficulty anterior/R. At rail in hallway pt performed side stepping with steady assist for balance with cuing for increased R foot clearance with task focusing on RLE strengthening & NMR, with pt able to maintain BUE on rail for support which is an improvement in her RUE. At end of session pt left sitting in w/c with chair alarm donned & call bell in reach.  Educated pt on f/u recommendations of using R AFO for all mobility, use of RW,  getting pt a transport w/c for community mobility, and f/u HHPT.  Therapy Documentation Precautions:  Precautions Precautions: Fall Precaution Comments: R hemi Restrictions Weight Bearing Restrictions: No Other Position/Activity Restrictions: R knee instability with weight bearing    Therapy/Group: Individual Therapy  Waunita Schooner 06/30/2018, 10:03 AM

## 2018-07-01 ENCOUNTER — Inpatient Hospital Stay (HOSPITAL_COMMUNITY): Payer: Medicaid Other | Admitting: Speech Pathology

## 2018-07-01 ENCOUNTER — Inpatient Hospital Stay (HOSPITAL_COMMUNITY): Payer: Medicaid Other

## 2018-07-01 ENCOUNTER — Encounter (HOSPITAL_COMMUNITY): Payer: Medicaid Other | Admitting: Occupational Therapy

## 2018-07-01 MED ORDER — ACETAMINOPHEN 325 MG PO TABS: 325.0000 mg | ORAL_TABLET | ORAL | Status: DC | PRN

## 2018-07-01 MED ORDER — HYDROCHLOROTHIAZIDE 12.5 MG PO TABS: 12.5000 mg | ORAL_TABLET | Freq: Every day | ORAL | 0 refills | Status: DC

## 2018-07-01 MED ORDER — POLYETHYLENE GLYCOL 3350 17 G PO PACK: 17.0000 g | PACK | Freq: Every day | ORAL | 0 refills | Status: DC

## 2018-07-01 MED ORDER — CLOPIDOGREL BISULFATE 75 MG PO TABS: 75.0000 mg | ORAL_TABLET | Freq: Every day | ORAL | 0 refills | Status: DC

## 2018-07-01 MED ORDER — ATORVASTATIN CALCIUM 40 MG PO TABS: 40.0000 mg | ORAL_TABLET | Freq: Every day | ORAL | 0 refills | Status: DC

## 2018-07-01 MED ORDER — NICOTINE 14 MG/24HR TD PT24: 14.0000 mg | MEDICATED_PATCH | Freq: Every day | TRANSDERMAL | 0 refills | Status: DC

## 2018-07-01 NOTE — Discharge Instructions (Signed)
Inpatient Rehab Discharge Instructions  Judy Boyd Discharge date and time: 07/01/18   Activities/Precautions/ Functional Status: Activity: no lifting, driving, or strenuous exercise for till cleared by MD Diet: cardiac diet Wound Care: none needed   Functional status:  ___ No restrictions     ___ Walk up steps independently _X__ 24/7 supervision/assistance   ___ Walk up steps with assistance ___ Intermittent supervision/assistance  ___ Bathe/dress independently ___ Walk with walker     _X__ Bathe/dress with supervision ___ Walk Independently    ___ Shower independently ___ Walk with assistance    ___ Shower with assistance _X__ No alcohol     ___ Return to work/school ________   COMMUNITY REFERRALS UPON DISCHARGE:    Home Health:   PT, OT, SP, [place]   Date of last service:07/01/2018  Medical Equipment/Items Ordered:TRANSPORT CHAIR, [place], 3 IN 1, [place]   [phone] Other:PCS REFERRAL MADE DAUGHTER TO FOLLOW UP WITH  GENERAL COMMUNITY RESOURCES FOR PATIENT/FAMILY: Support Groups:[facility] SUPPORT GROUP THIRD Monday @ 1:30 PM AT [place] [address] [place]  TABITHA JACKSON-[phone] IF QUESTIONS   Special instructions: 1. Continue taking aspirin daily for stroke prevention. Plavix to continue for one additional week.  2. Needs to follow up with MD to work up thyroid nodule.   STROKE/TIA DISCHARGE INSTRUCTIONS SMOKING Cigarette smoking nearly doubles your risk of having a stroke & is the single most alterable risk factor  If you smoke or have smoked in the last 12 months, you are advised to quit smoking for your health.  Most of the excess cardiovascular risk related to smoking disappears within a year of stopping.  Ask you doctor about anti-smoking medications  [place] Quit Line: 1-800-QUIT NOW  Free Smoking Cessation Classes [phone]  CHOLESTEROL Know  your levels; limit fat & cholesterol in your diet  Lipid Panel     Component Value Date/Time   CHOL 222 (H) 06/13/2018 0613   TRIG 131 06/13/2018 0613   HDL 53 06/13/2018 0613   CHOLHDL 4.2 06/13/2018 0613   VLDL 26 06/13/2018 0613   LDLCALC 143 (H) 06/13/2018 0613      Many patients benefit from treatment even if their cholesterol is at goal.  Goal: Total Cholesterol (CHOL) less than 160  Goal:  Triglycerides (TRIG) less than 150  Goal:  HDL greater than 40  Goal:  LDL (LDLCALC) less than 100   BLOOD PRESSURE American Stroke Association blood pressure target is less that 120/80 mm/Hg  Your discharge blood pressure is:  BP: 112/79  Monitor your blood pressure  Limit your salt and alcohol intake  Many individuals will require more than one medication for high blood pressure  DIABETES (A1c is a blood sugar average for last 3 months) Goal HGBA1c is under 7% (HBGA1c is blood sugar average for last 3 months)  Diabetes: No known diagnosis of diabetes    Lab Results  Component Value Date   HGBA1C 4.6 (L) 06/13/2018     Your HGBA1c can be lowered with medications, healthy diet, and exercise.  Check your blood sugar as directed by your physician  Call your physician if you experience unexplained or low blood sugars.  PHYSICAL ACTIVITY/REHABILITATION Goal is 30 minutes at least 4 days per week  Activity: No driving, Therapies: see above Return to work: N/A  Activity decreases your risk of heart attack and stroke and makes your heart stronger.  It helps control your  weight and blood pressure; helps you relax and can improve your mood.  Participate in a regular exercise program.  Talk with your doctor about the best form of exercise for you (dancing, walking, swimming, cycling).  DIET/WEIGHT Goal is to maintain a healthy weight  Your discharge diet is:  Diet Order            Diet Heart Room service appropriate? Yes; Fluid consistency: Thin  Diet effective now               liquids Your height is:  Height: 5\' 6"  (167.6 cm) Your current weight is: Weight: 58.3 kg Your Body Mass Index (BMI) is:  BMI (Calculated): 20.75  Following the type of diet specifically designed for you will help prevent another stroke.  You are at goal weight.   Your goal Body Mass Index (BMI) is 19-24.  Healthy food habits can help reduce 3 risk factors for stroke:  High cholesterol, hypertension, and excess weight.  RESOURCES Stroke/Support Group:  Call [phone]   STROKE EDUCATION PROVIDED/REVIEWED AND GIVEN TO PATIENT Stroke warning signs and symptoms How to activate emergency medical system (call 911). Medications prescribed at discharge. Need for follow-up after discharge. Personal risk factors for stroke. Pneumonia vaccine given:  Flu vaccine given:  My questions have been answered, the writing is legible, and I understand these instructions.  I will adhere to these goals & educational materials that have been provided to me after my discharge from the hospital.     My questions have been answered and I understand these instructions. I will adhere to these goals and the provided educational materials after my discharge from the hospital.  Patient/Caregiver Signature _______________________________ Date __________  Clinician Signature _______________________________________ Date __________  Please bring this form and your medication list with you to all your follow-up doctor's appointments.

## 2018-07-01 NOTE — Progress Notes (Signed)
Recreational Therapy Discharge Summary Patient Details  Name: Judy Boyd MRN: [id number] Date of Birth: 08/16/1961 Today's Date: 07/01/2018 Comments on progress toward goals: Pt has made good progress during LOS and is ready for discharge home today with daughter to provide/coordinate 24 hour supervision/assitance.  TR sessions on activity tolerance, dynamic standing balance, activity analysis with potential modifications.  GOALS met.  Reasons for discharge: discharge from hospital  Patient/family agrees with progress made and goals achieved: Yes  SIMPSON,LISA 07/01/2018, 12:37 PM     

## 2018-07-01 NOTE — Progress Notes (Signed)
Occupational Therapy Session Note  Patient Details  Name: Judy Boyd MRN: [id number] Date of Birth: [birth year]  Today's Date: 07/01/2018 OT Individual Time: 1000-1048 OT Individual Time Calculation (min): 48 min    Short Term Goals: Week 2:  OT Short Term Goal 1 (Week 2): STG = LTGs due to remaining LOS  Skilled Therapeutic Interventions/Progress Updates:    Completed ADL retraining at overall supervision level.  Pt completed stand pivot transfer w/c > tub bench in room shower with close supervision.  Completed bathing at sit > stand level with supervision, demonstrating improved LLE stability and dynamic standing balance.  Dressing completed with increased time but ability to don bra and shoes with AFO without assistance this session.  Pt's daughter arrived after shower/during dressing for family education.  Discussed recommendation for 24 hr supervision and close supervision when ambulating with RW.  Discussed recommendation to wear AFO with shoes during all ambulation, therefore stand pivot transfers from w/c when not wearing shoe with AFO.  Pt and daughter report understanding.  Completed tub/shower and toilet transfers in ADL apt with RW with Rt hand orthosis.  Therapist provided supervision with ambulation and transfers while educating pt's daughter on safety concerns.  Pt's daughter able to provide supervision with ambulation and bathroom transfers with initial min cues to stay close, especially during turns.  Pt returned to room and provided with additional HEP with focus on RUE fine motor coordination.  Pt passed off to SLP.  Therapy Documentation Precautions:  Precautions Precautions: Fall Precaution Comments: R hemi Restrictions Weight Bearing Restrictions: No Other Position/Activity Restrictions: R knee instability with weight bearing General:   Vital Signs: Oxygen Therapy SpO2: 99 % O2 Device: Room Air Pain: Pain Assessment Pain Scale: 0-10 Pain Score: 0-No  pain ADL: ADL Eating: Modified independent Where Assessed-Eating: Wheelchair Grooming: Supervision/safety Where Assessed-Grooming: Standing at sink Upper Body Bathing: Supervision/safety Where Assessed-Upper Body Bathing: Shower Lower Body Bathing: Supervision/safety Where Assessed-Lower Body Bathing: Shower Upper Body Dressing: Setup Where Assessed-Upper Body Dressing: Wheelchair, Sitting at sink Lower Body Dressing: Supervision/safety Where Assessed-Lower Body Dressing: Wheelchair, Sitting at sink, Standing at sink Toileting: Supervision/safety Where Assessed-Toileting: Glass blower/designer: Close supervision Toilet Transfer Method: Counselling psychologist: Grab bars(RW) Tub/Shower Transfer: Close supervison Clinical cytogeneticist Method: Optometrist: Facilities manager: Close supervision Social research officer, government Method: Radiographer, therapeutic: Radio broadcast assistant, Grab bars   Therapy/Group: Individual Therapy  Simonne Come 07/01/2018, 11:07 AM

## 2018-07-01 NOTE — Progress Notes (Signed)
Patient and family received discharge instructions from Algis Liming, PA-C with verbal understanding. Patient to be discharged to home with family and patient belongings.

## 2018-07-01 NOTE — Progress Notes (Signed)
Social Work Patient ID: Judy Boyd, female   DOB: 02-09-1961, 57 y.o.   MRN: [id number] Daughter here for education and both requesting to go home today after therapies instead of tomorrow. MD and team in agreement with this plan. Have contacted [facility] to get equipment here today and follow up arranged via [facility]. Pt has her Medicaid which will cover her prescriptions. Daughter has information regarding the [facility] in [place] to set her up with a PCP once intake form completed and taken back to them, which daughter has. Work toward discharge this afternoon.

## 2018-07-01 NOTE — Progress Notes (Signed)
Social Work  Discharge Note  The overall goal for the admission was met for:   Discharge location: Yes-HOME WITH DAUGHTER WHO CAN PROVIDE SUPERVISION LEVEL  Length of Stay: Yes-14 DAYS  Discharge activity level: Yes-SUPERVISION LEVEL  Home/community participation: Yes  Services provided included: MD, RD, PT, OT, SLP, RN, CM, TR, Pharmacy, Neuropsych and SW  Financial Services: Medicaid  Follow-up services arranged: Home Health: [facility]-PT,OT,SP,SW, DME: [facility]-TRANSPORT CHAIR, ROLLING WALKER, 3 IN 1 & TUB BENCH and Patient/Family has no preference for HH/DME agencies  Comments (or additional information):DAUGHTER WAS HERE FOR EDUCATION AND COMFORTABLE WITH DC ACTUALLY WANTED TO GO HOME A DAY EARLIER DUE TO EDUCATION DONE. DAUGHTER WORKING ON [employer] AND PCS REFERRAL MADE.   Patient/Family verbalized understanding of follow-up arrangements: Yes  Individual responsible for coordination of the follow-up plan: SELF & RESHAUNDRA-DAUGHTER  Confirmed correct DME delivered: Elease Hashimoto 07/01/2018    Elease Hashimoto

## 2018-07-01 NOTE — Progress Notes (Signed)
[facility] PHYSICAL MEDICINE & REHABILITATION PROGRESS NOTE   Subjective/Complaints: No new complaints. Anxious for right arm to move more.   ROS: Patient denies fever, rash, sore throat, blurred vision, nausea, vomiting, diarrhea, cough, shortness of breath or chest pain, joint or back pain, headache, or mood change.    Objective:   No results found. Recent Labs    06/30/18 0445  WBC 11.9*  HGB 11.9*  HCT 37.8  PLT 387   Recent Labs    06/30/18 0445  NA 137  K 4.0  CL 107  CO2 24  GLUCOSE 94  BUN 21*  CREATININE 0.74  CALCIUM 9.7    Intake/Output Summary (Last 24 hours) at 07/01/2018 1032 Last data filed at 07/01/2018 0900 Gross per 24 hour  Intake 480 ml  Output -  Net 480 ml     Physical Exam: Vital Signs Blood pressure 112/79, pulse 87, temperature 99.1 F (37.3 C), temperature source Oral, resp. rate 18, height 5\' 6"  (1.676 m), weight 58.3 kg, SpO2 99 %.  Constitutional: No distress . Vital signs reviewed. HEENT: EOMI, oral membranes moist Neck: supple Cardiovascular: RRR without murmur. No JVD    Respiratory: CTA Bilaterally without wheezes or rales. Normal effort    GI: BS +, non-tender, non-distended  Skin: No evidence of breakdown, no evidence of rash Neurologic: Cranial nerves II through XII intact, motor strength is 5/5 in LEFT   deltoid, bicep, tricep, grip, hip flexor, knee extensors, ankle dorsiflexor and plantar flexor 3 to 3+ R finger flex/ext, 3+ thumb flex, 2-3/5 proximally Sensory exam normal sensation to light touch and proprioception in bilateral upper and lower extremities Musculoskeletal: Full range of motion in all 4 extremities. No joint swelling Psych: pleasant and appropriate    Assessment/Plan: 1. Functional deficits secondary to Left pontine and thalamic infarcts which require 3+ hours per day of interdisciplinary therapy in a comprehensive inpatient rehab setting.  Physiatrist is providing close team supervision and 24  hour management of active medical problems listed below.  Physiatrist and rehab team continue to assess barriers to discharge/monitor patient progress toward functional and medical goals  Care Tool:  Bathing    Body parts bathed by patient: Right arm, Chest, Abdomen, Front perineal area, Buttocks, Right upper leg, Left upper leg, Right lower leg, Left lower leg, Face, Left arm   Body parts bathed by helper: Left arm     Bathing assist Assist Level: Supervision/Verbal cueing     Upper Body Dressing/Undressing Upper body dressing   What is the patient wearing?: Bra, Pull over shirt    Upper body assist Assist Level: Set up assist    Lower Body Dressing/Undressing Lower body dressing      What is the patient wearing?: Underwear/pull up, Pants     Lower body assist Assist for lower body dressing: Supervision/Verbal cueing     Toileting Toileting Toileting Activity did not occur (Clothing management and hygiene only): N/A (no void or bm)  Toileting assist Assist for toileting: Supervision/Verbal cueing     Transfers Chair/bed transfer  Transfers assist     Chair/bed transfer assist level: Supervision/Verbal cueing     Locomotion Ambulation   Ambulation assist      Assist level: Supervision/Verbal cueing Assistive device: Walker-rolling(R AFO, R toe cap, R hand orthosis) Max distance: 200 ft    Walk 10 feet activity   Assist  Walk 10 feet activity did not occur: Safety/medical concerns  Assist level: Supervision/Verbal cueing Assistive device: Walker-rolling(R AFO, R toe  cap, R hand orthosis)   Walk 50 feet activity   Assist Walk 50 feet with 2 turns activity did not occur: Safety/medical concerns  Assist level: Supervision/Verbal cueing Assistive device: Walker-rolling(R AFO, R toe cap, R hand orthosis)    Walk 150 feet activity   Assist Walk 150 feet activity did not occur: Safety/medical concerns  Assist level: Supervision/Verbal  cueing Assistive device: Walker-rolling(R AFO, R toe cap, R hand orthosis)    Walk 10 feet on uneven surface  activity   Assist Walk 10 feet on uneven surfaces activity did not occur: Safety/medical concerns         Wheelchair     Assist Will patient use wheelchair at discharge?: (TBD) Type of Wheelchair: Manual    Wheelchair assist level: Supervision/Verbal cueing Max wheelchair distance: 100 ft    Wheelchair 50 feet with 2 turns activity    Assist    Wheelchair 50 feet with 2 turns activity did not occur: Safety/medical concerns   Assist Level: Supervision/Verbal cueing   Wheelchair 150 feet activity     Assist Wheelchair 150 feet activity did not occur: Safety/medical concerns           Medical Problem List and Plan: 1.   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 DVT Prophylaxis/Anticoagulation: Pharmaceutical: Lovenox 3. Pain Management: Tylenol  -has flexeril prn for cramps 4. Mood: We will monitor, may need neuropsych consult, trazodone for sleep- mood improving  5. Neuropsych: This patient is capable of making decisions on her own behalf. 6. Skin/Wound Care: No areas identified we will continue to monitor 7. Fluids/Electrolytes/Nutrition: encourage PO 8.  COPD Dulera inhaler 2 puffs twice a day, Singulair 10 mg at bedtime No wheezing      9.  History of hypertension on HCTZ and verapamil  Vitals:   07/01/18 0543 07/01/18 0747  BP: 112/79   Pulse: 87   Resp: 18   Temp:    SpO2: 99% 99%   -resumed low dose HCTZ, better control  11/14 10.  Mild leukocytosis , odondogenic infxn on Augmentin 14d per DDS  afebrile cont to monitor---off abx now.    11. Mild anemia, no obvious sign of blood loss ,  stool guaic Neg LOS: 14 days A FACE TO [facility] 07/01/2018, 10:32 AM

## 2018-07-01 NOTE — Progress Notes (Signed)
Physical Therapy Discharge Summary  Patient Details  Name: Judy Boyd MRN: [id number] Date of Birth: Oct 11, 1960  Today's Date: 07/01/2018 PT Individual Time: 1415-1445 PT Individual Time Calculation (min): 30 min    Patient has met 11 of 11 long term goals due to improved activity tolerance, improved balance, improved postural control, increased strength, ability to compensate for deficits, functional use of  right upper extremity and right lower extremity, improved attention and improved awareness.  Patient to discharge at an ambulatory level Supervision.   Patient's care partner is independent to provide the necessary cognitive assistance at discharge.  Reasons goals not met: na  Recommendation:  Patient will benefit from ongoing skilled PT services in home health setting to continue to advance safe functional mobility, address ongoing impairments in endurance, motor control, balance, safety, and minimize fall risk.  Equipment: transport chair, RW, R hand splint, R AFO  Reasons for discharge: treatment goals met and discharge from hospital  Patient/family agrees with progress made and goals achieved: Yes  PT Discharge  tx 1:  Family ed with dtr for basic and simulated car transfers with supervision, folding/unfolding and managing leg rests of w/c, supervising gait on level tile.  Dtr needed more practice for supervising gait.  tx 2: family ed continued with dtr for gait on level tile, carpet, transfer to rocking recliner and up/down 4 steps. When fatigued, pt had LOB R when sitting down on mat; dtr observed and realized that she needed to stay closer to her mother. She improved during session.  Dtr guarded pt up/down 4 steps L rail, close supervision.  Dtr now safe to supervise pt for mobility and gait.  Discussed use of 911 for fall recovery at home; pt and dtr agreed.   Precautions/Restrictions Falls; slightly impulsive     Pain Pain Assessment Pain Scale: 0-10 Pain  Score: 0-No pain Vision/Perception  Vision - Assessment Tracking/Visual Pursuits: Able to track stimulus in all quads without difficulty Saccades: Within functional limits  Cognition Behaviors: Impulsive;Poor frustration tolerance;Restless Safety/Judgment: Impaired Sensation Sensation Light Touch: Appears Intact(BLE) Proprioception: Appears Intact(BLE) Coordination Gross Motor Movements are Fluid and Coordinated: No(limited by weakness in RUE/RLE) Fine Motor Movements are Fluid and Coordinated: Yes(LLE, LUE) Finger Nose Finger Test: WFL on Lt, still unable to reach towards nose with RUE Heel Shin Test: able to cross R over L, but limited excursion Motor  Motor Motor: Hemiplegia;Abnormal postural alignment and control Motor - Skilled Clinical Observations: R hemiplegia, generalized deconditioning Motor - Discharge Observations: R hemiplegia, generalized deconditioning  Mobility Bed Mobility Bed Mobility: Not assessed(due to time constraints for training family) Transfers Transfers: Sit to Stand;Stand to [employer] Transfers Sit to Stand: Supervision/Verbal cueing Stand to Sit: Supervision/Verbal cueing Stand Pivot Transfers: Supervision/Verbal cueing Stand Pivot Transfer Details: Verbal cues for precautions/safety;Verbal cues for safe use of DME/AE Transfer (Assistive device): Rolling walker(R hand splint and R AFO) Locomotion  Gait Ambulation: Yes Gait Assistance: Supervision/Verbal cueing Gait Distance (Feet): 150 Feet Assistive device: Rolling walker Gait Assistance Details: Verbal cues for safe use of DME/AE;Verbal cues for technique Gait Gait: Yes Gait Pattern: Impaired Gait Pattern: Narrow base of support;Decreased trunk rotation;Decreased hip/knee flexion - right;Decreased dorsiflexion - right;Step-through pattern;Decreased step length - right;Decreased step length - left;Right flexed knee in stance Gait velocity: slow Stairs / Additional Locomotion Stairs:  Yes Stairs Assistance: Supervision/Verbal cueing Stair Management Technique: One rail Left Number of Stairs: 4 Height of Stairs: 6 Wheelchair Mobility Wheelchair Mobility: Yes Wheelchair Assistance: Supervision/Verbal cueing Wheelchair Propulsion: Left upper  extremity Wheelchair Parts Management: Needs assistance Distance: 150  Trunk/Postural Assessment  Cervical Assessment Cervical Assessment: Within Functional Limits Thoracic Assessment Thoracic Assessment: Within Functional Limits Lumbar Assessment Lumbar Assessment: Within Functional Limits Postural Control Postural Control: Deficits on evaluation Righting Reactions: delayed Protective Responses: delayed  Balance Balance Balance Assessed: Yes Standardized Balance Assessment Standardized Balance Assessment: Timed Up and Go Test Timed Up and Go Test TUG: Normal TUG(with RW, R hand orthosis, R AFO, R toe cap with min assist form therapist, on 11/12) Normal TUG (seconds): 34.91(average of 36.13, 35.38, and 33.23 seconds) Static Sitting Balance Static Sitting - Balance Support: Feet supported Static Sitting - Level of Assistance: 6: Modified independent (Device/Increase time) Dynamic Sitting Balance Dynamic Sitting - Level of Assistance: 6: Modified independent (Device/Increase time) Sitting balance - Comments: donning/doffing RAFO Static Standing Balance Static Standing - Level of Assistance: 5: Stand by assistance Dynamic Standing Balance Dynamic Standing - Balance Support: Left upper extremity supported;During functional activity;Bilateral upper extremity supported Dynamic Standing - Level of Assistance: 5: Stand by assistance Extremity Assessment      RLE Assessment RLE Assessment: Exceptions to [facility] Passive Range of Motion (PROM) Comments: WFL Active Range of Motion (AROM) Comments: grossly in sitting: 2-/5 hip flexion; 3-/5 knee extension,  2-/5 ankle DF mostly inversion;  able to accept some resistance R knee  extension LLE Assessment LLE Assessment: Within Functional Limits    Shanyah Gattuso 07/01/2018, 5:37 PM

## 2018-07-01 NOTE — Progress Notes (Signed)
Speech Language Pathology Discharge Summary  Patient Details  Name: Judy Boyd MRN: [id number] Date of Birth: Aug 20, 1960  Today's Date: 07/01/2018 SLP Individual Time: 1202-1232 SLP Individual Time Calculation (min): 30 min   Skilled Therapeutic Interventions:  Skilled treatment sessions focused on communication goals and education to increase safety awareness with daughter present to prevent readmission. Education provided to daughter that pt requires active supervision during ambulation d/t decreased awareness of physical deficits on right and pt's stubborn desire to perform tasks independently. Daughter frequently on her cell phone during education but voiced understanding. SLP further facilitated intelligible verbal communication with Mod I use of speech intelligibility strategies when talking on the phone to disability office. Pt left in daughter's care and all needs within reach.     Patient has met 3 of 3 long term goals.  Patient to discharge at overall Modified Independent;Supervision level.    Clinical Impression/Discharge Summary:   Pt has made good progress in skilled ST sessions and as a result she has met 3 of 3 LTGs. Pt's speech is > 95% intelligible at the simple conversation level. Pt's awareness of physical deficits has increased but pt is at risk of falling d/t stubborn independence. Extensive education provided. No further follow up ST services indicated at this time.    Care Partner:  Caregiver Able to Provide Assistance: Yes  Type of Caregiver Assistance: Physical;Cognitive  Recommendation:  24 hour supervision/assistance      Equipment:     Reasons for discharge: Treatment goals met;Discharged from hospital   Patient/Family Agrees with Progress Made and Goals Achieved: Yes    Nelma Phagan 07/01/2018, 12:33 PM

## 2018-07-01 NOTE — Progress Notes (Signed)
Recreational Therapy Session Note  Patient Details  Name: Judy Boyd MRN: [id number] Date of Birth: 01/13/1961 Today's Date: 07/01/2018 LATE ENTRY for 06/29/18 Time:  1105-12 Pain: no c/o Skilled Therapeutic Interventions/Progress Updates:  Goals:  Pt will ambulate using RW getting on and off the elevator, ambulating in simulated community environment with min assist.- MET  Pt seen during co-treat with PT with emphasis on community pursuits.  Pt ambulated using RW throughout the hospital with close supervision-min assist including accessing the elevators up to ~200" before rest break needed.  Pt required min verbal cues throughout the session for safety.  Therapy/Group: Co-Treatment SIMPSON,LISA 07/01/2018, 12:23 PM  

## 2018-07-01 NOTE — Progress Notes (Signed)
Occupational Therapy Discharge Summary  Patient Details  Name: Judy Boyd MRN: [id number] Date of Birth: Sep 14, 1960  Patient has met 52 of 59 long term goals due to improved balance, postural control, ability to compensate for deficits, functional use of  RIGHT upper and RIGHT lower extremity, improved attention, improved awareness and improved coordination.  Patient to discharge at overall Supervision level.  Patient's care partner is independent to provide the necessary supervision assistance at discharge.    Reasons goals not met: N/A  Recommendation:  Patient will benefit from ongoing skilled OT services in home health setting to continue to advance functional skills in the area of BADL and Reduce care partner burden.  Equipment: 3 in1 and tub transfer bench  Reasons for discharge: treatment goals met and discharge from hospital  Patient/family agrees with progress made and goals achieved: Yes  OT Discharge Precautions/Restrictions  Precautions Precautions: Fall Precaution Comments: R hemi General   Vital Signs Oxygen Therapy SpO2: 99 % O2 Device: Room Air Pain Pain Assessment Pain Scale: 0-10 Pain Score: 0-No pain ADL ADL Eating: Modified independent Where Assessed-Eating: Wheelchair Grooming: Supervision/safety Where Assessed-Grooming: Standing at sink Upper Body Bathing: Supervision/safety Where Assessed-Upper Body Bathing: Shower Lower Body Bathing: Supervision/safety Where Assessed-Lower Body Bathing: Shower Upper Body Dressing: Setup Where Assessed-Upper Body Dressing: Wheelchair, Sitting at sink Lower Body Dressing: Supervision/safety Where Assessed-Lower Body Dressing: Wheelchair, Sitting at sink, Standing at sink Toileting: Supervision/safety Where Assessed-Toileting: Glass blower/designer: Close supervision Toilet Transfer Method: Counselling psychologist: Grab bars(RW) Tub/Shower Transfer: Close supervison Clinical cytogeneticist  Method: Optometrist: Facilities manager: Close supervision Social research officer, government Method: Radiographer, therapeutic: Radio broadcast assistant, Grab bars Vision Baseline Vision/History: Wears glasses(while driving) Patient Visual Report: No change from baseline Vision Assessment?: Yes Tracking/Visual Pursuits: Able to track stimulus in all quads without difficulty Saccades: Within functional limits Visual Fields: No apparent deficits Cognition Overall Cognitive Status: Within Functional Limits for tasks assessed Arousal/Alertness: Awake/alert Orientation Level: Oriented X4 Awareness: Impaired Awareness Impairment: (decreased safety awareness) Problem Solving: Appears intact Reasoning: Impaired(d/t decreased safety awareness) Behaviors: Impulsive;Poor frustration tolerance;Restless Safety/Judgment: Impaired Comments: d/t decreased awareness of physical deficits and baseline stubborn behaviors Sensation Sensation Light Touch: Appears Intact Proprioception: Appears Intact Coordination Gross Motor Movements are Fluid and Coordinated: No Fine Motor Movements are Fluid and Coordinated: No Finger Nose Finger Test: WFL on Lt, still unable to reach towards nose with RUE Extremity/Trunk Assessment RUE Assessment RUE Assessment: Exceptions to [facility] Passive Range of Motion (PROM) Comments: WFL Active Range of Motion (AROM) Comments: shoulder flexion 80* against gravity, elbow flexion 120, extension to 30* (unable to achieve neutral), wrist flexion WFL, extension only to neutral.  Finger flexion and extension approaching WFL RUE Body System: Neuro Brunstrum levels for arm and hand: Arm;Hand Brunstrum level for arm: Stage IV Movement is deviating from synergy Brunstrum level for hand: Stage IV Movements deviating from synergies LUE Assessment LUE Assessment: Within Functional Limits   Faith Patricelli 07/01/2018, 3:10 PM

## 2018-07-01 NOTE — Discharge Summary (Signed)
Physician Discharge Summary  Patient ID: TOYIA JELINEK MRN: [id number] DOB/AGE: 1960-09-29 57 y.o.  Admit date: 06/17/2018 Discharge date: 07/01/2018  Discharge Diagnoses:  Principal Problem:   Stroke (cerebrum) [facility] Active Problems:   Hypertension   COPD (chronic obstructive pulmonary disease) (HCC)   Acute ischemic stroke (HCC)   Tobacco abuse   Hemiparesis affecting right side as late effect of stroke ([place])   Dental disease   Discharged Condition:  Stable   Significant Diagnostic Studies: N/A   Labs:  Basic Metabolic Panel: BMP Latest Ref Rng & Units 06/30/2018 06/23/2018 06/18/2018  Glucose 70 - 99 mg/dL 94 87 95  BUN 6 - 20 mg/dL 21(H) 25(H) 23(H)  Creatinine 0.44 - 1.00 mg/dL 0.74 0.66 0.77  Sodium 135 - 145 mmol/L 137 140 141  Potassium 3.5 - 5.1 mmol/L 4.0 4.2 4.5  Chloride 98 - 111 mmol/L 107 110 109  CO2 22 - 32 mmol/L 24 28 27   Calcium 8.9 - 10.3 mg/dL 9.7 9.5 9.5    CBC: CBC Latest Ref Rng & Units 06/30/2018 06/23/2018 06/18/2018  WBC 4.0 - 10.5 K/uL 11.9(H) 11.5(H) 10.8(H)  Hemoglobin 12.0 - 15.0 g/dL 11.9(L) 11.0(L) 11.2(L)  Hematocrit 36.0 - 46.0 % 37.8 37.6 38.5  Platelets 150 - 400 K/uL 387 383 310    CBG: No results for input(s): GLUCAP in the last 168 hours.  Brief HPI:   Judy Boyd. Sanford is a right-handed female with history of COPD, HTN, tobacco use who was admitted 06/12/2018 with 2-day history of right facial droop, dysarthria and right-sided weakness.  CT of head done revealing low attenuation in left caudate head likely due to remote infarct.  CTA head was negative for significant stenosis and showed incidental findings of soft tissue inflammation of right mandible as well as extensive dental disease and right thyroid nodule.  She was started on aspirin for secondary stroke prevention as well as Augmentin for odontogenic infection.    On 06/15/2018 she developed increasing right-sided weakness felt to be due to extension of stroke and was  transferred to [facility] for treatment.  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  Neurology recommended adding Plavix x3 weeks followed by aspirin alone.  Therapy evaluations done revealing functional deficits and CIR was recommended for follow-up therapy.    Hospital Course: ONICA DAVIDOVICH was admitted to rehab 06/17/2018 for inpatient therapies to consist of PT, ST and OT at least three hours five days a week. Past admission physiatrist, therapy team and rehab RN have worked together to provide customized collaborative inpatient rehab. She was maintained on aspirin and Plavix for secondary stroke prevention.  Blood pressures have been monitored on twice daily basis and HCTZ was resumed with improvement in control.  She has completed 2-week course of Augmentin and follow-up CBC shows mild persistent leukocytosis.  She was given information regarding dental study at [facility] but reports that she is planning on following up with her dentist past discharge.    Follow-up labs reveale mild prerenal azotemia and she was encouraged to push p.o. Fluids.  CBC showed some drop in H&H but no signs of bleeding noted.  Stool guaiac x2 has been negative.  P.o. intake has been good and she is continent of bowel and bladder.  She has been educated on importance of maintaining his heart healthy diet as  well as tobacco cessation.  She has been given information/paperwor to follow up with [facility] clinic to set up with PCP. She has made progress during her rehab stay and is currently at supervision level.  She will continue to receive further follow-up home health PT, OT and speech therapy by [facility] past discharge   Rehab course: During patient's stay in rehab weekly team conferences were held to monitor patient's progress, set goals and discuss  barriers to discharge. At admission, patient required Mod assist with cues for ADL task as well as mobility.  Cognitive evaluation revealed functional cognitive abilities but she exhibited poor awareness of deficits with poor test tolerance and frustration and moderate dysarthria with mild inconsistent word finding deficits.  She  has had improvement in activity tolerance, balance, postural control as well as ability to compensate for deficits. She has had improvement in functional use RUE  and RLE as well as improvement in awareness.  She is able to complete ADL tasks with supervision.  She requires close supervision with verbal cues for transfers and to ambulate 150 feet with rolling walker.  Speech is greater than 95% intelligible at conversation level.  Her awareness of deficits has improved but she she continues to have impulsivity with poor frustration tolerance putting her at risk for falls.  Family education was done with daughter regarding all aspects of care as well as need for supervision for fall prevention.   Disposition:  Home  Diet: Heart healthy  Special Instructions: 1.  No driving or strenuous activity until cleared by MD. 2.  Plavix to continue for 1 additional week then DC.  Continue taking aspirin daily for stroke prevention 3. Will need work up for thyroid nodule.   Discharge Instructions    Ambulatory referral to Physical Medicine Rehab   Complete by:  As directed    1-2 weeks transitional care appt     Allergies as of 07/01/2018      Reactions   Amlodipine Other (See Comments)   Dehydration       Medication List    STOP taking these medications   amoxicillin-clavulanate 875-125 MG tablet Commonly known as:  AUGMENTIN   gabapentin 300 MG capsule Commonly known as:  NEURONTIN   verapamil 120 MG tablet Commonly known as:  CALAN     TAKE these medications   acetaminophen 325 MG tablet Commonly known as:  TYLENOL Take 1-2 tablets (325-650 mg total) by  mouth every 4 (four) hours as needed for mild pain. What changed:    how much to take  reasons to take this   albuterol 108 (90 Base) MCG/ACT inhaler Commonly known as:  PROVENTIL HFA;VENTOLIN HFA Inhale 2 puffs into the lungs every 6 (six) hours as needed. Shortness of breath   aspirin EC 81 MG tablet Take 81 mg by mouth daily.   atorvastatin 40 MG tablet Commonly known as:  LIPITOR Take 1 tablet (40 mg total) by mouth daily at 6 PM.   budesonide-formoterol 160-4.5 MCG/ACT inhaler Commonly known as:  SYMBICORT Inhale 2 puffs into the lungs 2 (two) times daily.   clopidogrel 75 MG tablet Commonly known as:  PLAVIX Take 1 tablet (75 mg total) by mouth daily. Notes to patient:  For one additional week   clorazepate 7.5 MG tablet Commonly known as:  TRANXENE Take 7.5 mg by mouth 2 (two) times daily as needed for anxiety.   hydrochlorothiazide 12.5 MG tablet Commonly known as:  HYDRODIURIL Take 1 tablet (12.5 mg  total) by mouth daily. What changed:  See the new instructions.   montelukast 10 MG tablet Commonly known as:  SINGULAIR Take 10 mg by mouth at bedtime.   nicotine 14 mg/24hr patch Commonly known as:  NICODERM CQ - dosed in mg/24 hours Place 1 patch (14 mg total) onto the skin daily.   polyethylene glycol packet Commonly known as:  MIRALAX / GLYCOLAX Take 17 g by mouth daily. Notes to patient:  For constipation.      Follow-up Information    [facility]. Call.   Why:  to set up as primary MD       Kirsteins, Luanna Salk, MD Follow up.   Specialty:  Physical Medicine and Rehabilitation Why:  Office to call you with follow up appointment Contact information: [place] [address] [phone]        Garvin Fila, MD. Call.   Specialties:  Neurology, Radiology Why:  for follow up appointment Contact information: [address] [place] [address] [phone]        Consuella Lose,  MD. Call.   Specialty:  Neurosurgery Why:  for follow up on aneurysm.  Contact information: [address] [place] [address] [phone]           Signed: Bary Leriche 07/02/2018, 11:40 AM

## 2018-07-02 ENCOUNTER — Inpatient Hospital Stay (HOSPITAL_COMMUNITY): Payer: Medicaid Other | Admitting: Speech Pathology

## 2018-07-02 DIAGNOSIS — K089 Disorder of teeth and supporting structures, unspecified: Secondary | ICD-10-CM

## 2018-07-05 ENCOUNTER — Telehealth: Payer: Self-pay | Admitting: *Deleted

## 2018-07-05 NOTE — Telephone Encounter (Signed)
Request PT POC 2wk3.  Approval given.

## 2018-07-12 ENCOUNTER — Telehealth (HOSPITAL_COMMUNITY): Payer: Self-pay | Admitting: General Practice

## 2018-07-12 NOTE — Telephone Encounter (Signed)
07/12/18  Patient's daughter called and said they aren't happy with home health and they want her here for therapy.  I explained to her that because of her insurance Home health will need to discharge her so that we can get approval for her to come here.  I told her that as soon as patient was discharged to call us and we can schedule.

## 2018-07-13 ENCOUNTER — Encounter: Payer: Medicaid Other | Attending: Registered Nurse | Admitting: Registered Nurse

## 2018-07-13 ENCOUNTER — Encounter: Payer: Self-pay | Admitting: Registered Nurse

## 2018-07-13 VITALS — BP 128/83 | HR 80 | Ht 68.0 in | Wt 128.0 lb

## 2018-07-13 DIAGNOSIS — J449 Chronic obstructive pulmonary disease, unspecified: Secondary | ICD-10-CM | POA: Insufficient documentation

## 2018-07-13 DIAGNOSIS — K089 Disorder of teeth and supporting structures, unspecified: Secondary | ICD-10-CM | POA: Diagnosis not present

## 2018-07-13 DIAGNOSIS — R262 Difficulty in walking, not elsewhere classified: Secondary | ICD-10-CM | POA: Diagnosis not present

## 2018-07-13 DIAGNOSIS — I6381 Other cerebral infarction due to occlusion or stenosis of small artery: Secondary | ICD-10-CM | POA: Diagnosis present

## 2018-07-13 DIAGNOSIS — E041 Nontoxic single thyroid nodule: Secondary | ICD-10-CM | POA: Diagnosis not present

## 2018-07-13 DIAGNOSIS — K219 Gastro-esophageal reflux disease without esophagitis: Secondary | ICD-10-CM | POA: Diagnosis not present

## 2018-07-13 DIAGNOSIS — F419 Anxiety disorder, unspecified: Secondary | ICD-10-CM | POA: Diagnosis not present

## 2018-07-13 DIAGNOSIS — I69351 Hemiplegia and hemiparesis following cerebral infarction affecting right dominant side: Secondary | ICD-10-CM | POA: Diagnosis not present

## 2018-07-13 DIAGNOSIS — M79642 Pain in left hand: Secondary | ICD-10-CM | POA: Diagnosis not present

## 2018-07-13 DIAGNOSIS — M1990 Unspecified osteoarthritis, unspecified site: Secondary | ICD-10-CM | POA: Diagnosis not present

## 2018-07-13 DIAGNOSIS — F329 Major depressive disorder, single episode, unspecified: Secondary | ICD-10-CM | POA: Insufficient documentation

## 2018-07-13 DIAGNOSIS — I729 Aneurysm of unspecified site: Secondary | ICD-10-CM | POA: Diagnosis not present

## 2018-07-13 DIAGNOSIS — R202 Paresthesia of skin: Secondary | ICD-10-CM | POA: Diagnosis not present

## 2018-07-13 DIAGNOSIS — G8929 Other chronic pain: Secondary | ICD-10-CM | POA: Insufficient documentation

## 2018-07-13 DIAGNOSIS — I10 Essential (primary) hypertension: Secondary | ICD-10-CM | POA: Diagnosis not present

## 2018-07-13 DIAGNOSIS — Z8249 Family history of ischemic heart disease and other diseases of the circulatory system: Secondary | ICD-10-CM | POA: Diagnosis not present

## 2018-07-13 DIAGNOSIS — F17210 Nicotine dependence, cigarettes, uncomplicated: Secondary | ICD-10-CM | POA: Diagnosis not present

## 2018-07-13 DIAGNOSIS — Z1331 Encounter for screening for depression: Secondary | ICD-10-CM | POA: Diagnosis not present

## 2018-07-13 NOTE — Progress Notes (Signed)
Subjective:    Patient ID: Judy Boyd, female    DOB: [birth year], 57 y.o.   MRN: [id number]  HPI: Judy Boyd is a 57 y.o. female who is here for Transitional Care visit in  follow up of her Lacunar Infarction, right hemiparesis and dental disease. She has been home receiving [place] with [place]. Also reports she is walking with walker with the therapist, she didn't bring her walker today, she was instructed to bring walker to her next scheduled visit. She and daughter verbalizes understanding.  She states she has pain in her left hand. She rated her pain 0. Also reports good appetite.   [name] asked about driving, she was instructed and discharge instructions was reviewed with her and daughter. No Driving until cleared by a MD either Dr. Letta Pate or her Neurologist. This was reiterated several times and safety precautions was reviewed. She and daughter verbalized understanding.   Arrived in wheelchair and daughter in room.   Pain Inventory Average Pain 0 Pain Right Now 0 My pain is no pain  In the last 24 hours, has pain interfered with the following? General activity 0 Relation with others 0 Enjoyment of life 0 What TIME of day is your pain at its worst? no pain Sleep (in general) Good  Pain is worse with: no pain Pain improves with: no pain Relief from Meds: no pain  Mobility walk with assistance use a walker ability to climb steps?  yes do you drive?  no use a wheelchair needs help with transfers  Function not employed: date last employed . disabled: date disabled . I need assistance with the following:  meal prep, household duties and shopping  Neuro/Psych bladder control problems tingling trouble walking  Prior Studies transitional care  Physicians involved in your care transitional care   Family History  Problem Relation Age of Onset  . Heart disease Mother   . Heart disease Father    Social History    Socioeconomic History  . Marital status: Single    Spouse name: Not on file  . Number of children: 2  . Years of education: Not on file  . Highest education level: Not on file  Occupational History  . Occupation: unemployed  Social Needs  . Financial resource strain: Not on file  . Food insecurity:    Worry: Not on file    Inability: Not on file  . Transportation needs:    Medical: Not on file    Non-medical: Not on file  Tobacco Use  . Smoking status: Current Every Day Smoker    Packs/day: 1.00    Years: 10.00    Pack years: 10.00    Types: Cigarettes  . Smokeless tobacco: Never Used  Substance and Sexual Activity  . Alcohol use: Yes    Comment: 2 beers per week  . Drug use: No  . Sexual activity: Yes    Birth control/protection: None  Lifestyle  . Physical activity:    Days per week: Not on file    Minutes per session: Not on file  . Stress: Not on file  Relationships  . Social connections:    Talks on phone: Not on file    Gets together: Not on file    Attends religious service: Not on file    Active member of club or organization: Not on file    Attends meetings of clubs or organizations: Not on file    Relationship status: Not on  file  Other Topics Concern  . Not on file  Social History Narrative  . Not on file   Past Surgical History:  Procedure Laterality Date  . ABDOMINAL SURGERY  2003   blockage  . left salpingectomy    . uterine ablation     Past Medical History:  Diagnosis Date  . Anxiety   . Arthritis   . Asthma   . Bowel obstruction ([place])   . Chronic pain   . COPD (chronic obstructive pulmonary disease) ([place])   . Depression   . GERD (gastroesophageal reflux disease)   . Hypertension    BP 128/83   Pulse 80   Ht 5\' 8"  (1.727 m)   Wt 128 lb (58.1 kg)   SpO2 96%   BMI 19.46 kg/m   Opioid Risk Score:   Fall Risk Score:  `1  Depression screen PHQ 2/9  Depression screen [employer] 2/9 07/13/2018 12/11/2016  Decreased Interest 2 2   Down, Depressed, Hopeless 1 0  PHQ - 2 Score 3 2  Altered sleeping 1 2  Tired, decreased energy 3 3  Change in appetite 2 0  Feeling bad or failure about yourself  1 0  Trouble concentrating 2 0  Moving slowly or fidgety/restless 2 2  Suicidal thoughts 0 0  PHQ-9 Score 14 9  Difficult doing work/chores Extremely dIfficult Somewhat difficult    Review of Systems  Constitutional: Positive for unexpected weight change.  HENT: Negative.   Respiratory: Positive for shortness of breath and wheezing.   Cardiovascular: Negative.   Gastrointestinal: Positive for constipation.  Endocrine: Negative.   Genitourinary: Positive for difficulty urinating.  Musculoskeletal: Positive for gait problem.  Allergic/Immunologic: Negative.   Neurological:       Tingling  Psychiatric/Behavioral: Positive for dysphoric mood.  All other systems reviewed and are negative.      Objective:   Physical Exam  Constitutional: She is oriented to person, place, and time. She appears well-developed and well-nourished.  HENT:  Head: Normocephalic and atraumatic.  Neck: Normal range of motion. Neck supple.  Cardiovascular: Normal rate and regular rhythm.  Pulmonary/Chest: Effort normal and breath sounds normal.  Musculoskeletal:  Normal Muscle Bulk and Muscle Testing Reveals:  Upper Extremities: Right: Decreased ROM 90 Degrees and Muscle Strength 3/5 Left: Full ROM and Muscle Strength 5/5 Lower Extremities: Right: Decreased ROM and Muscle Strength 4/5 Wearing AFO Left: Full ROM and Muscle Strength 5/5 Arrived in wheelchair  Neurological: She is alert and oriented to person, place, and time.  Skin: Skin is warm and dry.  Psychiatric: She has a normal mood and affect. Her behavior is normal.  Nursing note and vitals reviewed.         Assessment & Plan:  1. Lacunar Infarction: Continue Home Health Therapies with [facility]: Has a scheduled F/U appointment with Neurology 2. Hemiparesis  affecting right side as late effect of stroke: [place] with [facility]. Has a scheduled appointment with Neurology.  3. Dental Disease: Encouraged to scheduled a Dental Appointment.[name] and Daughter verbalize understanding.  4. Thyroid Nodule: PCP to Follow Up: Educated [name] and Daughter they verbalize understanding.  5. Aneurysm: Encouraged to scheduled an appointment with Dr. Kathyrn Sheriff, address and phone number given. They verbalize understanding.   40 minutes of face to face patient care time was spent during this visit. All questions were encouraged and answered.  F/U in 4-6 weeks with Dr Letta Pate.

## 2018-07-19 DIAGNOSIS — Z7902 Long term (current) use of antithrombotics/antiplatelets: Secondary | ICD-10-CM

## 2018-07-19 DIAGNOSIS — G894 Chronic pain syndrome: Secondary | ICD-10-CM

## 2018-07-19 DIAGNOSIS — I69322 Dysarthria following cerebral infarction: Secondary | ICD-10-CM | POA: Diagnosis not present

## 2018-07-19 DIAGNOSIS — I69351 Hemiplegia and hemiparesis following cerebral infarction affecting right dominant side: Secondary | ICD-10-CM | POA: Diagnosis not present

## 2018-07-19 DIAGNOSIS — I10 Essential (primary) hypertension: Secondary | ICD-10-CM

## 2018-07-19 DIAGNOSIS — F419 Anxiety disorder, unspecified: Secondary | ICD-10-CM

## 2018-07-19 DIAGNOSIS — M1990 Unspecified osteoarthritis, unspecified site: Secondary | ICD-10-CM

## 2018-07-19 DIAGNOSIS — K219 Gastro-esophageal reflux disease without esophagitis: Secondary | ICD-10-CM

## 2018-07-19 DIAGNOSIS — Z9181 History of falling: Secondary | ICD-10-CM

## 2018-07-19 DIAGNOSIS — Z7982 Long term (current) use of aspirin: Secondary | ICD-10-CM

## 2018-07-19 DIAGNOSIS — J449 Chronic obstructive pulmonary disease, unspecified: Secondary | ICD-10-CM

## 2018-07-19 DIAGNOSIS — F329 Major depressive disorder, single episode, unspecified: Secondary | ICD-10-CM

## 2018-07-19 DIAGNOSIS — F17210 Nicotine dependence, cigarettes, uncomplicated: Secondary | ICD-10-CM

## 2018-07-20 ENCOUNTER — Other Ambulatory Visit: Payer: Self-pay

## 2018-07-20 ENCOUNTER — Ambulatory Visit (HOSPITAL_COMMUNITY): Payer: Medicaid Other | Attending: Internal Medicine

## 2018-07-20 DIAGNOSIS — R29898 Other symptoms and signs involving the musculoskeletal system: Secondary | ICD-10-CM | POA: Insufficient documentation

## 2018-07-20 DIAGNOSIS — R2689 Other abnormalities of gait and mobility: Secondary | ICD-10-CM | POA: Diagnosis present

## 2018-07-20 DIAGNOSIS — I639 Cerebral infarction, unspecified: Secondary | ICD-10-CM | POA: Insufficient documentation

## 2018-07-20 DIAGNOSIS — M6281 Muscle weakness (generalized): Secondary | ICD-10-CM | POA: Insufficient documentation

## 2018-07-20 DIAGNOSIS — G8111 Spastic hemiplegia affecting right dominant side: Secondary | ICD-10-CM | POA: Insufficient documentation

## 2018-07-20 NOTE — Patient Instructions (Signed)
Flexors, Supine Bridge    Lie supine, feet shoulder-width apart. Lift hips toward ceiling. Hold _5__ seconds. Repeat _10-15__ times per session. Do _1__ sessions per day.  Copyright  VHI. All rights reserved.

## 2018-07-20 NOTE — Therapy (Signed)
[place], [place], [address] Phone: [phone]   Fax:  [phone]  Physical Therapy Evaluation  Patient Details  Name: Judy Boyd MRN: [id number] Date of Birth: [birth year] Referring Provider (PT): Letta Pate Luanna Salk, MD   Encounter Date: 07/20/2018  PT End of Session - 07/20/18 1042    Visit Number  1    Number of Visits  12    Date for PT Re-Evaluation  09/14/18    Authorization Type  Medicaid    Authorization Time Period  requesting 3 more visits 07/20/2018; next appt 07/23/2018; then request 12 additional visits for a total of 16 visits. 2xwk/8wks    Authorization - Visit Number  1    Authorization - Number of Visits  1    PT Start Time  [phone]    PT Stop Time  1032    PT Time Calculation (min)  40 min    Activity Tolerance  Patient tolerated treatment well    Behavior During Therapy  WFL for tasks assessed/performed       Past Medical History:  Diagnosis Date  . Anxiety   . Arthritis   . Asthma   . Bowel obstruction ([place])   . Chronic pain   . COPD (chronic obstructive pulmonary disease) ([place])   . Depression   . GERD (gastroesophageal reflux disease)   . Hypertension     Past Surgical History:  Procedure Laterality Date  . ABDOMINAL SURGERY  2003   blockage  . left salpingectomy    . uterine ablation      There were no vitals filed for this visit.   Subjective Assessment - 07/20/18 0957    Subjective  Stroke on 06/14/2018 went to [facility]. Was discharged from [facility] about 1 wk later. Has been receiving HHPT until last week. Did have SLP in hospital and  home health. State nurse has come to the house to evaluate for respite care 2 hrs/day but hasn't started yet. Uses a walker with right hand support/wrist wrap. No complaints of pain. Doing all cooking, cleaning and laundry. Friend has been living with her since stroke. Patient feels she is constantly supervised and her movements around the  house are restricted. No falls since stroke.     Pertinent History  HTN ischemic stroke, COPD, GERD, lacunar stroke, hemi affecting rt side    Limitations  Walking;Standing;House hold activities    How long can you stand comfortably?  30 minutes    How long can you walk comfortably?  15-20 minutes    Patient Stated Goals  I want to get back to being independent and living alone.    Currently in Pain?  No/denies         [facility] PT Assessment - 07/20/18 0001      Assessment   Medical Diagnosis   CVA, unspecified mechanism     Referring Provider (PT)  Kirsteins, Luanna Salk, MD    Onset Date/Surgical Date  06/14/18    Hand Dominance  Right    Next MD Visit  08/20/2017    Prior Therapy  HHPT, HHSLP      Precautions   Precautions  None      Restrictions   Weight Bearing Restrictions  No      Balance Screen   Has the patient fallen in the past 6 months  No    Has the patient had a decrease in activity level because of a fear of falling?  Yes    Is the patient reluctant to leave their home because of a fear of falling?   Yes   mostly because she is unable to drive.     Home Environment   Living Environment  Private residence    Living Arrangements  Non-relatives/Friends    Type of [place] to enter    Entrance Stairs-Number of Steps  3      Prior Function   Level of Independence  Independent with household mobility with device;Needs assistance with homemaking    Driving  No      Cognition   Overall Cognitive Status  Within Functional Limits for tasks assessed      Observation/Other Assessments   Observations  Patient wearing right LE AFO      Coordination   Gross Motor Movements are Fluid and Coordinated  No   limited by weakness in RUE/RLE     ROM / Strength   AROM / PROM / Strength  Strength      Strength   Strength Assessment Site  Hip;Knee;Ankle    Right/Left Hip  Right;Left    Right Hip Flexion  4-/5    Right Hip Extension  3+/5     Right Hip ABduction  4-/5    Left Hip Flexion  4/5    Left Hip Extension  4/5    Left Hip ABduction  4/5    Right/Left Knee  Right;Left    Right Knee Flexion  4-/5    Right Knee Extension  4/5    Left Knee Flexion  4+/5    Left Knee Extension  5/5    Right/Left Ankle  Right;Left    Right Ankle Dorsiflexion  2/5    Left Ankle Dorsiflexion  4+/5      Flexibility   Soft Tissue Assessment /Muscle Length  yes    Hamstrings  moderate limitation in right LE      Transfers   Five time sit to stand comments   17.81 sec w/ minimal Lt UE assist on Lt thigh rep 3-5      Ambulation/Gait   Ambulation/Gait  Yes    Ambulation/Gait Assistance  5: Supervision    Ambulation/Gait Assistance Details  2MWT     Ambulation Distance (Feet)  160 Feet    Assistive device  Rolling walker   w/ right hand support/wrist wrap   Gait Pattern  Step-through pattern;Decreased stance time - right;Decreased stride length;Decreased hip/knee flexion - right;Decreased dorsiflexion - right;Decreased weight shift to right;Right flexed knee in stance;Trendelenburg;Poor foot clearance - right    Ambulation Surface  Level    Gait velocity  0.44 m/s      Balance   Balance Assessed  Yes      Static Standing Balance   Static Standing - Balance Support  No upper extremity supported    Static Standing - Level of Assistance  5: Stand by assistance    Static Standing Balance -  Activities   Single Leg Stance - Right Leg;Single Leg Stance - Left Leg    Static Standing - Comment/# of Minutes  right - 0 sec; left 6 sec (difficulty holding Rt LE off floor)                Objective measurements completed on examination: See above findings.              PT Education - 07/20/18 1041    Education Details  Examination findings and plan of care. Time and challenge to retrain nervous system. Initiated HEP. Asked patient to bring HHPT HEP next visit to review.    Person(s) Educated  Patient    Methods   Explanation;Demonstration;Handout    Comprehension  Verbalized understanding       PT Short Term Goals - 07/20/18 1056      PT SHORT TERM GOAL #1   Title  Patient will be independent with initial HEP and perform as directed to increase training and improve return to function.     Time  4    Period  Weeks    Status  New    Target Date  08/17/18      PT SHORT TERM GOAL #2   Title  Patient will be able to ambulate 50 ft > ( 0.58 m/s gait velocity) than inital evaluation of 2 MWT to indicate improve gait velocity, functional strength and balance and decrease risk for falls.     Baseline  inital - 160 feet (0.44 m/s) with RW and right hand support/wrist wrap    Time  4    Period  Weeks    Status  New      PT SHORT TERM GOAL #3   Title  Patient will exhibit 1/2 grade increased in muscle strength of rt LE to indicate improved muscle strength and control for functional activities to improve independent and safety.     Baseline  see MMT    Time  4    Period  Weeks    Status  New        PT Long Term Goals - 07/20/18 1110      PT LONG TERM GOAL #1   Title  Patient will be independent with initial HEP and perform as directed to increase training and improve return to function.     Time  8    Period  Weeks    Status  New    Target Date  09/14/18      PT LONG TERM GOAL #2   Title  Patient will be able to ambulate 288 ft or > ( 0.80 m/s gait velocity) than inital evaluation of 2 MWT to indicate improve gait velocity, functional strength and balance and decrease risk for falls.     Baseline  inital - 160 feet (0.44 m/s) with RW and right hand support/wrist wrap    Time  8    Period  Weeks    Status  New      PT LONG TERM GOAL #3   Title  Patient will exhibit 1/2 grade increased in muscle strength of rt LE to indicate improved muscle strength and control for functional activities to improve independent and safety.     Baseline  see MMT    Time  8    Period  Weeks    Status  New       PT LONG TERM GOAL #4   Title  Patient will be able to balance on either LE for 10 seconds or > to indicate improve functional strength and decreased risk for falls.     Baseline  initial - rt 0 sec; lt 6 sec    Time  8    Period  Weeks    Status  New             Plan - 07/20/18 1044    Clinical Impression Statement  Patient is a 57 year old female presenting to outpatient  physical therapy post ischemic lacunar stroke 06/14/2018 with residual right sided weakness. Patient current has a friend living with her since the stroke to assist with her daily needs. Patient is able to prep meals but doesn't attempt to carry pots or pans yet. Patient does have a somewhat slurred speech since stroke and has received SLP inpatient at home health. Patient not interested in further SLP at this time. Patient presents with decreased balance, decreased strength, decreased fucntional ability, but no complaints of pain at this time. Patient would benefit from physical therapy to address the aforementioned deficits and to improve her independence and QOL.  Patient would like to return to living independently so her friend doesn't have to stay with her any more. Patient currently unable to drive.     History and Personal Factors relevant to plan of care:  PMH: HTN ischemic stroke, COPD, GERD, lacunar stroke, hemi affecting rt side, h/o medication non-compliance    Clinical Presentation  Stable    Clinical Presentation due to:  balance, MMT, 5xSTS, 2MWT, clinical jedgement    Clinical Decision Making  Low    Rehab Potential  Good    Clinical Impairments Affecting Rehab Potential  positive - motivated to return to independent living; negative - tone in Rt LE affecting balance, gait and coordination    PT Frequency  2x / week    PT Duration  8 weeks    PT Treatment/Interventions  ADLs/Self Care Home Management;DME Instruction;Gait training;Stair training;Functional mobility training;Therapeutic  activities;Therapeutic exercise;Balance training;Neuromuscular re-education;Cognitive remediation;Patient/family education;Orthotic Fit/Training;Manual techniques;Joint Manipulations;Spinal Manipulations;Energy conservation;Dry needling;Aquatic Therapy    PT Next Visit Plan  review goals, eval, HEP, HHPT HEP if patient brings; initial bilateral LE strengthening and balance training beginning with tandem/feet together on solid surface; marching for Rt foot clearance with gait    PT Home Exercise Plan  initial - supine bridging    Consulted and Agree with Plan of Care  Patient       Patient will benefit from skilled therapeutic intervention in order to improve the following deficits and impairments:  Abnormal gait, Impaired tone, Decreased activity tolerance, Decreased strength, Impaired UE functional use, Decreased balance, Difficulty walking, Decreased coordination  Visit Diagnosis: Spastic hemiplegia affecting right dominant side, unspecified etiology (HCC)  Muscle weakness (generalized)  Other abnormalities of gait and mobility  Other symptoms and signs involving the musculoskeletal system     Problem List Patient Active Problem List   Diagnosis Date Noted  . Dental disease 07/02/2018  . Hemiparesis affecting right side as late effect of stroke ([place]) 06/17/2018  . Stroke (cerebrum) ([place]) 06/17/2018  . H/O medication noncompliance   . Chronic pain syndrome   . Leukocytosis   . Acute ischemic stroke ([place]) 06/13/2018  . Tobacco abuse 06/13/2018  . Lacunar stroke ([place]) 06/12/2018  . Hypertension   . COPD (chronic obstructive pulmonary disease) ([place])   . GERD (gastroesophageal reflux disease) 04/06/2012  . Abdominal pain 04/06/2012  . Nausea & vomiting 04/06/2012  . Melena 04/06/2012    Floria Raveling. Hartnett-Rands, MS, PT [facility] #[id number] 07/20/2018, 11:15 AM  [place] [address] [place], [place],  [address] Phone: [phone]   Fax:  [phone]  Name: DESTANY SEVERNS MRN: [id number] Date of Birth: [birth year]

## 2018-07-23 ENCOUNTER — Encounter (HOSPITAL_COMMUNITY): Payer: Self-pay | Admitting: Physical Therapy

## 2018-07-23 ENCOUNTER — Ambulatory Visit (HOSPITAL_COMMUNITY): Payer: Medicaid Other | Admitting: Physical Therapy

## 2018-07-23 ENCOUNTER — Other Ambulatory Visit: Payer: Self-pay

## 2018-07-23 DIAGNOSIS — R29898 Other symptoms and signs involving the musculoskeletal system: Secondary | ICD-10-CM

## 2018-07-23 DIAGNOSIS — I639 Cerebral infarction, unspecified: Secondary | ICD-10-CM

## 2018-07-23 DIAGNOSIS — R2689 Other abnormalities of gait and mobility: Secondary | ICD-10-CM

## 2018-07-23 DIAGNOSIS — G8111 Spastic hemiplegia affecting right dominant side: Secondary | ICD-10-CM | POA: Diagnosis not present

## 2018-07-23 DIAGNOSIS — M6281 Muscle weakness (generalized): Secondary | ICD-10-CM

## 2018-07-23 NOTE — Patient Instructions (Addendum)
Functional Quadriceps: Chair Squat    Keeping feet flat on floor, shoulder width apart, squat as low as is comfortable. Use support as necessary. Repeat __10__ times per set. Do _1___ sets per session. Do __2__ sessions per day.  http://orth.exer.us/736   Copyright  VHI. All rights reserved.  Functional Quadriceps: Sit to Stand    Sit on edge of chair, feet flat on floor. Stand upright, extending knees fully. Repeat __10__ times per set. Do _1___ sets per session. Do ___2_ sessions per day.  http://orth.exer.us/734   Copyright  VHI. All rights reserved.  Strengthening: Hip Abduction (Side-Lying)    Tighten muscles on front of left thigh, then lift leg ___10_ inches from surface, keeping knee locked.  Repeat __10__ times per set. Do 1____ sets per session. Do __2__ sessions per day.  http://orth.exer.us/622   Copyright  VHI. All rights reserved.  Strengthening: Straight Leg Raise (Phase 1)    Tighten muscles on front of right thigh, then lift leg __15__ inches from surface, keeping knee locked.  Repeat __10__ times per set. Do _1__ sets per session. Do __2__ sessions per day.  http://orth.exer.us/614   Copyright  VHI. All rights reserved.

## 2018-07-23 NOTE — Therapy (Signed)
[place], [place], [address] Phone: [phone]   Fax:  [phone]  Physical Therapy Treatment  Patient Details  Name: Judy Boyd MRN: [id number] Date of Birth: 02-15-1961 Referring Provider (PT): Letta Pate Luanna Salk, MD   Encounter Date: 07/23/2018  PT End of Session - 07/23/18 1537    Visit Number  2    Number of Visits  12    Date for PT Re-Evaluation  09/14/18    Authorization Type  Medicaid    Authorization Time Period  requesting 3 more visits 07/20/2018; next appt 07/23/2018; then request 12 additional visits for a total of 16 visits. 2xwk/8wks    Authorization - Visit Number  1    Authorization - Number of Visits  1    PT Start Time  7425    PT Stop Time  1518    PT Time Calculation (min)  40 min    Activity Tolerance  Patient tolerated treatment well    Behavior During Therapy  WFL for tasks assessed/performed       Past Medical History:  Diagnosis Date  . Anxiety   . Arthritis   . Asthma   . Bowel obstruction ([place])   . Chronic pain   . COPD (chronic obstructive pulmonary disease) ([place])   . Depression   . GERD (gastroesophageal reflux disease)   . Hypertension     Past Surgical History:  Procedure Laterality Date  . ABDOMINAL SURGERY  2003   blockage  . left salpingectomy    . uterine ablation      There were no vitals filed for this visit.  Subjective Assessment - 07/23/18 1444    Subjective  Pt states she is doing her exercises an has not questions.     Pertinent History  HTN ischemic stroke, COPD, GERD, lacunar stroke, hemi affecting rt side    Limitations  Walking;Standing;House hold activities    How long can you stand comfortably?  30 minutes    How long can you walk comfortably?  15-20 minutes    Patient Stated Goals  I want to get back to being independent and living alone.    Currently in Pain?  No/denies                       [facility] Adult PT  Treatment/Exercise - 07/23/18 0001      Exercises   Exercises  Knee/Hip      Knee/Hip Exercises: Seated   Long Arc Quad  Strengthening;Right;10 reps;Weights    Long Arc Quad Weight  4 lbs.      Knee/Hip Exercises: Supine   Bridges with Ball Squeeze  10 reps    Straight Leg Raises  Right;10 reps      Knee/Hip Exercises: Sidelying   Hip ABduction  Right;10 reps      Knee/Hip Exercises: Prone   Hamstring Curl  10 reps    Hip Extension  Strengthening;Right    Hip Extension Limitations  knee bent to 90 degrees           Balance Exercises - 07/23/18 1444      Balance Exercises: Standing   Tandem Stance  Eyes open;3 reps    Sidestepping  2 reps;Theraband   green   Marching Limitations  10    Sit to Stand Time  10          PT Short Term Goals - 07/23/18 1443  PT SHORT TERM GOAL #1   Title  Patient will be independent with initial HEP and perform as directed to increase training and improve return to function.     Time  4    Period  Weeks    Status  On-going      PT SHORT TERM GOAL #2   Title  Patient will be able to ambulate 50 ft > ( 0.58 m/s gait velocity) than inital evaluation of 2 MWT to indicate improve gait velocity, functional strength and balance and decrease risk for falls.     Baseline  inital - 160 feet (0.44 m/s) with RW and right hand support/wrist wrap    Time  4    Period  Weeks    Status  On-going      PT SHORT TERM GOAL #3   Title  Patient will exhibit 1/2 grade increased in muscle strength of rt LE to indicate improved muscle strength and control for functional activities to improve independent and safety.     Baseline  see MMT    Time  4    Period  Weeks    Status  On-going        PT Long Term Goals - 07/23/18 1443      PT LONG TERM GOAL #1   Title  Patient will be independent with initial HEP and perform as directed to increase training and improve return to function.     Time  8    Period  Weeks    Status  On-going      PT  LONG TERM GOAL #2   Title  Patient will be able to ambulate 288 ft or > ( 0.80 m/s gait velocity) than inital evaluation of 2 MWT to indicate improve gait velocity, functional strength and balance and decrease risk for falls.     Baseline  inital - 160 feet (0.44 m/s) with RW and right hand support/wrist wrap    Time  8    Period  Weeks    Status  On-going      PT LONG TERM GOAL #3   Title  Patient will exhibit 1 grade increased in muscle strength of rt LE to indicate improved muscle strength and control for functional activities to improve independent and safety.     Baseline  see MMT    Time  8    Period  Weeks    Status  On-going      PT LONG TERM GOAL #4   Title  Patient will be able to balance on either LE for 10 seconds or > to indicate improve functional strength and decreased risk for falls.     Baseline  initial - rt 0 sec; lt 6 sec    Time  8    Period  Weeks    Status  On-going            Plan - 07/23/18 1537    Clinical Impression Statement  Evaluation and goal reviewed with pt.  Pt tends to lose balance to the right with balance activity.  HEP updated with pt verbalizing understanding. Pt will continue to benefit from skilled PT to maximize her independence.     Rehab Potential  Good    Clinical Impairments Affecting Rehab Potential  positive - motivated to return to independent living; negative - tone in Rt LE affecting balance, gait and coordination    PT Frequency  2x / week    PT Duration  8  weeks    PT Treatment/Interventions  ADLs/Self Care Home Management;DME Instruction;Gait training;Stair training;Functional mobility training;Therapeutic activities;Therapeutic exercise;Balance training;Neuromuscular re-education;Cognitive remediation;Patient/family education;Orthotic Fit/Training;Manual techniques;Joint Manipulations;Spinal Manipulations;Energy conservation;Dry needling;Aquatic Therapy    PT Next Visit Plan   continue LE strengthening and balance training      PT Home Exercise Plan  initial - supine bridging    Consulted and Agree with Plan of Care  Patient       Patient will benefit from skilled therapeutic intervention in order to improve the following deficits and impairments:  Abnormal gait, Impaired tone, Decreased activity tolerance, Decreased strength, Impaired UE functional use, Decreased balance, Difficulty walking, Decreased coordination  Visit Diagnosis: Spastic hemiplegia affecting right dominant side, unspecified etiology (HCC)  Muscle weakness (generalized)  Other abnormalities of gait and mobility  Other symptoms and signs involving the musculoskeletal system  Acute ischemic stroke [facility])     Problem List Patient Active Problem List   Diagnosis Date Noted  . Dental disease 07/02/2018  . Hemiparesis affecting right side as late effect of stroke ([place]) 06/17/2018  . Stroke (cerebrum) ([place]) 06/17/2018  . H/O medication noncompliance   . Chronic pain syndrome   . Leukocytosis   . Acute ischemic stroke ([place]) 06/13/2018  . Tobacco abuse 06/13/2018  . Lacunar stroke ([place]) 06/12/2018  . Hypertension   . COPD (chronic obstructive pulmonary disease) ([place])   . GERD (gastroesophageal reflux disease) 04/06/2012  . Abdominal pain 04/06/2012  . Nausea & vomiting 04/06/2012  . Melena 04/06/2012   Rayetta Humphrey, PT CLT [phone] 07/23/2018, 3:41 PM  [place] [address] [place], [place], [address] Phone: [phone]   Fax:  [phone]  Name: MILES BORKOWSKI MRN: [id number] Date of Birth: [birth year]

## 2018-07-27 ENCOUNTER — Ambulatory Visit (HOSPITAL_COMMUNITY): Payer: Medicaid Other

## 2018-07-27 ENCOUNTER — Encounter (HOSPITAL_COMMUNITY): Payer: Self-pay

## 2018-07-27 DIAGNOSIS — R29898 Other symptoms and signs involving the musculoskeletal system: Secondary | ICD-10-CM

## 2018-07-27 DIAGNOSIS — M6281 Muscle weakness (generalized): Secondary | ICD-10-CM

## 2018-07-27 DIAGNOSIS — G8111 Spastic hemiplegia affecting right dominant side: Secondary | ICD-10-CM

## 2018-07-27 DIAGNOSIS — R2689 Other abnormalities of gait and mobility: Secondary | ICD-10-CM

## 2018-07-27 NOTE — Therapy (Signed)
[place], [place], [address] Phone: [phone]   Fax:  [phone]  Physical Therapy Treatment  Patient Details  Name: Judy Boyd MRN: [id number] Date of Birth: 02-Oct-1960 Referring Provider (PT): Letta Pate Luanna Salk, MD   Encounter Date: 07/27/2018  PT End of Session - 07/27/18 1437    Visit Number  3    Number of Visits  12    Date for PT Re-Evaluation  09/14/18    Authorization Type  Medicaid approval 4 sessions 12/06-->08/05/18    Authorization Time Period  Request 12 additional visits for total of 16 visits, 2x/ week/8 weeks    Authorization - Visit Number  2    Authorization - Number of Visits  4    PT Start Time  6063    PT Stop Time  1512    PT Time Calculation (min)  41 min    Equipment Utilized During Treatment  Gait belt    Activity Tolerance  Patient tolerated treatment well    Behavior During Therapy  [facility] for tasks assessed/performed       Past Medical History:  Diagnosis Date  . Anxiety   . Arthritis   . Asthma   . Bowel obstruction ([place])   . Chronic pain   . COPD (chronic obstructive pulmonary disease) ([place])   . Depression   . GERD (gastroesophageal reflux disease)   . Hypertension     Past Surgical History:  Procedure Laterality Date  . ABDOMINAL SURGERY  2003   blockage  . left salpingectomy    . uterine ablation      There were no vitals filed for this visit.  Subjective Assessment - 07/27/18 1434    Subjective  Pt reports compliance with HEP about 2 times a week.  No reoprts of pain currently    Pertinent History  HTN ischemic stroke, COPD, GERD, lacunar stroke, hemi affecting rt side    Patient Stated Goals  I want to get back to being independent and living alone.    Currently in Pain?  No/denies                       [facility] Adult PT Treatment/Exercise - 07/27/18 0001      Exercises   Exercises  Knee/Hip      Knee/Hip Exercises: Supine    Bridges with Diona Foley Squeeze  2 sets;10 reps    Straight Leg Raises  Right;10 reps      Knee/Hip Exercises: Sidelying   Hip ABduction  Both;10 reps    Clams  RTB 10x5" holds      Knee/Hip Exercises: Prone   Hamstring Curl  10 reps    Hip Extension  Strengthening;Right    Hip Extension Limitations  knee bent to 90 degrees     Other Prone Exercises  Quadruped weight shifting and UE flexion 5x each          Balance Exercises - 07/27/18 1457      Balance Exercises: Standing   Tandem Stance  Eyes open;2 reps;Foam/compliant surface;30 secs   2nd set on foam   Rockerboard  Lateral;UE support   2 min   Sidestepping  2 reps;Theraband   front of mat   Marching Limitations  10 x 5" holds'    Heel Raises Limitations  10x    Sit to Stand Time  10 no HHA          PT Short Term  Goals - 07/23/18 1443      PT SHORT TERM GOAL #1   Title  Patient will be independent with initial HEP and perform as directed to increase training and improve return to function.     Time  4    Period  Weeks    Status  On-going      PT SHORT TERM GOAL #2   Title  Patient will be able to ambulate 50 ft > ( 0.58 m/s gait velocity) than inital evaluation of 2 MWT to indicate improve gait velocity, functional strength and balance and decrease risk for falls.     Baseline  inital - 160 feet (0.44 m/s) with RW and right hand support/wrist wrap    Time  4    Period  Weeks    Status  On-going      PT SHORT TERM GOAL #3   Title  Patient will exhibit 1/2 grade increased in muscle strength of rt LE to indicate improved muscle strength and control for functional activities to improve independent and safety.     Baseline  see MMT    Time  4    Period  Weeks    Status  On-going        PT Long Term Goals - 07/23/18 1443      PT LONG TERM GOAL #1   Title  Patient will be independent with initial HEP and perform as directed to increase training and improve return to function.     Time  8    Period  Weeks     Status  On-going      PT LONG TERM GOAL #2   Title  Patient will be able to ambulate 288 ft or > ( 0.80 m/s gait velocity) than inital evaluation of 2 MWT to indicate improve gait velocity, functional strength and balance and decrease risk for falls.     Baseline  inital - 160 feet (0.44 m/s) with RW and right hand support/wrist wrap    Time  8    Period  Weeks    Status  On-going      PT LONG TERM GOAL #3   Title  Patient will exhibit 1 grade increased in muscle strength of rt LE to indicate improved muscle strength and control for functional activities to improve independent and safety.     Baseline  see MMT    Time  8    Period  Weeks    Status  On-going      PT LONG TERM GOAL #4   Title  Patient will be able to balance on either LE for 10 seconds or > to indicate improve functional strength and decreased risk for falls.     Baseline  initial - rt 0 sec; lt 6 sec    Time  8    Period  Weeks    Status  On-going            Plan - 07/27/18 1533    Clinical Impression Statement  Reviewed form wiht HEP, pt stated she has been compliant about 2 times a week.  Progressed core, hip and balance activites with quadruped activities, noted decreased mobility Rt UE wrist and elbow with manual assistance wiht deficits.  Added clams as well for hip strengthening and rockerboard for weight distribution.  No pain through session, was limited by fatigue.      Rehab Potential  Good    Clinical Impairments Affecting Rehab Potential  positive -  motivated to return to independent living; negative - tone in Rt LE affecting balance, gait and coordination    PT Frequency  2x / week    PT Duration  8 weeks    PT Treatment/Interventions  ADLs/Self Care Home Management;DME Instruction;Gait training;Stair training;Functional mobility training;Therapeutic activities;Therapeutic exercise;Balance training;Neuromuscular re-education;Cognitive remediation;Patient/family education;Orthotic Fit/Training;Manual  techniques;Joint Manipulations;Spinal Manipulations;Energy conservation;Dry needling;Aquatic Therapy    PT Next Visit Plan   continue LE strengthening and balance training     PT Home Exercise Plan  initial - supine bridging       Patient will benefit from skilled therapeutic intervention in order to improve the following deficits and impairments:  Abnormal gait, Impaired tone, Decreased activity tolerance, Decreased strength, Impaired UE functional use, Decreased balance, Difficulty walking, Decreased coordination  Visit Diagnosis: Spastic hemiplegia affecting right dominant side, unspecified etiology (HCC)  Muscle weakness (generalized)  Other abnormalities of gait and mobility  Other symptoms and signs involving the musculoskeletal system     Problem List Patient Active Problem List   Diagnosis Date Noted  . Dental disease 07/02/2018  . Hemiparesis affecting right side as late effect of stroke ([place]) 06/17/2018  . Stroke (cerebrum) ([place]) 06/17/2018  . H/O medication noncompliance   . Chronic pain syndrome   . Leukocytosis   . Acute ischemic stroke ([place]) 06/13/2018  . Tobacco abuse 06/13/2018  . Lacunar stroke ([place]) 06/12/2018  . Hypertension   . COPD (chronic obstructive pulmonary disease) ([place])   . GERD (gastroesophageal reflux disease) 04/06/2012  . Abdominal pain 04/06/2012  . Nausea & vomiting 04/06/2012  . Melena 04/06/2012   Ihor Austin, [place]; [place]  Aldona Lento 07/27/2018, [place], [place], [address] Phone: [phone]   Fax:  [phone]  Name: Judy Boyd MRN: [id number] Date of Birth: 1960/10/27

## 2018-07-29 ENCOUNTER — Ambulatory Visit (HOSPITAL_COMMUNITY): Payer: Medicaid Other

## 2018-07-29 ENCOUNTER — Encounter (HOSPITAL_COMMUNITY): Payer: Self-pay

## 2018-07-29 DIAGNOSIS — R29898 Other symptoms and signs involving the musculoskeletal system: Secondary | ICD-10-CM

## 2018-07-29 DIAGNOSIS — M6281 Muscle weakness (generalized): Secondary | ICD-10-CM

## 2018-07-29 DIAGNOSIS — R2689 Other abnormalities of gait and mobility: Secondary | ICD-10-CM

## 2018-07-29 DIAGNOSIS — G8111 Spastic hemiplegia affecting right dominant side: Secondary | ICD-10-CM | POA: Diagnosis not present

## 2018-07-29 NOTE — Therapy (Signed)
[place], [place], [address] Phone: [phone]   Fax:  [phone]  Physical Therapy Treatment  Patient Details  Name: Judy Boyd MRN: [id number] Date of Birth: 11-14-1960 Referring Provider (PT): Letta Pate Luanna Salk, MD   Encounter Date: 07/29/2018  PT End of Session - 07/29/18 1457    Visit Number  4    Number of Visits  12    Date for PT Re-Evaluation  09/14/18    Authorization Type  Medicaid approval 4 sessions 12/06-->08/05/18    Authorization Time Period  Request 12 additional visits for total of 16 visits, 2x/ week/8 weeks    Authorization - Visit Number  3    Authorization - Number of Visits  4    PT Start Time  8676    PT Stop Time  1515    PT Time Calculation (min)  41 min    Equipment Utilized During Treatment  Gait belt    Activity Tolerance  Patient tolerated treatment well    Behavior During Therapy  [facility] for tasks assessed/performed       Past Medical History:  Diagnosis Date  . Anxiety   . Arthritis   . Asthma   . Bowel obstruction ([place])   . Chronic pain   . COPD (chronic obstructive pulmonary disease) ([place])   . Depression   . GERD (gastroesophageal reflux disease)   . Hypertension     Past Surgical History:  Procedure Laterality Date  . ABDOMINAL SURGERY  2003   blockage  . left salpingectomy    . uterine ablation      There were no vitals filed for this visit.  Subjective Assessment - 07/29/18 1429    Subjective  Pt stated she feels good today, trying to walk as much as possible around the home instead of riding in wheel chair    Pertinent History  HTN ischemic stroke, COPD, GERD, lacunar stroke, hemi affecting rt side    Patient Stated Goals  I want to get back to being independent and living alone.    Currently in Pain?  No/denies                       [facility] Adult PT Treatment/Exercise - 07/29/18 0001      Ambulation/Gait   Ambulation/Gait  Yes     Ambulation/Gait Assistance  5: Supervision    Ambulation Distance (Feet)  226 Feet    Assistive device  Small based quad cane    Gait Pattern  Step-through pattern;Decreased stance time - right;Decreased stride length;Decreased hip/knee flexion - right;Decreased dorsiflexion - right;Decreased weight shift to right;Right flexed knee in stance;Trendelenburg;Poor foot clearance - right    Gait Comments  cueing to bend Rt knee for toe clearance      Knee/Hip Exercises: Standing   Knee Flexion  AAROM;10 reps;Right    Functional Squat  10 reps    Functional Squat Limitations  cueing for mechanics       Knee/Hip Exercises: Prone   Hamstring Curl  10 reps    Hamstring Curl Limitations  AAROM wiht Rt    Hip Extension  Strengthening;Both;15 reps    Hip Extension Limitations  knee bent to 90 degrees     Other Prone Exercises  Quadruped weight shifting and UE flexion 10x each    Other Prone Exercises  Crawling forward/backwards 2x on mat; tall kneeling catch/throw with pink ball  Balance Exercises - 07/29/18 1549      Balance Exercises: Standing   Tandem Stance  Eyes open;2 reps;Foam/compliant surface;30 secs    Sidestepping  2 reps;Theraband   GTB infront of mat   Sit to Stand Time  15x no HHA          PT Short Term Goals - 07/23/18 1443      PT SHORT TERM GOAL #1   Title  Patient will be independent with initial HEP and perform as directed to increase training and improve return to function.     Time  4    Period  Weeks    Status  On-going      PT SHORT TERM GOAL #2   Title  Patient will be able to ambulate 50 ft > ( 0.58 m/s gait velocity) than inital evaluation of 2 MWT to indicate improve gait velocity, functional strength and balance and decrease risk for falls.     Baseline  inital - 160 feet (0.44 m/s) with RW and right hand support/wrist wrap    Time  4    Period  Weeks    Status  On-going      PT SHORT TERM GOAL #3   Title  Patient will exhibit 1/2 grade  increased in muscle strength of rt LE to indicate improved muscle strength and control for functional activities to improve independent and safety.     Baseline  see MMT    Time  4    Period  Weeks    Status  On-going        PT Long Term Goals - 07/23/18 1443      PT LONG TERM GOAL #1   Title  Patient will be independent with initial HEP and perform as directed to increase training and improve return to function.     Time  8    Period  Weeks    Status  On-going      PT LONG TERM GOAL #2   Title  Patient will be able to ambulate 288 ft or > ( 0.80 m/s gait velocity) than inital evaluation of 2 MWT to indicate improve gait velocity, functional strength and balance and decrease risk for falls.     Baseline  inital - 160 feet (0.44 m/s) with RW and right hand support/wrist wrap    Time  8    Period  Weeks    Status  On-going      PT LONG TERM GOAL #3   Title  Patient will exhibit 1 grade increased in muscle strength of rt LE to indicate improved muscle strength and control for functional activities to improve independent and safety.     Baseline  see MMT    Time  8    Period  Weeks    Status  On-going      PT LONG TERM GOAL #4   Title  Patient will be able to balance on either LE for 10 seconds or > to indicate improve functional strength and decreased risk for falls.     Baseline  initial - rt 0 sec; lt 6 sec    Time  8    Period  Weeks    Status  On-going            Plan - 07/29/18 1551    Clinical Impression Statement  Session focus on core/LE strengthening, balance and gait training with LRAD.  Continued quadruped activities and began crawling and tall kneeling activities  for core strengthening and balalnce training.  Also added functional strenghtening exercises wiht squats and AAROM hamstring curls in standing and prone.  EOS with gait trianing with QC, initial cueing for sequence that pt was able to demonstrate without cueing required.  Did require cueing for equal  stride length with decreased Lt LE during Rt WB.  No reports of pain through session, was limited with certain positions and Rt UE in prone.    Rehab Potential  Good    Clinical Impairments Affecting Rehab Potential  positive - motivated to return to independent living; negative - tone in Rt LE affecting balance, gait and coordination    PT Frequency  2x / week    PT Duration  8 weeks    PT Treatment/Interventions  ADLs/Self Care Home Management;DME Instruction;Gait training;Stair training;Functional mobility training;Therapeutic activities;Therapeutic exercise;Balance training;Neuromuscular re-education;Cognitive remediation;Patient/family education;Orthotic Fit/Training;Manual techniques;Joint Manipulations;Spinal Manipulations;Energy conservation;Dry needling;Aquatic Therapy    PT Next Visit Plan  Continue QC gait, quadruped and tall kneeling activities, LE strengthening and balance training.      PT Home Exercise Plan  initial - supine bridging       Patient will benefit from skilled therapeutic intervention in order to improve the following deficits and impairments:  Abnormal gait, Impaired tone, Decreased activity tolerance, Decreased strength, Impaired UE functional use, Decreased balance, Difficulty walking, Decreased coordination  Visit Diagnosis: Spastic hemiplegia affecting right dominant side, unspecified etiology (HCC)  Muscle weakness (generalized)  Other abnormalities of gait and mobility  Other symptoms and signs involving the musculoskeletal system     Problem List Patient Active Problem List   Diagnosis Date Noted  . Dental disease 07/02/2018  . Hemiparesis affecting right side as late effect of stroke ([place]) 06/17/2018  . Stroke (cerebrum) ([place]) 06/17/2018  . H/O medication noncompliance   . Chronic pain syndrome   . Leukocytosis   . Acute ischemic stroke ([place]) 06/13/2018  . Tobacco abuse 06/13/2018  . Lacunar stroke ([place]) 06/12/2018  . Hypertension   . COPD  (chronic obstructive pulmonary disease) ([place])   . GERD (gastroesophageal reflux disease) 04/06/2012  . Abdominal pain 04/06/2012  . Nausea & vomiting 04/06/2012  . Melena 04/06/2012   Ihor Austin, [place]; [place]  Aldona Lento 07/29/2018, 4:01 PM  [place], [place], [address] Phone: [phone]   Fax:  [phone]  Name: Judy Boyd MRN: [id number] Date of Birth: [birth year]

## 2018-08-02 ENCOUNTER — Ambulatory Visit (HOSPITAL_COMMUNITY): Payer: Medicaid Other | Admitting: Physical Therapy

## 2018-08-02 DIAGNOSIS — R2689 Other abnormalities of gait and mobility: Secondary | ICD-10-CM

## 2018-08-02 DIAGNOSIS — G8111 Spastic hemiplegia affecting right dominant side: Secondary | ICD-10-CM

## 2018-08-02 DIAGNOSIS — M6281 Muscle weakness (generalized): Secondary | ICD-10-CM

## 2018-08-02 DIAGNOSIS — R29898 Other symptoms and signs involving the musculoskeletal system: Secondary | ICD-10-CM

## 2018-08-02 DIAGNOSIS — I639 Cerebral infarction, unspecified: Secondary | ICD-10-CM

## 2018-08-02 NOTE — Therapy (Signed)
[place], [place], [address] Phone: [phone]   Fax:  [phone]  Physical Therapy Treatment  Patient Details  Name: Judy Boyd MRN: [id number] Date of Birth: [birth year] Referring Provider (PT): Letta Pate Luanna Salk, MD   Encounter Date: 08/02/2018  PT End of Session - 08/02/18 1739    Visit Number  5    Number of Visits  12    Date for PT Re-Evaluation  09/14/18    Authorization Type  Medicaid approval 4 sessions 12/06-->08/05/18    Authorization Time Period  Request 12 additional visits for total of 16 visits, 2x/ week/8 weeks    Authorization - Visit Number  4    Authorization - Number of Visits  4    PT Start Time  1600    PT Stop Time  1645    PT Time Calculation (min)  45 min    Equipment Utilized During Treatment  Gait belt    Activity Tolerance  Patient tolerated treatment well    Behavior During Therapy  [facility] for tasks assessed/performed       Past Medical History:  Diagnosis Date  . Anxiety   . Arthritis   . Asthma   . Bowel obstruction ([place])   . Chronic pain   . COPD (chronic obstructive pulmonary disease) ([place])   . Depression   . GERD (gastroesophageal reflux disease)   . Hypertension     Past Surgical History:  Procedure Laterality Date  . ABDOMINAL SURGERY  2003   blockage  . left salpingectomy    . uterine ablation      There were no vitals filed for this visit.  Subjective Assessment - 08/02/18 1747    Subjective  pt states she changed her shoes and her AFO doesn't fit in her shoe right (went from size 8.5 shoe to 9).  Pt is eager to begin driving.  no pain or issues.    Currently in Pain?  No/denies         [facility] PT Assessment - 08/02/18 0001      Assessment   Medical Diagnosis   CVA, unspecified mechanism     Referring Provider (PT)  Charlett Blake, MD    Onset Date/Surgical Date  06/14/18    Hand Dominance  Right    Next MD Visit  08/20/2017    Prior  Therapy  HHPT, HHSLP      Precautions   Precautions  None      Restrictions   Weight Bearing Restrictions  No      Observation/Other Assessments   Observations  Patient wearing right LE AFO      Strength   Right Hip Flexion  4+/5   was 4-/5   Right Hip Extension  3+/5   was 3+/5   Right Hip ABduction  4/5   was 4-/5   Left Hip Flexion  5/5   was 4/5   Left Hip Extension  4/5   was 4/5   Left Hip ABduction  4+/5   was 4/5   Right Knee Flexion  3/5   was 4-/5   Right Knee Extension  4/5   was 4/5   Left Knee Flexion  4+/5   was 4+/5   Left Knee Extension  5/5   was 5/5   Right Ankle Dorsiflexion  3+/5   was 2/5   Left Ankle Dorsiflexion  4+/5   was 4+/5     Transfers  Five time sit to stand comments   15.31 no UE assist, standard chair   was 18.81 with Lt UE assistance     Ambulation/Gait   Ambulation/Gait  Yes    Ambulation/Gait Assistance  5: Supervision    Ambulation/Gait Assistance Details  2MWT    Ambulation Distance (Feet)  176 Feet   was 160 at evaluation   Assistive device  Small based quad cane    Gait Pattern  Step-through pattern;Decreased stance time - right;Decreased stride length;Decreased hip/knee flexion - right;Decreased dorsiflexion - right;Decreased weight shift to right;Right flexed knee in stance;Trendelenburg;Poor foot clearance - right      Static Standing Balance   Static Standing - Balance Support  No upper extremity supported    Static Standing - Level of Assistance  5: Stand by assistance    Static Standing Balance -  Activities   Single Leg Stance - Right Leg;Single Leg Stance - Left Leg    Static Standing - Comment/# of Minutes  Rt: 0" (was 0"0, Lt: 30" (was 6")                        Balance Exercises - 08/02/18 1632      Balance Exercises: Standing   SLS with Vectors  Upper extremity assist 1;Time   Lt 5" holds 10 reps         PT Short Term Goals - 08/02/18 1629      PT SHORT TERM GOAL #1   Title   Patient will be independent with initial HEP and perform as directed to increase training and improve return to function.     Time  4    Period  Weeks    Status  On-going      PT SHORT TERM GOAL #2   Title  Patient will be able to ambulate 50 ft > ( 0.58 m/s gait velocity) than inital evaluation of 2 MWT to indicate improve gait velocity, functional strength and balance and decrease risk for falls.     Baseline  inital - 160 feet (0.44 m/s) with RW and right hand support/wrist wrap    Time  4    Period  Weeks    Status  On-going      PT SHORT TERM GOAL #3   Title  Patient will exhibit 1/2 grade increased in muscle strength of rt LE to indicate improved muscle strength and control for functional activities to improve independent and safety.     Baseline  see MMT    Time  4    Period  Weeks    Status  Partially Met        PT Long Term Goals - 08/02/18 1630      PT LONG TERM GOAL #1   Title  Patient will be independent with advanced HEP and perform as directed to increase training and improve return to function.     Time  8    Period  Weeks    Status  On-going      PT LONG TERM GOAL #2   Title  Patient will be able to ambulate 288 ft or > ( 0.80 m/s gait velocity) than inital evaluation of 2 MWT to indicate improve gait velocity, functional strength and balance and decrease risk for falls.     Baseline  inital - 160 feet (0.44 m/s) with RW and right hand support/wrist wrap    Time  8    Period  Weeks  Status  On-going      PT LONG TERM GOAL #3   Title  Patient will exhibit 1 grade increased in muscle strength of rt LE to indicate improved muscle strength and control for functional activities to improve independent and safety.     Baseline  see MMT    Time  8    Period  Weeks    Status  On-going      PT LONG TERM GOAL #4   Title  Patient will be able to balance on either LE for 10 seconds or > to indicate improve functional strength and decreased risk for falls.      Baseline  initial - rt 0 sec; lt 6 sec    Time  8    Period  Weeks    Status  On-going            Plan - 08/02/18 1739    Clinical Impression Statement  Pt reassessed today per 4 visit insurance approval.  Pt has met 1/3 STG's and is progressing towards LTG's.  Noted improvements include increased sit to stand time, increased strength in Rt DF, bilateral hip flexors and abductors.  Pt's Rt knee flexor remains most compromised, thus making her ambulation unstable with hyperextension of Rt knee.  Rt DF strength has improved to allow adequate clearance with ambulation without using AFO.  Pt does continue to have lateral instability in ankle that benefits from use of AFO.  Ambulation time did decrease today, however may be due to pateint changing her shoes and AFO mvoing around in it.  Recommended she go back down to size 8.5 for correct fit.  PT verbalized understanding.  Pt will continue to benefit from continued therapy to increase weakened mm, improve stabiliy and increase independence.    Rehab Potential  Good    Clinical Impairments Affecting Rehab Potential  positive - motivated to return to independent living; negative - tone in Rt LE affecting balance, gait and coordination    PT Frequency  2x / week    PT Duration  8 weeks    PT Treatment/Interventions  ADLs/Self Care Home Management;DME Instruction;Gait training;Stair training;Functional mobility training;Therapeutic activities;Therapeutic exercise;Balance training;Neuromuscular re-education;Cognitive remediation;Patient/family education;Orthotic Fit/Training;Manual techniques;Joint Manipulations;Spinal Manipulations;Energy conservation;Dry needling;Aquatic Therapy    PT Next Visit Plan  Continue QC gait, quadruped and tall kneeling activities.  Focus on standing exercises/gait outside of AFO, Rt hamstring strengthening.  Follow up on order for orthotic consult sent to MD.     PT Home Exercise Plan  initial - supine bridging        Patient will benefit from skilled therapeutic intervention in order to improve the following deficits and impairments:  Abnormal gait, Impaired tone, Decreased activity tolerance, Decreased strength, Impaired UE functional use, Decreased balance, Difficulty walking, Decreased coordination  Visit Diagnosis: Spastic hemiplegia affecting right dominant side, unspecified etiology (HCC)  Muscle weakness (generalized)  Other abnormalities of gait and mobility  Other symptoms and signs involving the musculoskeletal system  Acute ischemic stroke [facility])     Problem List Patient Active Problem List   Diagnosis Date Noted  . Dental disease 07/02/2018  . Hemiparesis affecting right side as late effect of stroke ([place]) 06/17/2018  . Stroke (cerebrum) ([place]) 06/17/2018  . H/O medication noncompliance   . Chronic pain syndrome   . Leukocytosis   . Acute ischemic stroke ([place]) 06/13/2018  . Tobacco abuse 06/13/2018  . Lacunar stroke ([place]) 06/12/2018  . Hypertension   . COPD (chronic obstructive pulmonary disease) (  [place])   . GERD (gastroesophageal reflux disease) 04/06/2012  . Abdominal pain 04/06/2012  . Nausea & vomiting 04/06/2012  . Melena 04/06/2012   Teena Irani, PTA/CLT [phone]  Teena Irani 08/02/2018, 5:48 PM  [facility] [address] [place], [place], [address] Phone: [phone]   Fax:  [phone]  Name: ALBANY WINSLOW MRN: [id number] Date of Birth: [birth year]

## 2018-08-05 ENCOUNTER — Ambulatory Visit (HOSPITAL_COMMUNITY): Payer: Medicaid Other | Admitting: Physical Therapy

## 2018-08-05 ENCOUNTER — Ambulatory Visit (HOSPITAL_COMMUNITY): Payer: Self-pay | Admitting: Physical Therapy

## 2018-08-05 DIAGNOSIS — R2689 Other abnormalities of gait and mobility: Secondary | ICD-10-CM

## 2018-08-05 DIAGNOSIS — R29898 Other symptoms and signs involving the musculoskeletal system: Secondary | ICD-10-CM

## 2018-08-05 NOTE — Therapy (Signed)
[place], [place], [address] Phone: [phone]   Fax:  [phone]  Physical Therapy Treatment  Patient Details  Name: Judy Boyd MRN: [id number] Date of Birth: 02/15/1961 Referring Provider (PT): Letta Pate Luanna Salk, MD   Encounter Date: 08/05/2018  PT End of Session - 08/05/18 1556    Visit Number  6    Number of Visits  12    Date for PT Re-Evaluation  09/14/18    Authorization Type  Medicaid approval 4 sessions 12/06-->08/05/18, approved 3 sessions 08/06/18-08/17/18    Authorization - Visit Number  0    Authorization - Number of Visits  3    PT Start Time  6962    PT Stop Time  1520    PT Time Calculation (min)  42 min    Equipment Utilized During Treatment  Gait belt    Activity Tolerance  Patient tolerated treatment well    Behavior During Therapy  [facility] for tasks assessed/performed       Past Medical History:  Diagnosis Date  . Anxiety   . Arthritis   . Asthma   . Bowel obstruction ([place])   . Chronic pain   . COPD (chronic obstructive pulmonary disease) ([place])   . Depression   . GERD (gastroesophageal reflux disease)   . Hypertension     Past Surgical History:  Procedure Laterality Date  . ABDOMINAL SURGERY  2003   blockage  . left salpingectomy    . uterine ablation      There were no vitals filed for this visit.  Subjective Assessment - 08/05/18 1441    Subjective  Pt states she changed her shoes again and the AFO still doesnt fit right in her shoe.  Statess she's only hurting across her shoulders.     Currently in Pain?  No/denies                       [facility] Adult PT Treatment/Exercise - 08/05/18 0001      Ambulation/Gait   Gait Comments  200 feet with QC out AFO      Knee/Hip Exercises: Standing   Heel Raises  Both;10 reps;2 sets;Limitations    Heel Raises Limitations  AFO off    Knee Flexion  AAROM;10 reps;Right    Knee Flexion Limitations  from 4"  step with AFO off    Forward Lunges  Right;15 reps    Forward Lunges Limitations  onto 4" step AFO off    Functional Squat  10 reps    Functional Squat Limitations  cueing for mechanics       Knee/Hip Exercises: Seated   Long Arc Quad  Strengthening;Right;10 reps;Weights;2 sets    Long Arc Quad Weight  4 lbs.    Other Seated Knee/Hip Exercises  ankle dorsiflexion 2X10 AFO off    Other Seated Knee/Hip Exercises  ankle windshield wipers forefoot on towel, stabilzing heel 10 reps X 2 AFO off               PT Short Term Goals - 08/02/18 1629      PT SHORT TERM GOAL #1   Title  Patient will be independent with initial HEP and perform as directed to increase training and improve return to function.     Time  4    Period  Weeks    Status  On-going      PT SHORT TERM GOAL #2   Title  Patient will be able to ambulate 50 ft > ( 0.58 m/s gait velocity) than inital evaluation of 2 MWT to indicate improve gait velocity, functional strength and balance and decrease risk for falls.     Baseline  inital - 160 feet (0.44 m/s) with RW and right hand support/wrist wrap    Time  4    Period  Weeks    Status  On-going      PT SHORT TERM GOAL #3   Title  Patient will exhibit 1/2 grade increased in muscle strength of rt LE to indicate improved muscle strength and control for functional activities to improve independent and safety.     Baseline  see MMT    Time  4    Period  Weeks    Status  Partially Met        PT Long Term Goals - 08/02/18 1630      PT LONG TERM GOAL #1   Title  Patient will be independent with advanced HEP and perform as directed to increase training and improve return to function.     Time  8    Period  Weeks    Status  On-going      PT LONG TERM GOAL #2   Title  Patient will be able to ambulate 288 ft or > ( 0.80 m/s gait velocity) than inital evaluation of 2 MWT to indicate improve gait velocity, functional strength and balance and decrease risk for falls.      Baseline  inital - 160 feet (0.44 m/s) with RW and right hand support/wrist wrap    Time  8    Period  Weeks    Status  On-going      PT LONG TERM GOAL #3   Title  Patient will exhibit 1 grade increased in muscle strength of rt LE to indicate improved muscle strength and control for functional activities to improve independent and safety.     Baseline  see MMT    Time  8    Period  Weeks    Status  On-going      PT LONG TERM GOAL #4   Title  Patient will be able to balance on either LE for 10 seconds or > to indicate improve functional strength and decreased risk for falls.     Baseline  initial - rt 0 sec; lt 6 sec    Time  8    Period  Weeks    Status  On-going            Plan - 08/05/18 1558    Clinical Impression Statement  pt returned today, however insurance approval does not begin until 12/20.  Treated today without charge.  Pt with continued disgust regardin gher AFO and feels it is making her walking and function worse rather than better.  Therapist has sent order to MD for orthotic consult as she needs a knee brace for hyperextension.  Completed all therex today out of AFO.  Pt with good ankle AROM and completed ambulation without toe drag and minimal knee hyperextension.      Rehab Potential  Good    Clinical Impairments Affecting Rehab Potential  positive - motivated to return to independent living; negative - tone in Rt LE affecting balance, gait and coordination    PT Frequency  2x / week    PT Duration  8 weeks    PT Treatment/Interventions  ADLs/Self Care Home Management;DME Instruction;Gait training;Stair training;Functional mobility training;Therapeutic activities;Therapeutic  exercise;Balance training;Neuromuscular re-education;Cognitive remediation;Patient/family education;Orthotic Fit/Training;Manual techniques;Joint Manipulations;Spinal Manipulations;Energy conservation;Dry needling;Aquatic Therapy    PT Next Visit Plan  Continue QC gait, quadruped and tall  kneeling activities.  Focus on standing exercises/gait outside of AFO, Rt hamstring strengthening.  Follow up on order for orthotic consult sent to MD.     PT Home Exercise Plan  initial - supine bridging       Patient will benefit from skilled therapeutic intervention in order to improve the following deficits and impairments:  Abnormal gait, Impaired tone, Decreased activity tolerance, Decreased strength, Impaired UE functional use, Decreased balance, Difficulty walking, Decreased coordination  Visit Diagnosis: Other abnormalities of gait and mobility  Other symptoms and signs involving the musculoskeletal system     Problem List Patient Active Problem List   Diagnosis Date Noted  . Dental disease 07/02/2018  . Hemiparesis affecting right side as late effect of stroke ([place]) 06/17/2018  . Stroke (cerebrum) ([place]) 06/17/2018  . H/O medication noncompliance   . Chronic pain syndrome   . Leukocytosis   . Acute ischemic stroke ([place]) 06/13/2018  . Tobacco abuse 06/13/2018  . Lacunar stroke ([place]) 06/12/2018  . Hypertension   . COPD (chronic obstructive pulmonary disease) ([place])   . GERD (gastroesophageal reflux disease) 04/06/2012  . Abdominal pain 04/06/2012  . Nausea & vomiting 04/06/2012  . Melena 04/06/2012   Judy Boyd, Judy Boyd [phone]  Judy Boyd 08/05/2018, 4:04 PM  [place], [place], [address] Phone: [phone]   Fax:  [phone]  Name: Judy Boyd MRN: [id number] Date of Birth: Jan 09, 1961

## 2018-08-09 ENCOUNTER — Ambulatory Visit (HOSPITAL_COMMUNITY): Payer: Medicaid Other | Admitting: Physical Therapy

## 2018-08-09 DIAGNOSIS — G8111 Spastic hemiplegia affecting right dominant side: Secondary | ICD-10-CM | POA: Diagnosis not present

## 2018-08-09 DIAGNOSIS — R29898 Other symptoms and signs involving the musculoskeletal system: Secondary | ICD-10-CM

## 2018-08-09 DIAGNOSIS — R2689 Other abnormalities of gait and mobility: Secondary | ICD-10-CM

## 2018-08-09 NOTE — Therapy (Signed)
[facility] [address] [place], [place], [address] Phone: [phone]   Fax:  [phone]  Physical Therapy Treatment  Patient Details  Name: Judy Boyd MRN: [id number] Date of Birth: [birth year] Referring Provider (PT): Letta Pate Luanna Salk, MD   Encounter Date: 08/09/2018  PT End of Session - 08/09/18 1557    Visit Number  7    Number of Visits  12    Date for PT Re-Evaluation  09/14/18    Authorization Type  Medicaid approval 4 sessions 12/06-->08/05/18, approved 3 sessions 08/06/18-08/17/18    Authorization - Visit Number  1    Authorization - Number of Visits  3    PT Start Time  1520    PT Stop Time  1600    PT Time Calculation (min)  40 min    Equipment Utilized During Treatment  Gait belt    Activity Tolerance  Patient tolerated treatment well    Behavior During Therapy  [facility] for tasks assessed/performed       Past Medical History:  Diagnosis Date  . Anxiety   . Arthritis   . Asthma   . Bowel obstruction ([place])   . Chronic pain   . COPD (chronic obstructive pulmonary disease) ([place])   . Depression   . GERD (gastroesophageal reflux disease)   . Hypertension     Past Surgical History:  Procedure Laterality Date  . ABDOMINAL SURGERY  2003   blockage  . left salpingectomy    . uterine ablation      There were no vitals filed for this visit.  Subjective Assessment - 08/09/18 1616    Subjective  pt without complaints.  Wearing her AFO today.    Currently in Pain?  No/denies                       [facility] Adult PT Treatment/Exercise - 08/09/18 0001      Ambulation/Gait   Gait Comments  226 feet with QC and without AFO      Knee/Hip Exercises: Standing   Heel Raises  Both;15 reps;2 sets;Limitations    Heel Raises Limitations  AFO off    Knee Flexion  AAROM;Right;2 sets;10 reps    Knee Flexion Limitations  from 4" step with AFO off    Forward Lunges  Right;15 reps;2 sets    Forward Lunges  Limitations  onto 4" step AFO off      Knee/Hip Exercises: Seated   Long Arc Quad  Strengthening;Right;10 reps;Weights;2 sets    [employer] Weight  4 lbs.    Other Seated Knee/Hip Exercises  ankle dorsiflexion 2X10 AFO off    Other Seated Knee/Hip Exercises  ankle windshield wipers forefoot on towel, stabilzing heel 10 reps X 2 AFO off             PT Education - 08/09/18 1522    Education Details  order received for orthotic consult.  given to patient along with providers available in the area.    Person(s) Educated  Patient    Methods  Explanation;Handout    Comprehension  Verbalized understanding       PT Short Term Goals - 08/02/18 1629      PT SHORT TERM GOAL #1   Title  Patient will be independent with initial HEP and perform as directed to increase training and improve return to function.     Time  4    Period  Weeks  Status  On-going      PT SHORT TERM GOAL #2   Title  Patient will be able to ambulate 50 ft > ( 0.58 m/s gait velocity) than inital evaluation of 2 MWT to indicate improve gait velocity, functional strength and balance and decrease risk for falls.     Baseline  inital - 160 feet (0.44 m/s) with RW and right hand support/wrist wrap    Time  4    Period  Weeks    Status  On-going      PT SHORT TERM GOAL #3   Title  Patient will exhibit 1/2 grade increased in muscle strength of rt LE to indicate improved muscle strength and control for functional activities to improve independent and safety.     Baseline  see MMT    Time  4    Period  Weeks    Status  Partially Met        PT Long Term Goals - 08/02/18 1630      PT LONG TERM GOAL #1   Title  Patient will be independent with advanced HEP and perform as directed to increase training and improve return to function.     Time  8    Period  Weeks    Status  On-going      PT LONG TERM GOAL #2   Title  Patient will be able to ambulate 288 ft or > ( 0.80 m/s gait velocity) than inital evaluation  of 2 MWT to indicate improve gait velocity, functional strength and balance and decrease risk for falls.     Baseline  inital - 160 feet (0.44 m/s) with RW and right hand support/wrist wrap    Time  8    Period  Weeks    Status  On-going      PT LONG TERM GOAL #3   Title  Patient will exhibit 1 grade increased in muscle strength of rt LE to indicate improved muscle strength and control for functional activities to improve independent and safety.     Baseline  see MMT    Time  8    Period  Weeks    Status  On-going      PT LONG TERM GOAL #4   Title  Patient will be able to balance on either LE for 10 seconds or > to indicate improve functional strength and decreased risk for falls.     Baseline  initial - rt 0 sec; lt 6 sec    Time  8    Period  Weeks    Status  On-going            Plan - 08/09/18 1558    Clinical Impression Statement  contiued with progression and focus on strengthening Rt LE, improving function and independence with gait.  Pt given order for orthosis and instructed to call and schedule.  Daughter asking about OT and requested order be sent to MD to help improve Rt UE strength/function.  Able to increase sets/reps of most exercises today.     Rehab Potential  Good    Clinical Impairments Affecting Rehab Potential  positive - motivated to return to independent living; negative - tone in Rt LE affecting balance, gait and coordination    PT Frequency  2x / week    PT Duration  8 weeks    PT Treatment/Interventions  ADLs/Self Care Home Management;DME Instruction;Gait training;Stair training;Functional mobility training;Therapeutic activities;Therapeutic exercise;Balance training;Neuromuscular re-education;Cognitive remediation;Patient/family education;Orthotic Fit/Training;Manual techniques;Joint Manipulations;Spinal Manipulations;Energy  conservation;Dry needling;Aquatic Therapy    PT Next Visit Plan  Continue QC gait, quadruped and tall kneeling activities.  Focus on  standing exercises/gait outside of AFO, Rt hamstring strengthening.  Follow up on order for OT sent to MD.     PT Home Exercise Plan  initial - supine bridging       Patient will benefit from skilled therapeutic intervention in order to improve the following deficits and impairments:  Abnormal gait, Impaired tone, Decreased activity tolerance, Decreased strength, Impaired UE functional use, Decreased balance, Difficulty walking, Decreased coordination  Visit Diagnosis: Other abnormalities of gait and mobility  Other symptoms and signs involving the musculoskeletal system     Problem List Patient Active Problem List   Diagnosis Date Noted  . Dental disease 07/02/2018  . Hemiparesis affecting right side as late effect of stroke ([place]) 06/17/2018  . Stroke (cerebrum) ([place]) 06/17/2018  . H/O medication noncompliance   . Chronic pain syndrome   . Leukocytosis   . Acute ischemic stroke ([place]) 06/13/2018  . Tobacco abuse 06/13/2018  . Lacunar stroke ([place]) 06/12/2018  . Hypertension   . COPD (chronic obstructive pulmonary disease) ([place])   . GERD (gastroesophageal reflux disease) 04/06/2012  . Abdominal pain 04/06/2012  . Nausea & vomiting 04/06/2012  . Melena 04/06/2012   Teena Irani, PTA/CLT [phone]  Teena Irani 08/09/2018, 4:16 PM  [facility] [address] [place], [place], [address] Phone: [phone]   Fax:  [phone]  Name: Judy Boyd MRN: [id number] Date of Birth: 09-21-1960

## 2018-08-12 ENCOUNTER — Ambulatory Visit (HOSPITAL_COMMUNITY): Payer: Medicaid Other

## 2018-08-12 ENCOUNTER — Encounter (HOSPITAL_COMMUNITY): Payer: Self-pay

## 2018-08-12 ENCOUNTER — Telehealth: Payer: Self-pay | Admitting: Physical Medicine & Rehabilitation

## 2018-08-12 DIAGNOSIS — R29898 Other symptoms and signs involving the musculoskeletal system: Secondary | ICD-10-CM

## 2018-08-12 DIAGNOSIS — M6281 Muscle weakness (generalized): Secondary | ICD-10-CM

## 2018-08-12 DIAGNOSIS — G8111 Spastic hemiplegia affecting right dominant side: Secondary | ICD-10-CM | POA: Diagnosis not present

## 2018-08-12 DIAGNOSIS — R2689 Other abnormalities of gait and mobility: Secondary | ICD-10-CM

## 2018-08-12 NOTE — Therapy (Signed)
[facility] [address] [place], [place], [address] Phone: [phone]   Fax:  [phone]  Physical Therapy Treatment  Patient Details  Name: Judy Boyd MRN: [id number] Date of Birth: Sep 06, 1960 Referring Provider (PT): Letta Pate Luanna Salk, MD   Encounter Date: 08/12/2018  PT End of Session - 08/12/18 1311    Visit Number  8    Number of Visits  12    Date for PT Re-Evaluation  09/14/18    Authorization Type  Medicaid approval 4 sessions 12/06-->08/05/18, approved 3 sessions 08/06/18-08/17/18    Authorization Time Period  Request 12 additional visits for total of 16 visits, 2x/ week/8 weeks    Authorization - Visit Number  2    Authorization - Number of Visits  3    PT Start Time  1301    PT Stop Time  1344    PT Time Calculation (min)  43 min    Equipment Utilized During Treatment  Gait belt    Activity Tolerance  Patient tolerated treatment well    Behavior During Therapy  [facility] for tasks assessed/performed       Past Medical History:  Diagnosis Date  . Anxiety   . Arthritis   . Asthma   . Bowel obstruction ([place])   . Chronic pain   . COPD (chronic obstructive pulmonary disease) ([place])   . Depression   . GERD (gastroesophageal reflux disease)   . Hypertension     Past Surgical History:  Procedure Laterality Date  . ABDOMINAL SURGERY  2003   blockage  . left salpingectomy    . uterine ablation      There were no vitals filed for this visit.  Subjective Assessment - 08/12/18 1309    Subjective  Pt stated main difficulty with Rt knee buckling, plans to go to [facility] tomorrow to discuss brace for knee.  Has been going wihtout AFO some around the house, does continue wiht AFO out of home.      Pertinent History  HTN ischemic stroke, COPD, GERD, lacunar stroke, hemi affecting rt side    Patient Stated Goals  I want to get back to being independent and living alone.    Currently in Pain?  No/denies                        [facility] Adult PT Treatment/Exercise - 08/12/18 0001      Ambulation/Gait   Gait Comments  226 feet with QC and without AFO      Knee/Hip Exercises: Standing   Heel Raises  20 reps    Heel Raises Limitations  AFO off; incline slope    Knee Flexion  AAROM;Right;2 sets;10 reps    Knee Flexion Limitations  from 4" step with AFO off    Forward Lunges  Right;15 reps;2 sets    Forward Lunges Limitations  onto 4" step AFO off    Functional Squat  10 reps    Functional Squat Limitations  cueing for mechanics     Stairs  3 sets initially step to then reciprocal pattern wtih 1 HR and cueing for sequence      Knee/Hip Exercises: Seated   Long Arc Quad  Strengthening;Right;10 reps;Weights;2 sets    Long Arc Quad Weight  5 lbs.    Other Seated Knee/Hip Exercises  ankle dorsiflexion 2X10 AFO off    Other Seated Knee/Hip Exercises  ankle windshield wipers forefoot on towel, stabilzing heel 10 reps  X 2 AFO off      Knee/Hip Exercises: Prone   Other Prone Exercises  Tall kneeling throw and catch 20x pink ball          Balance Exercises - 08/12/18 1757      Balance Exercises: Standing   SLS with Vectors  Upper extremity assist 1;Time   10x 5" holds         PT Short Term Goals - 08/02/18 1629      PT SHORT TERM GOAL #1   Title  Patient will be independent with initial HEP and perform as directed to increase training and improve return to function.     Time  4    Period  Weeks    Status  On-going      PT SHORT TERM GOAL #2   Title  Patient will be able to ambulate 50 ft > ( 0.58 m/s gait velocity) than inital evaluation of 2 MWT to indicate improve gait velocity, functional strength and balance and decrease risk for falls.     Baseline  inital - 160 feet (0.44 m/s) with RW and right hand support/wrist wrap    Time  4    Period  Weeks    Status  On-going      PT SHORT TERM GOAL #3   Title  Patient will exhibit 1/2 grade increased in muscle  strength of rt LE to indicate improved muscle strength and control for functional activities to improve independent and safety.     Baseline  see MMT    Time  4    Period  Weeks    Status  Partially Met        PT Long Term Goals - 08/02/18 1630      PT LONG TERM GOAL #1   Title  Patient will be independent with advanced HEP and perform as directed to increase training and improve return to function.     Time  8    Period  Weeks    Status  On-going      PT LONG TERM GOAL #2   Title  Patient will be able to ambulate 288 ft or > ( 0.80 m/s gait velocity) than inital evaluation of 2 MWT to indicate improve gait velocity, functional strength and balance and decrease risk for falls.     Baseline  inital - 160 feet (0.44 m/s) with RW and right hand support/wrist wrap    Time  8    Period  Weeks    Status  On-going      PT LONG TERM GOAL #3   Title  Patient will exhibit 1 grade increased in muscle strength of rt LE to indicate improved muscle strength and control for functional activities to improve independent and safety.     Baseline  see MMT    Time  8    Period  Weeks    Status  On-going      PT LONG TERM GOAL #4   Title  Patient will be able to balance on either LE for 10 seconds or > to indicate improve functional strength and decreased risk for falls.     Baseline  initial - rt 0 sec; lt 6 sec    Time  8    Period  Weeks    Status  On-going            Plan - 08/12/18 1758    Clinical Impression Statement  Continued with gait training wihtout  AFO, functional strengthening and balance training.  Pt reports she has [facility] to [facility] orthotic to assist iwht Rt knee buckling.  OT referral has not been signed yet, front desk refaxed today.  Added squats and stair training for functional strengthening.  Pt able to complete following cueing for mechanics and sequencing with reciprocal pattern.  No reports of pain through session, was limited by fatigue  with activities.      Rehab Potential  Good    Clinical Impairments Affecting Rehab Potential  positive - motivated to return to independent living; negative - tone in Rt LE affecting balance, gait and coordination    PT Frequency  2x / week    PT Duration  8 weeks    PT Treatment/Interventions  ADLs/Self Care Home Management;DME Instruction;Gait training;Stair training;Functional mobility training;Therapeutic activities;Therapeutic exercise;Balance training;Neuromuscular re-education;Cognitive remediation;Patient/family education;Orthotic Fit/Training;Manual techniques;Joint Manipulations;Spinal Manipulations;Energy conservation;Dry needling;Aquatic Therapy    PT Next Visit Plan  Continue QC gait, quadruped and tall kneeling activities.  Focus on standing exercises/gait outside of AFO, Rt hamstring strengthening.  Follow up on order for OT sent to MD.     PT Home Exercise Plan  initial - supine bridging       Patient will benefit from skilled therapeutic intervention in order to improve the following deficits and impairments:  Abnormal gait, Impaired tone, Decreased activity tolerance, Decreased strength, Impaired UE functional use, Decreased balance, Difficulty walking, Decreased coordination  Visit Diagnosis: Spastic hemiplegia affecting right dominant side, unspecified etiology (HCC)  Muscle weakness (generalized)  Other abnormalities of gait and mobility  Other symptoms and signs involving the musculoskeletal system     Problem List Patient Active Problem List   Diagnosis Date Noted  . Dental disease 07/02/2018  . Hemiparesis affecting right side as late effect of stroke ([place]) 06/17/2018  . Stroke (cerebrum) ([place]) 06/17/2018  . H/O medication noncompliance   . Chronic pain syndrome   . Leukocytosis   . Acute ischemic stroke ([place]) 06/13/2018  . Tobacco abuse 06/13/2018  . Lacunar stroke ([place]) 06/12/2018  . Hypertension   . COPD (chronic obstructive pulmonary disease) ([place])    . GERD (gastroesophageal reflux disease) 04/06/2012  . Abdominal pain 04/06/2012  . Nausea & vomiting 04/06/2012  . Melena 04/06/2012   Ihor Austin, LPTA; [place]  Aldona Lento 08/12/2018, 6:02 PM  [facility] [address] [place], [place], [address] Phone: [phone]   Fax:  [phone]  Name: Judy Boyd MRN: [id number] Date of Birth: [birth year]

## 2018-08-12 NOTE — Telephone Encounter (Signed)
NANCY FRENCH FROM OT AT [facility] CALLED AND NEEDS ORDER FOR OCC THERAPY PLEASE HER NUMBER IS [phone] IF YOU HAVE QUESTIONS

## 2018-08-13 ENCOUNTER — Ambulatory Visit: Payer: Medicaid Other | Admitting: Adult Health

## 2018-08-13 ENCOUNTER — Encounter: Payer: Self-pay | Admitting: Adult Health

## 2018-08-13 VITALS — BP 159/93 | HR 84 | Ht 68.0 in | Wt 139.0 lb

## 2018-08-13 DIAGNOSIS — E785 Hyperlipidemia, unspecified: Secondary | ICD-10-CM | POA: Diagnosis not present

## 2018-08-13 DIAGNOSIS — I63512 Cerebral infarction due to unspecified occlusion or stenosis of left middle cerebral artery: Secondary | ICD-10-CM

## 2018-08-13 DIAGNOSIS — I10 Essential (primary) hypertension: Secondary | ICD-10-CM | POA: Diagnosis not present

## 2018-08-13 DIAGNOSIS — I69351 Hemiplegia and hemiparesis following cerebral infarction affecting right dominant side: Secondary | ICD-10-CM

## 2018-08-13 NOTE — Progress Notes (Signed)
[facility] [address] [place]. [place] [address] [phone]       OFFICE FOLLOW UP NOTE  [name] Date of Birth:  1961-06-10 Medical Record Number:  [id number]   Reason for Referral:  hospital stroke follow up  CHIEF COMPLAINT:  Chief Complaint  Patient presents with  . Hospitalization Follow-up    Stoke follow up. Daughter present. Rm 9. Patient's daughter mentioned she has been having pain that prevents her doing therapy., she is unable to turn over in the bed.     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 History obtained from patient, daughter and chart review. Reviewed all radiology images and labs personally.  Judy Boyd is a 57 y.o. female with history of hypertension, anxiety, depression, COPD, GERD who presented with R facial weakness, dysarthria and R hemiparesis who was admitted to [facility] 06/12/18 with 2 day hx of symptoms, non complaint with meds PTA. Transferred to [facility] 10/29 with worsened hemiparesis.  CT head obtained at [facility] showed old caudate infarcts.  CTA neck obtained to [facility] showed right mandible odontogenic infection with extensive dental disease and submandibular lymphadenopathy, right thyroid nodules, aortic arthrosclerosis and emphysema.  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  CT head obtained at [facility] showed left pontine infarct.  MRI brain obtained at [facility] showed left thalamic infarct and involve left pontine infarct.  2D echo showed an EF of 60 to 65% without cardiac source of embolus identified.  LDL 143 and A1c 4.6.  Patient was noncompliant with aspirin 81 mg PTA and recommended DAPT for 3 weeks and  aspirin alone.  Hypertensive urgency upon arrival with SBP 1 98-2 09 most likely due to noncompliance with medications.  BP stabilized throughout admission and recommended long-term BP goal normotensive range.  Initiated atorvastatin 40 mg for HLD management.  Current tobacco use with smoking cessation counseling provided.  Patient was discharged to [facility] for continued therapies in stable condition.  Patient is being seen today for hospital follow-up and is accompanied by her daughter.  She was discharged home with recommendations of home health therapies through advanced home care.  She is currently receiving outpatient PT. she continues to have right hemiparesis arm > leg with improvement, balance difficulties and mild dysarthria.  Her daughter was previously staying with her after hospital discharge but patient is currently living independently and is able to maintain all ADLs independently.  Daughter does state that she checks on her daily.  Daughter is concerned as she has not been receiving occupational therapy but does state that they have reached out to physical medicine for this order.  She has completed 3 weeks DAPT and continues on aspirin alone without side effects of bleeding or bruising.  Continues on atorvastatin without side effects myalgias.  Blood pressure today 159/93 and typically 130-140/80s at home.  She does endorse chronic pain which was previously treated by orthopedics and was on narcotics for but this has been discontinued and states that after her stroke, her prior orthopedic would not "touch her" and she needs to be seen by pain medicine.  She has completely quit smoking since hospital discharge.  She does have  appointment scheduled with neurosurgery in 10/2018 for further evaluation of previously found aneurysms.  No further concerns at this time.  Denies new or worsening stroke/TIA symptoms   ROS:   14 system review of systems performed and negative with exception of fatigue,  shortness of breath, joint pain, joint swelling, aching muscles, numbness, weakness, depression, anxiety, change in appetite and restless legs  PMH:  Past Medical History:  Diagnosis Date  . Anxiety   . Arthritis   . Asthma   . Bowel obstruction ([place])   . Chronic pain   . COPD (chronic obstructive pulmonary disease) ([place])   . Depression   . GERD (gastroesophageal reflux disease)   . Hypertension     PSH:  Past Surgical History:  Procedure Laterality Date  . ABDOMINAL SURGERY  2003   blockage  . left salpingectomy    . uterine ablation      Social History:  Social History   Socioeconomic History  . Marital status: Single    Spouse name: Not on file  . Number of children: 2  . Years of education: Not on file  . Highest education level: Not on file  Occupational History  . Occupation: unemployed  Social Needs  . Financial resource strain: Not on file  . Food insecurity:    Worry: Not on file    Inability: Not on file  . Transportation needs:    Medical: Not on file    Non-medical: Not on file  Tobacco Use  . Smoking status: Current Every Day Smoker    Packs/day: 1.00    Years: 10.00    Pack years: 10.00    Types: Cigarettes  . Smokeless tobacco: Never Used  Substance and Sexual Activity  . Alcohol use: Yes    Boyd: 2 beers per week  . Drug use: No  . Sexual activity: Yes    Birth control/protection: None  Lifestyle  . Physical activity:    Days per week: Not on file    Minutes per session: Not on file  . Stress: Not on file  Relationships  . Social connections:    Talks on phone: Not on file    Gets together: Not on file    Attends religious service: Not on file    Active member of club or organization: Not on file    Attends meetings of clubs or organizations: Not on file    Relationship status: Not on file  . Intimate partner violence:    Fear of current or ex partner: Not on file    Emotionally abused: Not on file    Physically abused: Not  on file    Forced sexual activity: Not on file  Other Topics Concern  . Not on file  Social History Narrative  . Not on file    Family History:  Family History  Problem Relation Age of Onset  . Heart disease Mother   . Heart disease Father     Medications:   Current Outpatient Medications on File Prior to Visit  Medication Sig Dispense Refill  . acetaminophen (TYLENOL) 325 MG tablet Take 1-2 tablets (325-650 mg total) by mouth every 4 (four) hours as needed for mild pain.    Marland Kitchen albuterol (PROVENTIL HFA;VENTOLIN HFA) 108 (90 BASE) MCG/ACT inhaler Inhale 2 puffs into the lungs every 6 (six) hours as needed. Shortness of breath    . aspirin EC 81 MG tablet Take 81 mg by mouth daily.    Marland Kitchen atorvastatin (LIPITOR) 40  MG tablet Take 1 tablet (40 mg total) by mouth daily at 6 PM. 30 tablet 0  . budesonide-formoterol (SYMBICORT) 160-4.5 MCG/ACT inhaler Inhale 2 puffs into the lungs 2 (two) times daily.     . clopidogrel (PLAVIX) 75 MG tablet Take 1 tablet (75 mg total) by mouth daily. 7 tablet 0  . clorazepate (TRANXENE) 7.5 MG tablet Take 7.5 mg by mouth 2 (two) times daily as needed for anxiety.    . hydrochlorothiazide (HYDRODIURIL) 12.5 MG tablet Take 1 tablet (12.5 mg total) by mouth daily. 30 tablet 0  . montelukast (SINGULAIR) 10 MG tablet Take 10 mg by mouth at bedtime.      . nicotine (NICODERM CQ - DOSED IN MG/24 HOURS) 14 mg/24hr patch Place 1 patch (14 mg total) onto the skin daily. 28 patch 0  . polyethylene glycol (MIRALAX / GLYCOLAX) packet Take 17 g by mouth daily. 30 each 0   No current facility-administered medications on file prior to visit.     Allergies:   Allergies  Allergen Reactions  . Amlodipine Other (See Comments)    Dehydration      Physical Exam  Vitals:   08/13/18 1053  BP: (!) 159/93  Pulse: 84  Weight: 139 lb (63 kg)  Height: 5\' 8"  (1.727 m)   Body mass index is 21.13 kg/m. No exam data present  General: Pleasant frail African-American  female, seated, in no evident distress Head: head normocephalic and atraumatic.   Neck: supple with no carotid or supraclavicular bruits Cardiovascular: regular rate and rhythm, no murmurs Musculoskeletal: no deformity Skin:  no rash/petichiae Vascular:  Normal pulses all extremities  Neurologic Exam Mental Status: Awake and fully alert.  Mild dysarthria.  Oriented to place and time. Recent and remote memory intact. Attention span, concentration and fund of knowledge appropriate. Mood and affect appropriate.  Cranial Nerves: Fundoscopic exam reveals sharp disc margins. Pupils equal, briskly reactive to light. Extraocular movements full without nystagmus. Visual fields full to confrontation. Hearing intact. Facial sensation intact. Face, tongue, palate moves normally and symmetrically.  Motor: Normal bulk and tone.  RUE: 3+/5 distally with weak grip strength; RLE: 4/5 distally with weak ankle dorsiflexion and foot drop brace currently in place Sensory.: intact to touch , pinprick , position and vibratory sensation.  Coordination: Rapid alternating movements normal on left side. Finger-to-nose and heel-to-shin performed accurately on left side.  Orbits left arm over right arm.  Decreased right hand finger dexterity Gait and Station: Arises from chair without difficulty. Stance is normal. Gait demonstrates hemiplegic gait without use of assistive device. Unable to heel, toe and tandem walk without difficulty.  Reflexes: 1+ and symmetric. Toes downgoing.    NIHSS 2 Modified Rankin  2    Diagnostic Data (Labs, Imaging, Testing)  CT HEAD WO CONTRAST 06/12/2018 -[facility] IMPRESSION: 1. Probable remote lacunar type infarcts in the left caudate area. 2. No CT findings for acute hemispheric infarction or intracranial hemorrhage.  CT ANGIO NECK W OR WO CONTRAST 06/13/2018 -[facility] IMPRESSION: 1. Patent carotid and vertebral arteries. No dissection, occlusion, aneurysm, or  hemodynamically significant stenosis by NASCET criteria. 2. Soft tissue inflammation over the right body of mandible, probable odontogenic infection, no discrete soft tissue abscess. 3. Extensive dental disease with multiple periapical cysts and carries including the right posterior mandibular molars subjacent to soft tissue inflammation. 4. Right submandibular lymphadenopathy, likely reactive. 5. Thyroid nodules measuring up to 19 mm in the right lobe. Recommend further evaluation with thyroid ultrasound on  a nonemergent basis. 6. Aortic Atherosclerosis (ICD10-I70.0) and Emphysema (ICD10-J43.9).  MR BRAIN WO CONTRAST MR MRA HEAD  06/14/2018 - IMPRESSION: 1. 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 2. 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 Unfortunately, the right Pcomm which is a fetal origin of the Right PCA origin to arise directly from the aneurysm. Neuro-Interventional Radiology consultation is suggested to evaluate the appropriateness of potential treatment. Non-emergent evaluation can be arranged by calling [phone] during usual hours. Emergency evaluation can be requested by paging [phone]. 3. Subacute lacunar infarct of the left thalamus, with underlying moderately advanced bilateral deep gray matter small vessel ischemia. 4. Intracranial MRA is negative for intracranial stenosis or branch Occlusion.  CT HEAD WO CONTRAST 06/15/2018 - [facility] IMPRESSION: Left pontine low density is noted consistent with acute infarction described on prior MRI. No other significant changes noted compared to prior exam.  MR BRAIN WO CONTRAST 06/16/2018 - [facility] IMPRESSION: 1. Progressed acute lacunar infarct in the left thalamus since 06/14/2018, which was only faint on that recent MRI and suspected to be subacute  rather than acute at that time. 2. The Left Pontine infarct demonstrates expected evolution. 3. No associated hemorrhage or mass effect.  ECHOCARDIOGRAM 06/13/2018 Study Conclusions  - Left ventricle: The cavity size was normal. Wall thickness was   increased in a pattern of moderate LVH. Systolic function was   normal. The estimated ejection fraction was in the range of 60%   to 65%. Wall motion was normal; there were no regional wall   motion abnormalities. Doppler parameters are consistent with   abnormal left ventricular relaxation (grade 1 diastolic   dysfunction). - Aortic valve: Valve area (VTI): 2 cm^2. Valve area (Vmax): 2.21   cm^2. Valve area (Vmean): 2.11 cm^2. - Left atrium: The atrium was mildly dilated. - Technically adequate study.    ASSESSMENT: Judy Boyd is a 57 y.o. year old female here with left pontine and left thalamic infarcts on 06/12/2018 secondary to small vessel disease. Vascular risk factors include HTN, HLD, COPD and medication noncompliance.  Patient is being seen today for hospital follow-up and does continue to have right hemiparesis, dysarthria and balance difficulties.    PLAN:  1. Left pontine and left thalamic infarcts: Continue aspirin 81 mg daily  and atorvastatin 40 mg daily for secondary stroke prevention. Maintain strict control of hypertension with blood pressure goal below 130/90, diabetes with hemoglobin A1c goal below 6.5% and cholesterol with LDL cholesterol (bad cholesterol) goal below 70 mg/dL.  I also advised the patient to eat a healthy diet with plenty of whole grains, cereals, fruits and vegetables, exercise regularly with at least 30 minutes of continuous activity daily and maintain ideal body weight.  Advised patient to continue physical therapy along with initiating occupational therapy.  Advised her that if she would like to initiate speech therapy to let us know and orders can be placed as she is declining at this  time. 2. HTN: Advised to continue current treatment regimen.  Today's BP 159/93.  Advised to continue to monitor at home along with continued follow-up with PCP for management 3. HLD: Advised to continue current treatment regimen along with continued follow-up with PCP for future prescribing and monitoring of lipid panel 4. Aneurysms: Neurosurgery appointment scheduled in 10/2018 for further evaluation and management     Follow up in 3 months or call  earlier if needed   Greater than 50% of time during this 25 minute visit was spent on counseling, explanation of diagnosis of left pontine and left thalamic infarcts, reviewing risk factor management of HTN and HLD, planning of further management along with potential future management, and discussion with patient and family answering all questions.    Venancio Poisson, AGNP-BC  [facility] [address] [place], [place] [address]  Phone [phone] Fax [phone] Note: This document was prepared with digital dictation and possible smart phrase technology. Any transcriptional errors that result from this process are unintentional.

## 2018-08-13 NOTE — Telephone Encounter (Signed)
I spoke to [place], she said it was a order faxed. The order was faxed and is waiting for Dr. Letta Pate to sign

## 2018-08-13 NOTE — Patient Instructions (Signed)
Continue aspirin 81 mg daily  and lipitor  for secondary stroke prevention  Continue to follow up with PCP regarding cholesterol and blood pressure management   Continue physical therapy and start occupation therapy; if you would ike to start speech therapy, please let us know and we can place order   [place] job with quitting smoking -keep up the good work!!!  Continue to monitor blood pressure at home  Maintain strict control of hypertension with blood pressure goal below 130/90, diabetes with hemoglobin A1c goal below 6.5% and cholesterol with LDL cholesterol (bad cholesterol) goal below 70 mg/dL. I also advised the patient to eat a healthy diet with plenty of whole grains, cereals, fruits and vegetables, exercise regularly and maintain ideal body weight.  Followup in the future with me in 3 months or call earlier if needed       Thank you for coming to see [place] at [facility]. I hope we have been able to provide you high quality care today.  You may receive a patient satisfaction survey over the next few weeks. We would appreciate your feedback and comments so that we may continue to improve ourselves and the health of our patients.

## 2018-08-17 ENCOUNTER — Ambulatory Visit (HOSPITAL_COMMUNITY): Payer: Medicaid Other | Admitting: Physical Therapy

## 2018-08-17 ENCOUNTER — Encounter (HOSPITAL_COMMUNITY): Payer: Self-pay | Admitting: Physical Therapy

## 2018-08-17 DIAGNOSIS — M6281 Muscle weakness (generalized): Secondary | ICD-10-CM

## 2018-08-17 DIAGNOSIS — G8111 Spastic hemiplegia affecting right dominant side: Secondary | ICD-10-CM

## 2018-08-17 DIAGNOSIS — R2689 Other abnormalities of gait and mobility: Secondary | ICD-10-CM

## 2018-08-17 NOTE — Therapy (Signed)
[place], [place], [address] Phone: [phone]   Fax:  [phone]  Physical Therapy Treatment  Patient Details  Name: Judy Boyd MRN: [id number] Date of Birth: 10/10/1960 Referring Provider (PT): Kirsteins, Luanna Salk, MD  PHYSICAL THERAPY DISCHARGE SUMMARY  Visits from Start of Care: 9 Current functional level related to goals / functional outcomes: See below   Remaining deficits: As below   Education / Equipment: HEP Plan: Patient agrees to discharge.  Patient goals were not met. Patient is being discharged due to meeting the stated rehab goals.  ?????       Encounter Date: 08/17/2018  PT End of Session - 08/17/18 1411    Visit Number  9    Number of Visits  9    Date for PT Re-Evaluation  09/14/18    Authorization Type  Medicaid approval 4 sessions 12/06-->08/05/18, approved 3 sessions 08/06/18-08/17/18    Authorization Time Period  Request 12 additional visits for total of 16 visits, 2x/ week/8 weeks    Authorization - Visit Number  2    Authorization - Number of Visits  3    PT Start Time  2025    PT Stop Time  1430    PT Time Calculation (min)  41 min    Equipment Utilized During Treatment  Gait belt    Activity Tolerance  Patient tolerated treatment well    Behavior During Therapy  WFL for tasks assessed/performed       Past Medical History:  Diagnosis Date  . Anxiety   . Arthritis   . Asthma   . Bowel obstruction ([place])   . Chronic pain   . COPD (chronic obstructive pulmonary disease) ([place])   . Depression   . GERD (gastroesophageal reflux disease)   . Hypertension     Past Surgical History:  Procedure Laterality Date  . ABDOMINAL SURGERY  2003   blockage  . left salpingectomy    . uterine ablation      There were no vitals filed for this visit.  Subjective Assessment - 08/17/18 1346    Subjective  PT states that she has been completing her exercises.  She states  that she went to the orthotist who stated that she is in the right brace and he would not change recommend changing the brace.      Pertinent History  HTN ischemic stroke, COPD, GERD, lacunar stroke, hemi affecting rt side    Patient Stated Goals  I want to get back to being independent and living alone.    Currently in Pain?  No/denies         [facility] PT Assessment - 08/17/18 0001      Assessment   Medical Diagnosis   CVA, unspecified mechanism     Referring Provider (PT)  Kirsteins, Luanna Salk, MD    Onset Date/Surgical Date  06/14/18    Hand Dominance  Right    Next MD Visit  08/20/2017    Prior Therapy  HHPT, HHSLP      Precautions   Precautions  None      Restrictions   Weight Bearing Restrictions  No      Home Environment   Living Environment  Private residence    Living Arrangements  Non-relatives/Friends    Type of [place] to enter    Entrance Stairs-Number of Steps  3      Prior  Function   Level of Independence  Independent with household mobility with device;Needs assistance with homemaking    Driving  No      Cognition   Overall Cognitive Status  Within Functional Limits for tasks assessed      Observation/Other Assessments   Observations  Patient wearing right LE AFO      Coordination   Gross Motor Movements are Fluid and Coordinated  No   limited by weakness in RUE/RLE     Strength   Right Hip Flexion  5/5   was 4+/5    Right Hip Extension  4/5   was 3+   Right Hip ABduction  4+/5   ws 4/5    Left Hip Flexion  5/5    Left Hip Extension  4+/5   was 4/5   Left Hip ABduction  5/5    Right Knee Flexion  3/5    Right Knee Extension  4/5   was 4/5    Left Knee Flexion  4+/5    Left Knee Extension  5/5    Right Ankle Dorsiflexion  4-/5   was 3+/5    Left Ankle Dorsiflexion  4+/5      Flexibility   Soft Tissue Assessment /Muscle Length  yes    Hamstrings  moderate limitation in right LE      Transfers   Five time  sit to stand comments   13.55 was 15.31 sec without hands was 15.3 1      Ambulation/Gait   Ambulation/Gait  Yes    Ambulation/Gait Assistance  6: Modified independent (Device/Increase time)    Ambulation/Gait Assistance Details  2MWT    Ambulation Distance (Feet)  236 Feet    Assistive device  Small based quad cane    Gait velocity  .599      Balance   Balance Assessed  Yes      Static Standing Balance   Static Standing - Balance Support  No upper extremity supported    Static Standing - Level of Assistance  5: Stand by assistance    Static Standing Balance -  Activities   Single Leg Stance - Right Leg;Single Leg Stance - Left Leg    Static Standing - Comment/# of Minutes  RT 0 Lt 30"                   [facility] Adult PT Treatment/Exercise - 08/17/18 0001      Ambulation/Gait   Gait Pattern  --      Knee/Hip Exercises: Seated   Other Seated Knee/Hip Exercises  15 reps ankle DF    Sit to Sand  10 reps      Knee/Hip Exercises: Prone   Hamstring Curl  10 reps    Hip Extension  10 reps         Updated HEP       PT Short Term Goals - 08/17/18 1412      PT SHORT TERM GOAL #1   Title  Patient will be independent with initial HEP and perform as directed to increase training and improve return to function.     Time  4    Period  Weeks    Status  Achieved      PT SHORT TERM GOAL #2   Title  Patient will be able to ambulate 50 ft > ( 0.58 m/s gait velocity) than inital evaluation of 2 MWT to indicate improve gait velocity, functional strength and balance and decrease risk  for falls.     Baseline  inital - 160 feet (0.44 m/s) with RW and right hand support/wrist wrap    Time  4    Period  Weeks    Status  Achieved      PT SHORT TERM GOAL #3   Title  Patient will exhibit 1/2 grade increased in muscle strength of rt LE to indicate improved muscle strength and control for functional activities to improve independent and safety.     Baseline  see MMT    Time  4     Period  Weeks    Status  Achieved        PT Long Term Goals - 08/17/18 1413      PT LONG TERM GOAL #1   Title  Patient will be independent with advanced HEP and perform as directed to increase training and improve return to function.     Time  8    Period  Weeks    Status  Achieved      PT LONG TERM GOAL #2   Title  Patient will be able to ambulate 288 ft or > ( 0.80 m/s gait velocity) than inital evaluation of 2 MWT to indicate improve gait velocity, functional strength and balance and decrease risk for falls.     Baseline  inital - 160 feet (0.44 m/s) with RW and right hand support/wrist wrap    Time  8    Period  Weeks    Status  On-going      PT LONG TERM GOAL #3   Title  Patient will exhibit 1 grade increased in muscle strength of rt LE to indicate improved muscle strength and control for functional activities to improve independent and safety.     Baseline  see MMT    Time  8    Period  Weeks    Status  Achieved      PT LONG TERM GOAL #4   Title  Patient will be able to balance on either LE for 10 seconds or > to indicate improve functional strength and decreased risk for falls.     Baseline  initial - rt 0 sec; lt 6 sec    Time  8    Period  Weeks    Status  Partially Met            Plan - 08/17/18 1437    Clinical Impression Statement  Ms. Sublette was reassessed today.  She has made good gains in all aspects except for balance.  At this time Ms. Wisnewski resquests that she just works on her deficits at home and if she is not progressing she will request to come back to therapy.    Rehab Potential  Good    Clinical Impairments Affecting Rehab Potential  positive - motivated to return to independent living; negative - tone in Rt LE affecting balance, gait and coordination    PT Frequency  2x / week    PT Duration  8 weeks    PT Treatment/Interventions  ADLs/Self Care Home Management;DME Instruction;Gait training;Stair training;Functional mobility  training;Therapeutic activities;Therapeutic exercise;Balance training;Neuromuscular re-education;Cognitive remediation;Patient/family education;Orthotic Fit/Training;Manual techniques;Joint Manipulations;Spinal Manipulations;Energy conservation;Dry needling;Aquatic Therapy    PT Next Visit Plan  Discharge per pt request     PT Home Exercise Plan  initial - supine bridging       Patient will benefit from skilled therapeutic intervention in order to improve the following deficits and impairments:  Abnormal gait, Impaired tone, Decreased  activity tolerance, Decreased strength, Impaired UE functional use, Decreased balance, Difficulty walking, Decreased coordination  Visit Diagnosis: Spastic hemiplegia affecting right dominant side, unspecified etiology (HCC)  Muscle weakness (generalized)  Other abnormalities of gait and mobility     Problem List Patient Active Problem List   Diagnosis Date Noted  . Dental disease 07/02/2018  . Hemiparesis affecting right side as late effect of stroke ([place]) 06/17/2018  . Stroke (cerebrum) ([place]) 06/17/2018  . H/O medication noncompliance   . Chronic pain syndrome   . Leukocytosis   . Acute ischemic stroke ([place]) 06/13/2018  . Tobacco abuse 06/13/2018  . Lacunar stroke ([place]) 06/12/2018  . Hypertension   . COPD (chronic obstructive pulmonary disease) ([place])   . GERD (gastroesophageal reflux disease) 04/06/2012  . Abdominal pain 04/06/2012  . Nausea & vomiting 04/06/2012  . Melena 04/06/2012   Rayetta Humphrey, PT CLT [phone] 08/17/2018, 2:38 PM  [facility] [address] [place], [place], [address] Phone: [phone]   Fax:  [phone]  Name: GENELL THEDE MRN: [id number] Date of Birth: 1961/07/28

## 2018-08-17 NOTE — Patient Instructions (Addendum)
Self-Mobilization: Knee Flexion (Prone)    Bring right heel toward buttocks as close as possible. Hold ___2_ seconds. Relax. Repeat _5-10___ times per set. Do _1___ sets per session. Do _2___ sessions per day.  http://orth.exer.us/596   Copyright  VHI. All rights reserved.  Toe Raise (Sitting)    Raise toes, keeping heels on floor. Repeat __20__ times per set. Do __1__ sets per session. Do ___2_ sessions per day.  http://orth.exer.us/46   Copyright  VHI. All rights reserved.  Functional Quadriceps: Chair Squat    Keeping feet flat on floor, shoulder width apart, squat as low as is comfortable. Use support as necessary. Repeat _15___ times per set. Do _1___ sets per session. Do ___2_ sessions per day.  http://orth.exer.us/736   Copyright  VHI. All rights reserved.  Functional Quadriceps: Sit to Stand    Sit on edge of chair, feet flat on floor. Stand upright, extending knees fully. Repeat _10___ times per set. Do _1___ sets per session. Do __2__ sessions per day.  http://orth.exer.us/734   Copyright  VHI. All rights reserved.  Feet Heel-Toe "Tandem", Head Motion - Eyes Open   Standing at the kitchen counter  With eyes open, right foot directly in front of the other,Hold for 30 seconds.  When you can hold for 30 seconds begin to  move head slowly: up and down. Repeat _5___ times per session. Do ___2_ sessions per day.  Copyright  VHI. All rights reserved.

## 2018-08-19 ENCOUNTER — Ambulatory Visit (HOSPITAL_COMMUNITY): Payer: Self-pay

## 2018-08-20 ENCOUNTER — Telehealth (HOSPITAL_COMMUNITY): Payer: Self-pay | Admitting: Occupational Therapy

## 2018-08-20 ENCOUNTER — Ambulatory Visit: Payer: Medicaid Other | Admitting: Physical Medicine & Rehabilitation

## 2018-08-20 ENCOUNTER — Encounter: Payer: Medicaid Other | Attending: Registered Nurse

## 2018-08-20 ENCOUNTER — Encounter: Payer: Self-pay | Admitting: Physical Medicine & Rehabilitation

## 2018-08-20 VITALS — BP 165/103 | HR 78 | Ht 66.0 in | Wt 138.0 lb

## 2018-08-20 DIAGNOSIS — Z1331 Encounter for screening for depression: Secondary | ICD-10-CM | POA: Insufficient documentation

## 2018-08-20 DIAGNOSIS — M79642 Pain in left hand: Secondary | ICD-10-CM | POA: Insufficient documentation

## 2018-08-20 DIAGNOSIS — R262 Difficulty in walking, not elsewhere classified: Secondary | ICD-10-CM | POA: Diagnosis not present

## 2018-08-20 DIAGNOSIS — F419 Anxiety disorder, unspecified: Secondary | ICD-10-CM | POA: Insufficient documentation

## 2018-08-20 DIAGNOSIS — Z8249 Family history of ischemic heart disease and other diseases of the circulatory system: Secondary | ICD-10-CM | POA: Diagnosis not present

## 2018-08-20 DIAGNOSIS — I6381 Other cerebral infarction due to occlusion or stenosis of small artery: Secondary | ICD-10-CM

## 2018-08-20 DIAGNOSIS — G8929 Other chronic pain: Secondary | ICD-10-CM | POA: Insufficient documentation

## 2018-08-20 DIAGNOSIS — I69351 Hemiplegia and hemiparesis following cerebral infarction affecting right dominant side: Secondary | ICD-10-CM

## 2018-08-20 DIAGNOSIS — R202 Paresthesia of skin: Secondary | ICD-10-CM | POA: Diagnosis not present

## 2018-08-20 DIAGNOSIS — F329 Major depressive disorder, single episode, unspecified: Secondary | ICD-10-CM | POA: Insufficient documentation

## 2018-08-20 DIAGNOSIS — J449 Chronic obstructive pulmonary disease, unspecified: Secondary | ICD-10-CM | POA: Diagnosis not present

## 2018-08-20 DIAGNOSIS — I729 Aneurysm of unspecified site: Secondary | ICD-10-CM | POA: Diagnosis not present

## 2018-08-20 DIAGNOSIS — F17210 Nicotine dependence, cigarettes, uncomplicated: Secondary | ICD-10-CM | POA: Insufficient documentation

## 2018-08-20 DIAGNOSIS — I10 Essential (primary) hypertension: Secondary | ICD-10-CM | POA: Insufficient documentation

## 2018-08-20 DIAGNOSIS — E041 Nontoxic single thyroid nodule: Secondary | ICD-10-CM | POA: Insufficient documentation

## 2018-08-20 DIAGNOSIS — K089 Disorder of teeth and supporting structures, unspecified: Secondary | ICD-10-CM | POA: Insufficient documentation

## 2018-08-20 DIAGNOSIS — M1990 Unspecified osteoarthritis, unspecified site: Secondary | ICD-10-CM | POA: Insufficient documentation

## 2018-08-20 DIAGNOSIS — K219 Gastro-esophageal reflux disease without esophagitis: Secondary | ICD-10-CM | POA: Diagnosis not present

## 2018-08-20 DIAGNOSIS — M7501 Adhesive capsulitis of right shoulder: Secondary | ICD-10-CM | POA: Diagnosis not present

## 2018-08-20 NOTE — Patient Instructions (Signed)
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

## 2018-08-20 NOTE — Telephone Encounter (Signed)
dont see it in my pile

## 2018-08-20 NOTE — Telephone Encounter (Signed)
Judy Boyd Called on 08/13/18 from MD office stating MD was out until 08/16/18. Called back today to get update on Referral OT request. If it had been signed yet by the MD. I do not see it in the pt's chart at this time. Waiting for return phone call. NF 1/3/

## 2018-08-20 NOTE — Progress Notes (Signed)
Subjective:    Patient ID: Judy Boyd, female    DOB: May 31, 1961, 58 y.o.   MRN: [id number] right-handed female with history of COPD, HTN, tobacco use who was admitted 06/12/2018 with 2-day history of right facial droop, dysarthria and right-sided weakness.  CT of head done revealing low attenuation in left caudate head likely due to remote infarct.  CTA head was negative for significant stenosis and showed incidental findings of soft tissue inflammation of right mandible as well as extensive dental disease and right thyroid nodule.  She was started on aspirin for secondary stroke prevention as well as Augmentin for odontogenic infection.    On 06/15/2018 she developed increasing right-sided weakness felt to be due to extension of stroke and was transferred to [facility] for treatment.  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  Neurology recommended adding Plavix x3 weeks followed by aspirin alone.  HPI Pt does not report any difference in sensation between  RIght and Left  AFO used for ambulating longer distances Patient decided not to continue outpatient therapy.  Had been receiving PT only. Patient complains of right shoulder pain mainly with raising the arm up.  No falls no previous surgeries, no neck pain numbness or tingling in the shoulder Pain Inventory Average Pain 8 Pain Right Now 6 My pain is sharp and aching  In the last 24 hours, has pain interfered with the following? General activity 5 Relation with others 6 Enjoyment of life 4 What TIME of day is your pain at its worst? evening Sleep (in general) Fair  Pain is worse with: walking, sitting, inactivity and standing Pain improves with: rest Relief from Meds: 2  Mobility use a cane ability to climb steps?  yes Do you have any goals in this area?   yes  Function disabled: date disabled . I need assistance with the following:  shopping  Neuro/Psych numbness tingling  Prior Studies Any changes since last visit?  no  Physicians involved in your care Any changes since last visit?  no   Family History  Problem Relation Age of Onset  . Heart disease Mother   . Heart disease Father    Social History   Socioeconomic History  . Marital status: Single    Spouse name: Not on file  . Number of children: 2  . Years of education: Not on file  . Highest education level: Not on file  Occupational History  . Occupation: unemployed  Social Needs  . Financial resource strain: Not on file  . Food insecurity:    Worry: Not on file    Inability: Not on file  . Transportation needs:    Medical: Not on file    Non-medical: Not on file  Tobacco Use  . Smoking status: Current Every Day Smoker    Packs/day: 1.00    Years: 10.00    Pack years: 10.00    Types: Cigarettes  . Smokeless tobacco: Never Used  Substance and Sexual Activity  . Alcohol use: Yes    Comment: 2 beers per week  . Drug use: No  . Sexual activity: Yes    Birth control/protection: None  Lifestyle  . Physical activity:    Days per week: Not on file    Minutes per session: Not on file  . Stress: Not on file  Relationships  .  Social connections:    Talks on phone: Not on file    Gets together: Not on file    Attends religious service: Not on file    Active member of club or organization: Not on file    Attends meetings of clubs or organizations: Not on file    Relationship status: Not on file  Other Topics Concern  . Not on file  Social History Narrative  . Not on file   Past Surgical History:  Procedure Laterality Date  . ABDOMINAL SURGERY  2003   blockage  . left salpingectomy    . uterine ablation     Past Medical History:  Diagnosis Date  . Anxiety   . Arthritis   . Asthma   . Bowel obstruction ([place])   . Chronic pain   . COPD (chronic  obstructive pulmonary disease) ([place])   . Depression   . GERD (gastroesophageal reflux disease)   . Hypertension    There were no vitals taken for this visit.  Opioid Risk Score:   Fall Risk Score:  `1  Depression screen PHQ 2/9  Depression screen [facility] 2/9 07/13/2018 12/11/2016  Decreased Interest 2 2  Down, Depressed, Hopeless 1 0  PHQ - 2 Score 3 2  Altered sleeping 1 2  Tired, decreased energy 3 3  Change in appetite 2 0  Feeling bad or failure about yourself  1 0  Trouble concentrating 2 0  Moving slowly or fidgety/restless 2 2  Suicidal thoughts 0 0  PHQ-9 Score 14 9  Difficult doing work/chores Extremely dIfficult Somewhat difficult     Review of Systems  Constitutional: Positive for unexpected weight change.  HENT: Negative.   Eyes: Negative.   Respiratory: Negative.   Cardiovascular: Negative.   Gastrointestinal: Negative.   Endocrine: Negative.   Genitourinary: Negative.   Musculoskeletal: Positive for arthralgias, gait problem and myalgias.  Skin: Negative.   Allergic/Immunologic: Negative.   Neurological: Positive for numbness.  Hematological: Negative.   Psychiatric/Behavioral: Negative.   All other systems reviewed and are negative.      Objective:   Physical Exam Vitals signs and nursing note reviewed.  Constitutional:      Appearance: Normal appearance.  HENT:     Nose: Nose normal.     Mouth/Throat:     Mouth: Mucous membranes are moist.  Eyes:     General: No scleral icterus.    Extraocular Movements: Extraocular movements intact.     Conjunctiva/sclera: Conjunctivae normal.     Pupils: Pupils are equal, round, and reactive to light.  Skin:    General: Skin is warm and dry.  Neurological:     General: No focal deficit present.     Mental Status: She is alert and oriented to person, place, and time.     Gait: Gait abnormal.  Psychiatric:        Mood and Affect: Mood normal.   Ambulates with R AFO and cane Pt can ambulate without AFO  using a steppage gait  Pain with abd at 90 deg Right shoulder 3-/5 in     Assessment & Plan:  1.  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  Adhesive capsulitis with subacromial impingement syndrome OT, injection  Shoulder injection RIght glenohumeral joint Indication:right Shoulder pain not relieved by medication management and other conservative care.  Informed consent was  obtained after describing risks and benefits of the procedure with the patient, this includes bleeding, bruising, infection and medication side effects. The patient wishes to proceed and has given written consent. Patient was placed in a seated position. The right shoulder was marked and prepped with betadine in the subacromial area. A 25-gauge 1-1/2 inch needle was inserted into the subacromial area. After negative draw back for blood, a solution containing 1 mL of 6 mg per ML betamethasone and 4 mL of 1% lidocaine was injected. A band aid was applied. The patient tolerated the procedure well. Post procedure instructions were given.

## 2018-08-22 NOTE — Progress Notes (Signed)
I agree with the above plan 

## 2018-08-23 NOTE — Telephone Encounter (Signed)
Its on your desk in green folder.

## 2018-08-24 ENCOUNTER — Encounter (HOSPITAL_COMMUNITY): Payer: Self-pay

## 2018-08-26 ENCOUNTER — Ambulatory Visit (HOSPITAL_COMMUNITY): Payer: Self-pay

## 2018-08-30 ENCOUNTER — Other Ambulatory Visit (HOSPITAL_COMMUNITY)
Admission: RE | Admit: 2018-08-30 | Discharge: 2018-08-30 | Disposition: A | Discharge status: Home / Self Care | Payer: Medicaid Other | Source: Ambulatory Visit | Attending: Adult Health | Admitting: Adult Health

## 2018-08-30 DIAGNOSIS — R69 Illness, unspecified: Secondary | ICD-10-CM | POA: Diagnosis present

## 2018-08-30 LAB — BASIC METABOLIC PANEL
ANION GAP: 8 (ref 5–15)
BUN: 14 mg/dL (ref 6–20)
CHLORIDE: 102 mmol/L (ref 98–111)
CO2: 28 mmol/L (ref 22–32)
Calcium: 9.4 mg/dL (ref 8.9–10.3)
Creatinine, Ser: 0.65 mg/dL (ref 0.44–1.00)
GFR calc Af Amer: >60 mL/min (ref >60)
GFR calc non Af Amer: >60 mL/min (ref >60)
GLUCOSE: 99 mg/dL (ref 70–99)
POTASSIUM: 3.7 mmol/L (ref 3.5–5.1)
Sodium: 138 mmol/L (ref 135–145)

## 2018-08-30 LAB — LIPID PANEL
CHOLESTEROL: 241 mg/dL — AB (ref 0–200)
HDL: 70 mg/dL (ref >40)
LDL Cholesterol: 139 mg/dL — ABNORMAL HIGH (ref 0–99)
Total CHOL/HDL Ratio: 3.4 RATIO
Triglycerides: 161 mg/dL — ABNORMAL HIGH (ref <150)
VLDL: 32 mg/dL (ref 0–40)

## 2018-08-31 ENCOUNTER — Ambulatory Visit (HOSPITAL_COMMUNITY): Payer: Self-pay

## 2018-08-31 LAB — CBC AND DIFFERENTIAL
HCT: 36 (ref 36–46)
Hemoglobin: 11.8 — AB (ref 12.0–16.0)
PLATELETS: 396 (ref 150–399)
WBC: 12.2

## 2018-08-31 LAB — BASIC METABOLIC PANEL
BUN: 38 — AB (ref 4–21)
Creatinine: 1.5 — AB (ref 0.5–1.1)
GLUCOSE: 114
Potassium: 4.4 (ref 3.4–5.3)
SODIUM: 145 (ref 137–147)

## 2018-09-01 ENCOUNTER — Encounter: Payer: Self-pay | Admitting: Student in an Organized Health Care Education/Training Program

## 2018-09-02 ENCOUNTER — Ambulatory Visit (HOSPITAL_COMMUNITY): Payer: Self-pay | Admitting: Physical Therapy

## 2018-09-07 ENCOUNTER — Ambulatory Visit (HOSPITAL_COMMUNITY): Payer: Self-pay | Admitting: Physical Therapy

## 2018-09-09 ENCOUNTER — Ambulatory Visit (HOSPITAL_COMMUNITY): Payer: Self-pay

## 2018-09-14 ENCOUNTER — Encounter (HOSPITAL_COMMUNITY): Payer: Self-pay

## 2018-09-14 ENCOUNTER — Ambulatory Visit (HOSPITAL_COMMUNITY): Payer: Self-pay | Admitting: Physical Therapy

## 2018-09-14 ENCOUNTER — Ambulatory Visit (HOSPITAL_COMMUNITY): Payer: Medicaid Other | Admitting: Occupational Therapy

## 2018-09-15 ENCOUNTER — Ambulatory Visit (HOSPITAL_COMMUNITY): Payer: Medicaid Other

## 2018-09-15 ENCOUNTER — Encounter (HOSPITAL_COMMUNITY): Payer: Self-pay

## 2018-09-16 ENCOUNTER — Ambulatory Visit (HOSPITAL_COMMUNITY): Payer: Self-pay | Admitting: Physical Therapy

## 2018-09-21 ENCOUNTER — Ambulatory Visit (HOSPITAL_COMMUNITY): Payer: Medicaid Other | Attending: Internal Medicine

## 2018-09-21 ENCOUNTER — Other Ambulatory Visit: Payer: Self-pay

## 2018-09-21 DIAGNOSIS — M25611 Stiffness of right shoulder, not elsewhere classified: Secondary | ICD-10-CM | POA: Diagnosis present

## 2018-09-21 DIAGNOSIS — R29898 Other symptoms and signs involving the musculoskeletal system: Secondary | ICD-10-CM | POA: Insufficient documentation

## 2018-09-21 DIAGNOSIS — R278 Other lack of coordination: Secondary | ICD-10-CM | POA: Diagnosis present

## 2018-09-21 DIAGNOSIS — M25511 Pain in right shoulder: Secondary | ICD-10-CM | POA: Insufficient documentation

## 2018-09-21 NOTE — Therapy (Signed)
[place], [place], [address] Phone: [phone]   Fax:  [phone]  Occupational Therapy Evaluation  Patient Details  Name: Judy Boyd MRN: [id number] Date of Birth: [birth year] Referring Provider (OT): Dr. Alysia Penna   Encounter Date: 09/21/2018  OT End of Session - 09/21/18 1741    Visit Number  1    Number of Visits  12    Date for OT Re-Evaluation  11/02/18    Authorization Type  Medicaid - requesting 3 visits initially on 09/21/18    OT Start Time  1600    OT Stop Time  1645    OT Time Calculation (min)  45 min    Activity Tolerance  Patient tolerated treatment well    Behavior During Therapy  [facility] for tasks assessed/performed       Past Medical History:  Diagnosis Date  . Anxiety   . Arthritis   . Asthma   . Bowel obstruction ([place])   . Chronic pain   . COPD (chronic obstructive pulmonary disease) ([place])   . Depression   . GERD (gastroesophageal reflux disease)   . Hypertension     Past Surgical History:  Procedure Laterality Date  . ABDOMINAL SURGERY  2003   blockage  . left salpingectomy    . uterine ablation      There were no vitals filed for this visit.  Subjective Assessment - 09/21/18 1607    Subjective   S: I just want to be able to raise my right arm as high as my left.    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 Patient has not received any OT services post CVA. She was recently given a cortisone shot by Dr. Letta Pate. He has referred patient to occupational therapy for evaluation and treatment for Right UE weakness due to left CVA and adhesive capsulitis.     Patient Stated Goals  To be able to use her right arm like her left.     Currently in Pain?  --   5/10 with movement and use       [facility] OT Assessment - 09/21/18 1608      Assessment   Medical  Diagnosis  right side weakness and frozen shoulder S/P CVA    Referring Provider (OT)  Dr. Alysia Penna    Onset Date/Surgical Date  06/14/18    Hand Dominance  Right    Next MD Visit  10/01/2018    Prior Therapy  None for right shoulder. No OT post CVA      Precautions   Precautions  None      Restrictions   Weight Bearing Restrictions  No      Balance Screen   Has the patient fallen in the past 6 months  No      Home  Environment   Family/patient expects to be discharged to:  Private residence    Additional Comments  Lives with 72 year old daughter. Family members stop in for 6 hrs a day to check in on her.     Lives With  Daughter      Prior Function   Level of Independence  Independent with household mobility with device;Needs assistance with homemaking    Driving  No    Vocation  Other (comment)   Filed for disability  ADL   ADL comments  Pt reports difficulty using her RUE for all daily tasks. Increased pain with use. Patient states inability to use her right hand to comb her hair.       Mobility   Mobility Status  Independent      Written Expression   Dominant Hand  Right      Vision - History   Baseline Vision  Wears glasses all the time      Cognition   Overall Cognitive Status  Within Functional Limits for tasks assessed      Observation/Other Assessments   Observations  Pt reports pain with wrist extension. 3rd and 4th digits become stiff in the morning and she usually has to passive stretch before they will move.       Coordination   9 Hole Peg Test  Left;Right    Right 9 Hole Peg Test  37.6"    Left 9 Hole Peg Test  24.4"      ROM / Strength   AROM / PROM / Strength  AROM;PROM;Strength      Palpation   Palpation comment  Minimal fascial restrictions in the right upper arm, trapezius, and scapularis region.       AROM   Overall AROM Comments  Assessed seated. IR/er adducted.     AROM Assessment Site  Shoulder    Right/Left Shoulder  Right     Right Shoulder Flexion  112 Degrees    Right Shoulder ABduction  112 Degrees    Right Shoulder Internal Rotation  90 Degrees    Right Shoulder External Rotation  55 Degrees      PROM   Overall PROM Comments  Assessed supine. IR/er adducted    PROM Assessment Site  Shoulder    Right/Left Shoulder  Right    Right Shoulder Flexion  122 Degrees    Right Shoulder ABduction  110 Degrees    Right Shoulder Internal Rotation  90 Degrees    Right Shoulder External Rotation  41 Degrees      Strength   Overall Strength Comments  Assessed seated. IR/er adducted    Strength Assessment Site  Shoulder;Hand    Right/Left Shoulder  Right    Right Shoulder Flexion  4/5    Right Shoulder ABduction  4/5    Right Shoulder Internal Rotation  4/5    Right Shoulder External Rotation  3+/5    Right/Left hand  Left;Right    Right Hand Grip (lbs)  20    Right Hand Lateral Pinch  10 lbs    Right Hand 3 Point Pinch  7 lbs    Left Hand Grip (lbs)  60    Left Hand Lateral Pinch  18 lbs    Left Hand 3 Point Pinch  13 lbs                      OT Education - 09/21/18 1656    Education Details  AA/ROM shoulder exercises. yellow theraputty: hand strength    Person(s) Educated  Patient    Methods  Explanation;Demonstration;Verbal cues;Handout    Comprehension  Verbalized understanding;Returned demonstration       OT Short Term Goals - 09/21/18 1745      OT SHORT TERM GOAL #1   Title  Patient will be educated and independent with HEP to increase her ability to use her RUE as her dominant extremity for all daily tasks.     Time  6  Period  Weeks    Status  New    Target Date  11/02/18      OT SHORT TERM GOAL #2   Title  Patient will increase RUE A/ROM to [facility] in order to achieve the desired ROM to comb and fix her hair.     Time  6    Period  Weeks    Status  New      OT SHORT TERM GOAL #3   Title  Patient will increase her right shoulder strength to 4+/5 in order to return to  holding and carring lightweight household items.    Time  6    Period  Weeks    Status  New      OT SHORT TERM GOAL #4   Title  Patient will increase her right hand grip strength by 15# and pinch strength by 10# in order to hold onto and manipulate every day objects without dropping them.     Time  6    Period  Weeks    Status  New      OT SHORT TERM GOAL #5   Title  Patient will increase her right hand coordination by completing the 9 hole peg test in 26 seconds or less in order to increase fluidity of motor movements required to completed daily tasks.     Time  6    Period  Weeks    Status  New      Additional Short Term Goals   Additional Short Term Goals  Yes      OT SHORT TERM GOAL #6   Title  Patient will report a decrease in right shoulder pain of approximately 3/10 when completing functional reaching tasks.     Time  6    Period  Weeks    Status  New      OT SHORT TERM GOAL #7   Title  Patient will decrease fascial restrictions to trace amount in the right shoulder in order to increase functional mobility needed for reaching tasks.     Time  6    Period  Weeks    Status  New               Plan - 09/21/18 1742    Clinical Impression Statement  A: Patient is a 58 y/o female S/P left CVA causing right side weakness, adhesive capsulitis, decreased ROM, and coordination resulting in difficulty completing her daily tasks using her right UE as her dominant extremity.     Occupational Profile and client history currently impacting functional performance  Pt is motivated to increase her independence of using her RUE.    Occupational performance deficits (Please refer to evaluation for details):  ADL's;Rest and Sleep;Leisure    Rehab Potential  Excellent    Current Impairments/barriers affecting progress:  N/A    OT Frequency  2x / week    OT Duration  6 weeks    OT Treatment/Interventions  Self-care/ADL training;Moist Heat;DME and/or AE  instruction;Splinting;Therapeutic activities;Therapeutic exercise;Ultrasound;Cryotherapy;Electrical Stimulation;Paraffin;Manual Therapy;Patient/family education;Passive range of motion;Neuromuscular education    Plan  P: Patient will benefit from skilled OT services to increase functional performance when using her RUE as her dominant. Treatment Plan: myofascial release, manual stretching, AA/ROM, A/ROM, general shoulder and scapular strengthening, grip and pinch strengthening, coordination.     Clinical Decision Making  Several treatment options, min-mod task modification necessary    Consulted and Agree with Plan of Care  Patient  Patient will benefit from skilled therapeutic intervention in order to improve the following deficits and impairments:  Decreased coordination, Decreased range of motion, Increased fascial restrictions, Pain, Impaired UE functional use, Decreased strength  Visit Diagnosis: Other symptoms and signs involving the musculoskeletal system - Plan: Ot plan of care cert/re-cert  Other lack of coordination - Plan: Ot plan of care cert/re-cert  Stiffness of right shoulder, not elsewhere classified - Plan: Ot plan of care cert/re-cert  Acute pain of right shoulder - Plan: Ot plan of care cert/re-cert    Problem List Patient Active Problem List   Diagnosis Date Noted  . Dental disease 07/02/2018  . Hemiparesis affecting right side as late effect of stroke ([place]) 06/17/2018  . Stroke (cerebrum) ([place]) 06/17/2018  . H/O medication noncompliance   . Chronic pain syndrome   . Leukocytosis   . Acute ischemic stroke ([place]) 06/13/2018  . Tobacco abuse 06/13/2018  . Lacunar stroke ([place]) 06/12/2018  . Hypertension   . COPD (chronic obstructive pulmonary disease) ([place])   . GERD (gastroesophageal reflux disease) 04/06/2012  . Abdominal pain 04/06/2012  . Nausea & vomiting 04/06/2012  . Melena 04/06/2012   Ailene Ravel, OTR/L,CBIS  [phone]  09/21/2018, 5:50  PM  [facility] [address] [place], [place], [address] Phone: [phone]   Fax:  [phone]  Name: STEHANIE EKSTROM MRN: [id number] Date of Birth: 02-May-1961

## 2018-09-21 NOTE — Patient Instructions (Signed)
Perform each exercise ____10-15____ reps. 2-3x days.  Remember to not tuck chin.   Protraction - sitting  Start by holding a wand or cane at chest height.  Next, slowly push the wand outwards in front of your body so that your elbows become fully straightened. Then, return to the original position.     Shoulder FLEXION - Sitting - PALMS DOWN  hold a wand/cane with both arms, palms down on both sides. Raise up the wand/cane allowing your unaffected arm to perform most of the effort. Your affected arm should be partially relaxed.      Internal/External ROTATION - sitting   In the standing position, hold a wand/cane with both hands keeping your elbows bent. Move your arms and wand/cane to one side.  Your affected arm should be partially relaxed while your unaffected arm performs most of the effort.       Shoulder ABDUCTION - sitting  While holding a wand/cane palm face up on the injured side and palm face down on the uninjured side, slowly raise up your injured arm to the side.        Horizontal Abduction/Adduction      Straight arms holding cane at shoulder height, bring cane to right, center, left. Repeat starting to left.   Copyright  VHI. All rights reserved.   Home Exercises Program Theraputty Exercises  Do the following exercises 2-3 times a day using your affected hand.  1. Roll putty into a ball.  2. Make into a pancake.  3. Roll putty into a roll.  4. Pinch along log with first finger and thumb.   5. Make into a ball.  6. Roll it back into a log.   7. Pinch using thumb and side of first finger.  8. Roll into a ball, then flatten into a pancake.  9. Using your fingers, make putty into a mountain.  10. Roll putty into a ball. Squeeze and release 10 times.

## 2018-09-28 ENCOUNTER — Encounter (HOSPITAL_COMMUNITY): Payer: Self-pay | Admitting: Occupational Therapy

## 2018-09-28 ENCOUNTER — Ambulatory Visit (HOSPITAL_COMMUNITY): Payer: Medicaid Other | Admitting: Occupational Therapy

## 2018-09-28 DIAGNOSIS — R278 Other lack of coordination: Secondary | ICD-10-CM

## 2018-09-28 DIAGNOSIS — M25511 Pain in right shoulder: Secondary | ICD-10-CM

## 2018-09-28 DIAGNOSIS — R29898 Other symptoms and signs involving the musculoskeletal system: Secondary | ICD-10-CM | POA: Diagnosis not present

## 2018-09-28 DIAGNOSIS — M25611 Stiffness of right shoulder, not elsewhere classified: Secondary | ICD-10-CM

## 2018-09-28 NOTE — Therapy (Signed)
[place], [place], [address] Phone: [phone]   Fax:  [phone]  Occupational Therapy Treatment  Patient Details  Name: Judy Boyd MRN: [id number] Date of Birth: 1961-05-20 Referring Provider (OT): Dr. Alysia Penna   Encounter Date: 09/28/2018  OT End of Session - 09/28/18 1707    Visit Number  2    Number of Visits  12    Date for OT Re-Evaluation  11/02/18    Authorization Type  Medicaid    Authorization Time Period  3 visits approved 2/11-2/24/2020    Authorization - Visit Number  1    Authorization - Number of Visits  3    OT Start Time  0712    OT Stop Time  1726    OT Time Calculation (min)  41 min    Activity Tolerance  Patient tolerated treatment well    Behavior During Therapy  [facility] for tasks assessed/performed       Past Medical History:  Diagnosis Date  . Anxiety   . Arthritis   . Asthma   . Bowel obstruction ([place])   . Chronic pain   . COPD (chronic obstructive pulmonary disease) ([employer])   . Depression   . GERD (gastroesophageal reflux disease)   . Hypertension     Past Surgical History:  Procedure Laterality Date  . ABDOMINAL SURGERY  2003   blockage  . left salpingectomy    . uterine ablation      There were no vitals filed for this visit.  Subjective Assessment - 09/28/18 1644    Subjective   S: It's been better since she gave me the putty.     Currently in Pain?  No/denies         [facility] OT Assessment - 09/28/18 1643      Assessment   Medical Diagnosis  right side weakness and frozen shoulder S/P CVA      Precautions   Precautions  None               OT Treatments/Exercises (OP) - 09/28/18 1648      Exercises   Exercises  Shoulder;Hand      Shoulder Exercises: Supine   Protraction  PROM;5 reps;AAROM;10 reps    Horizontal ABduction  PROM;5 reps;AAROM;10 reps    External Rotation  PROM;5 reps;AAROM;10 reps    Internal Rotation  PROM;5  reps;AAROM;10 reps    Flexion  PROM;5 reps;AAROM;10 reps    ABduction  PROM;5 reps;AAROM;10 reps      Shoulder Exercises: Seated   Extension  AROM;10 reps    Row  AROM;10 reps    Protraction  AAROM;10 reps    Horizontal ABduction  AAROM;10 reps    External Rotation  AAROM;10 reps    Internal Rotation  AAROM;10 reps    Flexion  AAROM;10 reps    Abduction  AAROM;10 reps      Shoulder Exercises: Pulleys   Flexion  1 minute      Hand Exercises   Hand Gripper with Large Beads  all beads gripper at 15#    Hand Gripper with Medium Beads  all beads gripper at 15#    Sponges  7, 12      Manual Therapy   Manual Therapy  Myofascial release    Manual therapy comments  completed separately from therapeutic exercises    Myofascial Release  myofascial release to right upper arm, trapezius, and scapularis regions to decrease pain and fascial  restrictions and increase joint ROM               OT Short Term Goals - 09/28/18 1644      OT SHORT TERM GOAL #1   Title  Patient will be educated and independent with HEP to increase her ability to use her RUE as her dominant extremity for all daily tasks.     Time  6    Period  Weeks    Status  On-going    Target Date  11/02/18      OT SHORT TERM GOAL #2   Title  Patient will increase RUE A/ROM to [facility] in order to achieve the desired ROM to comb and fix her hair.     Time  6    Period  Weeks    Status  On-going      OT SHORT TERM GOAL #3   Title  Patient will increase her right shoulder strength to 4+/5 in order to return to holding and carring lightweight household items.    Time  6    Period  Weeks    Status  On-going      OT SHORT TERM GOAL #4   Title  Patient will increase her right hand grip strength by 15# and pinch strength by 10# in order to hold onto and manipulate every day objects without dropping them.     Time  6    Period  Weeks    Status  On-going      OT SHORT TERM GOAL #5   Title  Patient will increase her right  hand coordination by completing the 9 hole peg test in 26 seconds or less in order to increase fluidity of motor movements required to completed daily tasks.     Time  6    Period  Weeks    Status  On-going      OT SHORT TERM GOAL #6   Title  Patient will report a decrease in right shoulder pain of approximately 3/10 when completing functional reaching tasks.     Time  6    Period  Weeks    Status  On-going      OT SHORT TERM GOAL #7   Title  Patient will decrease fascial restrictions to trace amount in the right shoulder in order to increase functional mobility needed for reaching tasks.     Time  6    Period  Weeks    Status  On-going               Plan - 09/28/18 1701    Clinical Impression Statement  A: Pt reporting improvement in ability to brush hair since evaluation and working on HEP. Initiated myofascial release and manual therapy working to decrease fascial restrictions and improve mobility in RUE. Pt completed AA/ROM in supine and sitting today, as well as scapular A/ROM and pulleys. Initiated grip strengthening at end of session, attempted with gripper at 18# however pt unable to complete so reduced to 15#. Verbal cuing for form and technique with exercises.     Plan  P: Add PVC pipe slide, attempt wall wash, and continue with grip strengthening       Patient will benefit from skilled therapeutic intervention in order to improve the following deficits and impairments:  Decreased coordination, Decreased range of motion, Increased fascial restrictions, Pain, Impaired UE functional use, Decreased strength  Visit Diagnosis: Other symptoms and signs involving the musculoskeletal system  Other lack of  coordination  Stiffness of right shoulder, not elsewhere classified  Acute pain of right shoulder    Problem List Patient Active Problem List   Diagnosis Date Noted  . Dental disease 07/02/2018  . Hemiparesis affecting right side as late effect of stroke ([place])  06/17/2018  . Stroke (cerebrum) ([place]) 06/17/2018  . H/O medication noncompliance   . Chronic pain syndrome   . Leukocytosis   . Acute ischemic stroke ([place]) 06/13/2018  . Tobacco abuse 06/13/2018  . Lacunar stroke ([place]) 06/12/2018  . Hypertension   . COPD (chronic obstructive pulmonary disease) ([place])   . GERD (gastroesophageal reflux disease) 04/06/2012  . Abdominal pain 04/06/2012  . Nausea & vomiting 04/06/2012  . Melena 04/06/2012   Guadelupe Sabin, OTR/L  [phone] 09/28/2018, 5:32 PM  [place] [address] [place], [place], [address] Phone: [phone]   Fax:  [phone]  Name: Judy Boyd MRN: [id number] Date of Birth: Aug 14, 1961

## 2018-09-29 ENCOUNTER — Ambulatory Visit (HOSPITAL_COMMUNITY): Payer: Medicaid Other | Admitting: Occupational Therapy

## 2018-09-29 DIAGNOSIS — M25511 Pain in right shoulder: Secondary | ICD-10-CM

## 2018-09-29 DIAGNOSIS — R29898 Other symptoms and signs involving the musculoskeletal system: Secondary | ICD-10-CM

## 2018-09-29 DIAGNOSIS — R278 Other lack of coordination: Secondary | ICD-10-CM

## 2018-09-29 DIAGNOSIS — M25611 Stiffness of right shoulder, not elsewhere classified: Secondary | ICD-10-CM

## 2018-09-29 NOTE — Therapy (Signed)
[place], [place], [address] Phone: [phone]   Fax:  [phone]  Occupational Therapy Treatment  Patient Details  Name: Judy Boyd MRN: [id number] Date of Birth: 09/09/1960 Referring Provider (OT): Dr. Alysia Penna   Encounter Date: 09/29/2018  OT End of Session - 09/29/18 1649    Visit Number  3    Number of Visits  12    Date for OT Re-Evaluation  11/02/18    Authorization Type  Medicaid    Authorization Time Period  3 visits approved 2/11-2/24/2020    Authorization - Visit Number  2    Authorization - Number of Visits  3    OT Start Time  1602    OT Stop Time  2620    OT Time Calculation (min)  43 min    Activity Tolerance  Patient tolerated treatment well    Behavior During Therapy  [facility] for tasks assessed/performed       Past Medical History:  Diagnosis Date  . Anxiety   . Arthritis   . Asthma   . Bowel obstruction ([place])   . Chronic pain   . COPD (chronic obstructive pulmonary disease) ([place])   . Depression   . GERD (gastroesophageal reflux disease)   . Hypertension     Past Surgical History:  Procedure Laterality Date  . ABDOMINAL SURGERY  2003   blockage  . left salpingectomy    . uterine ablation      There were no vitals filed for this visit.  Subjective Assessment - 09/29/18 1604    Subjective   S: This right hand does what it wants to.     Currently in Pain?  No/denies                   OT Treatments/Exercises (OP) - 09/29/18 1605      Exercises   Exercises  Shoulder;Hand      Shoulder Exercises: Supine   Protraction  PROM;5 reps;AAROM;10 reps    Horizontal ABduction  PROM;5 reps;AAROM;10 reps    External Rotation  PROM;5 reps;AAROM;10 reps    Internal Rotation  PROM;5 reps;AAROM;10 reps    Flexion  PROM;5 reps;AAROM;10 reps    ABduction  PROM;5 reps;AAROM;10 reps      Shoulder Exercises: Seated   Protraction  AAROM;10 reps    Horizontal  ABduction  AAROM;10 reps    External Rotation  AAROM;10 reps    Internal Rotation  AAROM;10 reps    Flexion  AAROM;10 reps    Abduction  AAROM;10 reps      Shoulder Exercises: ROM/Strengthening   Wall Wash  1'    Other ROM/Strengthening Exercises  PVC pipe slide, 10X flexion      Hand Exercises   Hand Gripper with Large Beads  all beads gripper at 15#    Hand Gripper with Medium Beads  all beads gripper at 15#    Other Hand Exercises  Pt used red clothespin to grasp and stack 3 stacks of 5 high resistance sponges. Min difficulty squeezing clothespin using right hand in a four fingers pinch. Mod difficulty with coordination required for task.       Manual Therapy   Manual Therapy  Myofascial release    Manual therapy comments  completed separately from therapeutic exercises    Myofascial Release  myofascial release to right upper arm, trapezius, and scapularis regions to decrease pain and fascial restrictions and increase joint ROM  OT Short Term Goals - 09/28/18 1644      OT SHORT TERM GOAL #1   Title  Patient will be educated and independent with HEP to increase her ability to use her RUE as her dominant extremity for all daily tasks.     Time  6    Period  Weeks    Status  On-going    Target Date  11/02/18      OT SHORT TERM GOAL #2   Title  Patient will increase RUE A/ROM to [facility] in order to achieve the desired ROM to comb and fix her hair.     Time  6    Period  Weeks    Status  On-going      OT SHORT TERM GOAL #3   Title  Patient will increase her right shoulder strength to 4+/5 in order to return to holding and carring lightweight household items.    Time  6    Period  Weeks    Status  On-going      OT SHORT TERM GOAL #4   Title  Patient will increase her right hand grip strength by 15# and pinch strength by 10# in order to hold onto and manipulate every day objects without dropping them.     Time  6    Period  Weeks    Status  On-going       OT SHORT TERM GOAL #5   Title  Patient will increase her right hand coordination by completing the 9 hole peg test in 26 seconds or less in order to increase fluidity of motor movements required to completed daily tasks.     Time  6    Period  Weeks    Status  On-going      OT SHORT TERM GOAL #6   Title  Patient will report a decrease in right shoulder pain of approximately 3/10 when completing functional reaching tasks.     Time  6    Period  Weeks    Status  On-going      OT SHORT TERM GOAL #7   Title  Patient will decrease fascial restrictions to trace amount in the right shoulder in order to increase functional mobility needed for reaching tasks.     Time  6    Period  Weeks    Status  On-going               Plan - 09/29/18 1617    Clinical Impression Statement  A: Continued with manual therapy to address muscle knot palpated in right trapezius and along medial border of scapula. AA/ROM completed supine and seated, cuing for form. Continued with grip strengthening and initiated pinch task. Increased time required for pinch task due to coordination deficits. Verbal cuing for form and technique during session.     Plan  P: Request additional medicaid visits. Increase to green clothespin for pinch task, continue with RUE strengthening       Patient will benefit from skilled therapeutic intervention in order to improve the following deficits and impairments:  Decreased coordination, Decreased range of motion, Increased fascial restrictions, Pain, Impaired UE functional use, Decreased strength  Visit Diagnosis: Other symptoms and signs involving the musculoskeletal system  Other lack of coordination  Stiffness of right shoulder, not elsewhere classified  Acute pain of right shoulder    Problem List Patient Active Problem List   Diagnosis Date Noted  . Dental disease 07/02/2018  . Hemiparesis affecting  right side as late effect of stroke ([place]) 06/17/2018  . Stroke  (cerebrum) ([place]) 06/17/2018  . H/O medication noncompliance   . Chronic pain syndrome   . Leukocytosis   . Acute ischemic stroke ([place]) 06/13/2018  . Tobacco abuse 06/13/2018  . Lacunar stroke ([place]) 06/12/2018  . Hypertension   . COPD (chronic obstructive pulmonary disease) ([place])   . GERD (gastroesophageal reflux disease) 04/06/2012  . Abdominal pain 04/06/2012  . Nausea & vomiting 04/06/2012  . Melena 04/06/2012   Guadelupe Sabin, OTR/L  [phone] 09/29/2018, 4:51 PM  [place] [address] [place], [place], [address] Phone: [phone]   Fax:  [phone]  Name: RASHANNA CHRISTIANA MRN: [id number] Date of Birth: 12-05-1960

## 2018-09-30 ENCOUNTER — Ambulatory Visit (HOSPITAL_COMMUNITY): Payer: Medicaid Other

## 2018-10-05 ENCOUNTER — Ambulatory Visit (HOSPITAL_COMMUNITY): Payer: Medicaid Other | Admitting: Occupational Therapy

## 2018-10-05 ENCOUNTER — Encounter (HOSPITAL_COMMUNITY): Payer: Self-pay | Admitting: Occupational Therapy

## 2018-10-05 DIAGNOSIS — R29898 Other symptoms and signs involving the musculoskeletal system: Secondary | ICD-10-CM

## 2018-10-05 DIAGNOSIS — M25511 Pain in right shoulder: Secondary | ICD-10-CM

## 2018-10-05 DIAGNOSIS — R278 Other lack of coordination: Secondary | ICD-10-CM

## 2018-10-05 DIAGNOSIS — M25611 Stiffness of right shoulder, not elsewhere classified: Secondary | ICD-10-CM

## 2018-10-05 NOTE — Therapy (Signed)
[place], [place], [address] Phone: [phone]   Fax:  [phone]  Occupational Therapy Treatment  Patient Details  Name: Judy Boyd MRN: [id number] Date of Birth: [birth year] Referring Provider (OT): Dr. Alysia Penna   Encounter Date: 10/05/2018  OT End of Session - 10/05/18 1725    Visit Number  4    Number of Visits  12    Date for OT Re-Evaluation  11/02/18    Authorization Type  Medicaid    Authorization Time Period  3 visits approved 2/11-2/24/2020; Requesting 8 additional visits    Authorization - Visit Number  3    Authorization - Number of Visits  3    OT Start Time  9794    OT Stop Time  1728    OT Time Calculation (min)  41 min    Activity Tolerance  Patient tolerated treatment well    Behavior During Therapy  [facility] for tasks assessed/performed       Past Medical History:  Diagnosis Date  . Anxiety   . Arthritis   . Asthma   . Bowel obstruction ([place])   . Chronic pain   . COPD (chronic obstructive pulmonary disease) ([place])   . Depression   . GERD (gastroesophageal reflux disease)   . Hypertension     Past Surgical History:  Procedure Laterality Date  . ABDOMINAL SURGERY  2003   blockage  . left salpingectomy    . uterine ablation      There were no vitals filed for this visit.  Subjective Assessment - 10/05/18 1647    Subjective   S: I can sleep on that side now.      Currently in Pain?  No/denies         [facility] OT Assessment - 10/05/18 1647      Assessment   Medical Diagnosis  right side weakness and frozen shoulder S/P CVA      Precautions   Precautions  None               OT Treatments/Exercises (OP) - 10/05/18 1647      Exercises   Exercises  Shoulder;Hand      Shoulder Exercises: Supine   Protraction  PROM;5 reps;AAROM;10 reps    Horizontal ABduction  PROM;5 reps;AAROM;10 reps    External Rotation  PROM;5 reps;AAROM;10 reps    Internal  Rotation  PROM;5 reps;AAROM;10 reps    Flexion  PROM;5 reps;AAROM;10 reps    ABduction  PROM;5 reps;AAROM;10 reps      Shoulder Exercises: Seated   Protraction  AAROM;10 reps    Horizontal ABduction  AAROM;10 reps    External Rotation  AAROM;10 reps    Internal Rotation  AAROM;10 reps    Flexion  AAROM;10 reps    Abduction  AAROM;10 reps      Hand Exercises   Hand Gripper with Large Beads  all beads gripper at 15#    Hand Gripper with Medium Beads  all beads gripper at 15#    Sponges  13, 14    Other Hand Exercises  Pt used red clothespin to grasp and place 20 high resistance sponges into bucket. Min difficulty. Attempted green clothespin however pt unable to complete.                        Fine Motor Coordination (Hand/Wrist)   Fine Motor Coordination  Manipulating coins    Manipulating coins  Pt holding 8 and 12 coins in palm and working on palm to fingertip translation before dropping in slotted container. Pt with mod difficulty manipulation coins, max difficulty keeping stack of coins in palm during translation.                OT Short Term Goals - 09/28/18 1644      OT SHORT TERM GOAL #1   Title  Patient will be educated and independent with HEP to increase her ability to use her RUE as her dominant extremity for all daily tasks.     Time  6    Period  Weeks    Status  On-going    Target Date  11/02/18      OT SHORT TERM GOAL #2   Title  Patient will increase RUE A/ROM to [facility] in order to achieve the desired ROM to comb and fix her hair.     Time  6    Period  Weeks    Status  On-going      OT SHORT TERM GOAL #3   Title  Patient will increase her right shoulder strength to 4+/5 in order to return to holding and carring lightweight household items.    Time  6    Period  Weeks    Status  On-going      OT SHORT TERM GOAL #4   Title  Patient will increase her right hand grip strength by 15# and pinch strength by 10# in order to hold onto and manipulate  every day objects without dropping them.     Time  6    Period  Weeks    Status  On-going      OT SHORT TERM GOAL #5   Title  Patient will increase her right hand coordination by completing the 9 hole peg test in 26 seconds or less in order to increase fluidity of motor movements required to completed daily tasks.     Time  6    Period  Weeks    Status  On-going      OT SHORT TERM GOAL #6   Title  Patient will report a decrease in right shoulder pain of approximately 3/10 when completing functional reaching tasks.     Time  6    Period  Weeks    Status  On-going      OT SHORT TERM GOAL #7   Title  Patient will decrease fascial restrictions to trace amount in the right shoulder in order to increase functional mobility needed for reaching tasks.     Time  6    Period  Weeks    Status  On-going               Plan - 10/05/18 1713    Clinical Impression Statement  A: No manual therapy completed today as pt reports shoulder is feeling much improved today. Pt able to tolerate slight increase in range during passive stretching, er is most difficult. Continued with AA/ROM, grip and pinch strengthening. Added fine motor task withconin manipulation, pt with mod difficulty; attempted green clothespin with pinch tasks however pt unable to complete. Verbal cuing for form and technique.     Plan  P: Check for medicaid approval. add small pegboard task working on coordination       Patient will benefit from skilled therapeutic intervention in order to improve the following deficits and impairments:  Decreased coordination, Decreased range of motion, Increased fascial restrictions, Pain, Impaired  UE functional use, Decreased strength  Visit Diagnosis: Other symptoms and signs involving the musculoskeletal system  Other lack of coordination  Stiffness of right shoulder, not elsewhere classified  Acute pain of right shoulder    Problem List Patient Active Problem List   Diagnosis  Date Noted  . Dental disease 07/02/2018  . Hemiparesis affecting right side as late effect of stroke ([place]) 06/17/2018  . Stroke (cerebrum) ([place]) 06/17/2018  . H/O medication noncompliance   . Chronic pain syndrome   . Leukocytosis   . Acute ischemic stroke ([place]) 06/13/2018  . Tobacco abuse 06/13/2018  . Lacunar stroke ([place]) 06/12/2018  . Hypertension   . COPD (chronic obstructive pulmonary disease) ([place])   . GERD (gastroesophageal reflux disease) 04/06/2012  . Abdominal pain 04/06/2012  . Nausea & vomiting 04/06/2012  . Melena 04/06/2012   Guadelupe Sabin, OTR/L  [phone] 10/05/2018, 5:28 PM  [facility] [address] [place], [place], [address] Phone: [phone]   Fax:  [phone]  Name: Judy Boyd MRN: [id number] Date of Birth: 05-26-1961

## 2018-10-07 ENCOUNTER — Telehealth (HOSPITAL_COMMUNITY): Payer: Self-pay | Admitting: General Practice

## 2018-10-07 ENCOUNTER — Ambulatory Visit (HOSPITAL_COMMUNITY): Payer: Medicaid Other

## 2018-10-07 NOTE — Telephone Encounter (Signed)
10/07/18  visit not approved by Medicaid - called and left patient a message that we needd to cx today because of this

## 2018-10-12 ENCOUNTER — Ambulatory Visit (HOSPITAL_COMMUNITY): Payer: Medicaid Other | Admitting: Occupational Therapy

## 2018-10-12 ENCOUNTER — Encounter (HOSPITAL_COMMUNITY): Payer: Self-pay | Admitting: Occupational Therapy

## 2018-10-12 DIAGNOSIS — M25611 Stiffness of right shoulder, not elsewhere classified: Secondary | ICD-10-CM

## 2018-10-12 DIAGNOSIS — R29898 Other symptoms and signs involving the musculoskeletal system: Secondary | ICD-10-CM | POA: Diagnosis not present

## 2018-10-12 DIAGNOSIS — R278 Other lack of coordination: Secondary | ICD-10-CM

## 2018-10-12 NOTE — Therapy (Signed)
[place], [place], [address] Phone: [phone]   Fax:  [phone]  Occupational Therapy Treatment  Patient Details  Name: Judy Boyd MRN: [id number] Date of Birth: 1961/06/28 Referring Provider (OT): Dr. Alysia Penna   Encounter Date: 10/12/2018  OT End of Session - 10/12/18 1726    Visit Number  5    Number of Visits  12    Date for OT Re-Evaluation  11/02/18    Authorization Type  Medicaid    Authorization Time Period  8 visits approved 2/25-3/23    Authorization - Visit Number  1    Authorization - Number of Visits  8    OT Start Time  6962    OT Stop Time  1731    OT Time Calculation (min)  43 min    Activity Tolerance  Patient tolerated treatment well    Behavior During Therapy  [facility] for tasks assessed/performed       Past Medical History:  Diagnosis Date  . Anxiety   . Arthritis   . Asthma   . Bowel obstruction ([place])   . Chronic pain   . COPD (chronic obstructive pulmonary disease) ([place])   . Depression   . GERD (gastroesophageal reflux disease)   . Hypertension     Past Surgical History:  Procedure Laterality Date  . ABDOMINAL SURGERY  2003   blockage  . left salpingectomy    . uterine ablation      There were no vitals filed for this visit.  Subjective Assessment - 10/12/18 1647    Subjective   S: It mostly only hurts at nighttime.     Currently in Pain?  No/denies         [facility] OT Assessment - 10/12/18 1647      Assessment   Medical Diagnosis  right side weakness and frozen shoulder S/P CVA      Precautions   Precautions  None               OT Treatments/Exercises (OP) - 10/12/18 1650      Exercises   Exercises  Shoulder;Hand      Shoulder Exercises: Supine   Protraction  PROM;5 reps;AAROM;10 reps    Horizontal ABduction  PROM;5 reps;AAROM;10 reps    External Rotation  PROM;5 reps;AAROM;10 reps    Internal Rotation  PROM;5 reps;AAROM;10 reps     Flexion  PROM;5 reps;AAROM;10 reps    ABduction  PROM;5 reps;AAROM;10 reps      Hand Exercises   Hand Gripper with Large Beads  all beads gripper at 20#    Hand Gripper with Medium Beads  all beads gripper at 20#      Fine Motor Coordination (Hand/Wrist)   Fine Motor Coordination  Small Pegboard    Small Pegboard  Pt holding pegs in palm of hand and working on in-hand manipulation before placing in pegboard. Mod difficulty with in-hand manipulation, once at fingertips pt able to place in pegboard with minimal difficulty. Increased time required for completion               OT Short Term Goals - 09/28/18 1644      OT SHORT TERM GOAL #1   Title  Patient will be educated and independent with HEP to increase her ability to use her RUE as her dominant extremity for all daily tasks.     Time  6    Period  Weeks    Status  On-going    Target Date  11/02/18      OT SHORT TERM GOAL #2   Title  Patient will increase RUE A/ROM to [facility] in order to achieve the desired ROM to comb and fix her hair.     Time  6    Period  Weeks    Status  On-going      OT SHORT TERM GOAL #3   Title  Patient will increase her right shoulder strength to 4+/5 in order to return to holding and carring lightweight household items.    Time  6    Period  Weeks    Status  On-going      OT SHORT TERM GOAL #4   Title  Patient will increase her right hand grip strength by 15# and pinch strength by 10# in order to hold onto and manipulate every day objects without dropping them.     Time  6    Period  Weeks    Status  On-going      OT SHORT TERM GOAL #5   Title  Patient will increase her right hand coordination by completing the 9 hole peg test in 26 seconds or less in order to increase fluidity of motor movements required to completed daily tasks.     Time  6    Period  Weeks    Status  On-going      OT SHORT TERM GOAL #6   Title  Patient will report a decrease in right shoulder pain of approximately 3/10  when completing functional reaching tasks.     Time  6    Period  Weeks    Status  On-going      OT SHORT TERM GOAL #7   Title  Patient will decrease fascial restrictions to trace amount in the right shoulder in order to increase functional mobility needed for reaching tasks.     Time  6    Period  Weeks    Status  On-going               Plan - 10/12/18 1724    Clinical Impression Statement  A: Continued with P/ROM today, pt with improvement in range-able to tolerate ROM at 75% before pain and tightness increased. Increased hand gripper resistance today, also added pegboard task for coordination, pt has increased difficulty with in-hand manipulation and palm to fingertip translation. Verbal cuing for form and technique.     Plan  P: Continue with shoulder ROM, increase repetitions to 12. Continue working on Product/process development scientist        Patient will benefit from skilled therapeutic intervention in order to improve the following deficits and impairments:  Decreased coordination, Decreased range of motion, Increased fascial restrictions, Pain, Impaired UE functional use, Decreased strength  Visit Diagnosis: Other symptoms and signs involving the musculoskeletal system  Other lack of coordination  Stiffness of right shoulder, not elsewhere classified    Problem List Patient Active Problem List   Diagnosis Date Noted  . Dental disease 07/02/2018  . Hemiparesis affecting right side as late effect of stroke ([place]) 06/17/2018  . Stroke (cerebrum) ([place]) 06/17/2018  . H/O medication noncompliance   . Chronic pain syndrome   . Leukocytosis   . Acute ischemic stroke ([place]) 06/13/2018  . Tobacco abuse 06/13/2018  . Lacunar stroke ([place]) 06/12/2018  . Hypertension   . COPD (chronic obstructive pulmonary disease) ([place])   . GERD (gastroesophageal reflux disease) 04/06/2012  . Abdominal pain 04/06/2012  .  Nausea & vomiting 04/06/2012  . Melena 04/06/2012   Guadelupe Sabin, OTR/L   [phone] 10/12/2018, 5:31 PM  [facility] [address] [place], [place], [address] Phone: [phone]   Fax:  [phone]  Name: Judy Boyd MRN: [id number] Date of Birth: 04/30/1961

## 2018-10-13 ENCOUNTER — Other Ambulatory Visit (HOSPITAL_COMMUNITY): Payer: Self-pay | Admitting: Neurosurgery

## 2018-10-13 DIAGNOSIS — I671 Cerebral aneurysm, nonruptured: Secondary | ICD-10-CM

## 2018-10-14 ENCOUNTER — Ambulatory Visit (HOSPITAL_COMMUNITY): Payer: Medicaid Other

## 2018-10-15 ENCOUNTER — Other Ambulatory Visit: Payer: Self-pay | Admitting: Neurosurgery

## 2018-10-18 ENCOUNTER — Encounter (HOSPITAL_COMMUNITY): Payer: Self-pay

## 2018-10-18 ENCOUNTER — Ambulatory Visit (HOSPITAL_COMMUNITY): Payer: Medicaid Other | Attending: Internal Medicine

## 2018-10-18 DIAGNOSIS — M25611 Stiffness of right shoulder, not elsewhere classified: Secondary | ICD-10-CM | POA: Diagnosis present

## 2018-10-18 DIAGNOSIS — R278 Other lack of coordination: Secondary | ICD-10-CM | POA: Insufficient documentation

## 2018-10-18 DIAGNOSIS — R29898 Other symptoms and signs involving the musculoskeletal system: Secondary | ICD-10-CM | POA: Diagnosis present

## 2018-10-18 DIAGNOSIS — M25511 Pain in right shoulder: Secondary | ICD-10-CM | POA: Insufficient documentation

## 2018-10-18 NOTE — Patient Instructions (Signed)
Repeat all exercises 10-15 times, 1-2 times per day. Complete sitting or standing.   1) Shoulder Protraction    Begin with elbows by your side, slowly "punch" straight out in front of you.      2) Shoulder Flexion   Standing:         Begin with arms at your side with thumbs pointed up, slowly raise both arms up and forward towards overhead.          3) Horizontal abduction/adduction   Standing:           Begin with arms straight out in front of you, bring out to the side in at "T" shape. Keep arms straight entire time.      4) Internal & External Rotation    *No band* -Stand with elbows at the side and elbows bent 90 degrees. Move your forearms away from your body, then bring back inward toward the body.     5) Shoulder Abduction  Standing:       Begin with your arms flat on the table next to your side. Slowly move your arms out to the side so that they go overhead, in a jumping jack or snow angel movement.

## 2018-10-18 NOTE — Therapy (Signed)
[place], [place], [address] Phone: [phone]   Fax:  [phone]  Occupational Therapy Treatment  Patient Details  Name: Judy Boyd MRN: [id number] Date of Birth: [birth year] Referring Provider (OT): Dr. Alysia Penna   Encounter Date: 10/18/2018  OT End of Session - 10/18/18 1703    Visit Number  6    Number of Visits  12    Date for OT Re-Evaluation  11/02/18    Authorization Type  Medicaid    Authorization Time Period  8 visits approved 2/25-3/23    Authorization - Visit Number  2    Authorization - Number of Visits  8    OT Start Time  8182    OT Stop Time  1732    OT Time Calculation (min)  42 min    Activity Tolerance  Patient tolerated treatment well    Behavior During Therapy  [facility] for tasks assessed/performed       Past Medical History:  Diagnosis Date  . Anxiety   . Arthritis   . Asthma   . Bowel obstruction ([place])   . Chronic pain   . COPD (chronic obstructive pulmonary disease) ([place])   . Depression   . GERD (gastroesophageal reflux disease)   . Hypertension     Past Surgical History:  Procedure Laterality Date  . ABDOMINAL SURGERY  2003   blockage  . left salpingectomy    . uterine ablation      There were no vitals filed for this visit.  Subjective Assessment - 10/18/18 1701    Subjective   S: No pain today.    Currently in Pain?  No/denies         [facility] OT Assessment - 10/18/18 1702      Assessment   Medical Diagnosis  right side weakness and frozen shoulder S/P CVA      Precautions   Precautions  None               OT Treatments/Exercises (OP) - 10/18/18 1702      Exercises   Exercises  Shoulder;Hand      Shoulder Exercises: Supine   Protraction  PROM;5 reps;AAROM;12 reps    Horizontal ABduction  PROM;5 reps;AAROM;12 reps    External Rotation  PROM;5 reps;AAROM;12 reps    Internal Rotation  PROM;5 reps;AAROM;12 reps    Flexion  PROM;5  reps;AAROM;12 reps    ABduction  PROM;5 reps;AAROM;12 reps      Shoulder Exercises: Seated   Protraction  AROM;10 reps    External Rotation  AROM;10 reps    Internal Rotation  AROM;10 reps    Flexion  AROM;10 reps    Abduction  AROM;10 reps      Hand Exercises   Sponges  13,14    Other Hand Exercises  Utilized green resistive clothespin with 3 point pinch to pick up and place sponges in container.       Fine Motor Coordination (Hand/Wrist)   Fine Motor Coordination  Manipulating coins    Manipulating coins  Patient focused on picking up 5 pennies while transferring from fingertip to palm then transfering from palm to fingertip to place in bank.              OT Education - 10/18/18 1732    Education Details  A/ROM shoulder exercises seated.     Person(s) Educated  Patient    Methods  Explanation;Demonstration;Verbal cues;Handout    Comprehension  Returned  demonstration;Verbalized understanding       OT Short Term Goals - 09/28/18 1644      OT SHORT TERM GOAL #1   Title  Patient will be educated and independent with HEP to increase her ability to use her RUE as her dominant extremity for all daily tasks.     Time  6    Period  Weeks    Status  On-going    Target Date  11/02/18      OT SHORT TERM GOAL #2   Title  Patient will increase RUE A/ROM to [facility] in order to achieve the desired ROM to comb and fix her hair.     Time  6    Period  Weeks    Status  On-going      OT SHORT TERM GOAL #3   Title  Patient will increase her right shoulder strength to 4+/5 in order to return to holding and carring lightweight household items.    Time  6    Period  Weeks    Status  On-going      OT SHORT TERM GOAL #4   Title  Patient will increase her right hand grip strength by 15# and pinch strength by 10# in order to hold onto and manipulate every day objects without dropping them.     Time  6    Period  Weeks    Status  On-going      OT SHORT TERM GOAL #5   Title  Patient  will increase her right hand coordination by completing the 9 hole peg test in 26 seconds or less in order to increase fluidity of motor movements required to completed daily tasks.     Time  6    Period  Weeks    Status  On-going      OT SHORT TERM GOAL #6   Title  Patient will report a decrease in right shoulder pain of approximately 3/10 when completing functional reaching tasks.     Time  6    Period  Weeks    Status  On-going      OT SHORT TERM GOAL #7   Title  Patient will decrease fascial restrictions to trace amount in the right shoulder in order to increase functional mobility needed for reaching tasks.     Time  6    Period  Weeks    Status  On-going               Plan - 10/18/18 1733    Clinical Impression Statement  A: Increased supine AA/ROM repetitions to 12 and progressed to A/ROM seated with HEP updated to include. Patient required VC for form and technique as needed. Patient inquired about how to get new velcro for her AFO as it no longer sticks well. Message sent to [facility] and Amy from PT staff.     Pt will benefit from skilled therapeutic intervention in order to improve on the following performance deficits  Body Structure / Function / Physical Skills    Body Structure / Function / Physical Skills  Coordination;ROM;Fascial restriction;Pain;Strength    Plan  P: Continue with handgripper task. Coordination task focusing on fluid and smooth movement patterns. Add proximal shoulder strengthening     Consulted and Agree with Plan of Care  Patient       Patient will benefit from skilled therapeutic intervention in order to improve the following deficits and impairments:     Visit Diagnosis: Other symptoms  and signs involving the musculoskeletal system  Other lack of coordination  Stiffness of right shoulder, not elsewhere classified  Acute pain of right shoulder    Problem List Patient Active Problem List   Diagnosis Date Noted  . Dental disease  07/02/2018  . Hemiparesis affecting right side as late effect of stroke ([place]) 06/17/2018  . Stroke (cerebrum) ([place]) 06/17/2018  . H/O medication noncompliance   . Chronic pain syndrome   . Leukocytosis   . Acute ischemic stroke ([place]) 06/13/2018  . Tobacco abuse 06/13/2018  . Lacunar stroke ([place]) 06/12/2018  . Hypertension   . COPD (chronic obstructive pulmonary disease) ([place])   . GERD (gastroesophageal reflux disease) 04/06/2012  . Abdominal pain 04/06/2012  . Nausea & vomiting 04/06/2012  . Melena 04/06/2012   Ailene Ravel, OTR/L,CBIS  [phone]  10/18/2018, 5:36 PM  [place] [address] [place], [place], [address] Phone: [phone]   Fax:  [phone]  Name: SNIGDHA HOWSER MRN: [id number] Date of Birth: [birth year]

## 2018-10-21 ENCOUNTER — Ambulatory Visit (HOSPITAL_COMMUNITY): Payer: Medicaid Other | Admitting: Occupational Therapy

## 2018-10-21 ENCOUNTER — Encounter (HOSPITAL_COMMUNITY): Payer: Self-pay | Admitting: Occupational Therapy

## 2018-10-21 DIAGNOSIS — M25611 Stiffness of right shoulder, not elsewhere classified: Secondary | ICD-10-CM

## 2018-10-21 DIAGNOSIS — R29898 Other symptoms and signs involving the musculoskeletal system: Secondary | ICD-10-CM

## 2018-10-21 DIAGNOSIS — R278 Other lack of coordination: Secondary | ICD-10-CM

## 2018-10-21 DIAGNOSIS — M25511 Pain in right shoulder: Secondary | ICD-10-CM

## 2018-10-21 NOTE — Therapy (Signed)
[place], [place], [address] Phone: [phone]   Fax:  [phone]  Occupational Therapy Treatment  Patient Details  Name: Judy Boyd MRN: [id number] Date of Birth: 31-Oct-1960 Referring Provider (OT): Dr. Alysia Penna   Encounter Date: 10/21/2018  OT End of Session - 10/21/18 0941    Visit Number  7    Number of Visits  12    Date for OT Re-Evaluation  11/02/18    Authorization Type  Medicaid    Authorization Time Period  8 visits approved 2/25-3/23    Authorization - Visit Number  3    Authorization - Number of Visits  8    OT Start Time  0906    OT Stop Time  0946    OT Time Calculation (min)  40 min    Activity Tolerance  Patient tolerated treatment well    Behavior During Therapy  [facility] for tasks assessed/performed       Past Medical History:  Diagnosis Date  . Anxiety   . Arthritis   . Asthma   . Bowel obstruction ([place])   . Chronic pain   . COPD (chronic obstructive pulmonary disease) ([place])   . Depression   . GERD (gastroesophageal reflux disease)   . Hypertension     Past Surgical History:  Procedure Laterality Date  . ABDOMINAL SURGERY  2003   blockage  . left salpingectomy    . uterine ablation      There were no vitals filed for this visit.  Subjective Assessment - 10/21/18 0908    Subjective   S: I think it might be as good as it's going to get.     Currently in Pain?  No/denies         [facility] OT Assessment - 10/21/18 0908      Assessment   Medical Diagnosis  right side weakness and frozen shoulder S/P CVA      Precautions   Precautions  None               OT Treatments/Exercises (OP) - 10/21/18 0909      Exercises   Exercises  Shoulder;Hand      Shoulder Exercises: Supine   Protraction  PROM;5 reps;AROM;10 reps    Horizontal ABduction  PROM;5 reps;AROM;10 reps    External Rotation  PROM;5 reps;AROM;10 reps    Internal Rotation  PROM;5  reps;AROM;10 reps    Flexion  PROM;5 reps;AROM;10 reps    ABduction  PROM;5 reps;AROM;10 reps      Shoulder Exercises: Seated   Protraction  AROM;10 reps    External Rotation  AROM;10 reps    Internal Rotation  AROM;10 reps    Flexion  AROM;10 reps    Abduction  AROM;10 reps      Shoulder Exercises: ROM/Strengthening   X to V Arms  10X    Proximal Shoulder Strengthening, Supine  10X each no rest breaks    Proximal Shoulder Strengthening, Seated  10X each no rest breaks      Hand Exercises   Hand Gripper with Large Beads  all beads gripper at 22#   horizontal   Hand Gripper with Medium Beads  all beads gripper at 22#   horizontal     Fine Motor Coordination (Hand/Wrist)   Fine Motor Coordination  Grooved pegs    Grooved pegs  Pt picking up one peg at a time and manipulating with fingertips to place in pegboard. Min difficulty  with one peg at a time. Then had pt hold multiple pegs working on palm to fingertip translation prior to placing. Mod difficulty and increased time required for task completion.                 OT Short Term Goals - 09/28/18 1644      OT SHORT TERM GOAL #1   Title  Patient will be educated and independent with HEP to increase her ability to use her RUE as her dominant extremity for all daily tasks.     Time  6    Period  Weeks    Status  On-going    Target Date  11/02/18      OT SHORT TERM GOAL #2   Title  Patient will increase RUE A/ROM to [facility] in order to achieve the desired ROM to comb and fix her hair.     Time  6    Period  Weeks    Status  On-going      OT SHORT TERM GOAL #3   Title  Patient will increase her right shoulder strength to 4+/5 in order to return to holding and carring lightweight household items.    Time  6    Period  Weeks    Status  On-going      OT SHORT TERM GOAL #4   Title  Patient will increase her right hand grip strength by 15# and pinch strength by 10# in order to hold onto and manipulate every day objects  without dropping them.     Time  6    Period  Weeks    Status  On-going      OT SHORT TERM GOAL #5   Title  Patient will increase her right hand coordination by completing the 9 hole peg test in 26 seconds or less in order to increase fluidity of motor movements required to completed daily tasks.     Time  6    Period  Weeks    Status  On-going      OT SHORT TERM GOAL #6   Title  Patient will report a decrease in right shoulder pain of approximately 3/10 when completing functional reaching tasks.     Time  6    Period  Weeks    Status  On-going      OT SHORT TERM GOAL #7   Title  Patient will decrease fascial restrictions to trace amount in the right shoulder in order to increase functional mobility needed for reaching tasks.     Time  6    Period  Weeks    Status  On-going               Plan - 10/21/18 4098    Clinical Impression Statement  A: Completed A/ROM exercises in supine and sitting today, verbal cuing for form and correct completion. Added proximal shoulder strengthening, mod difficulty keeping RUE straight towards end of task. Increased handgripper resistance, however pt holding gripper horizontally. Added grooved pegboard task working on fine motor coordination and in-hand manipulation. Verbal cuing for form and technique during tasks.     Body Structure / Function / Physical Skills  Coordination;ROM;Fascial restriction;Pain;Strength    Plan  P: Attempt scapular theraband, Continue working on coordination tasks       Patient will benefit from skilled therapeutic intervention in order to improve the following deficits and impairments:  Body Structure / Function / Physical Skills  Visit Diagnosis: Other symptoms and  signs involving the musculoskeletal system  Other lack of coordination  Stiffness of right shoulder, not elsewhere classified  Acute pain of right shoulder    Problem List Patient Active Problem List   Diagnosis Date Noted  . Dental  disease 07/02/2018  . Hemiparesis affecting right side as late effect of stroke ([place]) 06/17/2018  . Stroke (cerebrum) ([place]) 06/17/2018  . H/O medication noncompliance   . Chronic pain syndrome   . Leukocytosis   . Acute ischemic stroke ([place]) 06/13/2018  . Tobacco abuse 06/13/2018  . Lacunar stroke ([place]) 06/12/2018  . Hypertension   . COPD (chronic obstructive pulmonary disease) ([place])   . GERD (gastroesophageal reflux disease) 04/06/2012  . Abdominal pain 04/06/2012  . Nausea & vomiting 04/06/2012  . Melena 04/06/2012   Guadelupe Sabin, OTR/L  [phone] 10/21/2018, 9:50 AM  [place] [facility] [address] [place], [place], [address] Phone: [phone]   Fax:  [phone]  Name: Judy Boyd MRN: [id number] Date of Birth: 26-Aug-1960

## 2018-10-25 ENCOUNTER — Ambulatory Visit (HOSPITAL_COMMUNITY): Payer: Medicaid Other

## 2018-10-25 ENCOUNTER — Encounter (HOSPITAL_COMMUNITY): Payer: Self-pay

## 2018-10-25 DIAGNOSIS — R29898 Other symptoms and signs involving the musculoskeletal system: Secondary | ICD-10-CM

## 2018-10-25 DIAGNOSIS — R278 Other lack of coordination: Secondary | ICD-10-CM

## 2018-10-25 DIAGNOSIS — M25611 Stiffness of right shoulder, not elsewhere classified: Secondary | ICD-10-CM

## 2018-10-25 DIAGNOSIS — M25511 Pain in right shoulder: Secondary | ICD-10-CM

## 2018-10-25 NOTE — Therapy (Signed)
[place], [place], [address] Phone: [phone]   Fax:  [phone]  Occupational Therapy Treatment  Patient Details  Name: Judy Boyd MRN: [id number] Date of Birth: [birth year] Referring Provider (OT): Dr. Alysia Penna   Encounter Date: 10/25/2018  OT End of Session - 10/25/18 1721    Visit Number  8    Number of Visits  12    Date for OT Re-Evaluation  11/02/18    Authorization Type  Medicaid    Authorization Time Period  8 visits approved 2/25-3/23    Authorization - Visit Number  4    Authorization - Number of Visits  8    OT Start Time  1025    OT Stop Time  1732    OT Time Calculation (min)  39 min    Activity Tolerance  Patient tolerated treatment well    Behavior During Therapy  [facility] for tasks assessed/performed       Past Medical History:  Diagnosis Date  . Anxiety   . Arthritis   . Asthma   . Bowel obstruction ([place])   . Chronic pain   . COPD (chronic obstructive pulmonary disease) ([place])   . Depression   . GERD (gastroesophageal reflux disease)   . Hypertension     Past Surgical History:  Procedure Laterality Date  . ABDOMINAL SURGERY  2003   blockage  . left salpingectomy    . uterine ablation      There were no vitals filed for this visit.  Subjective Assessment - 10/25/18 1658    Subjective   S: I can tell it's gotten a whole lot better.     Currently in Pain?  No/denies         [facility] OT Assessment - 10/25/18 1659      Assessment   Medical Diagnosis  right side weakness and frozen shoulder S/P CVA      Precautions   Precautions  None               OT Treatments/Exercises (OP) - 10/25/18 1659      Exercises   Exercises  Shoulder;Hand      Shoulder Exercises: Supine   Protraction  PROM;5 reps;AROM;12 reps    Horizontal ABduction  PROM;5 reps;AROM;12 reps    External Rotation  PROM;5 reps;AROM;12 reps    Internal Rotation  PROM;5 reps;AROM;12 reps     Flexion  PROM;5 reps;AROM;12 reps    ABduction  PROM;5 reps;AROM;12 reps      Shoulder Exercises: Seated   Extension  Theraband;10 reps    Theraband Level (Shoulder Extension)  Level 2 (Red)    Retraction  Theraband;10 reps    Theraband Level (Shoulder Retraction)  Level 2 (Red)    Row  Theraband;10 reps    Theraband Level (Shoulder Row)  Level 2 (Red)    Protraction  AROM;12 reps    External Rotation  AROM;12 reps    Internal Rotation  AROM;12 reps    Flexion  AROM;12 reps    Abduction  AROM;12 reps      Shoulder Exercises: ROM/Strengthening   Proximal Shoulder Strengthening, Supine  10X each no rest breaks      Fine Motor Coordination (Hand/Wrist)   Fine Motor Coordination  Small Pegboard    Small Pegboard  Utilizing tweezers in right hand, patient completed colored pegboard pattern focusing on fine motor coordination requiring increased time.  OT Short Term Goals - 09/28/18 1644      OT SHORT TERM GOAL #1   Title  Patient will be educated and independent with HEP to increase her ability to use her RUE as her dominant extremity for all daily tasks.     Time  6    Period  Weeks    Status  On-going    Target Date  11/02/18      OT SHORT TERM GOAL #2   Title  Patient will increase RUE A/ROM to [facility] in order to achieve the desired ROM to comb and fix her hair.     Time  6    Period  Weeks    Status  On-going      OT SHORT TERM GOAL #3   Title  Patient will increase her right shoulder strength to 4+/5 in order to return to holding and carring lightweight household items.    Time  6    Period  Weeks    Status  On-going      OT SHORT TERM GOAL #4   Title  Patient will increase her right hand grip strength by 15# and pinch strength by 10# in order to hold onto and manipulate every day objects without dropping them.     Time  6    Period  Weeks    Status  On-going      OT SHORT TERM GOAL #5   Title  Patient will increase her right hand  coordination by completing the 9 hole peg test in 26 seconds or less in order to increase fluidity of motor movements required to completed daily tasks.     Time  6    Period  Weeks    Status  On-going      OT SHORT TERM GOAL #6   Title  Patient will report a decrease in right shoulder pain of approximately 3/10 when completing functional reaching tasks.     Time  6    Period  Weeks    Status  On-going      OT SHORT TERM GOAL #7   Title  Patient will decrease fascial restrictions to trace amount in the right shoulder in order to increase functional mobility needed for reaching tasks.     Time  6    Period  Weeks    Status  On-going               Plan - 10/25/18 1744    Clinical Impression Statement  A: Pt required increased time to complete coordination task with tweezers due to lack of coordination. VC for technique required initiately.     Body Structure / Function / Physical Skills  Coordination;ROM;Fascial restriction;Pain;Strength    Plan  P: Continue with colored pegboard with tweezers.     Consulted and Agree with Plan of Care  Patient       Patient will benefit from skilled therapeutic intervention in order to improve the following deficits and impairments:  Body Structure / Function / Physical Skills  Visit Diagnosis: Other symptoms and signs involving the musculoskeletal system  Other lack of coordination  Stiffness of right shoulder, not elsewhere classified  Acute pain of right shoulder    Problem List Patient Active Problem List   Diagnosis Date Noted  . Dental disease 07/02/2018  . Hemiparesis affecting right side as late effect of stroke ([place]) 06/17/2018  . Stroke (cerebrum) ([place]) 06/17/2018  . H/O medication noncompliance   . Chronic  pain syndrome   . Leukocytosis   . Acute ischemic stroke ([place]) 06/13/2018  . Tobacco abuse 06/13/2018  . Lacunar stroke ([place]) 06/12/2018  . Hypertension   . COPD (chronic obstructive pulmonary disease) ([place])    . GERD (gastroesophageal reflux disease) 04/06/2012  . Abdominal pain 04/06/2012  . Nausea & vomiting 04/06/2012  . Melena 04/06/2012   Ailene Ravel, OTR/L,CBIS  [phone]  10/25/2018, 5:46 PM  [place] [address] [place], [place], [address] Phone: [phone]   Fax:  [phone]  Name: JACKQULINE BRANCA MRN: [id number] Date of Birth: August 22, 1960

## 2018-10-28 ENCOUNTER — Other Ambulatory Visit: Payer: Self-pay

## 2018-10-28 ENCOUNTER — Other Ambulatory Visit: Payer: Self-pay | Admitting: Neurosurgery

## 2018-10-28 ENCOUNTER — Ambulatory Visit (HOSPITAL_COMMUNITY): Payer: Medicaid Other

## 2018-10-28 ENCOUNTER — Encounter (HOSPITAL_COMMUNITY): Payer: Self-pay

## 2018-10-28 DIAGNOSIS — R29898 Other symptoms and signs involving the musculoskeletal system: Secondary | ICD-10-CM | POA: Diagnosis not present

## 2018-10-28 DIAGNOSIS — M25511 Pain in right shoulder: Secondary | ICD-10-CM

## 2018-10-28 DIAGNOSIS — M25611 Stiffness of right shoulder, not elsewhere classified: Secondary | ICD-10-CM

## 2018-10-28 DIAGNOSIS — R278 Other lack of coordination: Secondary | ICD-10-CM

## 2018-10-28 NOTE — Therapy (Signed)
[place], [place], [address] Phone: [phone]   Fax:  [phone]  Occupational Therapy Treatment  Patient Details  Name: Judy Boyd MRN: [id number] Date of Birth: Apr 08, 1961 Referring Provider (OT): Dr. Alysia Penna   Encounter Date: 10/28/2018  OT End of Session - 10/28/18 1218    Visit Number  9    Number of Visits  12    Date for OT Re-Evaluation  11/02/18    Authorization Type  Medicaid    Authorization Time Period  8 visits approved 2/25-3/23    Authorization - Visit Number  5    Authorization - Number of Visits  8    OT Start Time  1030    OT Stop Time  1115    OT Time Calculation (min)  45 min    Activity Tolerance  Patient tolerated treatment well    Behavior During Therapy  [facility] for tasks assessed/performed       Past Medical History:  Diagnosis Date  . Anxiety   . Arthritis   . Asthma   . Bowel obstruction ([place])   . Chronic pain   . COPD (chronic obstructive pulmonary disease) ([place])   . Depression   . GERD (gastroesophageal reflux disease)   . Hypertension     Past Surgical History:  Procedure Laterality Date  . ABDOMINAL SURGERY  2003   blockage  . left salpingectomy    . uterine ablation      There were no vitals filed for this visit.  Subjective Assessment - 10/28/18 1216    Subjective   S: I'm so sick of therapy. I've been doing it since November. It's just making me tired.     Currently in Pain?  No/denies         [facility] OT Assessment - 10/28/18 1217      Assessment   Medical Diagnosis  right side weakness and frozen shoulder S/P CVA      Precautions   Precautions  None               OT Treatments/Exercises (OP) - 10/28/18 1050      Exercises   Exercises  Shoulder;Hand      Shoulder Exercises: Supine   Protraction  PROM;5 reps;AROM;12 reps    Horizontal ABduction  PROM;5 reps;AROM;12 reps    External Rotation  PROM;5 reps;AROM;12 reps    Internal Rotation  PROM;5 reps;AROM;12 reps    Flexion  PROM;5 reps;AROM;12 reps    ABduction  PROM;5 reps;AROM;12 reps      Fine Motor Coordination (Hand/Wrist)   Fine Motor Coordination  Flipping cards;Dealing card with thumb;Grooved pegs    Flipping cards  Pt flipped cards from deck one at a time to focus on pinch and coordination of hand.     Dealing card with thumb  Pt able to deal cards with thumb with min difficulty    Grooved pegs  pt picked up 4 pegs one at a time and placed in pegboard. All pegs removed from pegboard with use of tweezers. Task required increased time due to decreased coordination.                OT Short Term Goals - 09/28/18 1644      OT SHORT TERM GOAL #1   Title  Patient will be educated and independent with HEP to increase her ability to use her RUE as her dominant extremity for all daily tasks.  Time  6    Period  Weeks    Status  On-going    Target Date  11/02/18      OT SHORT TERM GOAL #2   Title  Patient will increase RUE A/ROM to [facility] in order to achieve the desired ROM to comb and fix her hair.     Time  6    Period  Weeks    Status  On-going      OT SHORT TERM GOAL #3   Title  Patient will increase her right shoulder strength to 4+/5 in order to return to holding and carring lightweight household items.    Time  6    Period  Weeks    Status  On-going      OT SHORT TERM GOAL #4   Title  Patient will increase her right hand grip strength by 15# and pinch strength by 10# in order to hold onto and manipulate every day objects without dropping them.     Time  6    Period  Weeks    Status  On-going      OT SHORT TERM GOAL #5   Title  Patient will increase her right hand coordination by completing the 9 hole peg test in 26 seconds or less in order to increase fluidity of motor movements required to completed daily tasks.     Time  6    Period  Weeks    Status  On-going      OT SHORT TERM GOAL #6   Title  Patient will report a  decrease in right shoulder pain of approximately 3/10 when completing functional reaching tasks.     Time  6    Period  Weeks    Status  On-going      OT SHORT TERM GOAL #7   Title  Patient will decrease fascial restrictions to trace amount in the right shoulder in order to increase functional mobility needed for reaching tasks.     Time  6    Period  Weeks    Status  On-going               Plan - 10/28/18 1218    Clinical Impression Statement  A: Patient voices concern with fatigue with length of therapy duration since November. She is interested in discontinuing at next reassessment. Pt is interested to see what goals she has made and what she needs to continue to work on. Session focused on coordination as she reports this is the area she notices the most difficulty. VC for form and technique were needed during pegboard activity. Pt required increased time due to decreased coordinaton.     Body Structure / Function / Physical Skills  Coordination;ROM;Fascial restriction;Pain;Strength    Plan  P: reassessment and possible discharge per patient's request. Update HEP.     Consulted and Agree with Plan of Care  Patient       Patient will benefit from skilled therapeutic intervention in order to improve the following deficits and impairments:  Body Structure / Function / Physical Skills  Visit Diagnosis: Other symptoms and signs involving the musculoskeletal system  Other lack of coordination  Acute pain of right shoulder  Stiffness of right shoulder, not elsewhere classified    Problem List Patient Active Problem List   Diagnosis Date Noted  . Dental disease 07/02/2018  . Hemiparesis affecting right side as late effect of stroke ([place]) 06/17/2018  . Stroke (cerebrum) ([place]) 06/17/2018  .  H/O medication noncompliance   . Chronic pain syndrome   . Leukocytosis   . Acute ischemic stroke ([place]) 06/13/2018  . Tobacco abuse 06/13/2018  . Lacunar stroke ([place]) 06/12/2018  .  Hypertension   . COPD (chronic obstructive pulmonary disease) ([place])   . GERD (gastroesophageal reflux disease) 04/06/2012  . Abdominal pain 04/06/2012  . Nausea & vomiting 04/06/2012  . Melena 04/06/2012   Ailene Ravel, OTR/L,CBIS  [phone]  10/28/2018, 12:21 PM  [facility] [place] [address] [place], [place], [address] Phone: [phone]   Fax:  [phone]  Name: PATSYE SULLIVANT MRN: [id number] Date of Birth: 10-09-1960

## 2018-11-01 ENCOUNTER — Ambulatory Visit (HOSPITAL_COMMUNITY): Payer: Medicaid Other

## 2018-11-02 ENCOUNTER — Ambulatory Visit (HOSPITAL_COMMUNITY): Payer: Medicaid Other

## 2018-11-02 ENCOUNTER — Encounter (HOSPITAL_COMMUNITY): Payer: Self-pay

## 2018-11-02 ENCOUNTER — Other Ambulatory Visit: Payer: Self-pay

## 2018-11-02 DIAGNOSIS — R29898 Other symptoms and signs involving the musculoskeletal system: Secondary | ICD-10-CM

## 2018-11-02 DIAGNOSIS — M25611 Stiffness of right shoulder, not elsewhere classified: Secondary | ICD-10-CM

## 2018-11-02 DIAGNOSIS — R278 Other lack of coordination: Secondary | ICD-10-CM

## 2018-11-02 DIAGNOSIS — M25511 Pain in right shoulder: Secondary | ICD-10-CM

## 2018-11-02 NOTE — Therapy (Signed)
[place], [place], [address] Phone: [phone]   Fax:  [phone]  Occupational Therapy Treatment  Patient Details  Name: Judy Boyd MRN: [id number] Date of Birth: 1961/03/01 Referring Provider (OT): Dr. Alysia Penna   Encounter Date: 11/02/2018  OT End of Session - 11/02/18 1406    Visit Number  10    Number of Visits  12    Date for OT Re-Evaluation  --    Authorization Type  Medicaid    Authorization Time Period  8 visits approved 2/25-3/23    Authorization - Visit Number  6    Authorization - Number of Visits  8    OT Start Time  3557    OT Stop Time  1430    OT Time Calculation (min)  43 min    Activity Tolerance  Patient tolerated treatment well    Behavior During Therapy  [facility] for tasks assessed/performed       Past Medical History:  Diagnosis Date  . Anxiety   . Arthritis   . Asthma   . Bowel obstruction ([place])   . Chronic pain   . COPD (chronic obstructive pulmonary disease) ([place])   . Depression   . GERD (gastroesophageal reflux disease)   . Hypertension     Past Surgical History:  Procedure Laterality Date  . ABDOMINAL SURGERY  2003   blockage  . left salpingectomy    . uterine ablation      There were no vitals filed for this visit.  Subjective Assessment - 11/02/18 1537    Subjective   S: I can tell a big improvement with my arm.    Currently in Pain?  No/denies         [facility] OT Assessment - 11/02/18 1353      Assessment   Medical Diagnosis  right side weakness and frozen shoulder S/P CVA    Referring Provider (OT)  Dr. Alysia Penna    Onset Date/Surgical Date  06/14/18      Precautions   Precautions  None      Prior Function   Level of Independence  Independent with household mobility with device;Needs assistance with homemaking      Coordination   Right 9 Hole Peg Test  31.3"   previous; 37.6"     ROM / Strength   AROM / PROM / Strength   AROM;Strength;PROM      Palpation   Palpation comment  trace fascial restrictions noted in right upper arm region.      AROM   Overall AROM Comments  Assessed seated. IR/er adducted.     AROM Assessment Site  Shoulder    Right/Left Shoulder  Right    Right Shoulder Flexion  140 Degrees   previous: 112   Right Shoulder ABduction  155 Degrees   previous: 112   Right Shoulder Internal Rotation  90 Degrees   previous: 90   Right Shoulder External Rotation  60 Degrees   previous: 55     PROM   Overall PROM Comments  Assessed supine. IR/er adducted    PROM Assessment Site  Shoulder    Right/Left Shoulder  Right    Right Shoulder Flexion  145 Degrees   previous: 122   Right Shoulder ABduction  180 Degrees   previous: 110   Right Shoulder Internal Rotation  90 Degrees   previous: 90   Right Shoulder External Rotation  65 Degrees   previous:  41     Strength   Overall Strength Comments  Assessed seated. IR/er adducted    Strength Assessment Site  Shoulder    Right/Left Shoulder  Right    Right Shoulder Flexion  4/5   previous: 4/5   Right Shoulder ABduction  4+/5   previous: 4/5   Right Shoulder Internal Rotation  5/5   previous; 4/5   Right Shoulder External Rotation  4/5   previous: 3+/5   Right Hand Grip (lbs)  40   previous: 20   Right Hand Lateral Pinch  17 lbs   previous: 10   Right Hand 3 Point Pinch  16 lbs   previous: 7                      OT Education - 11/02/18 1537    Education Details  Reviewed goals and progress in therapy. Updated HEP to include: A/ROM shoulder exercises, coordination activities, grip and pinch with red theraputty.     Person(s) Educated  Patient    Methods  Explanation;Handout;Demonstration    Comprehension  Verbalized understanding       OT Short Term Goals - 11/02/18 1407      OT SHORT TERM GOAL #1   Title  Patient will be educated and independent with HEP to increase her ability to use her RUE as her dominant  extremity for all daily tasks.     Baseline  Patient reports that she is utilizing her RUE as her dominant for 80% of daily tasks.     Time  6    Period  Weeks    Status  Achieved    Target Date  11/02/18      OT SHORT TERM GOAL #2   Title  Patient will increase RUE A/ROM to [facility] in order to achieve the desired ROM to comb and fix her hair.     Time  6    Period  Weeks    Status  Achieved      OT SHORT TERM GOAL #3   Title  Patient will increase her right shoulder strength to 4+/5 in order to return to holding and carring lightweight household items.    Time  6    Period  Weeks    Status  Achieved      OT SHORT TERM GOAL #4   Title  Patient will increase her right hand grip strength by 15# and pinch strength by 10# in order to hold onto and manipulate every day objects without dropping them.     Time  6    Period  Weeks    Status  Achieved      OT SHORT TERM GOAL #5   Title  Patient will increase her right hand coordination by completing the 9 hole peg test in 26 seconds or less in order to increase fluidity of motor movements required to completed daily tasks.     Time  6    Period  Weeks    Status  Partially Met      OT SHORT TERM GOAL #6   Title  Patient will report a decrease in right shoulder pain of approximately 3/10 when completing functional reaching tasks.     Time  6    Period  Weeks    Status  Achieved      OT SHORT TERM GOAL #7   Title  Patient will decrease fascial restrictions to trace amount in the right shoulder in  order to increase functional mobility needed for reaching tasks.     Time  6    Period  Weeks    Status  Achieved               Plan - 11/02/18 1541    Clinical Impression Statement  A: Reassessment completed this date. patient has met all but one goal which she partially met. She reports much improvement with use and movement of her right UE since she has started therapy. She continues to have weakness, decreased coordination and  strength in her RUE although feels confident to continue working independently at home with her HEP. HEP was updated and reviewed. pt with no further questions.     Body Structure / Function / Physical Skills  Coordination;ROM;Fascial restriction;Pain;Strength    Plan  P: discharge from OT services with HEP.     Consulted and Agree with Plan of Care  Patient       Patient will benefit from skilled therapeutic intervention in order to improve the following deficits and impairments:  Body Structure / Function / Physical Skills  Visit Diagnosis: Other symptoms and signs involving the musculoskeletal system  Other lack of coordination  Acute pain of right shoulder  Stiffness of right shoulder, not elsewhere classified    Problem List Patient Active Problem List   Diagnosis Date Noted  . Dental disease 07/02/2018  . Hemiparesis affecting right side as late effect of stroke ([place]) 06/17/2018  . Stroke (cerebrum) ([place]) 06/17/2018  . H/O medication noncompliance   . Chronic pain syndrome   . Leukocytosis   . Acute ischemic stroke ([place]) 06/13/2018  . Tobacco abuse 06/13/2018  . Lacunar stroke ([place]) 06/12/2018  . Hypertension   . COPD (chronic obstructive pulmonary disease) ([place])   . GERD (gastroesophageal reflux disease) 04/06/2012  . Abdominal pain 04/06/2012  . Nausea & vomiting 04/06/2012  . Melena 04/06/2012  OCCUPATIONAL THERAPY DISCHARGE SUMMARY  Visits from Start of Care: 10  Current functional level related to goals / functional outcomes: See above   Remaining deficits: See above   Education / Equipment: See above Plan: Patient agrees to discharge.  Patient goals were met. Patient is being discharged due to meeting the stated rehab goals.  ?????        Ailene Ravel, OTR/L,CBIS  [phone]  11/02/2018, 3:46 PM  [place] [address] [place], [place], [address] Phone: [phone]   Fax:   [phone]  Name: Judy Boyd MRN: [id number] Date of Birth: 1961/01/05

## 2018-11-02 NOTE — Patient Instructions (Signed)
Home Exercises Program Theraputty Exercises  Do the following exercises 2-3 times a day using your affected hand. (use red putty) 1. Roll putty into a ball.  2. Make into a pancake.  3. Roll putty into a roll.  4. Pinch along log with first finger and thumb.   5. Make into a ball.  6. Roll it back into a log.   7. Pinch using thumb and side of first finger.  8. Roll into a ball, then flatten into a pancake.  9. Using your fingers, make putty into a mountain.  10. Roll putty into a ball. Squeeze and release 10 times.    Coordination Activities  Perform the following activities for 15 minutes 2-3 times per day with right hand(s).   Flip cards 1 at a time as fast as you can.  Deal cards with your thumb (Hold deck in hand and push card off top with thumb).  Rotate card in hand (clockwise and counter-clockwise).  Shuffle cards.  Pick up coins and place in container or coin bank.  Pick up coins and stack.  Pick up coins one at a time until you get 5-10 in your hand, then move coins from palm to fingertips to stack one at a time.  Twirl pen between fingers.  Practice writing and/or typing.    Repeat all exercises 10-15 times, 1-2 times per day.  1) Shoulder Protraction    Begin with elbows by your side, slowly "punch" straight out in front of you.      2) Shoulder Flexion  Standing:         Begin with arms at your side with thumbs pointed up, slowly raise both arms up and forward towards overhead.        3) Horizontal abduction/adduction   Standing:           Begin with arms straight out in front of you, bring out to the side in at "T" shape. Keep arms straight entire time.         4) Internal & External Rotation    *No band* -Stand with elbows at the side and elbows bent 90 degrees. Move your forearms away from your body, then bring back inward toward the body.     5) Shoulder Abduction  Standing:       begin  with your arms flat on the table next to your side. Slowly move your arms out to the side so that they go overhead, in a jumping jack or snow angel movement.    ` Both arms straight out in front of you, keep your elbows straight do not let them bend, the criss/cross your arms in front of you. 10 times  Both arms straight out in front of you, keep elbows straight.  Bend your wrist back and make small circles in the air.  Go in one direction the go in the other (Like you are waxing something) 10 times  Both arms straight out in front of you, keep elbows straight.  Bend your wrists back and move your arm up while the left arm goes down as quick as you can.10 times  With your right/left arm straight out in front of you, keep your elbow straight.  Write your name in the air 3-4 times quickly.  Put both of your hands on your lap with your palms down, turn your palms up and down as fast as you can. 10 times

## 2018-11-04 ENCOUNTER — Ambulatory Visit (HOSPITAL_COMMUNITY): Payer: Medicaid Other

## 2018-11-05 ENCOUNTER — Ambulatory Visit (HOSPITAL_COMMUNITY): Admission: RE | Admit: 2018-11-05 | Payer: Medicaid Other | Source: Ambulatory Visit

## 2018-11-08 ENCOUNTER — Ambulatory Visit (HOSPITAL_COMMUNITY): Payer: Medicaid Other

## 2018-11-11 ENCOUNTER — Ambulatory Visit (HOSPITAL_COMMUNITY): Admission: RE | Admit: 2018-11-11 | Payer: Medicaid Other | Source: Ambulatory Visit

## 2018-11-11 ENCOUNTER — Encounter (HOSPITAL_COMMUNITY): Payer: Self-pay

## 2018-11-15 ENCOUNTER — Encounter (HOSPITAL_COMMUNITY): Payer: Medicaid Other

## 2018-11-16 ENCOUNTER — Other Ambulatory Visit: Payer: Self-pay

## 2018-11-16 ENCOUNTER — Ambulatory Visit (INDEPENDENT_AMBULATORY_CARE_PROVIDER_SITE_OTHER): Payer: Medicaid Other | Admitting: Adult Health

## 2018-11-18 ENCOUNTER — Other Ambulatory Visit: Payer: Self-pay

## 2018-11-18 ENCOUNTER — Ambulatory Visit (INDEPENDENT_AMBULATORY_CARE_PROVIDER_SITE_OTHER): Payer: Medicaid Other | Admitting: Adult Health

## 2018-11-18 ENCOUNTER — Encounter: Payer: Self-pay | Admitting: Adult Health

## 2018-11-18 DIAGNOSIS — I10 Essential (primary) hypertension: Secondary | ICD-10-CM | POA: Diagnosis not present

## 2018-11-18 DIAGNOSIS — I63512 Cerebral infarction due to unspecified occlusion or stenosis of left middle cerebral artery: Secondary | ICD-10-CM

## 2018-11-18 DIAGNOSIS — E785 Hyperlipidemia, unspecified: Secondary | ICD-10-CM | POA: Diagnosis not present

## 2018-11-18 NOTE — Progress Notes (Signed)
[facility] [address] [place]. [place] [address] [phone]       VIRTUAL VISIT FOLLOW UP NOTE  Ms. Carolynn Comment Date of Birth:  12-Mar-1961 Medical Record Number:  [id number]   Reason for Referral:  hospital stroke follow up  Virtual Visit via Telephone Note  I connected with Carolynn Comment on 11/18/18 at 10:45 AM EDT by telephone  with provider located remotely in her own home and verified that I am speaking with the correct person using two identifiers who is located within her own home.   I discussed the limitations, risks, security and privacy concerns of performing an evaluation and management service by telephone and the availability of in person appointments. I also discussed with the patient that there may be a patient responsible charge related to this service. The patient expressed understanding and agreed to proceed.    CHIEF COMPLAINT:  Chief Complaint  Patient presents with   Follow-up    stroke follow up    HPI:  Ms. Wahlert is a 58 year old female who initially had 73-month follow-up office visit scheduled today but due to [place] pandemic, we are currently limiting in office visits therefore transitioned visit to telemedicine via telephone. She was initially scheduled on 11/16/2018 for video visit via WebEx but she unfortunately had difficulties using therefore rescheduled for telephone visit today.  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  She has been stable from a stroke standpoint without new or worsening TIA/stroke symptoms but continues to have residual deficits of mild left-sided weakness, balance difficulties and intermittent dysarthria.  She does state overall improvement since prior visit and is able to ambulate without assistive device within her home but will use cane for longer distance "just in case".  She denies any recent falls.   She states her speech worsens when she becomes upset but otherwise no residual speech difficulties.  She has completed all therapies but continues to do exercises at home.  She continues on aspirin without side effects of bleeding or bruising along with atorvastatin without side effects myalgias.  She does monitor BP at home and typically ranges 130s/90s with recent adjustment of her hydrochlorothiazide.  She did have scheduled follow-up visit with neurosurgery regarding aneurysms but was rescheduled for 12/22/2018 due to pandemic.  No further concerns at this time.  Denies new or worsening stroke/TIA symptoms.   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 History obtained from patient, daughter and chart review. Reviewed all radiology images and labs personally.  Ms. MAEKAYLA GIORGIO is a 58 y.o. female with history of hypertension, anxiety, depression, COPD, GERD who presented with R facial weakness, dysarthria and R hemiparesis who was admitted to [facility] 06/12/18 with 2 day hx of symptoms, non complaint with meds PTA. Transferred to [facility] 10/29 with worsened hemiparesis.  CT head obtained at [facility] showed old caudate infarcts.  CTA neck obtained to [facility] showed right mandible odontogenic infection with extensive dental disease and submandibular lymphadenopathy, right thyroid nodules, aortic arthrosclerosis and emphysema.  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  CT head obtained at [facility] showed left pontine infarct.  MRI brain obtained at [facility] showed left thalamic infarct and involve left pontine infarct.  2D echo showed an  EF of 60 to 65% without cardiac source of embolus identified.  LDL 143 and A1c 4.6.  Patient was noncompliant with aspirin 81 mg PTA and recommended DAPT for 3 weeks and aspirin alone.  Hypertensive urgency upon arrival with SBP 1 98-2 09 most likely due to noncompliance with medications.  BP stabilized throughout admission and recommended long-term BP goal normotensive range.  Initiated atorvastatin 40 mg for HLD management.  Current tobacco use with smoking cessation counseling provided.  Patient was discharged to [facility] for continued therapies in stable condition.  Patient is being seen today for hospital follow-up and is accompanied by her daughter.  She was discharged home with recommendations of home health therapies through advanced home care.  She is currently receiving outpatient PT. she continues to have right hemiparesis arm > leg with improvement, balance difficulties and mild dysarthria.  Her daughter was previously staying with her after hospital discharge but patient is currently living independently and is able to maintain all ADLs independently.  Daughter does state that she checks on her daily.  Daughter is concerned as she has not been receiving occupational therapy but does state that they have reached out to physical medicine for this order.  She has completed 3 weeks DAPT and continues on aspirin alone without side effects of bleeding or bruising.  Continues on atorvastatin without side effects myalgias.  Blood pressure today 159/93 and typically 130-140/80s at home.  She does endorse chronic pain which was previously treated by orthopedics and was on narcotics for but this has been discontinued and states that after her stroke, her prior orthopedic would not "touch her" and she needs to be seen by pain medicine.  She has completely quit smoking since hospital discharge.  She does have appointment scheduled with neurosurgery in 10/2018 for further evaluation of previously found aneurysms.  No  further concerns at this time.  Denies new or worsening stroke/TIA symptoms    PMH:  Past Medical History:  Diagnosis Date   Anxiety    Arthritis    Asthma    Bowel obstruction (HCC)    Chronic pain    COPD (chronic obstructive pulmonary disease) (HCC)    Depression    GERD (gastroesophageal reflux disease)    Hypertension    Stroke (HCC)     PSH:  Past Surgical History:  Procedure Laterality Date   ABDOMINAL SURGERY  2003   blockage   left salpingectomy     uterine ablation      Social History:  Social History   Socioeconomic History   Marital status: Single    Spouse name: Not on file   Number of children: 2   Years of education: Not on file   Highest education level: Not on file  Occupational History   Occupation: unemployed  Social Designer, fashion/clothing strain: Not on file   Food insecurity:    Worry: Not on file    Inability: Not on file   Transportation needs:    Medical: Not on file    Non-medical: Not on file  Tobacco Use   Smoking status: Current Every Day Smoker    Packs/day: 1.00    Years: 10.00    Pack years: 10.00    Types: Cigarettes   Smokeless tobacco: Never Used  Substance and Sexual Activity   Alcohol use: Yes    Comment: 2 beers per week   Drug use: No   Sexual activity: Yes    Birth control/protection: None  Lifestyle   Physical activity:    Days per week: Not on file    Minutes per session: Not on file   Stress: Not on file  Relationships   Social connections:    Talks on phone: Not on file    Gets together: Not on file    Attends religious service: Not on file    Active member of club or organization: Not on file    Attends meetings of clubs or organizations: Not on file    Relationship status: Not on file   Intimate partner violence:    Fear of current or ex partner: Not on file    Emotionally abused: Not on file    Physically abused: Not on file    Forced sexual activity: Not on  file  Other Topics Concern   Not on file  Social History Narrative   Not on file    Family History:  Family History  Problem Relation Age of Onset   Heart disease Mother    Heart disease Father     Medications:   Current Outpatient Medications on File Prior to Visit  Medication Sig Dispense Refill   acetaminophen (TYLENOL) 325 MG tablet Take 1-2 tablets (325-650 mg total) by mouth every 4 (four) hours as needed for mild pain.     albuterol (PROVENTIL HFA;VENTOLIN HFA) 108 (90 BASE) MCG/ACT inhaler Inhale 2 puffs into the lungs every 6 (six) hours as needed. Shortness of breath     aspirin EC 81 MG tablet Take 81 mg by mouth daily.     atorvastatin (LIPITOR) 80 MG tablet Take 80 mg by mouth daily.     budesonide-formoterol (SYMBICORT) 160-4.5 MCG/ACT inhaler Inhale 2 puffs into the lungs 2 (two) times daily.      busPIRone (BUSPAR) 15 MG tablet Take 15 mg by mouth 2 (two) times daily.     gabapentin (NEURONTIN) 300 MG capsule Take 300 mg by mouth 3 (three) times daily.     hydrochlorothiazide (HYDRODIURIL) 25 MG tablet Take 25 mg by mouth daily.     No current facility-administered medications on file prior to visit.     Allergies:   Allergies  Allergen Reactions   Amlodipine Other (See Comments)    Dehydration      Physical Exam  *Limited exam due to visit type*  General: Pleasant frail African-American female answering and asking questions appropriately throughout conversation   Neurologic Exam Mental Status: Awake and fully alert.  Unable to appreciate any speech difficulties.  Oriented to place and time. Recent and remote memory intact. Attention span, concentration and fund of knowledge appropriate. Mood and affect appropriate.        Diagnostic Data (Labs, Imaging, Testing)  CT HEAD WO CONTRAST 06/12/2018 -[facility] IMPRESSION: 1. Probable remote lacunar type infarcts in the left caudate area. 2. No CT findings for acute hemispheric  infarction or intracranial hemorrhage.  CT ANGIO NECK W OR WO CONTRAST 06/13/2018 -[facility] IMPRESSION: 1. Patent carotid and vertebral arteries. No dissection, occlusion, aneurysm, or hemodynamically significant stenosis by NASCET criteria. 2. Soft tissue inflammation over the right body of mandible, probable odontogenic infection, no discrete soft tissue abscess. 3. Extensive dental disease with multiple periapical cysts and carries including the right posterior mandibular molars subjacent to soft tissue inflammation. 4.  Right submandibular lymphadenopathy, likely reactive. 5. Thyroid nodules measuring up to 19 mm in the right lobe. Recommend further evaluation with thyroid ultrasound on a nonemergent basis. 6. Aortic Atherosclerosis (ICD10-I70.0) and Emphysema (ICD10-J43.9).  MR BRAIN WO CONTRAST MR MRA HEAD  06/14/2018 -[place] IMPRESSION: 1. 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 2. 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 Unfortunately, the right Pcomm which is a fetal origin of the Right PCA origin to arise directly from the aneurysm. Neuro-Interventional Radiology consultation is suggested to evaluate the appropriateness of potential treatment. Non-emergent evaluation can be arranged by calling [phone] during usual hours. Emergency evaluation can be requested by paging [phone]. 3. Subacute lacunar infarct of the left thalamus, with underlying moderately advanced bilateral deep gray matter small vessel ischemia. 4. Intracranial MRA is negative for intracranial stenosis or branch Occlusion.  CT HEAD WO CONTRAST 06/15/2018 - [facility] IMPRESSION: Left pontine low density is noted consistent with acute infarction described on prior MRI. No other significant changes noted compared to prior exam.  MR BRAIN  WO CONTRAST 06/16/2018 - [facility] IMPRESSION: 1. Progressed acute lacunar infarct in the left thalamus since 06/14/2018, which was only faint on that recent MRI and suspected to be subacute rather than acute at that time. 2. The Left Pontine infarct demonstrates expected evolution. 3. No associated hemorrhage or mass effect.  ECHOCARDIOGRAM 06/13/2018 Study Conclusions  - Left ventricle: The cavity size was normal. Wall thickness was   increased in a pattern of moderate LVH. Systolic function was   normal. The estimated ejection fraction was in the range of 60%   to 65%. Wall motion was normal; there were no regional wall   motion abnormalities. Doppler parameters are consistent with   abnormal left ventricular relaxation (grade 1 diastolic   dysfunction). - Aortic valve: Valve area (VTI): 2 cm^2. Valve area (Vmax): 2.21   cm^2. Valve area (Vmean): 2.11 cm^2. - Left atrium: The atrium was mildly dilated. - Technically adequate study.    ASSESSMENT: TRINITI GRUETZMACHER is a 58 y.o. year old female here with left pontine and left thalamic infarcts on 06/12/2018 secondary to small vessel disease. Vascular risk factors include HTN, HLD, COPD and medication noncompliance.  She has been doing well since prior visit with residual deficit of mild RLE weakness, balance difficulties and intermittent dysarthria.    PLAN:  1. Left pontine and left thalamic infarcts: Continue aspirin 81 mg daily  and atorvastatin 40 mg daily for secondary stroke prevention. Maintain strict control of hypertension with blood pressure goal below 130/90, diabetes with hemoglobin A1c goal below 6.5% and cholesterol with LDL cholesterol (bad cholesterol) goal below 70 mg/dL.  I also advised the patient to eat a healthy diet with plenty of whole grains, cereals, fruits and vegetables, exercise regularly with at least 30 minutes of continuous activity daily and maintain ideal body weight.  Highly encouraged continuation of  home exercises 2. HTN: Advised to continue current treatment regimen.  Advised to continue to monitor at home along with continued follow-up with PCP for management 3. HLD: Advised to continue current treatment regimen along with continued follow-up with PCP for future prescribing and monitoring of lipid panel 4. Aneurysms: Neurosurgery appointment rescheduled on 12/22/2018 for further evaluation and management     Follow-up in 6 months or call earlier if needed   I discussed the assessment and treatment plan with  the patient. The patient was provided an opportunity to ask questions and all were answered. The patient agreed with the plan and demonstrated an understanding of the instructions.   The patient was advised to call back or seek an in-person evaluation if the symptoms worsen or if the condition fails to improve as anticipated.  I provided 25 minutes of non-face-to-face time during this encounter.    Venancio Poisson, AGNP-BC  [facility] [address] [place], [place] [address]  Phone [phone] Fax [phone] Note: This document was prepared with digital dictation and possible smart phrase technology. Any transcriptional errors that result from this process are unintentional.

## 2018-11-18 NOTE — Progress Notes (Signed)
I agree with the above plan 

## 2018-12-10 ENCOUNTER — Other Ambulatory Visit (HOSPITAL_COMMUNITY)
Admission: RE | Admit: 2018-12-10 | Discharge: 2018-12-10 | Disposition: A | Discharge status: Home / Self Care | Payer: Medicaid Other | Source: Ambulatory Visit | Attending: Family Medicine | Admitting: Family Medicine

## 2018-12-10 DIAGNOSIS — Z0000 Encounter for general adult medical examination without abnormal findings: Secondary | ICD-10-CM | POA: Diagnosis present

## 2018-12-10 DIAGNOSIS — I10 Essential (primary) hypertension: Secondary | ICD-10-CM | POA: Diagnosis present

## 2018-12-10 DIAGNOSIS — E785 Hyperlipidemia, unspecified: Secondary | ICD-10-CM | POA: Insufficient documentation

## 2018-12-10 LAB — CBC WITH DIFFERENTIAL/PLATELET
Abs Immature Granulocytes: 0.02 10*3/uL (ref 0.00–0.07)
Basophils Absolute: 0 10*3/uL (ref 0.0–0.1)
Basophils Relative: 0 %
Eosinophils Absolute: 0.2 10*3/uL (ref 0.0–0.5)
Eosinophils Relative: 2 %
HCT: 40.3 % (ref 36.0–46.0)
Hemoglobin: 12.8 g/dL (ref 12.0–15.0)
Immature Granulocytes: 0 %
Lymphocytes Relative: 42 %
Lymphs Abs: 4 10*3/uL (ref 0.7–4.0)
MCH: 27.8 pg (ref 26.0–34.0)
MCHC: 31.8 g/dL (ref 30.0–36.0)
MCV: 87.6 fL (ref 80.0–100.0)
Monocytes Absolute: 0.6 10*3/uL (ref 0.1–1.0)
Monocytes Relative: 6 %
Neutro Abs: 4.7 10*3/uL (ref 1.7–7.7)
Neutrophils Relative %: 50 %
Platelets: 356 10*3/uL (ref 150–400)
RBC: 4.6 MIL/uL (ref 3.87–5.11)
RDW: 12.9 % (ref 11.5–15.5)
WBC: 9.5 10*3/uL (ref 4.0–10.5)
nRBC: 0 % (ref 0.0–0.2)

## 2018-12-10 LAB — URINALYSIS, ROUTINE W REFLEX MICROSCOPIC
Bilirubin Urine: NEGATIVE
Glucose, UA: NEGATIVE mg/dL
Ketones, ur: NEGATIVE mg/dL
Leukocytes,Ua: NEGATIVE
Nitrite: NEGATIVE
Protein, ur: NEGATIVE mg/dL
Specific Gravity, Urine: 1.016 (ref 1.005–1.030)
pH: 5 (ref 5.0–8.0)

## 2018-12-10 LAB — COMPREHENSIVE METABOLIC PANEL
ALT: 21 U/L (ref 0–44)
AST: 25 U/L (ref 15–41)
Albumin: 4.1 g/dL (ref 3.5–5.0)
Alkaline Phosphatase: 105 U/L (ref 38–126)
Anion gap: 11 (ref 5–15)
BUN: 19 mg/dL (ref 6–20)
CO2: 26 mmol/L (ref 22–32)
Calcium: 9.6 mg/dL (ref 8.9–10.3)
Chloride: 102 mmol/L (ref 98–111)
Creatinine, Ser: 0.85 mg/dL (ref 0.44–1.00)
GFR calc Af Amer: >60 mL/min (ref >60)
GFR calc non Af Amer: >60 mL/min (ref >60)
Glucose, Bld: 97 mg/dL (ref 70–99)
Potassium: 3.3 mmol/L — ABNORMAL LOW (ref 3.5–5.1)
Sodium: 139 mmol/L (ref 135–145)
Total Bilirubin: 0.5 mg/dL (ref 0.3–1.2)
Total Protein: 7.6 g/dL (ref 6.5–8.1)

## 2018-12-10 LAB — LIPID PANEL
Cholesterol: 213 mg/dL — ABNORMAL HIGH (ref 0–200)
HDL: 50 mg/dL (ref >40)
LDL Cholesterol: 138 mg/dL — ABNORMAL HIGH (ref 0–99)
Total CHOL/HDL Ratio: 4.3 RATIO
Triglycerides: 124 mg/dL (ref <150)
VLDL: 25 mg/dL (ref 0–40)

## 2018-12-10 LAB — TSH: TSH: 1.768 u[IU]/mL (ref 0.350–4.500)

## 2018-12-10 LAB — HEMOGLOBIN A1C
Hgb A1c MFr Bld: 5.1 % (ref 4.8–5.6)
Mean Plasma Glucose: 99.67 mg/dL

## 2018-12-11 LAB — MICROALBUMIN / CREATININE URINE RATIO
Creatinine, Urine: 163.9 mg/dL
Microalb Creat Ratio: 4 mg/g creat (ref 0–29)
Microalb, Ur: 6.3 ug/mL — ABNORMAL HIGH

## 2018-12-11 LAB — VITAMIN D 25 HYDROXY (VIT D DEFICIENCY, FRACTURES): Vit D, 25-Hydroxy: 14.5 ng/mL — ABNORMAL LOW (ref 30.0–100.0)

## 2018-12-23 ENCOUNTER — Ambulatory Visit (HOSPITAL_COMMUNITY): Payer: Medicaid Other

## 2019-01-05 DIAGNOSIS — Z0271 Encounter for disability determination: Secondary | ICD-10-CM

## 2019-01-18 ENCOUNTER — Other Ambulatory Visit: Payer: Self-pay | Admitting: Neurosurgery

## 2019-01-27 NOTE — H&P (Signed)
Chief Complaint   Aneurysm   HPI   HPI: Judy Boyd is a 58 y.o. female   Who was found to have a a right anterior communicating artery aneurysm during workup after suffering a left pontine and left thalamic stroke in November 2019.  She presents today for diagnostic cerebral  Angiogram for further characterization of the aneurysm.  She is without any concerns today.  Of note, she does have a history of hypertension and is a former smoker.  She stopped smoking after stroke back in November.  She does believe her father had a brain aneurysm.  Patient Active Problem List   Diagnosis Date Noted  . Dental disease 07/02/2018  . Hemiparesis affecting right side as late effect of stroke ([place]) 06/17/2018  . Stroke (cerebrum) ([place]) 06/17/2018  . H/O medication noncompliance   . Chronic pain syndrome   . Leukocytosis   . Acute ischemic stroke ([place]) 06/13/2018  . Tobacco abuse 06/13/2018  . Lacunar stroke ([place]) 06/12/2018  . Hypertension   . COPD (chronic obstructive pulmonary disease) ([place])   . GERD (gastroesophageal reflux disease) 04/06/2012  . Abdominal pain 04/06/2012  . Nausea & vomiting 04/06/2012  . Melena 04/06/2012    PMH: Past Medical History:  Diagnosis Date  . Anxiety   . Arthritis   . Asthma   . Bowel obstruction ([place])   . Chronic pain   . COPD (chronic obstructive pulmonary disease) ([place])   . Depression   . GERD (gastroesophageal reflux disease)   . Hypertension   . Stroke [facility])     PSH: Past Surgical History:  Procedure Laterality Date  . ABDOMINAL SURGERY  2003   blockage  . left salpingectomy    . uterine ablation      (Not in a hospital admission)   SH: Social History   Tobacco Use  . Smoking status: Current Every Day Smoker    Packs/day: 1.00    Years: 10.00    Pack years: 10.00    Types: Cigarettes  . Smokeless tobacco: Never Used  Substance Use Topics  . Alcohol use: Yes    Comment: 2 beers per week  . Drug use: No    MEDS:  Prior to Admission medications   Medication Sig Start Date End Date Taking? Authorizing Provider  acetaminophen (TYLENOL) 325 MG tablet Take 1-2 tablets (325-650 mg total) by mouth every 4 (four) hours as needed for mild pain. 07/01/18   Love, Ivan Anchors, PA-C  albuterol (PROVENTIL HFA;VENTOLIN HFA) 108 (90 BASE) MCG/ACT inhaler Inhale 2 puffs into the lungs every 6 (six) hours as needed. Shortness of breath    [provider]  aspirin EC 81 MG tablet Take 81 mg by mouth daily.    [provider]  atorvastatin (LIPITOR) 80 MG tablet Take 80 mg by mouth daily.    [provider]  budesonide-formoterol (SYMBICORT) 160-4.5 MCG/ACT inhaler Inhale 2 puffs into the lungs 2 (two) times daily.     [provider]  busPIRone (BUSPAR) 15 MG tablet Take 15 mg by mouth 2 (two) times daily.    [provider]  gabapentin (NEURONTIN) 300 MG capsule Take 300 mg by mouth 3 (three) times daily.    [provider]  hydrochlorothiazide (HYDRODIURIL) 25 MG tablet Take 25 mg by mouth daily.    [provider]    ALLERGY: Allergies  Allergen Reactions  . Amlodipine Other (See Comments)    Dehydration     Social History  Tobacco Use  . Smoking status: Current Every Day Smoker    Packs/day: 1.00    Years: 10.00    Pack years: 10.00    Types: Cigarettes  . Smokeless tobacco: Never Used  Substance Use Topics  . Alcohol use: Yes    Comment: 2 beers per week     Family History  Problem Relation Age of Onset  . Heart disease Mother   . Heart disease Father      ROS   ROS  Exam   There were no vitals filed for this visit. General appearance: WDWN, NAD  Eyes: No scleral injection Cardiovascular: Regular rate and rhythm without murmurs, rubs, gallops. No edema or variciosities. Distal pulses normal. Pulmonary: Effort normal, non-labored breathing Musculoskeletal:     Muscle tone upper extremities: Normal    Muscle tone lower  extremities: Normal    Motor exam: RUE/RLE 4-/5, LUE/LLE 5/5 Neurological Mental Status:    - Patient is awake, alert, oriented to person, place, month, year, and situation    - Patient is able to give a clear and coherent history.    - No signs of aphasia or neglect Cranial Nerves    - II: Visual Fields are full. PERRL    - III/IV/VI: EOMI without ptosis or diploplia.     - V: Facial sensation is grossly normal    - VII: Facial movement is symmetric.     - VIII: hearing is intact to voice    - X: Uvula elevates symmetrically    - XI: Shoulder shrug is symmetric.    - XII: tongue is midline without atrophy or fasciculations.  Sensory: Sensation grossly intact to LT  Results - Imaging/Labs   No results found for this or any previous visit (from the past 48 hour(s)).  No results found.  IMAGING: MRA of the brain dated 06/16/2018 was reviewed.  This demonstrates a fetal type right posterior cerebral artery with an approximately 5 millimeter aneurysm at the origin.  Impression/Plan   58 y.o. 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  She presents today for diagnostic cerebral angiogram for further characterization of the aneurysm.  While in the office risks, benefits and alternatives to the procedure were discussed.  Patient stated in own language understanding wish to proceed.   Ferne Reus, PA-C [facility] [employer]

## 2019-01-28 ENCOUNTER — Ambulatory Visit (HOSPITAL_COMMUNITY)
Admission: RE | Admit: 2019-01-28 | Discharge: 2019-01-28 | Disposition: A | Discharge status: Home / Self Care | Payer: Medicaid Other | Source: Ambulatory Visit | Attending: Neurosurgery | Admitting: Neurosurgery

## 2019-01-28 ENCOUNTER — Encounter (HOSPITAL_COMMUNITY): Payer: Self-pay

## 2019-02-01 ENCOUNTER — Encounter: Payer: Self-pay | Admitting: Adult Health

## 2019-02-01 NOTE — Telephone Encounter (Signed)
This encounter was created in error - please disregard.

## 2019-04-25 ENCOUNTER — Encounter: Payer: Self-pay | Admitting: Internal Medicine

## 2019-04-26 NOTE — Progress Notes (Signed)
This encounter was created in error - please disregard.

## 2019-08-16 ENCOUNTER — Telehealth: Payer: Self-pay | Admitting: *Deleted

## 2019-08-16 NOTE — Telephone Encounter (Signed)
Angel called from [facility] requesting order for wheelchair and notes for Judy Boyd.  I have called [place] back and left message that the patient has not been seen since 08/20/2018 and would need an appointment for the wheelchair to be ordered.

## 2020-02-01 ENCOUNTER — Other Ambulatory Visit (HOSPITAL_COMMUNITY): Payer: Self-pay | Admitting: Family Medicine

## 2020-02-01 DIAGNOSIS — Z1231 Encounter for screening mammogram for malignant neoplasm of breast: Secondary | ICD-10-CM

## 2020-02-09 ENCOUNTER — Ambulatory Visit (HOSPITAL_COMMUNITY): Payer: Medicaid Other

## 2020-03-09 ENCOUNTER — Other Ambulatory Visit: Payer: Self-pay

## 2020-03-09 ENCOUNTER — Ambulatory Visit (HOSPITAL_COMMUNITY)
Admission: RE | Admit: 2020-03-09 | Discharge: 2020-03-09 | Disposition: A | Discharge status: Home / Self Care | Payer: Medicaid Other | Source: Ambulatory Visit | Attending: Family Medicine | Admitting: Family Medicine

## 2020-03-09 DIAGNOSIS — Z1231 Encounter for screening mammogram for malignant neoplasm of breast: Secondary | ICD-10-CM | POA: Insufficient documentation

## 2020-05-16 NOTE — Telephone Encounter (Signed)
error 

## 2020-10-25 ENCOUNTER — Encounter: Payer: Self-pay | Admitting: Internal Medicine

## 2020-10-25 DIAGNOSIS — I69351 Hemiplegia and hemiparesis following cerebral infarction affecting right dominant side: Secondary | ICD-10-CM

## 2020-10-25 DIAGNOSIS — I729 Aneurysm of unspecified site: Secondary | ICD-10-CM

## 2020-10-26 NOTE — Progress Notes (Signed)
This encounter was created in error - please disregard.

## 2022-04-01 ENCOUNTER — Encounter: Payer: Self-pay | Admitting: *Deleted

## 2022-04-26 ENCOUNTER — Emergency Department (HOSPITAL_COMMUNITY): Payer: Medicare HMO

## 2022-04-26 ENCOUNTER — Encounter (HOSPITAL_COMMUNITY): Payer: Self-pay | Admitting: Emergency Medicine

## 2022-04-26 ENCOUNTER — Other Ambulatory Visit: Payer: Self-pay

## 2022-04-26 ENCOUNTER — Inpatient Hospital Stay (HOSPITAL_COMMUNITY)
Admission: EM | Admit: 2022-04-26 | Discharge: 2022-04-27 | DRG: 065 | Discharge status: Against Medical Advice | Payer: Medicare HMO | Attending: Internal Medicine | Admitting: Internal Medicine

## 2022-04-26 DIAGNOSIS — R531 Weakness: Secondary | ICD-10-CM | POA: Diagnosis not present

## 2022-04-26 DIAGNOSIS — I69351 Hemiplegia and hemiparesis following cerebral infarction affecting right dominant side: Secondary | ICD-10-CM

## 2022-04-26 DIAGNOSIS — K219 Gastro-esophageal reflux disease without esophagitis: Secondary | ICD-10-CM | POA: Diagnosis present

## 2022-04-26 DIAGNOSIS — I10 Essential (primary) hypertension: Secondary | ICD-10-CM | POA: Diagnosis present

## 2022-04-26 DIAGNOSIS — I160 Hypertensive urgency: Secondary | ICD-10-CM | POA: Diagnosis not present

## 2022-04-26 DIAGNOSIS — Z20822 Contact with and (suspected) exposure to covid-19: Secondary | ICD-10-CM | POA: Diagnosis present

## 2022-04-26 DIAGNOSIS — R079 Chest pain, unspecified: Secondary | ICD-10-CM | POA: Diagnosis present

## 2022-04-26 DIAGNOSIS — Z7951 Long term (current) use of inhaled steroids: Secondary | ICD-10-CM | POA: Diagnosis not present

## 2022-04-26 DIAGNOSIS — F32A Depression, unspecified: Secondary | ICD-10-CM | POA: Diagnosis present

## 2022-04-26 DIAGNOSIS — G8929 Other chronic pain: Secondary | ICD-10-CM | POA: Diagnosis present

## 2022-04-26 DIAGNOSIS — F419 Anxiety disorder, unspecified: Secondary | ICD-10-CM | POA: Diagnosis not present

## 2022-04-26 DIAGNOSIS — I639 Cerebral infarction, unspecified: Secondary | ICD-10-CM | POA: Diagnosis not present

## 2022-04-26 DIAGNOSIS — Z91199 Patient's noncompliance with other medical treatment and regimen due to unspecified reason: Secondary | ICD-10-CM | POA: Diagnosis not present

## 2022-04-26 DIAGNOSIS — E119 Type 2 diabetes mellitus without complications: Secondary | ICD-10-CM | POA: Diagnosis present

## 2022-04-26 DIAGNOSIS — W19XXXA Unspecified fall, initial encounter: Secondary | ICD-10-CM | POA: Diagnosis not present

## 2022-04-26 DIAGNOSIS — Z91148 Patient's other noncompliance with medication regimen for other reason: Secondary | ICD-10-CM | POA: Diagnosis not present

## 2022-04-26 DIAGNOSIS — R2990 Unspecified symptoms and signs involving the nervous system: Secondary | ICD-10-CM

## 2022-04-26 DIAGNOSIS — Z79899 Other long term (current) drug therapy: Secondary | ICD-10-CM | POA: Diagnosis not present

## 2022-04-26 DIAGNOSIS — I6523 Occlusion and stenosis of bilateral carotid arteries: Secondary | ICD-10-CM | POA: Diagnosis not present

## 2022-04-26 DIAGNOSIS — Z5329 Procedure and treatment not carried out because of patient's decision for other reasons: Secondary | ICD-10-CM | POA: Diagnosis not present

## 2022-04-26 DIAGNOSIS — Z743 Need for continuous supervision: Secondary | ICD-10-CM | POA: Diagnosis not present

## 2022-04-26 DIAGNOSIS — J449 Chronic obstructive pulmonary disease, unspecified: Secondary | ICD-10-CM | POA: Diagnosis not present

## 2022-04-26 DIAGNOSIS — R252 Cramp and spasm: Secondary | ICD-10-CM | POA: Diagnosis not present

## 2022-04-26 DIAGNOSIS — I6381 Other cerebral infarction due to occlusion or stenosis of small artery: Secondary | ICD-10-CM | POA: Diagnosis not present

## 2022-04-26 DIAGNOSIS — I671 Cerebral aneurysm, nonruptured: Secondary | ICD-10-CM | POA: Diagnosis present

## 2022-04-26 DIAGNOSIS — Z8249 Family history of ischemic heart disease and other diseases of the circulatory system: Secondary | ICD-10-CM

## 2022-04-26 DIAGNOSIS — R69 Illness, unspecified: Secondary | ICD-10-CM | POA: Diagnosis not present

## 2022-04-26 DIAGNOSIS — F17210 Nicotine dependence, cigarettes, uncomplicated: Secondary | ICD-10-CM | POA: Diagnosis present

## 2022-04-26 DIAGNOSIS — R471 Dysarthria and anarthria: Secondary | ICD-10-CM | POA: Diagnosis present

## 2022-04-26 DIAGNOSIS — Z7982 Long term (current) use of aspirin: Secondary | ICD-10-CM | POA: Diagnosis not present

## 2022-04-26 DIAGNOSIS — J441 Chronic obstructive pulmonary disease with (acute) exacerbation: Secondary | ICD-10-CM | POA: Diagnosis present

## 2022-04-26 DIAGNOSIS — E785 Hyperlipidemia, unspecified: Secondary | ICD-10-CM | POA: Diagnosis present

## 2022-04-26 DIAGNOSIS — I6502 Occlusion and stenosis of left vertebral artery: Secondary | ICD-10-CM | POA: Diagnosis not present

## 2022-04-26 LAB — URINALYSIS, ROUTINE W REFLEX MICROSCOPIC
Bacteria, UA: NONE SEEN
Bilirubin Urine: NEGATIVE
Glucose, UA: NEGATIVE mg/dL
Ketones, ur: NEGATIVE mg/dL
Leukocytes,Ua: NEGATIVE
Nitrite: NEGATIVE
Protein, ur: NEGATIVE mg/dL
Specific Gravity, Urine: 1.006 (ref 1.005–1.030)
pH: 6 (ref 5.0–8.0)

## 2022-04-26 LAB — CBG MONITORING, ED: Glucose-Capillary: 103 mg/dL — ABNORMAL HIGH (ref 70–99)

## 2022-04-26 LAB — COMPREHENSIVE METABOLIC PANEL
ALT: 10 U/L (ref 0–44)
AST: 17 U/L (ref 15–41)
Albumin: 3.7 g/dL (ref 3.5–5.0)
Alkaline Phosphatase: 102 U/L (ref 38–126)
Anion gap: 7 (ref 5–15)
BUN: 10 mg/dL (ref 6–20)
CO2: 24 mmol/L (ref 22–32)
Calcium: 9.1 mg/dL (ref 8.9–10.3)
Chloride: 111 mmol/L (ref 98–111)
Creatinine, Ser: 0.89 mg/dL (ref 0.44–1.00)
GFR, Estimated: >60 mL/min (ref >60)
Glucose, Bld: 100 mg/dL — ABNORMAL HIGH (ref 70–99)
Potassium: 3.6 mmol/L (ref 3.5–5.1)
Sodium: 142 mmol/L (ref 135–145)
Total Bilirubin: 0.6 mg/dL (ref 0.3–1.2)
Total Protein: 7.4 g/dL (ref 6.5–8.1)

## 2022-04-26 LAB — RAPID URINE DRUG SCREEN, HOSP PERFORMED
Amphetamines: NOT DETECTED
Barbiturates: NOT DETECTED
Benzodiazepines: NOT DETECTED
Cocaine: NOT DETECTED
Opiates: NOT DETECTED
Tetrahydrocannabinol: NOT DETECTED

## 2022-04-26 LAB — PROTIME-INR
INR: 0.9 (ref 0.8–1.2)
Prothrombin Time: 12.2 seconds (ref 11.4–15.2)

## 2022-04-26 LAB — CBC
HCT: 40.1 % (ref 36.0–46.0)
Hemoglobin: 12.7 g/dL (ref 12.0–15.0)
MCH: 27.9 pg (ref 26.0–34.0)
MCHC: 31.7 g/dL (ref 30.0–36.0)
MCV: 87.9 fL (ref 80.0–100.0)
Platelets: 310 10*3/uL (ref 150–400)
RBC: 4.56 MIL/uL (ref 3.87–5.11)
RDW: 12.4 % (ref 11.5–15.5)
WBC: 14.3 10*3/uL — ABNORMAL HIGH (ref 4.0–10.5)
nRBC: 0 % (ref 0.0–0.2)

## 2022-04-26 MED ORDER — ONDANSETRON HCL 4 MG/2ML IJ SOLN: 4.0000 mg | Freq: Four times a day (QID) | INTRAMUSCULAR | Status: DC | PRN

## 2022-04-26 MED ORDER — PANTOPRAZOLE SODIUM 40 MG PO TBEC
40.0000 mg | DELAYED_RELEASE_TABLET | Freq: Every day | ORAL | Status: DC
  Administered 2022-04-27: 40 mg via ORAL
  Filled 2022-04-26: qty 1

## 2022-04-26 MED ORDER — POLYETHYLENE GLYCOL 3350 17 G PO PACK: 17.0000 g | PACK | Freq: Every day | ORAL | Status: DC | PRN

## 2022-04-26 MED ORDER — STROKE: EARLY STAGES OF RECOVERY BOOK: Freq: Once | Status: AC

## 2022-04-26 MED ORDER — ASPIRIN 81 MG PO TBEC
81.0000 mg | DELAYED_RELEASE_TABLET | Freq: Every day | ORAL | Status: DC
  Administered 2022-04-26 – 2022-04-27 (×2): 81 mg via ORAL
  Filled 2022-04-26 (×2): qty 1

## 2022-04-26 MED ORDER — ACETAMINOPHEN 325 MG PO TABS: 650.0000 mg | ORAL_TABLET | Freq: Four times a day (QID) | ORAL | Status: DC | PRN

## 2022-04-26 MED ORDER — HYDROCHLOROTHIAZIDE 25 MG PO TABS
25.0000 mg | ORAL_TABLET | Freq: Once | ORAL | Status: AC
  Administered 2022-04-26: 25 mg via ORAL
  Filled 2022-04-26: qty 1

## 2022-04-26 MED ORDER — HYDRALAZINE HCL 20 MG/ML IJ SOLN
10.0000 mg | Freq: Four times a day (QID) | INTRAMUSCULAR | Status: DC | PRN
  Administered 2022-04-26: 10 mg via INTRAVENOUS
  Filled 2022-04-26: qty 1

## 2022-04-26 MED ORDER — ENOXAPARIN SODIUM 40 MG/0.4ML IJ SOSY
40.0000 mg | PREFILLED_SYRINGE | INTRAMUSCULAR | Status: DC
  Filled 2022-04-26: qty 0.4

## 2022-04-26 MED ORDER — ONDANSETRON HCL 4 MG PO TABS: 4.0000 mg | ORAL_TABLET | Freq: Four times a day (QID) | ORAL | Status: DC | PRN

## 2022-04-26 MED ORDER — ALBUTEROL SULFATE (2.5 MG/3ML) 0.083% IN NEBU
2.5000 mg | INHALATION_SOLUTION | RESPIRATORY_TRACT | Status: DC | PRN
Start: 2022-04-26 — End: 2022-04-27
  Administered 2022-04-26 – 2022-04-27 (×2): 2.5 mg via RESPIRATORY_TRACT
  Filled 2022-04-26 (×2): qty 3

## 2022-04-26 MED ORDER — IOHEXOL 350 MG/ML SOLN
100.0000 mL | Freq: Once | INTRAVENOUS | Status: AC | PRN
  Administered 2022-04-26: 100 mL via INTRAVENOUS

## 2022-04-26 MED ORDER — ATORVASTATIN CALCIUM 40 MG PO TABS
40.0000 mg | ORAL_TABLET | Freq: Every day | ORAL | Status: DC
  Filled 2022-04-26 (×2): qty 1

## 2022-04-26 MED ORDER — ACETAMINOPHEN 650 MG RE SUPP: 650.0000 mg | Freq: Four times a day (QID) | RECTAL | Status: DC | PRN

## 2022-04-26 NOTE — ED Notes (Addendum)
I was flagged down by the patient in the ED lobby and asked how I could help this patient. This patient expressed her frustration that she had been waiting all day in the lobby and did not want to wait here because she was "promised a room by [facility]". I explained to the patient that she was transferred by [facility] from [facility] for an MRI and that she was seen by the doctors at [facility]. Pt continued to cuss at me and her daughter came from the security desk to yell at me as well. Her daughter was demanding a Librarian, academic and continuing to be disrespectful and cussing security and myself. As much as I attempted to reassure patient and her family, I was unable to finish my explanation and steps to her care. Daughter told the patient to "shutup, let me do all the talking." Charge RN aware of event.

## 2022-04-26 NOTE — ED Notes (Signed)
Patient transported to MRI 

## 2022-04-26 NOTE — ED Notes (Signed)
The edp has ordered nasal 02 at 2 liters for this pt at the request of her family  02 placed

## 2022-04-26 NOTE — ED Notes (Signed)
Report given to [facility]  

## 2022-04-26 NOTE — Assessment & Plan Note (Signed)
   Performing serial neurologic checks  Monitoring patient on telemetry  Initiating antiplatelet therapy including aspirin 81 mg daily  Initiating atorvastatin 40 mg daily per neurology recommendations  Obtaining hemoglobin A1c and lipid panel in the morning  Echocardiogram in the morning with bubble study  PT, OT, SLP evaluation  Permissive hypertension with as needed antihypertensives only to be given if blood pressure greater than 220/115  Neurology following in consultation, their assistance is appreciated.

## 2022-04-26 NOTE — ED Notes (Signed)
The admitting doctor has been in to see this pt   the pt was arguing and being rude to the doctor also     she reported to him she had asked for a pillow but did not receive one  I had offered the pillow to her but she refused it until the tech came in and she took it.  The daughter had requested the pt be placed on nasal o2  which she was given,, then the tech came in and the  daughter asked him to remove the 02 because it was making the pts breathing worse so he removed it,   which the daughter felt was better than  me giving 02 at the daughters request.  The pt was very rude to the admitting doctor george.

## 2022-04-26 NOTE — Assessment & Plan Note (Signed)
.   Patient is being counseled daily on smoking cessation. . Patient declining nicotine replacement therapy

## 2022-04-26 NOTE — ED Provider Notes (Signed)
[facility] EMERGENCY DEPARTMENT Provider Note   CSN: [id number] Arrival date & time: 04/26/22  1448     History  Chief Complaint  Patient presents with   Weakness    Judy Boyd is a 61 y.o. female.  Patient is a 61 year old female with a past medical history of CVA with right-sided deficits, hypertension, and COPD presenting to the emergency department with weakness.  The patient states that she woke up around 4 AM and tried to get out of bed to go to the bathroom but was unable to get up due to weakness.  She states that she tried again a few hours later and still was unable to get up so she called 911.  The patient states that she feels better now that she is here at the ER.  She states that her weakness felt more like she could not sit up rather than 1 side was more weak compared to the other.  She states she has otherwise been feeling well recently without any fevers, cough, congestion or sore throat, chest pain or shortness of breath, dysuria or hematuria.  Per her daughter she spoke to her on the phone around 9:30 PM last night and she talked to her this morning her speech sounded slurred.  She states that this seems similar to when she had her last stroke.  She states her mom has been noncompliant with her medications and has not seen a doctor in 2 years.  On EMS evaluation, they did not have any focal neurologic deficits.  The history is provided by the patient, a relative and the EMS personnel.  Weakness      Home Medications Prior to Admission medications   Medication Sig Start Date End Date Taking? Authorizing Provider  acetaminophen (TYLENOL) 325 MG tablet Take 1-2 tablets (325-650 mg total) by mouth every 4 (four) hours as needed for mild pain. 07/01/18   Love, Ivan Anchors, PA-C  albuterol (PROVENTIL HFA;VENTOLIN HFA) 108 (90 BASE) MCG/ACT inhaler Inhale 2 puffs into the lungs every 6 (six) hours as needed. Shortness of breath    [provider]  aspirin EC 81  MG tablet Take 81 mg by mouth daily.    [provider]  atorvastatin (LIPITOR) 80 MG tablet Take 80 mg by mouth daily.    [provider]  budesonide-formoterol (SYMBICORT) 160-4.5 MCG/ACT inhaler Inhale 2 puffs into the lungs 2 (two) times daily.     [provider]  busPIRone (BUSPAR) 15 MG tablet Take 15 mg by mouth 2 (two) times daily.    [provider]  gabapentin (NEURONTIN) 300 MG capsule Take 300 mg by mouth 3 (three) times daily.    [provider]  hydrochlorothiazide (HYDRODIURIL) 25 MG tablet Take 25 mg by mouth daily.    [provider]      Allergies    Amlodipine    Review of Systems   Review of Systems  Neurological:  Positive for weakness.    Physical Exam Updated Vital Signs BP (!) 188/104   Pulse 97   Temp 99.2 F (37.3 C) (Oral)   Resp (!) 22   Ht '5\' 6"'$  (1.676 m)   Wt 62.6 kg   SpO2 97%   BMI 22.28 kg/m  Physical Exam Vitals and nursing note reviewed.  Constitutional:      General: She is not in acute distress.    Appearance: Normal appearance.  HENT:     Head: Normocephalic and atraumatic.  Mouth/Throat:     Mouth: Mucous membranes are moist.     Pharynx: Oropharynx is clear.  Eyes:     Extraocular Movements: Extraocular movements intact.     Conjunctiva/sclera: Conjunctivae normal.     Pupils: Pupils are equal, round, and reactive to light.  Cardiovascular:     Rate and Rhythm: Normal rate and regular rhythm.     Pulses: Normal pulses.     Heart sounds: Normal heart sounds.  Pulmonary:     Effort: Pulmonary effort is normal.     Breath sounds: Normal breath sounds.  Abdominal:     General: Abdomen is flat.     Palpations: Abdomen is soft.     Tenderness: There is no abdominal tenderness.  Musculoskeletal:        General: Normal range of motion.     Cervical back: Normal range of motion and neck supple.  Skin:    General: Skin is warm and dry.  Neurological:     Mental Status:  She is alert and oriented to person, place, and time. Mental status is at baseline.     Cranial Nerves: No cranial nerve deficit.     Sensory: No sensory deficit.     Motor: Weakness (5/5 strength in bilateral UE without drift. 4/5 strength with drift in RLE (at baseline per patient), 5/5 strength with no drift in LLE) present.     Coordination: Coordination normal.     Comments: Mild dysarthria  Psychiatric:        Mood and Affect: Mood normal.        Behavior: Behavior normal.    ED Results / Procedures / Treatments   Labs (all labs ordered are listed, but only abnormal results are displayed) Labs Reviewed  URINALYSIS, ROUTINE W REFLEX MICROSCOPIC - Abnormal; Notable for the following components:      Result Value   Color, Urine STRAW (*)    Hgb urine dipstick SMALL (*)    All other components within normal limits  COMPREHENSIVE METABOLIC PANEL - Abnormal; Notable for the following components:   Glucose, Bld 100 (*)    All other components within normal limits  CBC - Abnormal; Notable for the following components:   WBC 14.3 (*)    All other components within normal limits  CBG MONITORING, ED - Abnormal; Notable for the following components:   Glucose-Capillary 103 (*)    All other components within normal limits  RAPID URINE DRUG SCREEN, HOSP PERFORMED  PROTIME-INR    EKG None  Radiology DG Chest 1 View  Result Date: 04/26/2022 CLINICAL DATA:  Weakness EXAM: CHEST  1 VIEW COMPARISON:  08/01/2015 FINDINGS: The heart size and mediastinal contours are within normal limits. Both lungs are clear. The visualized skeletal structures are unremarkable. IMPRESSION: No active disease. Electronically Signed   By: Davina Poke D.O.   On: 04/26/2022 10:27    Procedures Procedures    Medications Ordered in ED Medications  iohexol (OMNIPAQUE) 350 MG/ML injection 100 mL (100 mLs Intravenous Contrast Given 04/26/22 1012)  hydrochlorothiazide (HYDRODIURIL) tablet 25 mg (25 mg Oral  Given 04/26/22 1031)    ED Course/ Medical Decision Making/ A&P Clinical Course as of 04/26/22 1151  Sat Apr 26, 2022  1008 Patient's daughter is at bedside.  She reports that her mom has been noncompliant with her medications and has not seen a primary doctor in the last 2 years.  She states that her speech does sound slurred compared to her baseline.  She  states that her last known well was 9:30 PM last night when they spoke on the phone.  She is outside the tPA window and thus does not need to be a stroke alert at this time but will continue subacute stroke work-up. [VK]  1009 Labs reviewed and interpreted by myself show an elevated white count of 14.  The patient denies any infectious symptoms.  Urine is pending at this time.  She will additionally have a chest x-ray to evaluate for infection as a cause of recrudescence of prior stroke. [VK]  1055 Patient CT imaging shows a stable appearing aneurysm as well as multiple chronic infarcts.  There are no obvious acute infarcts or narrowing of her blood vessels. [VK]  1133 I spoke with Dr. Malen Gauze of neurology who recommended ED to ED transfer for MRI.  They state that the MRI is negative they would recommend treatment for hypertensive urgency as a likely cause of recrudescence of her previous stroke symptoms.  Patient was accepted for transfer by Dr. Wyvonnia Dusky at [facility]. [VK]    Clinical Course User Index [VK] Ottie Glazier, DO                           Medical Decision Making This patient presents to the ED with chief complaint(s) of weakness with pertinent past medical history of CVA which further complicates the presenting complaint. The complaint involves an extensive differential diagnosis and also carries with it a high risk of complications and morbidity.    The differential diagnosis includes TIA, CVA, recrudescence of prior CVA, hypertensive emergency, electrolyte abnormality, anemia, ACS or arrhythmia  Additional history  obtained: Additional history obtained from family and EMS  Records reviewed previous admission documents  ED Course and Reassessment: Upon patient's arrival to the emergency department she had an NIH SS of 2 which the patient reports is at her baseline.  Last known well was 9:30 PM last night so she is not a tPA candidate and a stroke alert was not called.  Independent labs interpretation:  The following labs were independently interpreted: mildly elevated WBC otherwise within normal range  Independent visualization of imaging: - I independently visualized the following imaging with scope of interpretation limited to determining acute life threatening conditions related to emergency care: CXR, CTA H/neck, which revealed no acute disease  Consultation: - Consulted or discussed management/test interpretation w/ external professional: Neurology  Consideration for admission or further workup: Recommended ED to ED transfer for MRI to determine disposition Social Determinants of health: N/A    Amount and/or Complexity of Data Reviewed Labs: ordered. Radiology: ordered. ECG/medicine tests: ordered.  Risk Prescription drug management.           Final Clinical Impression(s) / ED Diagnoses Final diagnoses:  None    Rx / DC Orders ED Discharge Orders     None         Ottie Glazier, DO 04/26/22 1537

## 2022-04-26 NOTE — ED Notes (Signed)
Pt cursing almost every breath  they alll are attacking the staff. I understand their frustration but  quite a bit of the acting out is unnecessary  .  Neuro at  the bedside

## 2022-04-26 NOTE — ED Notes (Signed)
The pt had a swallow screen earlier today at [facility] and passed it there she has been eating tonight without difficulty

## 2022-04-26 NOTE — Consult Note (Signed)
NEURO HOSPITALIST CONSULT NOTE   Requestig physician: Dr. Pearline Cables  Reason for Consult: MRI with acute to subacute stroke overlapping the right internal capsule, lentiform nucleus and adjacent corona radiata  History obtained from:  Patient, Daughter and Chart     HPI:                                                                                                                                          Judy Boyd is an 61 y.o. female with a PMHx of stroke in 2019 with residual right sided weakness and dysarthria, anxiety, arthritis, asthma, chronic pain, COPD, depression and HTN who presented to the ED this AM with worsening of her chronic right sided weakness. LKN was at about 2 AM when she woke up to use the bathroom. On waking again at about 7-7:30 AM, she noted worsened right sided weakness. Due to the weakness, she was unable to ambulate and lowered herself to the floor. EMS was called and when they arrived, she was able to walk to the stretcher. CBG was 103 on arrival to the ED. She was noted to be hypertensive in the ED and on initial evaluation admitted to not being compliant with her antihypertensive medication. She also seems not to be aware of the need to be on either an antiplatelet medication or a blood thinner for secondary stroke prevention and claims that she was never prescribed with either of these medication classes after her stroke in 2019. Per EDP note, the patient stated that "her weakness felt more like she could not sit up rather than 1 side was more weak compared to the other."  Daughter states that at baseline the patient can walk but is with chronic right sided weakness. The patient's dysarthria chronically results in her speech being rated at about 70% of normal per daughter, with the current worsening dysarthria bringing her speech down to about 50%. No confusion or aphasia endorsed by patient and daughter.   Past Medical History:  Diagnosis Date    Anxiety    Arthritis    Asthma    Bowel obstruction (HCC)    Chronic pain    COPD (chronic obstructive pulmonary disease) (HCC)    Depression    GERD (gastroesophageal reflux disease)    Hypertension    Stroke [facility])     Past Surgical History:  Procedure Laterality Date   ABDOMINAL SURGERY  2003   blockage   left salpingectomy     uterine ablation      Family History  Problem Relation Age of Onset   Heart disease Mother    Heart disease Father             Social History:  reports that she has been smoking cigarettes. She has a  10.00 pack-year smoking history. She has never used smokeless tobacco. She reports current alcohol use. She reports that she does not use drugs.  No Active Allergies  HOME MEDICATIONS:                                                                                                                      No current facility-administered medications on file prior to encounter.   Current Outpatient Medications on File Prior to Encounter  Medication Sig Dispense Refill   albuterol (PROVENTIL HFA;VENTOLIN HFA) 108 (90 BASE) MCG/ACT inhaler Inhale 2 puffs into the lungs every 6 (six) hours as needed. Shortness of breath     amLODipine (NORVASC) 10 MG tablet Take 10 mg by mouth daily.     hydrochlorothiazide (HYDRODIURIL) 25 MG tablet Take 25 mg by mouth daily.    The patient states that she is noncompliant with her home medications.    ROS:                                                                                                                                       No cough, fevers, CP, SOB, hematuria or dysuria. Other ROS as per HPI.    Blood pressure (!) 170/111, pulse 85, temperature 99.3 F (37.4 C), temperature source Oral, resp. rate 16, height '5\' 6"'$  (1.676 m), weight 62.6 kg, SpO2 98 %.   General Examination:                                                                                                       Physical Exam  HEENT-   [place]/AT   Lungs- Respirations unlabored Extremities- No edema   Neurological Examination Mental Status: Awake and alert. Speech is significantly dysarthric but readily intelligible. No receptive or expressive aphasia; fluency, naming and comprehension are intact. Fully oriented.  Cranial Nerves: II: Temporal visual fields intact with no extinction to DSS. PERRL.  III,IV, VI: EOMI. No nystagmus. No ptosis.  V: Temp sensation equal  bilaterally. VII: Smile symmetric, purses lips symmetrically. VIII: Hearing intact to voice IX,X: Pharyngeal dysarthria noted XI: Head is midline XII: Midline tongue extension Motor: RUE with spastic weakness 4/5 proximally and distally as well as slowed movements and increased tone.  RLE with spastic weakness 4/5 proximally, 4-/5 ADF and APF. Increased extensor tone at knee.  LUE and LLE 5/5 Sensory: Temp and FT intact x 4. No extinction to DSS.  Deep Tendon Reflexes: 2+ right brachioradialis and biceps, 1+ left brachioradialis and biceps. 4+ bilateral patellae (clonus). Unable to elicit achilles reflexes. Right toe tonically upgoing, left toe downgoing.  Cerebellar: Mild dyssinergia with right FNF. Left FNF normal.  Gait: Deferred   Lab Results: Basic Metabolic Panel: Recent Labs  Lab 04/26/22 0932  NA 142  K 3.6  CL 111  CO2 24  GLUCOSE 100*  BUN 10  CREATININE 0.89  CALCIUM 9.1    CBC: Recent Labs  Lab 04/26/22 0932  WBC 14.3*  HGB 12.7  HCT 40.1  MCV 87.9  PLT 310    Cardiac Enzymes: No results for input(s): "CKTOTAL", "CKMB", "CKMBINDEX", "TROPONINI" in the last 168 hours.  Lipid Panel: No results for input(s): "CHOL", "TRIG", "HDL", "CHOLHDL", "VLDL", "LDLCALC" in the last 168 hours.  Imaging: MR BRAIN WO CONTRAST  Result Date: 04/26/2022 CLINICAL DATA:  Weakness, history of CVA EXAM: MRI HEAD WITHOUT CONTRAST MRA HEAD WITHOUT CONTRAST TECHNIQUE: Multiplanar, multi-echo pulse sequences of the brain and surrounding structures  were acquired without intravenous contrast. Angiographic images of the Circle of Willis were acquired using MRA technique without intravenous contrast. COMPARISON:  MRI head 06/16/2018, MRA head 06/06/2018; correlation is also made with CTA head and neck 04/26/2022 FINDINGS: MRI HEAD FINDINGS Brain: Faint restricted diffusion with ADC correlate in the posterosuperior right internal capsule/corona radiata and posterior lentiform nucleus (series 2, images 27-31). This area is associated with mildly increased T2 hyperintense signal. No acute hemorrhage, mass, mass effect, or midline shift. Focal encephalomalacia in the left thalamus and left pons correlate with the sequela of the acute infarcts seen on 06/16/2018. Additional lacunar infarct in the left caudate and external capsule. Confluent T2 hyperintense signal in the periventricular white matter and pons, likely the sequela of moderate chronic small vessel ischemic disease. No hydrocephalus or extra-axial collection. Vascular: Normal arterial flow voids. Skull and upper cervical spine: Normal marrow signal. Sinuses/Orbits: No acute finding. Other: Trace fluid in the right mastoid air cells. MRA HEAD FINDINGS Anterior circulation: Both internal carotid arteries are patent to the termini, without significant stenosis. Redemonstrated lobulated, laterally and anteriorly directed aneurysm from the right posterior communicating artery origin, which measures approximately 5 x 6 mm (series 3, image 80), grossly unchanged compared to 06/06/2018. The right posterior communicating artery arises from the aneurysm (series 3, image 77). A1 segments patent. Possible fenestrated anterior communicating artery. Anterior cerebral arteries are patent to their distal aspects. No M1 stenosis or occlusion. Evaluation of the anterior MCA branches is somewhat limited by motion. Within this limitation, distal MCA branches perfused and symmetric. Posterior circulation: Vertebral arteries  patent to the vertebrobasilar junction without stenosis. Posterior inferior cerebral arteries patent bilaterally. Basilar patent to its distal aspect. Superior cerebellar arteries patent bilaterally. Patent left P 1. Absent right P1 with a fetal origin of the right PCA from the right posterior communicating artery. PCAs perfused to their distal aspects without stenosis. The left posterior communicating artery is not definitively seen. Anatomic variants: Fetal origin of the right PCA. IMPRESSION: 1. Acute to subacute infarcts  in the right posterior lentiform nucleus, internal capsule, and corona radiata. No mass effect or associated hemorrhage. 2. No intracranial large vessel occlusion or significant stenosis. 3. 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 The right posterior cerebral artery originates from the right posterior communicating artery. These results were called by telephone at the time of interpretation on 04/26/2022 at 7:14 pm to provider GRAY , who verbally acknowledged these results. Electronically Signed   By: Merilyn Baba M.D.   On: 04/26/2022 19:15   MR ANGIO HEAD WO CONTRAST  Result Date: 04/26/2022 CLINICAL DATA:  Weakness, history of CVA EXAM: MRI HEAD WITHOUT CONTRAST MRA HEAD WITHOUT CONTRAST TECHNIQUE: Multiplanar, multi-echo pulse sequences of the brain and surrounding structures were acquired without intravenous contrast. Angiographic images of the Circle of Willis were acquired using MRA technique without intravenous contrast. COMPARISON:  MRI head 06/16/2018, MRA head 06/06/2018; correlation is also made with CTA head and neck 04/26/2022 FINDINGS: MRI HEAD FINDINGS Brain: Faint restricted diffusion with ADC correlate in the posterosuperior right internal capsule/corona radiata and posterior lentiform nucleus (series 2, images 27-31). This area is associated with mildly increased T2 hyperintense signal. No acute  hemorrhage, mass, mass effect, or midline shift. Focal encephalomalacia in the left thalamus and left pons correlate with the sequela of the acute infarcts seen on 06/16/2018. Additional lacunar infarct in the left caudate and external capsule. Confluent T2 hyperintense signal in the periventricular white matter and pons, likely the sequela of moderate chronic small vessel ischemic disease. No hydrocephalus or extra-axial collection. Vascular: Normal arterial flow voids. Skull and upper cervical spine: Normal marrow signal. Sinuses/Orbits: No acute finding. Other: Trace fluid in the right mastoid air cells. MRA HEAD FINDINGS Anterior circulation: Both internal carotid arteries are patent to the termini, without significant stenosis. Redemonstrated lobulated, laterally and anteriorly directed aneurysm from the right posterior communicating artery origin, which measures approximately 5 x 6 mm (series 3, image 80), grossly unchanged compared to 06/06/2018. The right posterior communicating artery arises from the aneurysm (series 3, image 77). A1 segments patent. Possible fenestrated anterior communicating artery. Anterior cerebral arteries are patent to their distal aspects. No M1 stenosis or occlusion. Evaluation of the anterior MCA branches is somewhat limited by motion. Within this limitation, distal MCA branches perfused and symmetric. Posterior circulation: Vertebral arteries patent to the vertebrobasilar junction without stenosis. Posterior inferior cerebral arteries patent bilaterally. Basilar patent to its distal aspect. Superior cerebellar arteries patent bilaterally. Patent left P 1. Absent right P1 with a fetal origin of the right PCA from the right posterior communicating artery. PCAs perfused to their distal aspects without stenosis. The left posterior communicating artery is not definitively seen. Anatomic variants: Fetal origin of the right PCA. IMPRESSION: 1. Acute to subacute infarcts in the right  posterior lentiform nucleus, internal capsule, and corona radiata. No mass effect or associated hemorrhage. 2. No intracranial large vessel occlusion or significant stenosis. 3. 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 The right posterior cerebral artery originates from the right posterior communicating artery. These results were called by telephone at the time of interpretation on 04/26/2022 at 7:14 pm to provider GRAY , who verbally acknowledged these results. Electronically Signed   By: Merilyn Baba M.D.   On: 04/26/2022 19:15   CT ANGIO HEAD NECK W WO CM  Result Date: 04/26/2022 CLINICAL DATA:  Right-sided weakness that began this morning EXAM:  CT ANGIOGRAPHY HEAD AND NECK TECHNIQUE: Multidetector CT imaging of the head and neck was performed using the standard protocol during bolus administration of intravenous contrast. Multiplanar CT image reconstructions and MIPs were obtained to evaluate the vascular anatomy. Carotid stenosis measurements (when applicable) are obtained utilizing NASCET criteria, using the distal internal carotid diameter as the denominator. RADIATION DOSE REDUCTION: This exam was performed according to the departmental dose-optimization program which includes automated exposure control, adjustment of the mA and/or kV according to patient size and/or use of iterative reconstruction technique. CONTRAST:  174m OMNIPAQUE IOHEXOL 350 MG/ML SOLN COMPARISON:  Brain MRI 06/16/2018. FINDINGS: CT HEAD FINDINGS Brain: Chronic small vessel infarcts at the left pons, left thalamus, and left caudate. No evidence of acute infarct. No hemorrhage, hydrocephalus, or collection. Vascular: See below Skull: No acute finding Sinuses/Orbits: No acute finding Review of the MIP images confirms the above findings CTA NECK FINDINGS Aortic arch: Atheromatous plaque. No acute finding. Three vessel branching Right carotid system:  Relatively mild mixed density plaque at the bifurcation without flow limiting stenosis or ulceration Left carotid system: Relatively mild, low-density atheromatous plaque at the distal common carotid and bifurcation without flow reducing stenosis or ulceration. Vertebral arteries: Calcified plaque at the left subclavian origin without flow reducing stenosis. The vertebral arteries are smoothly contoured and widely patent to the dura. Skeleton: Generalized cervical spine degeneration. Other neck: No acute finding Upper chest: Biapical emphysema. Review of the MIP images confirms the above findings CTA HEAD FINDINGS Anterior circulation: Atheromatous calcification of the carotid siphons. No branch occlusion, beading, or flow limiting stenosis affecting proximal vessels. Known aneurysm at the right proximal posterior communicating artery which serves the fetal type right PCA, aneurysm measuring 5.5 mm with mild anterior and inferior lobulation, size and appearance unchanged from prior MRA. Posterior circulation: The vertebral and basilar arteries are smoothly contoured and widely patent. No branch occlusion, beading, or aneurysm Venous sinuses: Diffusely patent Anatomic variants: None significant Review of the MIP images confirms the above findings IMPRESSION: 1. No emergent finding. 2. Atherosclerosis without flow limiting stenosis or occlusion of major arteries in the head and neck. 3. Multiple chronic lacunar infarcts. 4. 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 Electronically Signed   By: JJorje GuildM.D.   On: 04/26/2022 10:45   DG Chest 1 View  Result Date: 04/26/2022 CLINICAL DATA:  Weakness EXAM: CHEST  1 VIEW COMPARISON:  08/01/2015 FINDINGS: The heart size and mediastinal contours are within normal limits. Both lungs are clear. The visualized skeletal structures are unremarkable. IMPRESSION: No active disease. Electronically Signed   By: NDavina PokeD.O.   On: 04/26/2022 10:27     Assessment: 61year old female presenting to the ED with acute worsening of right sided weakness relative to her post-stroke baseline. MRI revealed an acute to subacute right lentiform nucleus/internal capsule/corona radiata stroke - Exam reveals findings referable to her old left pontine and basal ganglia lacunar infarctions. Unexpectedly, there is no left sided weakness to correlate with the acute stroke seen on her MRI.  - MRI brain: Acute to subacute infarcts in the right posterior lentiform nucleus, internal capsule, and corona radiata. There is focal encephalomalacia in the left thalamus and left pons consistent with sequelae of the acute infarcts seen on 06/16/2018. Additional lacunar infarct in the left caudate and external capsule. Confluent T2 hyperintense signal in the periventricular white matter and pons, likely the sequela of moderate chronic small vessel ischemic disease.  -  MRA head: No intracranial large vessel occlusion or significant stenosis. 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 The right posterior cerebral artery originates from the right posterior communicating artery. - CTA of head and neck:  No emergent finding. Atherosclerosis without flow limiting stenosis or occlusion of major arteries in the head and neck. 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 - Stroke risk factors: Prior stroke, medication noncompliance, uncontrolled HTN and smoking  Recommendations: - HgbA1c, fasting lipid panel - PT consult, OT consult, Speech consult - TTE - Start atorvastatin 40 mg po qd - Start ASA 81 mg po qd - BP management. 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  - Risk factor modification to include smoking cessation - Telemetry monitoring - Frequent neuro checks - NPO until passes stroke swallow screen - Will need to use any antiplatelet agents with caution given the 5.5 mm aneurysm at the origin of her right PCOM    - Will need regular imaging for surveillance of the PCOM aneurysm, with initial recommendation of yearly imaging intervals. May also need to follow up outpatient with Neurosurgery for this.     Electronically signed: Dr. Kerney Elbe 04/26/2022, 7:33 PM

## 2022-04-26 NOTE — ED Notes (Signed)
The pt does not want the lovenox  she wants pills instead the doctor reports not to give the lovenox  he would write for a pill

## 2022-04-26 NOTE — ED Provider Notes (Signed)
  Provider Note MRN:  [id number]  Arrival date & time: 04/26/22    ED Course and Medical Decision Making  Pt arrived as transfer from [place] from Dr Ernesto Rutherford for MRI  61 yo female arrived as transfer for MRI >Seen at [place]   -Hx prior CVA, non-compliant with her medications and still smoking tobacco  -pt with weakness, primarily to her right leg and unable to ambulate a/w dysarthria, around 4 am was symptom onset. LKN was aruond 2130 last night.   Neurology was consulted Dr Rory Percy; [facility] and CTA w/o acute CVA or LVO. SBP was >200. Recommended MRI at that time  Pt assessed in the ED, she does have ongoing dysarthria and 4/5 strength to her RLE. BLUE strength 5/5. Sensation is intact globally.  Her HA has resolved. No facial droop or sensation changes to her face. Dysarthria noted, no aphasia. No vision changes.   MRI has resulted; spoke with radiology; c/w acute vs subacute infarcts to right posterior lentiform nucleus, internal capsule, and corona radiate. Aneurysm stable to PCA.   Will d/w neurology on call, Dr Cheral Marker, agree w/ admission; they will see in consult.  Spoke with Dr Marlyce Huge who accepts pt for admission   Procedures  Final Clinical Impressions(s) / ED Diagnoses     ICD-10-CM   1. Weakness  R53.1     2. Dysarthria  R47.1     3. Hypertension, unspecified type  I10     4. Acute CVA (cerebrovascular accident) [facility])  I63.9       ED Discharge Orders     None       Discharge Instructions   None        Jeanell Sparrow, DO 04/26/22 2027

## 2022-04-26 NOTE — Assessment & Plan Note (Signed)
   Patient reports longstanding noncompliance with her antihypertensive regimen Permissive hypertension for now Holding home regimen of oral antihypertensives As needed intravenous intermittent is for markedly elevated blood pressure of greater than 220/115

## 2022-04-26 NOTE — ED Notes (Signed)
Patient transferred following teleneuro by [facility] from [facility] ED. Patient sent to triage to await MRI due to no bed availability. Family and patient cursing staff because "They were promised bed on arrival". Well of course I didn't promise anything and of course I would hope [facility] did not make promises for this campus. Family cursed staff in lobby, security and this RN when offered hallway bed. I tried to de-escalate and family continued to curse and talk very disrespectful to staff. I and Dorcas Mcmurray took patient into rest room and cleaned her up and patient back to hallway bed. They were offered to go back to lobby if didn't want hallway but they decided to wait in hallway 21.

## 2022-04-26 NOTE — Progress Notes (Signed)
ON CALL PHONE CONSULT  Call from Dr Ernesto Rutherford [facility]  [place], ER   Discussion: 61 year old with prior stroke with residual right-sided deficits coming in with worsening of the right-sided deficits and increasing slurred speech with a last known well at 9:30 PM last night.  Noncontrasted head CT as well as CT angiography head and neck unremarkable for acute process or LVO. Has hypertension with systolic blood pressure in the 200s. ER physician called to discuss options for management.  My assessment, based on the information provided to me is that this is likely recrudescence of old symptoms in the setting of hypertensive urgency versus a new small vessel infarct.  It would be important to know whether she has a stroke or not to decide how to proceed with managing her blood pressures.  My recommendations: - ED to ED transfer to [facility] for an MRI brain - If the MRI brain shows stroke, admit for stroke work-up and allow permissive hypertension for the next 48 to 72 hours. - If the MRI of the brain is negative, then this should be treated as a hypertensive urgency and management of blood pressure should be done by 20% reduction per day as you would for any other hypertensive urgency episode. - She should be advised to be compliant to her antiplatelet regimen, which I was told she has not been compliant with.  If the MRI shows a stroke, please call the inpatient neurology team at [facility] for formal inpatient consultation   -- Amie Portland, MD Neurologist [facility] Pager: [phone]

## 2022-04-26 NOTE — ED Triage Notes (Signed)
Pt to the ED with Right side weakness that began this morning around 0900.  Pt has a history of right side weakness for a previous stroke , however she is normally able to ambulate with no issues. Pt had to lower herself to the floor.  Pt was able to walk to the stretcher for EMS and had a Negative NIH scale with EMS.  Pt is hypertensive and has not been taking her BP medication per her daughter.  CBG 103.  EDP cleared th pt for TIA work up.

## 2022-04-26 NOTE — ED Notes (Signed)
Report received from [facility], right side weakness increased per family and increased slurred speech.  All deficits were from previous stroke already but family seems to think it is worse today. Dr Malen Gauze has already seen patient via teleneuro

## 2022-04-26 NOTE — H&P (Signed)
History and Physical    Patient: Judy Boyd MRN: [id number] DOA: 04/26/2022  Date of Service: the patient was seen and examined on 04/26/2022  Patient coming from: Home via [facility] ED  Chief Complaint:  Chief Complaint  Patient presents with   Weakness    HPI:   61 year old female with past medical history of COPD, hypertension, gastroesophageal reflux disease, nicotine dependence, right PCA aneurysm (conservative management, Dx 2019), as well as history of left pontine stroke with chronic right lower extremity weakness presenting to [facility] emergency department with complaints of of worsening right-sided weakness and difficulty with speech.  Patient explains that she woke up at approximately 2 AM the morning of 9/9 and upon ambulating to the restroom she states that she felt at baseline.  Later that morning at approximately 7 AM upon awakening again she noted that she had worsened right-sided weakness with inability to ambulate.  According to family, patient seemed to also experienced associated slurred speech that is different than her baseline.  Patient denies any visual changes or associated headache.  Patient denies any palpitations as of late.  EMS was contacted and promptly responded to the call, bring the patient to the [facility] emergency department for evaluation.  Upon evaluation in the emergency department patient was not considered a tPA candidate due to being outside of the tPA window.  EDP discussed case with neurology and patient was brought to the [facility] emergency department for urgent MRI evaluation.  Upon arrival, MRI of the brain revealed acute/subacute infarct involving the right posterior lentiform nucleus, internal capsule and corona radiata.  EDP then discussed case with Dr. Cheral Marker with neurology who agreed to see the patient in consultation.  The hospitalist group was then called to assess the patient for admission to the  hospital.  Review of Systems: Review of Systems  Neurological:  Positive for speech change and focal weakness.  All other systems reviewed and are negative.    Past Medical History:  Diagnosis Date   Anxiety    Arthritis    Asthma    Bowel obstruction (HCC)    Chronic pain    COPD (chronic obstructive pulmonary disease) (HCC)    Depression    GERD (gastroesophageal reflux disease)    Hypertension    Stroke [facility])     Past Surgical History:  Procedure Laterality Date   ABDOMINAL SURGERY  2003   blockage   left salpingectomy     uterine ablation      Social History:  reports that she has been smoking cigarettes. She has a 10.00 pack-year smoking history. She has never used smokeless tobacco. She reports current alcohol use. She reports that she does not use drugs.  No Active Allergies  Family History  Problem Relation Age of Onset   Heart disease Mother    Heart disease Father     Prior to Admission medications   Medication Sig Start Date End Date Taking? Authorizing Provider  albuterol (PROVENTIL HFA;VENTOLIN HFA) 108 (90 BASE) MCG/ACT inhaler Inhale 2 puffs into the lungs every 6 (six) hours as needed. Shortness of breath   Yes [provider]  amLODipine (NORVASC) 10 MG tablet Take 10 mg by mouth daily.   Yes [provider]  hydrochlorothiazide (HYDRODIURIL) 25 MG tablet Take 25 mg by mouth daily.   Yes [provider]    Physical Exam:  Vitals:   04/26/22 2000 04/26/22 2030 04/26/22 2100 04/26/22 2115  BP: (!) 116/100 Marland Kitchen)  192/124 (!) 144/91 (!) 192/122  Pulse: 91 94 94 (!) 103  Resp:      Temp:      TempSrc:      SpO2: 100% 100% 99% 96%  Weight:      Height:        Constitutional: Awake alert and oriented x3, patient is visibly angry Skin: no rashes, no lesions, good skin turgor noted. Eyes: Pupils are equally reactive to light.  No evidence of scleral icterus or conjunctival pallor.  ENMT: Moist mucous membranes noted.   Posterior pharynx clear of any exudate or lesions.   Neck: normal, supple, no masses, no thyromegaly.  No evidence of jugular venous distension.   Respiratory: clear to auscultation bilaterally, no wheezing, no crackles. Normal respiratory effort. No accessory muscle use.  Cardiovascular: Tachycardic but regular, no murmurs / rubs / gallops. No extremity edema. 2+ pedal pulses. No carotid bruits.  Chest:   Nontender without crepitus or deformity.   Back:   Nontender without crepitus or deformity. Abdomen: Abdomen is soft and nontender.  No evidence of intra-abdominal masses.  Positive bowel sounds noted in all quadrants.   Musculoskeletal: No joint deformity upper and lower extremities. Good ROM, no contractures. Normal muscle tone.  Neurologic: Strength 4 out of 5 of the right upper extremity with proximal and distal muscle groups.  Strength 4 out of 5 the right lower extremity in the proximal and distal muscle groups.  CN 2-12 grossly intact. Sensation intact.   Patient is following all commands.  Patient is responsive to verbal stimuli.   Psychiatric: Patient exhibiting extremely angry mood with labile affect.  Patient seems to possess insight as to their current situation.    Data Reviewed:  I have personally reviewed and interpreted labs, imaging.  Significant findings are   MRI Brain: Acute/subacute infarcts in in the right posterior lentiform nucleus, internal capsule and corona radiata without mass effect or associated hemorrhage.  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  Lab Results  Component Value Date   WBC 14.3 (H) 04/26/2022   HGB 12.7 04/26/2022   HCT 40.1 04/26/2022   MCV 87.9 04/26/2022   PLT 310 04/26/2022   Lab Results  Component Value Date   K 3.6 04/26/2022   Lab Results  Component Value Date   BUN 10 04/26/2022   Lab Results  Component Value Date   CREATININE 0.89 04/26/2022     CXR:   Chest X-ray was personally reviewed.  No evidence of focal infiltrates.  No evidence of pleural effusion.  No evidence of pneumothorax.    EKG: Personally reviewed.  Rhythm is normal sinus rhythm with heart rate of 71 bpm.  No dynamic ST segment changes appreciated.   Assessment and Plan: * Acute stroke due to ischemia [facility]) Performing serial neurologic checks Monitoring patient on telemetry Initiating antiplatelet therapy including aspirin 81 mg daily Initiating atorvastatin 40 mg daily per neurology recommendations Obtaining hemoglobin A1c and lipid panel in the morning Echocardiogram in the morning with bubble study PT, OT, SLP evaluation Permissive hypertension with as needed antihypertensives only to be given if blood pressure greater than 220/115 Neurology following in consultation, their assistance is appreciated.   Hypertensive urgency Patient reports longstanding noncompliance with her antihypertensive regimen Permissive hypertension for now Holding home regimen of oral antihypertensives As needed intravenous intermittent is for markedly elevated blood pressure of greater than 220/115   Chest pain Patient reports intermittent right-sided  atypical chest discomfort Patient also exhibiting concurrent tachycardia EKG reveals sinus tachycardia without dynamic ST segment change Obtaining troponin, D-dimer Echocardiogram in the morning Chest x-ray unremarkable Monitoring on telemetry  COPD (chronic obstructive pulmonary disease) ([place]) While patient does complain of some shortness of breath which has been worked up with a chest x-ray (unremarkable) and D-dimer (pending) there is no clinical evidence of a COPD exacerbation Patient is not on any maintenance inhalers in the outpatient setting As needed bronchodilator therapy for episodic shortness of breath and wheezing.   Nicotine dependence, cigarettes, uncomplicated Patient is being counseled daily on smoking  cessation. Patient declining nicotine replacement therapy   GERD (gastroesophageal reflux disease) Placing patient on daily PPI        Code Status:  Full code  code status decision has been confirmed with: patient Family Communication: deferred   Consults: Dr. Cheral Marker with Neurology  Severity of Illness:  The appropriate patient status for this patient is INPATIENT. Inpatient status is judged to be reasonable and necessary in order to provide the required intensity of service to ensure the patient's safety. The patient's presenting symptoms, physical exam findings, and initial radiographic and laboratory data in the context of their chronic comorbidities is felt to place them at high risk for further clinical deterioration. Furthermore, it is not anticipated that the patient will be medically stable for discharge from the hospital within 2 midnights of admission.   * I certify that at the point of admission it is my clinical judgment that the patient will require inpatient hospital care spanning beyond 2 midnights from the point of admission due to high intensity of service, high risk for further deterioration and high frequency of surveillance required.*  Author:  Vernelle Emerald MD  04/26/2022 11:41 PM

## 2022-04-26 NOTE — Assessment & Plan Note (Signed)
   While patient does complain of some shortness of breath which has been worked up with a chest x-ray (unremarkable) and D-dimer (pending) there is no clinical evidence of a COPD exacerbation  Patient is not on any maintenance inhalers in the outpatient setting  As needed bronchodilator therapy for episodic shortness of breath and wheezing.

## 2022-04-26 NOTE — Assessment & Plan Note (Addendum)
   Patient reports intermittent right-sided atypical chest discomfort  Patient also exhibiting concurrent tachycardia  EKG reveals sinus tachycardia without dynamic ST segment change  Obtaining troponin, D-dimer  Echocardiogram in the morning  Chest x-ray unremarkable  Monitoring on telemetry

## 2022-04-26 NOTE — ED Notes (Signed)
MRI contacted and will come and get patient for study within 15 minutes

## 2022-04-26 NOTE — Assessment & Plan Note (Signed)
.   Placing patient on daily PPI

## 2022-04-27 ENCOUNTER — Inpatient Hospital Stay (HOSPITAL_COMMUNITY): Payer: Medicare HMO

## 2022-04-27 ENCOUNTER — Other Ambulatory Visit (HOSPITAL_COMMUNITY): Payer: Medicare HMO

## 2022-04-27 DIAGNOSIS — R531 Weakness: Secondary | ICD-10-CM | POA: Diagnosis not present

## 2022-04-27 DIAGNOSIS — I6523 Occlusion and stenosis of bilateral carotid arteries: Secondary | ICD-10-CM | POA: Diagnosis not present

## 2022-04-27 DIAGNOSIS — I6389 Other cerebral infarction: Secondary | ICD-10-CM | POA: Diagnosis not present

## 2022-04-27 DIAGNOSIS — M6289 Other specified disorders of muscle: Secondary | ICD-10-CM | POA: Diagnosis not present

## 2022-04-27 DIAGNOSIS — Z79899 Other long term (current) drug therapy: Secondary | ICD-10-CM | POA: Diagnosis not present

## 2022-04-27 DIAGNOSIS — R29810 Facial weakness: Secondary | ICD-10-CM | POA: Diagnosis not present

## 2022-04-27 DIAGNOSIS — I6781 Acute cerebrovascular insufficiency: Secondary | ICD-10-CM | POA: Diagnosis not present

## 2022-04-27 DIAGNOSIS — R509 Fever, unspecified: Secondary | ICD-10-CM | POA: Diagnosis not present

## 2022-04-27 DIAGNOSIS — Z8673 Personal history of transient ischemic attack (TIA), and cerebral infarction without residual deficits: Secondary | ICD-10-CM | POA: Diagnosis not present

## 2022-04-27 DIAGNOSIS — I82432 Acute embolism and thrombosis of left popliteal vein: Secondary | ICD-10-CM | POA: Diagnosis not present

## 2022-04-27 DIAGNOSIS — I639 Cerebral infarction, unspecified: Secondary | ICD-10-CM | POA: Diagnosis not present

## 2022-04-27 DIAGNOSIS — F17210 Nicotine dependence, cigarettes, uncomplicated: Secondary | ICD-10-CM | POA: Diagnosis not present

## 2022-04-27 DIAGNOSIS — R29716 NIHSS score 16: Secondary | ICD-10-CM | POA: Diagnosis not present

## 2022-04-27 DIAGNOSIS — T17820A Food in other parts of respiratory tract causing asphyxiation, initial encounter: Secondary | ICD-10-CM | POA: Diagnosis not present

## 2022-04-27 DIAGNOSIS — Z7409 Other reduced mobility: Secondary | ICD-10-CM | POA: Diagnosis not present

## 2022-04-27 DIAGNOSIS — R569 Unspecified convulsions: Secondary | ICD-10-CM | POA: Diagnosis not present

## 2022-04-27 DIAGNOSIS — R059 Cough, unspecified: Secondary | ICD-10-CM | POA: Diagnosis not present

## 2022-04-27 DIAGNOSIS — R4182 Altered mental status, unspecified: Secondary | ICD-10-CM | POA: Diagnosis not present

## 2022-04-27 DIAGNOSIS — G8194 Hemiplegia, unspecified affecting left nondominant side: Secondary | ICD-10-CM | POA: Diagnosis not present

## 2022-04-27 DIAGNOSIS — I359 Nonrheumatic aortic valve disorder, unspecified: Secondary | ICD-10-CM | POA: Diagnosis not present

## 2022-04-27 DIAGNOSIS — K219 Gastro-esophageal reflux disease without esophagitis: Secondary | ICD-10-CM | POA: Diagnosis not present

## 2022-04-27 DIAGNOSIS — I700 Atherosclerosis of aorta: Secondary | ICD-10-CM | POA: Diagnosis not present

## 2022-04-27 DIAGNOSIS — R29818 Other symptoms and signs involving the nervous system: Secondary | ICD-10-CM | POA: Diagnosis not present

## 2022-04-27 DIAGNOSIS — R4789 Other speech disturbances: Secondary | ICD-10-CM | POA: Diagnosis not present

## 2022-04-27 DIAGNOSIS — F32A Depression, unspecified: Secondary | ICD-10-CM | POA: Diagnosis not present

## 2022-04-27 DIAGNOSIS — R4781 Slurred speech: Secondary | ICD-10-CM | POA: Diagnosis not present

## 2022-04-27 DIAGNOSIS — Q2546 Tortuous aortic arch: Secondary | ICD-10-CM | POA: Diagnosis not present

## 2022-04-27 DIAGNOSIS — I671 Cerebral aneurysm, nonruptured: Secondary | ICD-10-CM | POA: Diagnosis not present

## 2022-04-27 DIAGNOSIS — G319 Degenerative disease of nervous system, unspecified: Secondary | ICD-10-CM | POA: Diagnosis not present

## 2022-04-27 DIAGNOSIS — R1310 Dysphagia, unspecified: Secondary | ICD-10-CM | POA: Diagnosis not present

## 2022-04-27 DIAGNOSIS — I6782 Cerebral ischemia: Secondary | ICD-10-CM | POA: Diagnosis not present

## 2022-04-27 DIAGNOSIS — I10 Essential (primary) hypertension: Secondary | ICD-10-CM | POA: Diagnosis not present

## 2022-04-27 DIAGNOSIS — M62838 Other muscle spasm: Secondary | ICD-10-CM | POA: Diagnosis not present

## 2022-04-27 DIAGNOSIS — R4701 Aphasia: Secondary | ICD-10-CM | POA: Diagnosis not present

## 2022-04-27 DIAGNOSIS — R9082 White matter disease, unspecified: Secondary | ICD-10-CM | POA: Diagnosis not present

## 2022-04-27 DIAGNOSIS — I69351 Hemiplegia and hemiparesis following cerebral infarction affecting right dominant side: Secondary | ICD-10-CM | POA: Diagnosis not present

## 2022-04-27 DIAGNOSIS — A419 Sepsis, unspecified organism: Secondary | ICD-10-CM | POA: Diagnosis not present

## 2022-04-27 DIAGNOSIS — F419 Anxiety disorder, unspecified: Secondary | ICD-10-CM | POA: Diagnosis not present

## 2022-04-27 DIAGNOSIS — J439 Emphysema, unspecified: Secondary | ICD-10-CM | POA: Diagnosis not present

## 2022-04-27 DIAGNOSIS — R471 Dysarthria and anarthria: Secondary | ICD-10-CM | POA: Diagnosis not present

## 2022-04-27 DIAGNOSIS — I6381 Other cerebral infarction due to occlusion or stenosis of small artery: Secondary | ICD-10-CM | POA: Diagnosis not present

## 2022-04-27 DIAGNOSIS — G936 Cerebral edema: Secondary | ICD-10-CM | POA: Diagnosis not present

## 2022-04-27 DIAGNOSIS — N390 Urinary tract infection, site not specified: Secondary | ICD-10-CM | POA: Diagnosis not present

## 2022-04-27 DIAGNOSIS — J449 Chronic obstructive pulmonary disease, unspecified: Secondary | ICD-10-CM | POA: Diagnosis not present

## 2022-04-27 DIAGNOSIS — R0602 Shortness of breath: Secondary | ICD-10-CM | POA: Diagnosis not present

## 2022-04-27 LAB — COMPREHENSIVE METABOLIC PANEL
ALT: 10 U/L (ref 0–44)
AST: 20 U/L (ref 15–41)
Albumin: 3.7 g/dL (ref 3.5–5.0)
Alkaline Phosphatase: 103 U/L (ref 38–126)
Anion gap: 11 (ref 5–15)
BUN: 5 mg/dL — ABNORMAL LOW (ref 6–20)
CO2: 23 mmol/L (ref 22–32)
Calcium: 9.7 mg/dL (ref 8.9–10.3)
Chloride: 106 mmol/L (ref 98–111)
Creatinine, Ser: 0.92 mg/dL (ref 0.44–1.00)
GFR, Estimated: >60 mL/min (ref >60)
Glucose, Bld: 116 mg/dL — ABNORMAL HIGH (ref 70–99)
Potassium: 3.3 mmol/L — ABNORMAL LOW (ref 3.5–5.1)
Sodium: 140 mmol/L (ref 135–145)
Total Bilirubin: 0.8 mg/dL (ref 0.3–1.2)
Total Protein: 7.6 g/dL (ref 6.5–8.1)

## 2022-04-27 LAB — CBC WITH DIFFERENTIAL/PLATELET
Abs Immature Granulocytes: 0.12 10*3/uL — ABNORMAL HIGH (ref 0.00–0.07)
Basophils Absolute: 0 10*3/uL (ref 0.0–0.1)
Basophils Relative: 0 %
Eosinophils Absolute: 0.1 10*3/uL (ref 0.0–0.5)
Eosinophils Relative: 1 %
HCT: 41.5 % (ref 36.0–46.0)
Hemoglobin: 13.6 g/dL (ref 12.0–15.0)
Immature Granulocytes: 1 %
Lymphocytes Relative: 33 %
Lymphs Abs: 4.3 10*3/uL — ABNORMAL HIGH (ref 0.7–4.0)
MCH: 27.8 pg (ref 26.0–34.0)
MCHC: 32.8 g/dL (ref 30.0–36.0)
MCV: 84.7 fL (ref 80.0–100.0)
Monocytes Absolute: 0.8 10*3/uL (ref 0.1–1.0)
Monocytes Relative: 6 %
Neutro Abs: 7.7 10*3/uL (ref 1.7–7.7)
Neutrophils Relative %: 59 %
Platelets: 336 10*3/uL (ref 150–400)
RBC: 4.9 MIL/uL (ref 3.87–5.11)
RDW: 12.5 % (ref 11.5–15.5)
WBC: 13.1 10*3/uL — ABNORMAL HIGH (ref 4.0–10.5)
nRBC: 0 % (ref 0.0–0.2)

## 2022-04-27 LAB — LIPID PANEL
Cholesterol: 292 mg/dL — ABNORMAL HIGH (ref 0–200)
HDL: 53 mg/dL (ref >40)
LDL Cholesterol: 225 mg/dL — ABNORMAL HIGH (ref 0–99)
Total CHOL/HDL Ratio: 5.5 RATIO
Triglycerides: 68 mg/dL (ref <150)
VLDL: 14 mg/dL (ref 0–40)

## 2022-04-27 LAB — D-DIMER, QUANTITATIVE: D-Dimer, Quant: 0.77 ug/mL-FEU — ABNORMAL HIGH (ref 0.00–0.50)

## 2022-04-27 LAB — TROPONIN I (HIGH SENSITIVITY): Troponin I (High Sensitivity): 7 ng/L (ref <18)

## 2022-04-27 LAB — HIV ANTIBODY (ROUTINE TESTING W REFLEX): HIV Screen 4th Generation wRfx: NONREACTIVE

## 2022-04-27 LAB — TSH: TSH: 3.455 u[IU]/mL (ref 0.350–4.500)

## 2022-04-27 LAB — MAGNESIUM: Magnesium: 2.1 mg/dL (ref 1.7–2.4)

## 2022-04-27 LAB — SARS CORONAVIRUS 2 BY RT PCR: SARS Coronavirus 2 by RT PCR: NEGATIVE

## 2022-04-27 MED ORDER — LORAZEPAM 2 MG/ML IJ SOLN
1.0000 mg | Freq: Once | INTRAMUSCULAR | Status: AC
Start: 2022-04-27 — End: 2022-04-27
  Administered 2022-04-27: 1 mg via INTRAVENOUS
  Filled 2022-04-27: qty 1

## 2022-04-27 MED ORDER — IOHEXOL 350 MG/ML SOLN
100.0000 mL | Freq: Once | INTRAVENOUS | Status: AC | PRN
  Administered 2022-04-27: 100 mL via INTRAVENOUS

## 2022-04-27 MED ORDER — ATORVASTATIN CALCIUM 40 MG PO TABS: 80.0000 mg | ORAL_TABLET | Freq: Every day | ORAL | Status: DC

## 2022-04-27 NOTE — ED Notes (Signed)
Patient complained of bed being uncomfortable, hospital bed was requested for patient.

## 2022-04-27 NOTE — Hospital Course (Signed)
61 year old female with past medical history of COPD, hypertension, GERD, nicotine dependence, right PCA aneurysm (conservative management, Dx 2019), as well as history of left pontine stroke with chronic right lower extremity weakness presented to hospital with worsening right-sided weakness and difficulty with speech different than baseline.  EMS was called in and patient was brought into the hospital.  Patient was out of the tPA window.  Neurology was consulted.  MRI of the brain showed acute/subacute infarct involving the right posterior lentiform nucleus, internal capsule and corona radiata.  Patient was then admitted to hospital for further evaluation and treatment  Assessment and plan  Acute stroke due to ischemia [facility]) MRI evidence of stroke.  Continue aspirin Lipitor.  Lipid panel showing total cholesterol of 292 with LDL of 225.  Continue neurochecks, telemetry monitor.  TSH of 3.4.  Check hemoglobin A1c.  PT OT speech therapy.  Allow permissive hypertension at this time.  Follow neurology recommendations.     Hypertensive urgency Patient with non compliance to antihypertensive medication.  Permissive hypertension at this time.  Supposed to be on HCTZ and amlodipine as outpatient.    Chest pain Right-sided.  EKG with sinus tachycardia.  D-dimer 0.7.  Check 2D echocardiogram.  EKG with sinus tachycardia.  Chest x-ray was unremarkable.  COPD (chronic obstructive pulmonary disease) (HCC) Continue bronchodilators while in the hospital.  Supposed to be on albuterol as outpatient.   Nicotine dependence, cigarettes, uncomplicated Declined nicotine patch.   GERD (gastroesophageal reflux disease) Continue PPI.

## 2022-04-27 NOTE — ED Notes (Signed)
The pt just returned from c-t requesting a lock on her door and a covid swab

## 2022-04-27 NOTE — ED Notes (Signed)
The pt continues to make rude and angry remarks each time I attempt to give meds or tasks

## 2022-04-27 NOTE — ED Notes (Signed)
To ct

## 2022-04-27 NOTE — ED Notes (Signed)
Family called the ED 6 times cursing at staff. RN did not feel comfortable with visitors coming back to room as they were being verbally aggressive and abusive towards staff. Family decided to make pt leave AMA. Pt escorted out by security. MD notified.

## 2022-04-27 NOTE — Progress Notes (Signed)
STROKE TEAM PROGRESS NOTE   INTERVAL HISTORY No family at the bedside. She is laying in the bed in NAD. HR on monitor 120's. She is not real cooperative with exam.  Has had prior strokes in the past with right side deficits, no requirments of assitive devices. She is awake and alert, oriented x 4 with severe dysarthria and at times unable to comprehend. No facial droop or aphasia. Moves all extremities, right arm and right leg weaker 4/5. Sensation in tact. Slowed alternating rapid movements on the right.  She does not take any medications. She smokes 1ppd  Vitals:   04/27/22 0115 04/27/22 0635 04/27/22 0728 04/27/22 0830  BP: (!) 231/200 (!) 139/98  (!) 138/103  Pulse: (!) 117 (!) 106  (!) 115  Resp: (!) 24 (!) 22  (!) 25  Temp:   98.1 F (36.7 C)   TempSrc:   Oral   SpO2: 98% 99%  91%  Weight:      Height:       CBC:  Recent Labs  Lab 04/26/22 0932 04/27/22 0625  WBC 14.3* 13.1*  NEUTROABS  --  7.7  HGB 12.7 13.6  HCT 40.1 41.5  MCV 87.9 84.7  PLT 310 937   Basic Metabolic Panel:  Recent Labs  Lab 04/26/22 0932 04/27/22 0625  NA 142 140  K 3.6 3.3*  CL 111 106  CO2 24 23  GLUCOSE 100* 116*  BUN 10 5*  CREATININE 0.89 0.92  CALCIUM 9.1 9.7  MG  --  2.1   Lipid Panel:  Recent Labs  Lab 04/27/22 0625  CHOL 292*  TRIG 68  HDL 53  CHOLHDL 5.5  VLDL 14  LDLCALC 225*   HgbA1c: No results for input(s): "HGBA1C" in the last 168 hours. Urine Drug Screen:  Recent Labs  Lab 04/26/22 0922  LABOPIA NONE DETECTED  COCAINSCRNUR NONE DETECTED  LABBENZ NONE DETECTED  AMPHETMU NONE DETECTED  THCU NONE DETECTED  LABBARB NONE DETECTED    Alcohol Level No results for input(s): "ETH" in the last 168 hours.  IMAGING past 24 hours CT Angio Chest Pulmonary Embolism (PE) W or WO Contrast  Result Date: 04/27/2022 CLINICAL DATA:  Positive D-dimer; PE suspected EXAM: CT ANGIOGRAPHY CHEST WITH CONTRAST TECHNIQUE: Multidetector CT imaging of the chest was performed using  the standard protocol during bolus administration of intravenous contrast. Multiplanar CT image reconstructions and MIPs were obtained to evaluate the vascular anatomy. RADIATION DOSE REDUCTION: This exam was performed according to the departmental dose-optimization program which includes automated exposure control, adjustment of the mA and/or kV according to patient size and/or use of iterative reconstruction technique. CONTRAST:  116m OMNIPAQUE IOHEXOL 350 MG/ML SOLN COMPARISON:  Radiographs 04/26/2022 FINDINGS: Cardiovascular: Satisfactory opacification of the pulmonary arteries to the segmental level. No evidence of pulmonary embolism. Normal heart size. No pericardial effusion. Aortic and coronary artery atherosclerotic calcification. Mediastinum/Nodes: No enlarged mediastinal, hilar, or axillary lymph nodes. Thyroid gland, trachea, and esophagus demonstrate no significant findings. Lungs/Pleura: Mild emphysema greatest in the upper lobes. No focal consolidation, pleural effusion, or pneumothorax. Upper Abdomen: No acute abnormality. Musculoskeletal: No chest wall abnormality. No acute or significant osseous findings. Review of the MIP images confirms the above findings. IMPRESSION: Negative for acute pulmonary embolism. No acute findings in the chest. Aortic Atherosclerosis (ICD10-I70.0) and Emphysema (ICD10-J43.9). Electronically Signed   By: TPlacido SouM.D.   On: 04/27/2022 02:52   MR BRAIN WO CONTRAST  Result Date: 04/26/2022 CLINICAL DATA:  Weakness, history of  CVA EXAM: MRI HEAD WITHOUT CONTRAST MRA HEAD WITHOUT CONTRAST TECHNIQUE: Multiplanar, multi-echo pulse sequences of the brain and surrounding structures were acquired without intravenous contrast. Angiographic images of the Circle of Willis were acquired using MRA technique without intravenous contrast. COMPARISON:  MRI head 06/16/2018, MRA head 06/06/2018; correlation is also made with CTA head and neck 04/26/2022 FINDINGS: MRI HEAD  FINDINGS Brain: Faint restricted diffusion with ADC correlate in the posterosuperior right internal capsule/corona radiata and posterior lentiform nucleus (series 2, images 27-31). This area is associated with mildly increased T2 hyperintense signal. No acute hemorrhage, mass, mass effect, or midline shift. Focal encephalomalacia in the left thalamus and left pons correlate with the sequela of the acute infarcts seen on 06/16/2018. Additional lacunar infarct in the left caudate and external capsule. Confluent T2 hyperintense signal in the periventricular white matter and pons, likely the sequela of moderate chronic small vessel ischemic disease. No hydrocephalus or extra-axial collection. Vascular: Normal arterial flow voids. Skull and upper cervical spine: Normal marrow signal. Sinuses/Orbits: No acute finding. Other: Trace fluid in the right mastoid air cells. MRA HEAD FINDINGS Anterior circulation: Both internal carotid arteries are patent to the termini, without significant stenosis. Redemonstrated lobulated, laterally and anteriorly directed aneurysm from the right posterior communicating artery origin, which measures approximately 5 x 6 mm (series 3, image 80), grossly unchanged compared to 06/06/2018. The right posterior communicating artery arises from the aneurysm (series 3, image 77). A1 segments patent. Possible fenestrated anterior communicating artery. Anterior cerebral arteries are patent to their distal aspects. No M1 stenosis or occlusion. Evaluation of the anterior MCA branches is somewhat limited by motion. Within this limitation, distal MCA branches perfused and symmetric. Posterior circulation: Vertebral arteries patent to the vertebrobasilar junction without stenosis. Posterior inferior cerebral arteries patent bilaterally. Basilar patent to its distal aspect. Superior cerebellar arteries patent bilaterally. Patent left P 1. Absent right P1 with a fetal origin of the right PCA from the right  posterior communicating artery. PCAs perfused to their distal aspects without stenosis. The left posterior communicating artery is not definitively seen. Anatomic variants: Fetal origin of the right PCA. IMPRESSION: 1. Acute to subacute infarcts in the right posterior lentiform nucleus, internal capsule, and corona radiata. No mass effect or associated hemorrhage. 2. No intracranial large vessel occlusion or significant stenosis. 3. 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 The right posterior cerebral artery originates from the right posterior communicating artery. These results were called by telephone at the time of interpretation on 04/26/2022 at 7:14 pm to provider GRAY , who verbally acknowledged these results. Electronically Signed   By: Merilyn Baba M.D.   On: 04/26/2022 19:15   MR ANGIO HEAD WO CONTRAST  Result Date: 04/26/2022 CLINICAL DATA:  Weakness, history of CVA EXAM: MRI HEAD WITHOUT CONTRAST MRA HEAD WITHOUT CONTRAST TECHNIQUE: Multiplanar, multi-echo pulse sequences of the brain and surrounding structures were acquired without intravenous contrast. Angiographic images of the Circle of Willis were acquired using MRA technique without intravenous contrast. COMPARISON:  MRI head 06/16/2018, MRA head 06/06/2018; correlation is also made with CTA head and neck 04/26/2022 FINDINGS: MRI HEAD FINDINGS Brain: Faint restricted diffusion with ADC correlate in the posterosuperior right internal capsule/corona radiata and posterior lentiform nucleus (series 2, images 27-31). This area is associated with mildly increased T2 hyperintense signal. No acute hemorrhage, mass, mass effect, or midline shift. Focal encephalomalacia in the left thalamus and left pons correlate with the sequela of  the acute infarcts seen on 06/16/2018. Additional lacunar infarct in the left caudate and external capsule. Confluent T2 hyperintense signal in the  periventricular white matter and pons, likely the sequela of moderate chronic small vessel ischemic disease. No hydrocephalus or extra-axial collection. Vascular: Normal arterial flow voids. Skull and upper cervical spine: Normal marrow signal. Sinuses/Orbits: No acute finding. Other: Trace fluid in the right mastoid air cells. MRA HEAD FINDINGS Anterior circulation: Both internal carotid arteries are patent to the termini, without significant stenosis. Redemonstrated lobulated, laterally and anteriorly directed aneurysm from the right posterior communicating artery origin, which measures approximately 5 x 6 mm (series 3, image 80), grossly unchanged compared to 06/06/2018. The right posterior communicating artery arises from the aneurysm (series 3, image 77). A1 segments patent. Possible fenestrated anterior communicating artery. Anterior cerebral arteries are patent to their distal aspects. No M1 stenosis or occlusion. Evaluation of the anterior MCA branches is somewhat limited by motion. Within this limitation, distal MCA branches perfused and symmetric. Posterior circulation: Vertebral arteries patent to the vertebrobasilar junction without stenosis. Posterior inferior cerebral arteries patent bilaterally. Basilar patent to its distal aspect. Superior cerebellar arteries patent bilaterally. Patent left P 1. Absent right P1 with a fetal origin of the right PCA from the right posterior communicating artery. PCAs perfused to their distal aspects without stenosis. The left posterior communicating artery is not definitively seen. Anatomic variants: Fetal origin of the right PCA. IMPRESSION: 1. Acute to subacute infarcts in the right posterior lentiform nucleus, internal capsule, and corona radiata. No mass effect or associated hemorrhage. 2. No intracranial large vessel occlusion or significant stenosis. 3. 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 The right posterior cerebral artery originates from the right posterior communicating artery. These results were called by telephone at the time of interpretation on 04/26/2022 at 7:14 pm to provider GRAY , who verbally acknowledged these results. Electronically Signed   By: Merilyn Baba M.D.   On: 04/26/2022 19:15   CT ANGIO HEAD NECK W WO CM  Result Date: 04/26/2022 CLINICAL DATA:  Right-sided weakness that began this morning EXAM: CT ANGIOGRAPHY HEAD AND NECK TECHNIQUE: Multidetector CT imaging of the head and neck was performed using the standard protocol during bolus administration of intravenous contrast. Multiplanar CT image reconstructions and MIPs were obtained to evaluate the vascular anatomy. Carotid stenosis measurements (when applicable) are obtained utilizing NASCET criteria, using the distal internal carotid diameter as the denominator. RADIATION DOSE REDUCTION: This exam was performed according to the departmental dose-optimization program which includes automated exposure control, adjustment of the mA and/or kV according to patient size and/or use of iterative reconstruction technique. CONTRAST:  136m OMNIPAQUE IOHEXOL 350 MG/ML SOLN COMPARISON:  Brain MRI 06/16/2018. FINDINGS: CT HEAD FINDINGS Brain: Chronic small vessel infarcts at the left pons, left thalamus, and left caudate. No evidence of acute infarct. No hemorrhage, hydrocephalus, or collection. Vascular: See below Skull: No acute finding Sinuses/Orbits: No acute finding Review of the MIP images confirms the above findings CTA NECK FINDINGS Aortic arch: Atheromatous plaque. No acute finding. Three vessel branching Right carotid system: Relatively mild mixed density plaque at the bifurcation without flow limiting stenosis or ulceration Left carotid system: Relatively mild, low-density atheromatous plaque at the distal common carotid and bifurcation without flow reducing stenosis or ulceration. Vertebral arteries: Calcified plaque  at the left subclavian origin without flow reducing stenosis. The vertebral arteries are smoothly contoured and widely patent to the dura.  Skeleton: Generalized cervical spine degeneration. Other neck: No acute finding Upper chest: Biapical emphysema. Review of the MIP images confirms the above findings CTA HEAD FINDINGS Anterior circulation: Atheromatous calcification of the carotid siphons. No branch occlusion, beading, or flow limiting stenosis affecting proximal vessels. Known aneurysm at the right proximal posterior communicating artery which serves the fetal type right PCA, aneurysm measuring 5.5 mm with mild anterior and inferior lobulation, size and appearance unchanged from prior MRA. Posterior circulation: The vertebral and basilar arteries are smoothly contoured and widely patent. No branch occlusion, beading, or aneurysm Venous sinuses: Diffusely patent Anatomic variants: None significant Review of the MIP images confirms the above findings IMPRESSION: 1. No emergent finding. 2. Atherosclerosis without flow limiting stenosis or occlusion of major arteries in the head and neck. 3. Multiple chronic lacunar infarcts. 4. 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 Electronically Signed   By: Jorje Guild M.D.   On: 04/26/2022 10:45   DG Chest 1 View  Result Date: 04/26/2022 CLINICAL DATA:  Weakness EXAM: CHEST  1 VIEW COMPARISON:  08/01/2015 FINDINGS: The heart size and mediastinal contours are within normal limits. Both lungs are clear. The visualized skeletal structures are unremarkable. IMPRESSION: No active disease. Electronically Signed   By: Davina Poke D.O.   On: 04/26/2022 10:27    PHYSICAL EXAM  Temp:  [98.1 F (36.7 C)-99.3 F (37.4 C)] 98.1 F (36.7 C) (09/10 0728) Pulse Rate:  [77-127] 111 (09/10 1000) Resp:  [16-34] 28 (09/10 1000) BP: (116-231)/(90-200) 148/102 (09/10 1000) SpO2:  [91 %-100 %] 99 % (09/10 1000)  General  - Well nourished, well developed, in no apparent distress Cardiovascular - Regular rhythm and rate.  She is awake and alert, oriented x 4 with severe dysarthria and at times unable to comprehend. No facial droop or aphasia. Moves all extremities, right arm and right leg weaker 4/5. Sensation in tact. Slowed alternating rapid movements on the right.   ASSESSMENT/PLAN [name] is a 61 y.o. female with history of stroke in 2019 with residual right sided weakness and dysarthria, anxiety, arthritis, asthma, chronic pain, COPD, depression and HTN who presented to the ED this AM with worsening of her chronic right sided weakness. LKN was at about 2 AM when she woke up to use the bathroom.   Stroke:  Acute/ subacute right posterior lentiform nucleus, internal capsule and corona radiata ischemic infarct  Etiology:  small vessel disease,   CTA head & neck  1. No emergent finding. 2. Atherosclerosis without flow limiting stenosis or occlusion of major arteries in the head and neck. 3. Multiple chronic lacunar infarcts. 4. 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 Acute to subacute infarcts in the right posterior lentiform nucleus, internal capsule, and corona radiata. No mass effect or associated hemorrhage. 2. No intracranial large vessel occlusion or significant stenosis. 3. 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 The right posterior cerebral artery originates from the right posterior communicating artery. 2D Echo pending  LDL 225 HgbA1c No results found for requested labs within last 1095 days. VTE prophylaxis - SCD's    Diet   Diet NPO time specified Except for: Ice Chips, Sips with Meds   No antithrombotic prior to admission, now on aspirin 81 mg daily.  Therapy recommendations:  pending Disposition:   pending  Hypertension Home meds:  norvasc,  hctz-non compliant UnStable Permissive hypertension (OK if < 220/120) but gradually normalize in 5-7 days Long-term BP goal normotensive  Hyperlipidemia Home meds:  none,  LDL 225, goal < 70 Add atorvastatin '80mg'$   Continue statin at discharge  Diabetes type II UnControlled Home meds:  none HgbA1c No results found for requested labs within last 1095 days., goal < 7.0 CBGs Recent Labs    04/26/22 0912  GLUCAP 103*    SSI  Other Stroke Risk Factors  Cigarette smoker advised to stop smoking  Hx stroke/TIA    Hospital day # 1  Beulah Gandy DNP, ACNPC-AG  To contact Stroke Continuity provider, please refer to [email or web address] After hours, contact General Neurology

## 2022-04-27 NOTE — ED Notes (Signed)
The pt  has agreed momentarily to do the c-t  but a trauma has delayed  the c-t for now

## 2022-04-27 NOTE — ED Notes (Signed)
Pt is requesting a  covid test  she reports that she requested it earlier today and never received one

## 2022-05-05 DIAGNOSIS — Z91148 Patient's other noncompliance with medication regimen for other reason: Secondary | ICD-10-CM | POA: Diagnosis not present

## 2022-05-05 DIAGNOSIS — G8114 Spastic hemiplegia affecting left nondominant side: Secondary | ICD-10-CM | POA: Diagnosis not present

## 2022-05-05 DIAGNOSIS — B3749 Other urogenital candidiasis: Secondary | ICD-10-CM | POA: Diagnosis not present

## 2022-05-05 DIAGNOSIS — I69354 Hemiplegia and hemiparesis following cerebral infarction affecting left non-dominant side: Secondary | ICD-10-CM | POA: Diagnosis not present

## 2022-05-05 DIAGNOSIS — E785 Hyperlipidemia, unspecified: Secondary | ICD-10-CM | POA: Diagnosis not present

## 2022-05-05 DIAGNOSIS — I69352 Hemiplegia and hemiparesis following cerebral infarction affecting left dominant side: Secondary | ICD-10-CM | POA: Diagnosis not present

## 2022-05-05 DIAGNOSIS — Z8673 Personal history of transient ischemic attack (TIA), and cerebral infarction without residual deficits: Secondary | ICD-10-CM | POA: Diagnosis not present

## 2022-05-05 DIAGNOSIS — I82402 Acute embolism and thrombosis of unspecified deep veins of left lower extremity: Secondary | ICD-10-CM | POA: Diagnosis not present

## 2022-05-05 DIAGNOSIS — I69922 Dysarthria following unspecified cerebrovascular disease: Secondary | ICD-10-CM | POA: Diagnosis not present

## 2022-05-05 DIAGNOSIS — I69322 Dysarthria following cerebral infarction: Secondary | ICD-10-CM | POA: Diagnosis not present

## 2022-05-05 DIAGNOSIS — J690 Pneumonitis due to inhalation of food and vomit: Secondary | ICD-10-CM | POA: Diagnosis not present

## 2022-05-05 DIAGNOSIS — R2689 Other abnormalities of gait and mobility: Secondary | ICD-10-CM | POA: Diagnosis not present

## 2022-05-05 DIAGNOSIS — R600 Localized edema: Secondary | ICD-10-CM | POA: Diagnosis not present

## 2022-05-05 DIAGNOSIS — I10 Essential (primary) hypertension: Secondary | ICD-10-CM | POA: Diagnosis not present

## 2022-05-05 DIAGNOSIS — R1310 Dysphagia, unspecified: Secondary | ICD-10-CM | POA: Diagnosis not present

## 2022-05-05 DIAGNOSIS — R630 Anorexia: Secondary | ICD-10-CM | POA: Diagnosis not present

## 2022-05-05 DIAGNOSIS — R051 Acute cough: Secondary | ICD-10-CM | POA: Diagnosis not present

## 2022-05-05 DIAGNOSIS — D72829 Elevated white blood cell count, unspecified: Secondary | ICD-10-CM | POA: Diagnosis not present

## 2022-05-05 DIAGNOSIS — I639 Cerebral infarction, unspecified: Secondary | ICD-10-CM | POA: Diagnosis not present

## 2022-05-05 DIAGNOSIS — A419 Sepsis, unspecified organism: Secondary | ICD-10-CM | POA: Diagnosis not present

## 2022-05-05 DIAGNOSIS — K219 Gastro-esophageal reflux disease without esophagitis: Secondary | ICD-10-CM | POA: Diagnosis not present

## 2022-05-05 DIAGNOSIS — I671 Cerebral aneurysm, nonruptured: Secondary | ICD-10-CM | POA: Diagnosis not present

## 2022-05-05 DIAGNOSIS — R4701 Aphasia: Secondary | ICD-10-CM | POA: Diagnosis not present

## 2022-05-05 DIAGNOSIS — E876 Hypokalemia: Secondary | ICD-10-CM | POA: Diagnosis not present

## 2022-05-05 DIAGNOSIS — G8110 Spastic hemiplegia affecting unspecified side: Secondary | ICD-10-CM | POA: Diagnosis not present

## 2022-05-05 DIAGNOSIS — R059 Cough, unspecified: Secondary | ICD-10-CM | POA: Diagnosis not present

## 2022-05-05 DIAGNOSIS — Z743 Need for continuous supervision: Secondary | ICD-10-CM | POA: Diagnosis not present

## 2022-05-05 DIAGNOSIS — I82432 Acute embolism and thrombosis of left popliteal vein: Secondary | ICD-10-CM | POA: Diagnosis not present

## 2022-05-05 DIAGNOSIS — R000 Tachycardia, unspecified: Secondary | ICD-10-CM | POA: Diagnosis not present

## 2022-05-05 DIAGNOSIS — I69320 Aphasia following cerebral infarction: Secondary | ICD-10-CM | POA: Diagnosis not present

## 2022-05-05 DIAGNOSIS — M6289 Other specified disorders of muscle: Secondary | ICD-10-CM | POA: Diagnosis not present

## 2022-05-05 DIAGNOSIS — K5900 Constipation, unspecified: Secondary | ICD-10-CM | POA: Diagnosis not present

## 2022-05-05 DIAGNOSIS — F32A Depression, unspecified: Secondary | ICD-10-CM | POA: Diagnosis not present

## 2022-05-05 DIAGNOSIS — Z7409 Other reduced mobility: Secondary | ICD-10-CM | POA: Diagnosis not present

## 2022-05-05 DIAGNOSIS — G459 Transient cerebral ischemic attack, unspecified: Secondary | ICD-10-CM | POA: Diagnosis not present

## 2022-05-05 DIAGNOSIS — T17320A Food in larynx causing asphyxiation, initial encounter: Secondary | ICD-10-CM | POA: Diagnosis not present

## 2022-05-05 DIAGNOSIS — I6381 Other cerebral infarction due to occlusion or stenosis of small artery: Secondary | ICD-10-CM | POA: Diagnosis not present

## 2022-05-05 DIAGNOSIS — I69391 Dysphagia following cerebral infarction: Secondary | ICD-10-CM | POA: Diagnosis not present

## 2022-05-05 DIAGNOSIS — R471 Dysarthria and anarthria: Secondary | ICD-10-CM | POA: Diagnosis not present

## 2022-05-06 DIAGNOSIS — I6381 Other cerebral infarction due to occlusion or stenosis of small artery: Secondary | ICD-10-CM | POA: Diagnosis not present

## 2022-05-06 DIAGNOSIS — I82402 Acute embolism and thrombosis of unspecified deep veins of left lower extremity: Secondary | ICD-10-CM | POA: Diagnosis not present

## 2022-05-06 DIAGNOSIS — Z7409 Other reduced mobility: Secondary | ICD-10-CM | POA: Diagnosis not present

## 2022-05-06 DIAGNOSIS — I10 Essential (primary) hypertension: Secondary | ICD-10-CM | POA: Diagnosis not present

## 2022-05-06 DIAGNOSIS — R471 Dysarthria and anarthria: Secondary | ICD-10-CM | POA: Diagnosis not present

## 2022-05-06 DIAGNOSIS — G8114 Spastic hemiplegia affecting left nondominant side: Secondary | ICD-10-CM | POA: Diagnosis not present

## 2022-05-06 DIAGNOSIS — Z8673 Personal history of transient ischemic attack (TIA), and cerebral infarction without residual deficits: Secondary | ICD-10-CM | POA: Diagnosis not present

## 2022-05-07 DIAGNOSIS — G8114 Spastic hemiplegia affecting left nondominant side: Secondary | ICD-10-CM | POA: Diagnosis not present

## 2022-05-07 DIAGNOSIS — I82402 Acute embolism and thrombosis of unspecified deep veins of left lower extremity: Secondary | ICD-10-CM | POA: Diagnosis not present

## 2022-05-07 DIAGNOSIS — I6381 Other cerebral infarction due to occlusion or stenosis of small artery: Secondary | ICD-10-CM | POA: Diagnosis not present

## 2022-05-07 DIAGNOSIS — Z7409 Other reduced mobility: Secondary | ICD-10-CM | POA: Diagnosis not present

## 2022-05-07 DIAGNOSIS — R471 Dysarthria and anarthria: Secondary | ICD-10-CM | POA: Diagnosis not present

## 2022-05-08 DIAGNOSIS — T17320A Food in larynx causing asphyxiation, initial encounter: Secondary | ICD-10-CM | POA: Diagnosis not present

## 2022-05-08 DIAGNOSIS — I6381 Other cerebral infarction due to occlusion or stenosis of small artery: Secondary | ICD-10-CM | POA: Diagnosis not present

## 2022-05-08 DIAGNOSIS — R471 Dysarthria and anarthria: Secondary | ICD-10-CM | POA: Diagnosis not present

## 2022-05-08 DIAGNOSIS — Z7409 Other reduced mobility: Secondary | ICD-10-CM | POA: Diagnosis not present

## 2022-05-08 DIAGNOSIS — G8114 Spastic hemiplegia affecting left nondominant side: Secondary | ICD-10-CM | POA: Diagnosis not present

## 2022-05-08 DIAGNOSIS — R059 Cough, unspecified: Secondary | ICD-10-CM | POA: Diagnosis not present

## 2022-05-08 DIAGNOSIS — R1310 Dysphagia, unspecified: Secondary | ICD-10-CM | POA: Diagnosis not present

## 2022-05-08 DIAGNOSIS — I82402 Acute embolism and thrombosis of unspecified deep veins of left lower extremity: Secondary | ICD-10-CM | POA: Diagnosis not present

## 2022-05-09 DIAGNOSIS — R4701 Aphasia: Secondary | ICD-10-CM | POA: Diagnosis not present

## 2022-05-09 DIAGNOSIS — I10 Essential (primary) hypertension: Secondary | ICD-10-CM | POA: Diagnosis not present

## 2022-05-09 DIAGNOSIS — G8110 Spastic hemiplegia affecting unspecified side: Secondary | ICD-10-CM | POA: Diagnosis not present

## 2022-05-09 DIAGNOSIS — Z8673 Personal history of transient ischemic attack (TIA), and cerebral infarction without residual deficits: Secondary | ICD-10-CM | POA: Diagnosis not present

## 2022-05-09 DIAGNOSIS — R2689 Other abnormalities of gait and mobility: Secondary | ICD-10-CM | POA: Diagnosis not present

## 2022-05-10 DIAGNOSIS — R471 Dysarthria and anarthria: Secondary | ICD-10-CM | POA: Diagnosis not present

## 2022-05-10 DIAGNOSIS — I82402 Acute embolism and thrombosis of unspecified deep veins of left lower extremity: Secondary | ICD-10-CM | POA: Diagnosis not present

## 2022-05-10 DIAGNOSIS — G8114 Spastic hemiplegia affecting left nondominant side: Secondary | ICD-10-CM | POA: Diagnosis not present

## 2022-05-10 DIAGNOSIS — I6381 Other cerebral infarction due to occlusion or stenosis of small artery: Secondary | ICD-10-CM | POA: Diagnosis not present

## 2022-05-10 DIAGNOSIS — Z7409 Other reduced mobility: Secondary | ICD-10-CM | POA: Diagnosis not present

## 2022-05-11 DIAGNOSIS — I82402 Acute embolism and thrombosis of unspecified deep veins of left lower extremity: Secondary | ICD-10-CM | POA: Diagnosis not present

## 2022-05-11 DIAGNOSIS — Z7409 Other reduced mobility: Secondary | ICD-10-CM | POA: Diagnosis not present

## 2022-05-11 DIAGNOSIS — R471 Dysarthria and anarthria: Secondary | ICD-10-CM | POA: Diagnosis not present

## 2022-05-11 DIAGNOSIS — I6381 Other cerebral infarction due to occlusion or stenosis of small artery: Secondary | ICD-10-CM | POA: Diagnosis not present

## 2022-05-11 DIAGNOSIS — G8114 Spastic hemiplegia affecting left nondominant side: Secondary | ICD-10-CM | POA: Diagnosis not present

## 2022-05-11 DIAGNOSIS — Z8673 Personal history of transient ischemic attack (TIA), and cerebral infarction without residual deficits: Secondary | ICD-10-CM | POA: Diagnosis not present

## 2022-05-11 DIAGNOSIS — I10 Essential (primary) hypertension: Secondary | ICD-10-CM | POA: Diagnosis not present

## 2022-05-12 DIAGNOSIS — R471 Dysarthria and anarthria: Secondary | ICD-10-CM | POA: Diagnosis not present

## 2022-05-12 DIAGNOSIS — G8114 Spastic hemiplegia affecting left nondominant side: Secondary | ICD-10-CM | POA: Diagnosis not present

## 2022-05-12 DIAGNOSIS — I6381 Other cerebral infarction due to occlusion or stenosis of small artery: Secondary | ICD-10-CM | POA: Diagnosis not present

## 2022-05-12 DIAGNOSIS — Z7409 Other reduced mobility: Secondary | ICD-10-CM | POA: Diagnosis not present

## 2022-05-12 DIAGNOSIS — I82402 Acute embolism and thrombosis of unspecified deep veins of left lower extremity: Secondary | ICD-10-CM | POA: Diagnosis not present

## 2022-05-13 DIAGNOSIS — I10 Essential (primary) hypertension: Secondary | ICD-10-CM | POA: Diagnosis not present

## 2022-05-13 DIAGNOSIS — R051 Acute cough: Secondary | ICD-10-CM | POA: Diagnosis not present

## 2022-05-13 DIAGNOSIS — R471 Dysarthria and anarthria: Secondary | ICD-10-CM | POA: Diagnosis not present

## 2022-05-13 DIAGNOSIS — I82402 Acute embolism and thrombosis of unspecified deep veins of left lower extremity: Secondary | ICD-10-CM | POA: Diagnosis not present

## 2022-05-13 DIAGNOSIS — J690 Pneumonitis due to inhalation of food and vomit: Secondary | ICD-10-CM | POA: Diagnosis not present

## 2022-05-13 DIAGNOSIS — G8114 Spastic hemiplegia affecting left nondominant side: Secondary | ICD-10-CM | POA: Diagnosis not present

## 2022-05-13 DIAGNOSIS — Z8673 Personal history of transient ischemic attack (TIA), and cerebral infarction without residual deficits: Secondary | ICD-10-CM | POA: Diagnosis not present

## 2022-05-13 DIAGNOSIS — I6381 Other cerebral infarction due to occlusion or stenosis of small artery: Secondary | ICD-10-CM | POA: Diagnosis not present

## 2022-05-13 DIAGNOSIS — Z7409 Other reduced mobility: Secondary | ICD-10-CM | POA: Diagnosis not present

## 2022-05-14 DIAGNOSIS — R471 Dysarthria and anarthria: Secondary | ICD-10-CM | POA: Diagnosis not present

## 2022-05-14 DIAGNOSIS — J690 Pneumonitis due to inhalation of food and vomit: Secondary | ICD-10-CM | POA: Diagnosis not present

## 2022-05-14 DIAGNOSIS — I82402 Acute embolism and thrombosis of unspecified deep veins of left lower extremity: Secondary | ICD-10-CM | POA: Diagnosis not present

## 2022-05-14 DIAGNOSIS — I6381 Other cerebral infarction due to occlusion or stenosis of small artery: Secondary | ICD-10-CM | POA: Diagnosis not present

## 2022-05-14 DIAGNOSIS — Z8673 Personal history of transient ischemic attack (TIA), and cerebral infarction without residual deficits: Secondary | ICD-10-CM | POA: Diagnosis not present

## 2022-05-14 DIAGNOSIS — G8114 Spastic hemiplegia affecting left nondominant side: Secondary | ICD-10-CM | POA: Diagnosis not present

## 2022-05-14 DIAGNOSIS — I10 Essential (primary) hypertension: Secondary | ICD-10-CM | POA: Diagnosis not present

## 2022-05-14 DIAGNOSIS — Z7409 Other reduced mobility: Secondary | ICD-10-CM | POA: Diagnosis not present

## 2022-05-15 DIAGNOSIS — G8114 Spastic hemiplegia affecting left nondominant side: Secondary | ICD-10-CM | POA: Diagnosis not present

## 2022-05-15 DIAGNOSIS — I82402 Acute embolism and thrombosis of unspecified deep veins of left lower extremity: Secondary | ICD-10-CM | POA: Diagnosis not present

## 2022-05-15 DIAGNOSIS — Z7409 Other reduced mobility: Secondary | ICD-10-CM | POA: Diagnosis not present

## 2022-05-15 DIAGNOSIS — I6381 Other cerebral infarction due to occlusion or stenosis of small artery: Secondary | ICD-10-CM | POA: Diagnosis not present

## 2022-05-15 DIAGNOSIS — R471 Dysarthria and anarthria: Secondary | ICD-10-CM | POA: Diagnosis not present

## 2022-05-16 DIAGNOSIS — G8114 Spastic hemiplegia affecting left nondominant side: Secondary | ICD-10-CM | POA: Diagnosis not present

## 2022-05-16 DIAGNOSIS — R471 Dysarthria and anarthria: Secondary | ICD-10-CM | POA: Diagnosis not present

## 2022-05-16 DIAGNOSIS — I82402 Acute embolism and thrombosis of unspecified deep veins of left lower extremity: Secondary | ICD-10-CM | POA: Diagnosis not present

## 2022-05-16 DIAGNOSIS — I10 Essential (primary) hypertension: Secondary | ICD-10-CM | POA: Diagnosis not present

## 2022-05-16 DIAGNOSIS — Z8673 Personal history of transient ischemic attack (TIA), and cerebral infarction without residual deficits: Secondary | ICD-10-CM | POA: Diagnosis not present

## 2022-05-16 DIAGNOSIS — I6381 Other cerebral infarction due to occlusion or stenosis of small artery: Secondary | ICD-10-CM | POA: Diagnosis not present

## 2022-05-16 DIAGNOSIS — Z7409 Other reduced mobility: Secondary | ICD-10-CM | POA: Diagnosis not present

## 2022-05-16 DIAGNOSIS — J690 Pneumonitis due to inhalation of food and vomit: Secondary | ICD-10-CM | POA: Diagnosis not present

## 2022-05-17 DIAGNOSIS — Z7409 Other reduced mobility: Secondary | ICD-10-CM | POA: Diagnosis not present

## 2022-05-17 DIAGNOSIS — R471 Dysarthria and anarthria: Secondary | ICD-10-CM | POA: Diagnosis not present

## 2022-05-17 DIAGNOSIS — I6381 Other cerebral infarction due to occlusion or stenosis of small artery: Secondary | ICD-10-CM | POA: Diagnosis not present

## 2022-05-17 DIAGNOSIS — G8114 Spastic hemiplegia affecting left nondominant side: Secondary | ICD-10-CM | POA: Diagnosis not present

## 2022-05-17 DIAGNOSIS — I82402 Acute embolism and thrombosis of unspecified deep veins of left lower extremity: Secondary | ICD-10-CM | POA: Diagnosis not present

## 2022-05-18 DIAGNOSIS — I6381 Other cerebral infarction due to occlusion or stenosis of small artery: Secondary | ICD-10-CM | POA: Diagnosis not present

## 2022-05-18 DIAGNOSIS — I69391 Dysphagia following cerebral infarction: Secondary | ICD-10-CM | POA: Diagnosis not present

## 2022-05-18 DIAGNOSIS — I82402 Acute embolism and thrombosis of unspecified deep veins of left lower extremity: Secondary | ICD-10-CM | POA: Diagnosis not present

## 2022-05-18 DIAGNOSIS — I69922 Dysarthria following unspecified cerebrovascular disease: Secondary | ICD-10-CM | POA: Diagnosis not present

## 2022-05-18 DIAGNOSIS — I69352 Hemiplegia and hemiparesis following cerebral infarction affecting left dominant side: Secondary | ICD-10-CM | POA: Diagnosis not present

## 2022-05-18 DIAGNOSIS — R471 Dysarthria and anarthria: Secondary | ICD-10-CM | POA: Diagnosis not present

## 2022-05-18 DIAGNOSIS — Z7409 Other reduced mobility: Secondary | ICD-10-CM | POA: Diagnosis not present

## 2022-05-18 DIAGNOSIS — I10 Essential (primary) hypertension: Secondary | ICD-10-CM | POA: Diagnosis not present

## 2022-05-18 DIAGNOSIS — G8114 Spastic hemiplegia affecting left nondominant side: Secondary | ICD-10-CM | POA: Diagnosis not present

## 2022-06-24 ENCOUNTER — Other Ambulatory Visit (HOSPITAL_COMMUNITY): Payer: Self-pay | Admitting: Specialist

## 2022-06-24 DIAGNOSIS — R1312 Dysphagia, oropharyngeal phase: Secondary | ICD-10-CM

## 2022-07-07 ENCOUNTER — Other Ambulatory Visit (HOSPITAL_COMMUNITY): Payer: Self-pay | Admitting: Nurse Practitioner

## 2022-07-07 ENCOUNTER — Encounter (HOSPITAL_COMMUNITY): Payer: Self-pay | Admitting: Speech Pathology

## 2022-07-07 ENCOUNTER — Ambulatory Visit (HOSPITAL_COMMUNITY): Payer: Medicare HMO | Attending: Nurse Practitioner | Admitting: Speech Pathology

## 2022-07-07 ENCOUNTER — Ambulatory Visit (HOSPITAL_COMMUNITY)
Admission: RE | Admit: 2022-07-07 | Discharge: 2022-07-07 | Disposition: A | Discharge status: Home / Self Care | Payer: Medicare HMO | Source: Ambulatory Visit | Attending: Nurse Practitioner | Admitting: Nurse Practitioner

## 2022-07-07 DIAGNOSIS — R1312 Dysphagia, oropharyngeal phase: Secondary | ICD-10-CM | POA: Insufficient documentation

## 2022-07-07 NOTE — Therapy (Signed)
[place], [place], [address] Phone: [phone]   Fax:  [phone]  Modified Barium Swallow  Patient Details  Name: Judy Boyd MRN: [id number] Date of Birth: 10/14/1960 No data recorded  Encounter Date: 07/07/2022   End of Session - 07/07/22 1548     Visit Number 1    Number of Visits 1    Authorization Type [employer]    SLP Start Time 1140    SLP Stop Time  1207    SLP Time Calculation (min) 27 min    Activity Tolerance Patient tolerated treatment well             Past Medical History:  Diagnosis Date   Anxiety    Arthritis    Asthma    Bowel obstruction (HCC)    Chronic pain    COPD (chronic obstructive pulmonary disease) ([place])    Depression    GERD (gastroesophageal reflux disease)    Hypertension    Stroke [facility])     Past Surgical History:  Procedure Laterality Date   ABDOMINAL SURGERY  2003   blockage   left salpingectomy     uterine ablation      There were no vitals filed for this visit.     General - 07/07/22 1541       General Information   Date of Onset 05/01/22    HPI Judy Boyd is a 61 year old female history of left CVA with residual right-sided weakness in 2019 Admitted to outside hospital September 2023 with new right BG stroke with left sided weakness and dysarthria Dysphagia due to stroke. Her last MBSS was 05/19/2022 with recommendation for D3 and thin liquids with use of a Provale cup or NTL via straw sips. She was referred by Glory Buff, NP for repeat MBSS.   Type of Study MBS-Modified Barium Swallow Study    Previous Swallow Assessment 05/08/22 D2/NTL, 05/19/22 D3/thin via Provale cup or NTL via straw    Diet Prior to this Study Dysphagia 3 (soft);Thin liquids    Temperature Spikes Noted No    Respiratory Status Room air    History of Recent Intubation No    Behavior/Cognition Alert;Cooperative;Pleasant mood    Oral Cavity Assessment Within  Functional Limits    Oral Care Completed by SLP No    Oral Cavity - Dentition Adequate natural dentition    Vision Functional for self feeding    Self-Feeding Abilities Able to feed self    Patient Positioning Upright in chair    Baseline Vocal Quality Normal;Low vocal intensity    Volitional Cough Weak    Volitional Swallow Able to elicit    Anatomy Within functional limits    Pharyngeal Secretions Not observed secondary MBS            Previous Swallow Assess: <<12/23 MBSS moderate oropharyngeal dysphagia, 05/08/2022: minced and moist solids, honey thick liquids, and MBSS 05/19/2022: Recommend: thin liquids by bolus control cup (Provale 5 mL cup), nectar thick liquids by straw if bolus control cup is not available, minced and moist solids, alternate solids/liquids, meds in puree, ongoing intensive dysphagia treatment, repeat MBSS in 4-6 weeks to assess for progress and upgrade>>    Oral Preparation/Oral Phase - 07/07/22 1543       Oral Preparation/Oral Phase   Oral Phase Impaired      Oral - Thin   Oral - Thin Teaspoon Within functional limits    Oral - Thin  Cup Within functional limits    Oral - Thin Straw Within functional limits      Oral - Solids   Oral - Puree Within functional limits    Oral - Regular Weak ligual manipulation;Imparied mastication;Delayed A-P transit;Oral residue;Piecemeal swallowing    Oral - Pill Within functional limits      Electrical stimulation - Oral Phase   Was Electrical Stimulation Used No              Pharyngeal Phase - 07/07/22 1544       Pharyngeal Phase   Pharyngeal Phase Impaired      Pharyngeal - Thin   Pharyngeal- Thin Teaspoon Delayed swallow initiation;Swallow initiation at pyriform sinus;Penetration/Aspiration during swallow;Reduced airway/laryngeal closure    Pharyngeal Material enters airway, remains ABOVE vocal cords then ejected out    Pharyngeal- Thin Cup Delayed swallow initiation;Swallow initiation at pyriform  sinus;Penetration/Aspiration during swallow;Reduced airway/laryngeal closure;Trace aspiration    Pharyngeal Material enters airway, passes BELOW cords then ejected out    Pharyngeal- Thin Straw Delayed swallow initiation;Swallow initiation at pyriform sinus      Pharyngeal - Solids   Pharyngeal- Puree Within functional limits    Pharyngeal- Regular Within functional limits    Pharyngeal- Pill Within functional limits      Electrical Stimulation - Pharyngeal Phase   Was Electrical Stimulation Used No              Cricopharyngeal Phase - 07/07/22 1547       Cervical Esophageal Phase   Cervical Esophageal Phase Within functional limits              Plan - 07/07/22 1548     Clinical Impression Statement Pt presents with mild oropharyngeal dysphagia characterized by reduced lingual movement and rotary mastication with solids, delay in swallow initiation with swallow trigger at the level of the pyriforms with thins via tsp/cup/straw, reduced laryngeal vestibule closure, with trace penetration and aspiration (below vocal folds and removed, silent) of one presentation of tsp presentation thin and two presentations of cup sip thin (did not occur on the other 50% of presentations). Pt required 3 swallows to clear the regular textures in oral cavity, otherwise no significant pharyngeal residuals noted across consistencies/textures. The trace aspiration (briefly dropped below vocal folds and then remained in laryngeal vestibule) was not sensed via cough or throat clear, but when cued to clear her throat, it was removed. It was spontaneously cleared at other times. Pt had a difficult time producing a strong cough when requested. Recommend mechanical soft textures with regular texture trials with treating SLP and thin liquids via cup or straw sip and cue to swallow 2x for each bite/sip and clear throat/cough periodically. Pt may benefit from continued work on breath support and generation of cough  to facilitate airway protection. Ok for pills whole in water or in puree. Pt and daughter were provided with written recommendations and imaging was reviewed with them.              Patient will benefit from skilled therapeutic intervention in order to improve the following deficits and impairments:   Dysphagia, oropharyngeal phase     Recommendations/Treatment - 07/07/22 1547       Swallow Evaluation Recommendations   SLP Diet Recommendations Thin;Dysphagia 3 (mechanical soft);Age appropriate regular    Liquid Administration via Cup;Straw    Medication Administration Whole meds with liquid    Supervision Patient able to self feed;Full supervision/cueing for compensatory strategies    Compensations Lingual  sweep for clearance of pocketing;Multiple dry swallows after each bite/sip;Clear throat intermittently    Postural Changes Seated upright at 90 degrees;Remain upright for at least 30 minutes after feeds/meals              Prognosis - 07/07/22 1548       Prognosis   Prognosis for Safe Diet Advancement Good    Barriers to Reach Goals Cognitive deficits      Individuals Consulted   Consulted and Agree with Results and Recommendations Patient;Family member/caregiver    Report Sent to  Referring physician             Problem List Patient Active Problem List   Diagnosis Date Noted   Acute stroke due to ischemia ([place]) 04/26/2022   Hypertensive urgency 04/26/2022   Nicotine dependence, cigarettes, uncomplicated 19/41/7408   Chest pain 04/26/2022   Aneurysm ([place]) 10/25/2020   Dental disease 07/02/2018   Hemiparesis affecting right side as late effect of stroke ([place]) 06/17/2018   Stroke (cerebrum) ([place]) 06/17/2018   H/O medication noncompliance    Chronic pain syndrome    Leukocytosis    Tobacco abuse 06/13/2018   Lacunar stroke ([place]) 06/12/2018   Hypertension    COPD (chronic obstructive pulmonary disease) ([place])    GERD (gastroesophageal reflux disease)  04/06/2012   Nausea & vomiting 04/06/2012   Melena 04/06/2012    Bomani Oommen, CCC-SLP 07/07/2022, 3:49 PM  [place] [address] [place], [place], [address] Phone: [phone]   Fax:  [phone]  Name: Judy Boyd MRN: [id number] Date of Birth: 1960/08/20

## 2023-06-29 ENCOUNTER — Other Ambulatory Visit (HOSPITAL_COMMUNITY): Payer: Self-pay | Admitting: Internal Medicine

## 2023-06-29 DIAGNOSIS — R0989 Other specified symptoms and signs involving the circulatory and respiratory systems: Secondary | ICD-10-CM

## 2023-06-30 ENCOUNTER — Inpatient Hospital Stay (HOSPITAL_COMMUNITY)
Admission: EM | Admit: 2023-06-30 | Discharge: 2023-07-02 | DRG: 189 | Disposition: A | Discharge status: Home / Self Care | Payer: Medicare HMO | Attending: Family Medicine | Admitting: Family Medicine

## 2023-06-30 ENCOUNTER — Emergency Department (HOSPITAL_COMMUNITY): Payer: Medicare HMO

## 2023-06-30 ENCOUNTER — Inpatient Hospital Stay (HOSPITAL_COMMUNITY): Payer: Medicare HMO

## 2023-06-30 ENCOUNTER — Encounter (HOSPITAL_COMMUNITY): Payer: Self-pay

## 2023-06-30 ENCOUNTER — Other Ambulatory Visit: Payer: Self-pay

## 2023-06-30 DIAGNOSIS — R0902 Hypoxemia: Secondary | ICD-10-CM

## 2023-06-30 DIAGNOSIS — T502X5A Adverse effect of carbonic-anhydrase inhibitors, benzothiadiazides and other diuretics, initial encounter: Secondary | ICD-10-CM | POA: Diagnosis present

## 2023-06-30 DIAGNOSIS — I10 Essential (primary) hypertension: Secondary | ICD-10-CM | POA: Diagnosis present

## 2023-06-30 DIAGNOSIS — Z8249 Family history of ischemic heart disease and other diseases of the circulatory system: Secondary | ICD-10-CM | POA: Diagnosis not present

## 2023-06-30 DIAGNOSIS — J189 Pneumonia, unspecified organism: Secondary | ICD-10-CM

## 2023-06-30 DIAGNOSIS — J441 Chronic obstructive pulmonary disease with (acute) exacerbation: Secondary | ICD-10-CM | POA: Diagnosis present

## 2023-06-30 DIAGNOSIS — G8929 Other chronic pain: Secondary | ICD-10-CM | POA: Diagnosis present

## 2023-06-30 DIAGNOSIS — F32A Depression, unspecified: Secondary | ICD-10-CM | POA: Diagnosis present

## 2023-06-30 DIAGNOSIS — E876 Hypokalemia: Secondary | ICD-10-CM | POA: Diagnosis present

## 2023-06-30 DIAGNOSIS — L8990 Pressure ulcer of unspecified site, unspecified stage: Secondary | ICD-10-CM | POA: Insufficient documentation

## 2023-06-30 DIAGNOSIS — J439 Emphysema, unspecified: Secondary | ICD-10-CM | POA: Diagnosis present

## 2023-06-30 DIAGNOSIS — L89152 Pressure ulcer of sacral region, stage 2: Secondary | ICD-10-CM | POA: Diagnosis present

## 2023-06-30 DIAGNOSIS — N179 Acute kidney failure, unspecified: Principal | ICD-10-CM | POA: Diagnosis present

## 2023-06-30 DIAGNOSIS — Z886 Allergy status to analgesic agent status: Secondary | ICD-10-CM | POA: Diagnosis not present

## 2023-06-30 DIAGNOSIS — I639 Cerebral infarction, unspecified: Secondary | ICD-10-CM | POA: Diagnosis present

## 2023-06-30 DIAGNOSIS — F17210 Nicotine dependence, cigarettes, uncomplicated: Secondary | ICD-10-CM | POA: Diagnosis present

## 2023-06-30 DIAGNOSIS — M1990 Unspecified osteoarthritis, unspecified site: Secondary | ICD-10-CM | POA: Diagnosis present

## 2023-06-30 DIAGNOSIS — Z79899 Other long term (current) drug therapy: Secondary | ICD-10-CM | POA: Diagnosis not present

## 2023-06-30 DIAGNOSIS — J449 Chronic obstructive pulmonary disease, unspecified: Secondary | ICD-10-CM | POA: Diagnosis not present

## 2023-06-30 DIAGNOSIS — Z1152 Encounter for screening for COVID-19: Secondary | ICD-10-CM

## 2023-06-30 DIAGNOSIS — Z7951 Long term (current) use of inhaled steroids: Secondary | ICD-10-CM | POA: Diagnosis not present

## 2023-06-30 DIAGNOSIS — I69351 Hemiplegia and hemiparesis following cerebral infarction affecting right dominant side: Secondary | ICD-10-CM | POA: Diagnosis not present

## 2023-06-30 DIAGNOSIS — J9601 Acute respiratory failure with hypoxia: Principal | ICD-10-CM | POA: Diagnosis present

## 2023-06-30 DIAGNOSIS — F419 Anxiety disorder, unspecified: Secondary | ICD-10-CM | POA: Diagnosis present

## 2023-06-30 DIAGNOSIS — E871 Hypo-osmolality and hyponatremia: Secondary | ICD-10-CM | POA: Diagnosis present

## 2023-06-30 DIAGNOSIS — D649 Anemia, unspecified: Secondary | ICD-10-CM | POA: Diagnosis present

## 2023-06-30 DIAGNOSIS — Z7982 Long term (current) use of aspirin: Secondary | ICD-10-CM

## 2023-06-30 DIAGNOSIS — Z7901 Long term (current) use of anticoagulants: Secondary | ICD-10-CM | POA: Diagnosis not present

## 2023-06-30 DIAGNOSIS — R0602 Shortness of breath: Secondary | ICD-10-CM | POA: Diagnosis present

## 2023-06-30 DIAGNOSIS — K219 Gastro-esophageal reflux disease without esophagitis: Secondary | ICD-10-CM | POA: Diagnosis present

## 2023-06-30 DIAGNOSIS — Z885 Allergy status to narcotic agent status: Secondary | ICD-10-CM | POA: Diagnosis not present

## 2023-06-30 LAB — CBC WITH DIFFERENTIAL/PLATELET
Abs Immature Granulocytes: 0.06 10*3/uL (ref 0.00–0.07)
Basophils Absolute: 0.1 10*3/uL (ref 0.0–0.1)
Basophils Relative: 0 %
Eosinophils Absolute: 0 10*3/uL (ref 0.0–0.5)
Eosinophils Relative: 0 %
HCT: 39.2 % (ref 36.0–46.0)
Hemoglobin: 12.2 g/dL (ref 12.0–15.0)
Immature Granulocytes: 0 %
Lymphocytes Relative: 17 %
Lymphs Abs: 2.9 10*3/uL (ref 0.7–4.0)
MCH: 26.9 pg (ref 26.0–34.0)
MCHC: 31.1 g/dL (ref 30.0–36.0)
MCV: 86.3 fL (ref 80.0–100.0)
Monocytes Absolute: 0.8 10*3/uL (ref 0.1–1.0)
Monocytes Relative: 5 %
Neutro Abs: 13.3 10*3/uL — ABNORMAL HIGH (ref 1.7–7.7)
Neutrophils Relative %: 78 %
Platelets: 453 10*3/uL — ABNORMAL HIGH (ref 150–400)
RBC: 4.54 MIL/uL (ref 3.87–5.11)
RDW: 14.1 % (ref 11.5–15.5)
WBC: 17.1 10*3/uL — ABNORMAL HIGH (ref 4.0–10.5)
nRBC: 0 % (ref 0.0–0.2)

## 2023-06-30 LAB — BASIC METABOLIC PANEL
Anion gap: 18 — ABNORMAL HIGH (ref 5–15)
BUN: 39 mg/dL — ABNORMAL HIGH (ref 6–20)
CO2: 39 mmol/L — ABNORMAL HIGH (ref 22–32)
Calcium: 9.7 mg/dL (ref 8.9–10.3)
Chloride: 77 mmol/L — ABNORMAL LOW (ref 98–111)
Creatinine, Ser: 2.38 mg/dL — ABNORMAL HIGH (ref 0.44–1.00)
GFR, Estimated: 23 mL/min — ABNORMAL LOW (ref >60)
Glucose, Bld: 132 mg/dL — ABNORMAL HIGH (ref 70–99)
Potassium: 2 mmol/L — CL (ref 3.5–5.1)
Sodium: 134 mmol/L — ABNORMAL LOW (ref 135–145)

## 2023-06-30 LAB — URINALYSIS, ROUTINE W REFLEX MICROSCOPIC
Bilirubin Urine: NEGATIVE
Glucose, UA: NEGATIVE mg/dL
Ketones, ur: NEGATIVE mg/dL
Nitrite: NEGATIVE
Protein, ur: 30 mg/dL — AB
Specific Gravity, Urine: 1.005 (ref 1.005–1.030)
pH: 7 (ref 5.0–8.0)

## 2023-06-30 LAB — BRAIN NATRIURETIC PEPTIDE: B Natriuretic Peptide: 47 pg/mL (ref 0.0–100.0)

## 2023-06-30 LAB — RESP PANEL BY RT-PCR (RSV, FLU A&B, COVID)  RVPGX2
Influenza A by PCR: NEGATIVE
Influenza B by PCR: NEGATIVE
Resp Syncytial Virus by PCR: NEGATIVE
SARS Coronavirus 2 by RT PCR: NEGATIVE

## 2023-06-30 LAB — TROPONIN I (HIGH SENSITIVITY)
Troponin I (High Sensitivity): 19 ng/L — ABNORMAL HIGH (ref <18)
Troponin I (High Sensitivity): 18 ng/L — ABNORMAL HIGH (ref <18)

## 2023-06-30 LAB — LACTIC ACID, PLASMA
Lactic Acid, Venous: 1 mmol/L (ref 0.5–1.9)
Lactic Acid, Venous: 1 mmol/L (ref 0.5–1.9)
Lactic Acid, Venous: 1 mmol/L (ref 0.5–1.9)

## 2023-06-30 LAB — MAGNESIUM: Magnesium: 1.8 mg/dL (ref 1.7–2.4)

## 2023-06-30 LAB — MRSA NEXT GEN BY PCR, NASAL: MRSA by PCR Next Gen: NOT DETECTED

## 2023-06-30 MED ORDER — ASPIRIN 81 MG PO TBEC
81.0000 mg | DELAYED_RELEASE_TABLET | Freq: Every day | ORAL | Status: DC
  Administered 2023-07-01 – 2023-07-02 (×2): 81 mg via ORAL
  Filled 2023-06-30 (×2): qty 1

## 2023-06-30 MED ORDER — POTASSIUM CHLORIDE CRYS ER 20 MEQ PO TBCR
40.0000 meq | EXTENDED_RELEASE_TABLET | Freq: Once | ORAL | Status: AC
  Administered 2023-06-30: 40 meq via ORAL
  Filled 2023-06-30: qty 2

## 2023-06-30 MED ORDER — ACETAMINOPHEN 325 MG PO TABS
650.0000 mg | ORAL_TABLET | Freq: Four times a day (QID) | ORAL | Status: DC | PRN
  Administered 2023-06-30: 650 mg via ORAL
  Filled 2023-06-30: qty 2

## 2023-06-30 MED ORDER — GABAPENTIN 100 MG PO CAPS
100.0000 mg | ORAL_CAPSULE | Freq: Every day | ORAL | Status: DC
  Administered 2023-06-30 – 2023-07-02 (×3): 100 mg via ORAL
  Filled 2023-06-30 (×3): qty 1

## 2023-06-30 MED ORDER — PROMETHAZINE HCL 12.5 MG PO TABS: 12.5000 mg | ORAL_TABLET | Freq: Four times a day (QID) | ORAL | Status: DC | PRN

## 2023-06-30 MED ORDER — ALBUTEROL SULFATE HFA 108 (90 BASE) MCG/ACT IN AERS: 2.0000 | INHALATION_SPRAY | RESPIRATORY_TRACT | Status: DC | PRN

## 2023-06-30 MED ORDER — MIRTAZAPINE 15 MG PO TABS
15.0000 mg | ORAL_TABLET | Freq: Every day | ORAL | Status: DC
  Administered 2023-06-30 – 2023-07-02 (×3): 15 mg via ORAL
  Filled 2023-06-30 (×3): qty 1

## 2023-06-30 MED ORDER — HYDROCODONE-ACETAMINOPHEN 5-325 MG PO TABS
1.0000 | ORAL_TABLET | Freq: Three times a day (TID) | ORAL | Status: DC | PRN
  Administered 2023-06-30: 1 via ORAL
  Filled 2023-06-30: qty 1

## 2023-06-30 MED ORDER — METHYLPREDNISOLONE SODIUM SUCC 125 MG IJ SOLR
60.0000 mg | Freq: Two times a day (BID) | INTRAMUSCULAR | Status: DC
  Administered 2023-07-01 – 2023-07-02 (×4): 60 mg via INTRAVENOUS
  Filled 2023-06-30 (×4): qty 2

## 2023-06-30 MED ORDER — ACETAMINOPHEN 650 MG RE SUPP: 650.0000 mg | Freq: Four times a day (QID) | RECTAL | Status: DC | PRN

## 2023-06-30 MED ORDER — LACTATED RINGERS IV SOLN: INTRAVENOUS | Status: DC

## 2023-06-30 MED ORDER — CITALOPRAM HYDROBROMIDE 20 MG PO TABS
10.0000 mg | ORAL_TABLET | Freq: Every day | ORAL | Status: DC
  Administered 2023-07-01 – 2023-07-02 (×2): 10 mg via ORAL
  Filled 2023-06-30 (×2): qty 1

## 2023-06-30 MED ORDER — TIZANIDINE HCL 2 MG PO TABS
4.0000 mg | ORAL_TABLET | Freq: Two times a day (BID) | ORAL | Status: DC | PRN
  Administered 2023-06-30: 4 mg via ORAL
  Filled 2023-06-30: qty 2

## 2023-06-30 MED ORDER — DEXTROSE 5 % IV SOLN
500.0000 mg | INTRAVENOUS | Status: DC
  Administered 2023-06-30: 500 mg via INTRAVENOUS
  Filled 2023-06-30: qty 5

## 2023-06-30 MED ORDER — CHLORHEXIDINE GLUCONATE CLOTH 2 % EX PADS
6.0000 | MEDICATED_PAD | Freq: Every day | CUTANEOUS | Status: DC
  Administered 2023-07-01 – 2023-07-02 (×2): 6 via TOPICAL

## 2023-06-30 MED ORDER — METHYLPREDNISOLONE SODIUM SUCC 125 MG IJ SOLR
62.5000 mg | Freq: Once | INTRAMUSCULAR | Status: AC
  Administered 2023-06-30: 62.5 mg via INTRAVENOUS
  Filled 2023-06-30: qty 2

## 2023-06-30 MED ORDER — IPRATROPIUM-ALBUTEROL 0.5-2.5 (3) MG/3ML IN SOLN: 3.0000 mL | RESPIRATORY_TRACT | Status: DC | PRN

## 2023-06-30 MED ORDER — POTASSIUM CHLORIDE CRYS ER 20 MEQ PO TBCR
40.0000 meq | EXTENDED_RELEASE_TABLET | Freq: Once | ORAL | Status: AC
Start: 2023-06-30 — End: 2023-06-30
  Administered 2023-06-30: 40 meq via ORAL
  Filled 2023-06-30: qty 2

## 2023-06-30 MED ORDER — POTASSIUM CHLORIDE 10 MEQ/100ML IV SOLN
10.0000 meq | INTRAVENOUS | Status: AC
  Administered 2023-06-30 (×4): 10 meq via INTRAVENOUS
  Filled 2023-06-30 (×3): qty 100

## 2023-06-30 MED ORDER — MAGNESIUM SULFATE 2 GM/50ML IV SOLN
2.0000 g | Freq: Once | INTRAVENOUS | Status: AC
  Administered 2023-06-30: 2 g via INTRAVENOUS
  Filled 2023-06-30: qty 50

## 2023-06-30 MED ORDER — AMLODIPINE BESYLATE 5 MG PO TABS: 10.0000 mg | ORAL_TABLET | Freq: Every day | ORAL | Status: DC

## 2023-06-30 MED ORDER — SODIUM CHLORIDE 0.9 % IV SOLN
2.0000 g | INTRAVENOUS | Status: DC
  Administered 2023-06-30 – 2023-07-02 (×4): 2 g via INTRAVENOUS
  Filled 2023-06-30 (×3): qty 20

## 2023-06-30 MED ORDER — ROSUVASTATIN CALCIUM 20 MG PO TABS
40.0000 mg | ORAL_TABLET | Freq: Every day | ORAL | Status: DC
  Administered 2023-07-01 – 2023-07-02 (×2): 40 mg via ORAL
  Filled 2023-06-30 (×2): qty 2

## 2023-06-30 MED ORDER — PANTOPRAZOLE SODIUM 40 MG PO TBEC
40.0000 mg | DELAYED_RELEASE_TABLET | Freq: Every day | ORAL | Status: DC
  Administered 2023-07-01 – 2023-07-02 (×2): 40 mg via ORAL
  Filled 2023-06-30 (×2): qty 1

## 2023-06-30 MED ORDER — SODIUM CHLORIDE 0.9 % IV BOLUS
1000.0000 mL | Freq: Once | INTRAVENOUS | Status: AC
  Administered 2023-06-30: 1000 mL via INTRAVENOUS

## 2023-06-30 MED ORDER — RIVAROXABAN 20 MG PO TABS
20.0000 mg | ORAL_TABLET | Freq: Every day | ORAL | Status: DC
  Administered 2023-07-01 – 2023-07-02 (×2): 20 mg via ORAL
  Filled 2023-06-30 (×2): qty 1

## 2023-06-30 MED ORDER — POLYETHYLENE GLYCOL 3350 17 G PO PACK: 17.0000 g | PACK | Freq: Every day | ORAL | Status: DC | PRN

## 2023-06-30 MED ORDER — IPRATROPIUM-ALBUTEROL 0.5-2.5 (3) MG/3ML IN SOLN
3.0000 mL | Freq: Three times a day (TID) | RESPIRATORY_TRACT | Status: AC
  Administered 2023-07-01: 3 mL via RESPIRATORY_TRACT
  Filled 2023-06-30: qty 3

## 2023-06-30 MED ORDER — IPRATROPIUM-ALBUTEROL 0.5-2.5 (3) MG/3ML IN SOLN
3.0000 mL | Freq: Once | RESPIRATORY_TRACT | Status: AC
  Administered 2023-06-30: 3 mL via RESPIRATORY_TRACT
  Filled 2023-06-30: qty 3

## 2023-06-30 MED ORDER — POTASSIUM CHLORIDE IN NACL 20-0.9 MEQ/L-% IV SOLN: INTRAVENOUS | Status: AC

## 2023-06-30 NOTE — Progress Notes (Signed)
Pt arrived to unit at shift change. Patient cleaned with CHG wipes, external cath set up, placed on monitor, & oxygen.  Normotensive, 2L [place], A/Ox4. Afebrile.  Pt apparently bed bound. Dgt takes care of her at home. Small open wound / CAPI noted to buttocks   Blanket, phone, charge, phone stand, & glasses set up at bedside.

## 2023-06-30 NOTE — ED Triage Notes (Signed)
Pt BIB ems for shortness of breath. Pt had chest xray last week which showed a bacterial infection. Pt was suppose to have an outpt CT for further testing. Per EMS pt increased SOB and family wanted pt brought to be evaluated.

## 2023-06-30 NOTE — Assessment & Plan Note (Signed)
Severe hypokalemia potassium 2.  Likely from diuretics and poor oral intake.  She reports 1 episode of vomiting today. -Replete K IV and oral

## 2023-06-30 NOTE — Sepsis Progress Note (Signed)
eLink is following this Code Sepsis. °

## 2023-06-30 NOTE — H&P (Addendum)
History and Physical    Judy Boyd:[id number] DOB: February 26, 1961 DOA: 06/30/2023  PCP: Pcp, No   Patient coming from: Home  I have personally briefly reviewed patient's old medical records in [facility]  Chief Complaint: Increasing difficulty breathing  HPI: Judy Boyd is a 62 y.o. female with medical history significant for COPD, hypertension, stroke with residual hemiparesis, anxiety. Patient is able to answer questions, but she has baseline speech impairment.  Daughter Judy Boyd at bedside assist with history.  Patient presented to the ED with complaints of increasing difficulty breathing, over the past week.  No cough.  Daughter reports O2 sats 80s to 89% since onset of symptoms, patient is not on chronic O2.  She has also had poor oral intake over the past several days.  1 episode of vomiting today, no diarrhea.  No chest or abdominal pain.  She has had bilateral lower extremity swelling intermittent over the past year, for which she was placed on Lasix, but 2 weeks ago this was discontinued and then resumed 4 days ago. Last week, patient was diagnosed with an infection in her chest and started on a course of doxycycline, today is patient's 4th day of a 10day course.  She is also on Xarelto and compliant. Patient is not ambulatory.  ED Course: Tmax 98.8.  Respiratory 18-26.  Heart rate 96-101.  Blood pressure systolic 120s to 846.  O2 sat 88% on room air, placed on 4 L. Low Potassium 2. Elevated creatinine 2.38.  Leukocytosis of 17.1.  Troponin 19.  COVID test negative EDP reports wheezing on auscultation and reduced breath sounds. Chest x-ray pending IV ceftriaxone and azithromycin started. 1 L bolus given.  Hospitalist to admit for acute hypoxic respiratory failure, AKI and hypokalemia.  Review of Systems: As per HPI all other systems reviewed and negative.  Past Medical History:  Diagnosis Date   Anxiety    Arthritis    Asthma    Bowel obstruction (HCC)     Chronic pain    COPD (chronic obstructive pulmonary disease) (HCC)    Depression    GERD (gastroesophageal reflux disease)    Hypertension    Stroke [facility])     Past Surgical History:  Procedure Laterality Date   ABDOMINAL SURGERY  2003   blockage   left salpingectomy     uterine ablation       reports that she has been smoking cigarettes. She has a 10 pack-year smoking history. She has never used smokeless tobacco. She reports current alcohol use. She reports that she does not use drugs.  Allergies  Allergen Reactions   Amlodipine Other (See Comments)    Dehydration    Ibuprofen Other (See Comments)    Unknown    Tramadol Other (See Comments)    unknown    Family History  Problem Relation Age of Onset   Heart disease Mother    Heart disease Father    Prior to Admission medications   Medication Sig Start Date End Date Taking? Authorizing Provider  doxycycline (VIBRAMYCIN) 100 MG capsule Take 100 mg by mouth 2 (two) times daily. 06/26/23  Yes [provider]  esomeprazole (NEXIUM) 20 MG capsule Take 20 mg by mouth every morning. 06/17/23  Yes [provider]  albuterol (PROVENTIL HFA;VENTOLIN HFA) 108 (90 BASE) MCG/ACT inhaler Inhale 2 puffs into the lungs every 6 (six) hours as needed. Shortness of breath    [provider]  albuterol (PROVENTIL) (2.5 MG/3ML) 0.083% nebulizer solution Take  3 mLs by nebulization every 6 (six) hours as needed for wheezing or shortness of breath.    [provider]  amLODipine (NORVASC) 10 MG tablet Take 10 mg by mouth daily.    [provider]  aspirin EC 81 MG tablet Take 81 mg by mouth daily.    [provider]  citalopram (CELEXA) 10 MG tablet Take 10 mg by mouth daily. 05/06/22   [provider]  furosemide (LASIX) 20 MG tablet Take 20 mg by mouth daily as needed for fluid or edema.    [provider]  gabapentin (NEURONTIN) 100 MG capsule Take 100 mg by mouth at  bedtime.    [provider]  hydrochlorothiazide (HYDRODIURIL) 25 MG tablet Take 25 mg by mouth daily.    [provider]  HYDROcodone-acetaminophen (NORCO/VICODIN) 5-325 MG tablet Take 1 tablet by mouth every 8 (eight) hours as needed for moderate pain (pain score 4-6). 06/18/23 07/18/23  [provider]  mirtazapine (REMERON) 15 MG tablet Take 15 mg by mouth at bedtime.    [provider]  rosuvastatin (CRESTOR) 40 MG tablet Take 40 mg by mouth daily.    [provider]  SYMBICORT 160-4.5 MCG/ACT inhaler Inhale 2 puffs into the lungs in the morning and at bedtime.    [provider]  tiZANidine (ZANAFLEX) 4 MG tablet Take 4 mg by mouth 3 (three) times daily.    [provider]  XARELTO 20 MG TABS tablet Take 20 mg by mouth daily with supper.    [provider]    Physical Exam: Vitals:   06/30/23 1329 06/30/23 1333 06/30/23 1337 06/30/23 1445  BP:  120/67  131/83  Pulse:  96 96 (!) 101  Resp:  18  (!) 26  Temp:  98.8 F (37.1 C)    TempSrc:  Oral    SpO2:  (!) 88% 96% 97%  Weight: 62.6 kg     Height: 5\' 6"  (1.676 m)       Constitutional: NAD, calm, comfortable Vitals:   06/30/23 1329 06/30/23 1333 06/30/23 1337 06/30/23 1445  BP:  120/67  131/83  Pulse:  96 96 (!) 101  Resp:  18  (!) 26  Temp:  98.8 F (37.1 C)    TempSrc:  Oral    SpO2:  (!) 88% 96% 97%  Weight: 62.6 kg     Height: 5\' 6"  (1.676 m)      Eyes: PERRL, lids and conjunctivae normal ENMT: Mucous membranes are moist.   Neck: normal, supple, no masses, no thyromegaly Respiratory: clear to auscultation bilaterally, no wheezing, no crackles. Normal respiratory effort. No accessory muscle use.  Cardiovascular: Regular rate and rhythm, no murmurs / rubs / gallops.  1+ pitting bilateral lower extremity edema worse on the left involving lower third of legs.  Extremities warm .  Abdomen: no tenderness, no masses palpated. No hepatosplenomegaly.  Bowel sounds positive.  Musculoskeletal: no clubbing / cyanosis. No joint deformity upper and lower extremities.  Skin: no rashes, lesions, ulcers. No induration Neurologic:  baseline speech impediment, no facial asymmetry, moving extremities spontaneously, with baseline hemiparesis .  Psychiatric: Normal judgment and insight. Alert and oriented x 3. Normal mood.   Labs on Admission: I have personally reviewed following labs and imaging studies  CBC: Recent Labs  Lab 06/30/23 1410  WBC 17.1*  NEUTROABS 13.3*  HGB 12.2  HCT 39.2  MCV 86.3  PLT 453*   Basic Metabolic Panel: Recent Labs  Lab 06/30/23  1410  NA 134*  K 2.0*  CL 77*  CO2 39*  GLUCOSE 132*  BUN 39*  CREATININE 2.38*  CALCIUM 9.7    Radiological Exams on Admission: No results found.  Pending  EKG: Independently reviewed.   Assessment/Plan Principal Problem:   Acute hypoxic respiratory failure (HCC) Active Problems:   AKI (acute kidney injury) (HCC)   Hypokalemia   Hypertension   Hemiparesis affecting right side as late effect of stroke (HCC)   Stroke (cerebrum) (HCC)   COPD with acute exacerbation (HCC)   Pressure injury of skin  Assessment and Plan: * Acute hypoxic respiratory failure (HCC) O2 sats down to 88% on room air, currently on 4 L. EDP reports initial wheezing in ED, she is status post DuoNebs, on my evaluation chest exam is unremarkable.  Per PCP notes 11/8- some venous congestion or pulmonary hypertension-- could be starts of bronchopneumonia will treat for possible pneumonia cause.  She is on day 4 of doxycycline with worsening symptoms.  She is on Xarelto and compliant.  COVID test negative. -Chest x-ray without acute abnormality, obtain CT without contrast  -IV ceftriaxone and azithromycin started in ED Addendum-CT negative acute abnormality.  Hypoxia likely secondary to COPD exacerbation.  Hypokalemia Severe hypokalemia potassium 2.  Likely from diuretics and poor oral intake.  She  reports 1 episode of vomiting today. -Replete K IV and oral  AKI (acute kidney injury) (HCC) Creatinine elevated 2.38.  Baseline 0.6-0.9.  Likely due to poor oral intake, sleep.  Lasix also which was just restarted 4 days ago after a 2-week break. -1 L bolus given, continue N/s + 20 kcl 100cc/hr x 15hrs  Stroke (cerebrum) (HCC) With baseline hemiparesis.Per daughter, patient was placed on xarelto to prevent recurrent stroke.  -Resume aspirin, statin, Xarelto  Hypertension Stable. -Hold HCTZ, Lasix with AKI  COPD with acute exacerbation (HCC) EDP reports wheezing on exam, resolved on my evaluation, s/p bronchodils. Presenting with hypoxia which may be 2/2 COPD.  With a negative abnormality.  Hypoxia likely secondary to COPD exacerbation.   -DuoNebs as needed and scheduled -Solu-Medrol -DC azithromycin continue with ceftriaxone COPD exacerbation   DVT prophylaxis: Xarelto Code Status: Full code -  confirmed with patient and daughter at bedside Family Communication: daughterBerlin Hun at bedside Disposition Plan: ~ 2 days Consults called: None Admission status:  Inpt Stepdown I certify that at the point of admission it is my clinical judgment that the patient will require inpatient hospital care spanning beyond 2 midnights from the point of admission due to high intensity of service, high risk for further deterioration and high frequency of surveillance required.  Author: Onnie Boer, MD 06/30/2023 11:21 PM  For on call review [email or web address].

## 2023-06-30 NOTE — Assessment & Plan Note (Addendum)
O2 sats down to 88% on room air, currently on 4 L. EDP reports initial wheezing in ED, she is status post DuoNebs, on my evaluation chest exam is unremarkable.  Per PCP notes 11/8- some venous congestion or pulmonary hypertension-- could be starts of bronchopneumonia will treat for possible pneumonia cause.  She is on day 4 of doxycycline with worsening symptoms.  She is on Xarelto and compliant.  COVID test negative. -Chest x-ray without acute abnormality, obtain CT without contrast  -IV ceftriaxone and azithromycin started in ED Addendum-CT negative acute abnormality.  Hypoxia likely secondary to COPD exacerbation.

## 2023-06-30 NOTE — ED Notes (Signed)
2 RN's in her room now with the [place] trying to find a vein. Pt had a 22G that had to be removed. Her meds were paused and she isnt receiving anything at the moment.

## 2023-06-30 NOTE — Assessment & Plan Note (Signed)
Stable. -Hold HCTZ, Lasix with AKI

## 2023-06-30 NOTE — Assessment & Plan Note (Addendum)
EDP reports wheezing on exam, resolved on my evaluation, s/p bronchodils. Presenting with hypoxia which may be 2/2 COPD.  With a negative abnormality.  Hypoxia likely secondary to COPD exacerbation.   -DuoNebs as needed and scheduled -Solu-Medrol -DC azithromycin continue with ceftriaxone COPD exacerbation

## 2023-06-30 NOTE — ED Provider Notes (Signed)
[place] EMERGENCY DEPARTMENT AT [facility] Provider Note  CSN: [id number] Arrival date & time: 06/30/23 1319  Chief Complaint(s) Shortness of Breath (/)  HPI Judy Boyd is a 62 y.o. 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  Patient reports she was diagnosed with pneumonia by PCP about a week ago, she has been compliant with oral antibiotics.  Patient reports progressive difficulty breathing, is been feeling unwell, symptoms worsened over the past few days.  Poor p.o. intake/poor appetite last few days.  No vomiting.  No change urination.  She is nonambulatory at baseline over the past year from prior CVA.  No fevers but she is having chills.  No sick contacts or recent travel.  Intermittent productive cough with yellow phlegm.  No chest pain.  No home oxygen use.  Poor p.o. intake last few days.  She is taking diuretic  Past Medical History Past Medical History:  Diagnosis Date   Anxiety    Arthritis    Asthma    Bowel obstruction (HCC)    Chronic pain    COPD (chronic obstructive pulmonary disease) (HCC)    Depression    GERD (gastroesophageal reflux disease)    Hypertension    Stroke [facility])    Patient Active Problem List   Diagnosis Date Noted   Acute hypoxic respiratory failure (HCC) 06/30/2023   Acute stroke due to ischemia (HCC) 04/26/2022   Hypertensive urgency 04/26/2022   Nicotine dependence, cigarettes, uncomplicated 04/26/2022   Chest pain 04/26/2022   Aneurysm (HCC) 10/25/2020   Dental disease 07/02/2018   Hemiparesis affecting right side as late effect of stroke (HCC) 06/17/2018   Stroke (cerebrum) (HCC) 06/17/2018   H/O medication noncompliance    Chronic pain syndrome    Leukocytosis    Tobacco abuse 06/13/2018   Lacunar stroke (HCC) 06/12/2018   Hypertension    COPD (chronic  obstructive pulmonary disease) (HCC)    GERD (gastroesophageal reflux disease) 04/06/2012   Nausea & vomiting 04/06/2012   Melena 04/06/2012   Home Medication(s) Prior to Admission medications   Medication Sig Start Date End Date Taking? Authorizing Provider  albuterol (PROVENTIL HFA;VENTOLIN HFA) 108 (90 BASE) MCG/ACT inhaler Inhale 2 puffs into the lungs every 6 (six) hours as needed. Shortness of breath   Yes [provider]  amLODipine (NORVASC) 10 MG tablet Take 10 mg by mouth daily.   Yes [provider]  aspirin EC 81 MG tablet Take 81 mg by mouth daily.   Yes [provider]  citalopram (CELEXA) 10 MG tablet Take 10 mg by mouth daily. 05/06/22  Yes [provider]  Cyanocobalamin (B-12 PO) Take 1 tablet by mouth daily.   Yes [provider]  doxycycline (VIBRAMYCIN) 100 MG capsule Take 100 mg by mouth 2 (two) times daily. 06/26/23  Yes [provider]  esomeprazole (NEXIUM) 20 MG capsule Take 20 mg by mouth every morning. 06/17/23  Yes [provider]  furosemide (LASIX) 20 MG tablet Take 20 mg by mouth daily as needed for fluid or edema.   Yes [provider]  gabapentin (NEURONTIN) 100 MG capsule Take 100 mg by mouth at bedtime.   Yes [provider]  hydrochlorothiazide (HYDRODIURIL) 25 MG tablet Take 25 mg by mouth daily.   Yes [provider]  HYDROcodone-acetaminophen (NORCO/VICODIN) 5-325 MG tablet Take 1 tablet by mouth  every 8 (eight) hours as needed for moderate pain (pain score 4-6). 06/18/23 07/18/23 Yes [provider]  mirtazapine (REMERON) 15 MG tablet Take 15 mg by mouth at bedtime.   Yes [provider]  Multiple Vitamins-Minerals (CENTRUM SILVER 50+WOMEN) TABS Take 1 tablet by mouth daily.   Yes [provider]  rosuvastatin (CRESTOR) 40 MG tablet Take 40 mg by mouth daily.   Yes [provider]  SYMBICORT 160-4.5 MCG/ACT inhaler Inhale 2 puffs  into the lungs in the morning and at bedtime.   Yes [provider]  tiZANidine (ZANAFLEX) 4 MG tablet Take 4 mg by mouth 2 (two) times daily as needed for muscle spasms.   Yes [provider]  XARELTO 20 MG TABS tablet Take 20 mg by mouth daily with supper.   Yes [provider]                                                                                                                                    Past Surgical History Past Surgical History:  Procedure Laterality Date   ABDOMINAL SURGERY  2003   blockage   left salpingectomy     uterine ablation     Family History Family History  Problem Relation Age of Onset   Heart disease Mother    Heart disease Father     Social History Social History   Tobacco Use   Smoking status: Every Day    Current packs/day: 1.00    Average packs/day: 1 pack/day for 10.0 years (10.0 ttl pk-yrs)    Types: Cigarettes   Smokeless tobacco: Never  Vaping Use   Vaping status: Never Used  Substance Use Topics   Alcohol use: Yes    Comment: 2 beers per week   Drug use: No   Allergies Ibuprofen and Tramadol  Review of Systems Review of Systems  Constitutional:  Positive for appetite change, chills and fatigue. Negative for fever.  Respiratory:  Positive for cough, chest tightness, shortness of breath and wheezing.   Cardiovascular:  Negative for chest pain and palpitations.  Gastrointestinal:  Negative for abdominal pain, nausea and vomiting.  Genitourinary:  Negative for flank pain.  Skin:  Negative for rash.  Neurological:  Negative for weakness and light-headedness.  All other systems reviewed and are negative.   Physical Exam Vital Signs  I have reviewed the triage vital signs BP 131/83   Pulse (!) 101   Temp 98.8 F (37.1 C) (Oral)   Resp (!) 26   Ht 5\' 6"  (1.676 m)   Wt 62.6 kg   SpO2 97%   BMI 22.28 kg/m  Physical Exam Vitals and nursing note reviewed.  Constitutional:      General: She is  not in acute distress.    Appearance: Normal appearance. She is well-developed. She is not ill-appearing.  HENT:     Head: Normocephalic and atraumatic.  Right Ear: External ear normal.     Left Ear: External ear normal.     Nose: Nose normal.     Mouth/Throat:     Mouth: Mucous membranes are dry.  Eyes:     General: No scleral icterus.       Right eye: No discharge.        Left eye: No discharge.  Cardiovascular:     Rate and Rhythm: Normal rate and regular rhythm.     Pulses: Normal pulses.     Heart sounds: Normal heart sounds.  Pulmonary:     Effort: Pulmonary effort is normal. No respiratory distress.     Breath sounds: No stridor. Wheezing present.     Comments: Diminished throughout, wheezing throughout Abdominal:     General: Abdomen is flat. There is no distension.     Palpations: Abdomen is soft.     Tenderness: There is no abdominal tenderness.  Musculoskeletal:     Cervical back: No rigidity.     Right lower leg: No edema.     Left lower leg: No edema.  Skin:    General: Skin is warm and dry.     Capillary Refill: Capillary refill takes less than 2 seconds.  Neurological:     Mental Status: She is alert.  Psychiatric:        Mood and Affect: Mood normal.        Behavior: Behavior normal. Behavior is cooperative.     ED Results and Treatments Labs (all labs ordered are listed, but only abnormal results are displayed) Labs Reviewed  BASIC METABOLIC PANEL - Abnormal; Notable for the following components:      Result Value   Sodium 134 (*)    Potassium 2.0 (*)    Chloride 77 (*)    CO2 39 (*)    Glucose, Bld 132 (*)    BUN 39 (*)    Creatinine, Ser 2.38 (*)    GFR, Estimated 23 (*)    Anion gap 18 (*)    All other components within normal limits  CBC WITH DIFFERENTIAL/PLATELET - Abnormal; Notable for the following components:   WBC 17.1 (*)    Platelets 453 (*)    Neutro Abs 13.3 (*)    All other components within normal limits  TROPONIN I (HIGH  SENSITIVITY) - Abnormal; Notable for the following components:   Troponin I (High Sensitivity) 19 (*)    All other components within normal limits  RESP PANEL BY RT-PCR (RSV, FLU A&B, COVID)  RVPGX2  CULTURE, BLOOD (ROUTINE X 2)  CULTURE, BLOOD (ROUTINE X 2)  BRAIN NATRIURETIC PEPTIDE  MAGNESIUM  URINALYSIS, ROUTINE W REFLEX MICROSCOPIC  LACTIC ACID, PLASMA  LACTIC ACID, PLASMA  TROPONIN I (HIGH SENSITIVITY)                                                                                                                          Radiology No results found.  Pertinent labs &  imaging results that were available during my care of the patient were reviewed by me and considered in my medical decision making (see MDM for details).  Medications Ordered in ED Medications  magnesium sulfate IVPB 2 g 50 mL (has no administration in time range)  potassium chloride 10 mEq in 100 mL IVPB (0 mEq Intravenous Paused 06/30/23 1557)  ipratropium-albuterol (DUONEB) 0.5-2.5 (3) MG/3ML nebulizer solution 3 mL (has no administration in time range)  lactated ringers infusion (has no administration in time range)  cefTRIAXone (ROCEPHIN) 2 g in sodium chloride 0.9 % 100 mL IVPB (0 g Intravenous Paused 06/30/23 1558)  azithromycin (ZITHROMAX) 500 mg in dextrose 5 % 250 mL IVPB (has no administration in time range)  sodium chloride 0.9 % bolus 1,000 mL (0 mLs Intravenous Paused 06/30/23 1558)  potassium chloride SA (KLOR-CON M) CR tablet 40 mEq (40 mEq Oral Given 06/30/23 1528)  methylPREDNISolone sodium succinate (SOLU-MEDROL) 125 mg/2 mL injection 62.5 mg (62.5 mg Intravenous Given 06/30/23 1533)                                                                                                                                     Procedures .Critical Care  Performed by: Sloan Leiter, DO Authorized by: Sloan Leiter, DO   Critical care provider statement:    Critical care time (minutes):  45   Critical  care time was exclusive of:  Separately billable procedures and treating other patients   Critical care was necessary to treat or prevent imminent or life-threatening deterioration of the following conditions:  Respiratory failure and sepsis   Critical care was time spent personally by me on the following activities:  Development of treatment plan with patient or surrogate, discussions with consultants, evaluation of patient's response to treatment, examination of patient, ordering and review of laboratory studies, ordering and review of radiographic studies, ordering and performing treatments and interventions, pulse oximetry, re-evaluation of patient's condition, review of old charts and obtaining history from patient or surrogate   Care discussed with: admitting provider     (including critical care time)  Medical Decision Making / ED Course    Medical Decision Making:    PERRIE BASCUE is a 62 y.o. 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  Serious etiology was considered. Ddx includes but is not limited to: In my evaluation of this patient's dyspnea my DDx includes, but is not limited to, pneumonia, pulmonary embolism, pneumothorax, pulmonary edema, metabolic acidosis, asthma, COPD, cardiac cause, anemia, anxiety, etc.    Complete initial physical exam performed, notably the patient  was hypoxia on room air, placed on nasal cannula with improvement, wheezing throughout, diminished throughout,  appears dry.    Reviewed and confirmed nursing documentation for past medical history, family history, social history.  Vital signs reviewed.    Clinical Course as of 06/30/23 1612  Tue Jun 30, 2023  1439 No home O2  typically, she is requiring 4LNC. On RA pulse ox 88% [SG]  1508 Creatinine(!): 2.38 AKI, BUN is 39, K low, will replete, give magnesium sulfate. IVF in process [SG]  1508 Poor PO last few days, favor pre-renal source of AKI [SG]  1517 Pulse ox 98% on 4LNC (no home o2 requirement) [SG]    Clinical Course User Index [SG] Sloan Leiter, DO     Labs reviewed, concerning for AKI, hypokalemia, uremia.  She has significant leukocytosis, afebrile.  Hypoxia.  Troponin elevated  Workup concerning for sepsis with likely pulmonary source.  X-ray pending, on wet read appears to show pneumonia, formal read pending   She is anticoagulated on DOAC, no missed doses, no chest pain.  I did consider PE however AKI precludes evaluation with IV contrast.   Recommend admission for hypokalemia, AKI, sepsis likely due to pneumonia, respiratory distress.  Patient agreeable, discussed with family at bedside as well per patient request.                 Additional history obtained: -Additional history obtained from family -External records from outside source obtained and reviewed including: Chart review including previous notes, labs, imaging, consultation notes including  Home medications Prior admission Prior labs   Lab Tests: -I ordered, reviewed, and interpreted labs.   The pertinent results include:   Labs Reviewed  BASIC METABOLIC PANEL - Abnormal; Notable for the following components:      Result Value   Sodium 134 (*)    Potassium 2.0 (*)    Chloride 77 (*)    CO2 39 (*)    Glucose, Bld 132 (*)    BUN 39 (*)    Creatinine, Ser 2.38 (*)    GFR, Estimated 23 (*)    Anion gap 18 (*)    All other components within normal limits  CBC WITH DIFFERENTIAL/PLATELET - Abnormal; Notable for the following components:   WBC 17.1 (*)    Platelets 453 (*)    Neutro Abs 13.3 (*)    All other components within normal limits  TROPONIN I (HIGH SENSITIVITY) - Abnormal; Notable for the  following components:   Troponin I (High Sensitivity) 19 (*)    All other components within normal limits  RESP PANEL BY RT-PCR (RSV, FLU A&B, COVID)  RVPGX2  CULTURE, BLOOD (ROUTINE X 2)  CULTURE, BLOOD (ROUTINE X 2)  BRAIN NATRIURETIC PEPTIDE  MAGNESIUM  URINALYSIS, ROUTINE W REFLEX MICROSCOPIC  LACTIC ACID, PLASMA  LACTIC ACID, PLASMA  TROPONIN I (HIGH SENSITIVITY)    Notable for as above, aki, K + low  EKG   EKG Interpretation Date/Time:    Ventricular Rate:    PR Interval:    QRS Duration:    QT Interval:    QTC Calculation:   R Axis:      Text Interpretation:           Imaging Studies ordered: I ordered imaging studies including CXR I independently visualized the following imaging with scope of interpretation limited to determining acute life threatening conditions related to emergency care; concern for pna  I agree with the radiologist interpretation   Medicines ordered and prescription drug management: Meds ordered this encounter  Medications   DISCONTD: albuterol (VENTOLIN HFA) 108 (  90 Base) MCG/ACT inhaler 2 puff   sodium chloride 0.9 % bolus 1,000 mL   magnesium sulfate IVPB 2 g 50 mL   potassium chloride SA (KLOR-CON M) CR tablet 40 mEq   potassium chloride 10 mEq in 100 mL IVPB   ipratropium-albuterol (DUONEB) 0.5-2.5 (3) MG/3ML nebulizer solution 3 mL   methylPREDNISolone sodium succinate (SOLU-MEDROL) 125 mg/2 mL injection 62.5 mg    IV methylprednisolone will be converted to either a q12h or q24h frequency with the same total daily dose (TDD).  Ordered Dose: 1 to 125 mg TDD; convert to: TDD q24h.  Ordered Dose: 126 to 250 mg TDD; convert to: TDD div q12h.  Ordered Dose: >250 mg TDD; DAW.   lactated ringers infusion   cefTRIAXone (ROCEPHIN) 2 g in sodium chloride 0.9 % 100 mL IVPB    Order Specific Question:   Antibiotic Indication:    Answer:   CAP   azithromycin (ZITHROMAX) 500 mg in dextrose 5 % 250 mL IVPB    Order Specific Question:    Antibiotic Indication:    Answer:   CAP    -I have reviewed the patients home medicines and have made adjustments as needed   Consultations Obtained: na   Cardiac Monitoring: The patient was maintained on a cardiac monitor.  I personally viewed and interpreted the cardiac monitored which showed an underlying rhythm of: nsr Continuous pulse oximetry interpreted by myself, 97% on 4L.    Social Determinants of Health:  Diagnosis or treatment significantly limited by social determinants of health: current smoker   Reevaluation: After the interventions noted above, I reevaluated the patient and found that they have improved  Co morbidities that complicate the patient evaluation  Past Medical History:  Diagnosis Date   Anxiety    Arthritis    Asthma    Bowel obstruction (HCC)    Chronic pain    COPD (chronic obstructive pulmonary disease) (HCC)    Depression    GERD (gastroesophageal reflux disease)    Hypertension    Stroke (HCC)       Dispostion: Disposition decision including need for hospitalization was considered, and patient admitted to the hospital.    Final Clinical Impression(s) / ED Diagnoses Final diagnoses:  AKI (acute kidney injury) (HCC)  Hypoxia  Hypokalemia  Community acquired pneumonia, unspecified laterality        Sloan Leiter, DO 06/30/23 1612

## 2023-06-30 NOTE — Sepsis Progress Note (Signed)
Confirmed with bedside RN Marylene Land that blood draws were attempted but unsuccessful prior to giving the antibiotics.

## 2023-06-30 NOTE — Assessment & Plan Note (Addendum)
With baseline hemiparesis.Per daughter, patient was placed on xarelto to prevent recurrent stroke.  -Resume aspirin, statin, Xarelto

## 2023-06-30 NOTE — Assessment & Plan Note (Signed)
Creatinine elevated 2.38.  Baseline 0.6-0.9.  Likely due to poor oral intake, sleep.  Lasix also which was just restarted 4 days ago after a 2-week break. -1 L bolus given, continue N/s + 20 kcl 100cc/hr x 15hrs

## 2023-07-01 DIAGNOSIS — J9601 Acute respiratory failure with hypoxia: Secondary | ICD-10-CM | POA: Diagnosis not present

## 2023-07-01 LAB — IRON AND TIBC
Iron: 37 ug/dL (ref 28–170)
Saturation Ratios: 14 % (ref 10.4–31.8)
TIBC: 270 ug/dL (ref 250–450)
UIBC: 233 ug/dL

## 2023-07-01 LAB — BASIC METABOLIC PANEL
Anion gap: 11 (ref 5–15)
BUN: 32 mg/dL — ABNORMAL HIGH (ref 6–20)
CO2: 32 mmol/L (ref 22–32)
Calcium: 8.2 mg/dL — ABNORMAL LOW (ref 8.9–10.3)
Chloride: 93 mmol/L — ABNORMAL LOW (ref 98–111)
Creatinine, Ser: 1.9 mg/dL — ABNORMAL HIGH (ref 0.44–1.00)
GFR, Estimated: 30 mL/min — ABNORMAL LOW (ref >60)
Glucose, Bld: 150 mg/dL — ABNORMAL HIGH (ref 70–99)
Potassium: 2.3 mmol/L — CL (ref 3.5–5.1)
Sodium: 136 mmol/L (ref 135–145)

## 2023-07-01 LAB — CBC
HCT: 28.9 % — ABNORMAL LOW (ref 36.0–46.0)
Hemoglobin: 9 g/dL — ABNORMAL LOW (ref 12.0–15.0)
MCH: 27.5 pg (ref 26.0–34.0)
MCHC: 31.1 g/dL (ref 30.0–36.0)
MCV: 88.4 fL (ref 80.0–100.0)
Platelets: 366 10*3/uL (ref 150–400)
RBC: 3.27 MIL/uL — ABNORMAL LOW (ref 3.87–5.11)
RDW: 14.3 % (ref 11.5–15.5)
WBC: 12.4 10*3/uL — ABNORMAL HIGH (ref 4.0–10.5)
nRBC: 0 % (ref 0.0–0.2)

## 2023-07-01 LAB — FERRITIN: Ferritin: 388 ng/mL — ABNORMAL HIGH (ref 11–307)

## 2023-07-01 LAB — LACTIC ACID, PLASMA: Lactic Acid, Venous: 0.8 mmol/L (ref 0.5–1.9)

## 2023-07-01 LAB — FOLATE: Folate: 21.4 ng/mL (ref >5.9)

## 2023-07-01 LAB — HIV ANTIBODY (ROUTINE TESTING W REFLEX): HIV Screen 4th Generation wRfx: NONREACTIVE

## 2023-07-01 LAB — VITAMIN B12: Vitamin B-12: 4446 pg/mL — ABNORMAL HIGH (ref 180–914)

## 2023-07-01 MED ORDER — NOREPINEPHRINE 4 MG/250ML-% IV SOLN: 0.0000 ug/min | INTRAVENOUS | Status: DC

## 2023-07-01 MED ORDER — POTASSIUM CHLORIDE 10 MEQ/100ML IV SOLN
10.0000 meq | INTRAVENOUS | Status: AC
  Administered 2023-07-01 (×4): 10 meq via INTRAVENOUS
  Filled 2023-07-01 (×4): qty 100

## 2023-07-01 MED ORDER — SODIUM CHLORIDE 0.9 % IV BOLUS
500.0000 mL | Freq: Once | INTRAVENOUS | Status: AC
  Administered 2023-07-01: 500 mL via INTRAVENOUS

## 2023-07-01 MED ORDER — POTASSIUM CHLORIDE CRYS ER 20 MEQ PO TBCR
40.0000 meq | EXTENDED_RELEASE_TABLET | ORAL | Status: AC
  Administered 2023-07-01 (×2): 40 meq via ORAL
  Filled 2023-07-01 (×2): qty 2

## 2023-07-01 MED ORDER — SODIUM CHLORIDE 0.9 % IV SOLN: 250.0000 mL | INTRAVENOUS | Status: AC

## 2023-07-01 MED ORDER — NOREPINEPHRINE 4 MG/250ML-% IV SOLN
2.0000 ug/min | INTRAVENOUS | Status: DC
  Administered 2023-07-01: 2 ug/min via INTRAVENOUS
  Filled 2023-07-01: qty 250

## 2023-07-01 MED ORDER — ORAL CARE MOUTH RINSE: 15.0000 mL | OROMUCOSAL | Status: DC | PRN

## 2023-07-01 NOTE — TOC Initial Note (Addendum)
Transition of Care [facility]) - Inpatient Brief Assessment   Patient Details  Name: Judy Boyd MRN: [id number] Date of Birth: 1961-08-01  [facility] CM/SW Contact:    Leitha Bleak, RN Phone Number: 07/01/2023, 1:03 PM   Clinical Narrative:  Patient admitted with acute hypoxic respiratory failure. CM spoke with her daughter. Patient is active with [facility]. Daughter wishes to continue. MD aware to order. Patient has no PCP listed, accepting provider list added.  No other needs identified. TOC following.   Transition of Care Asessment: Insurance and Status: Insurance coverage has been reviewed Patient has primary care physician: No (List Added) Home environment has been reviewed: Home with daughter's Prior level of function:: need assistance Prior/Current Home Services: Current home services ([facility]) Social Determinants of Health Reivew: SDOH reviewed no interventions necessary Readmission risk has been reviewed: Yes Transition of care needs: no transition of care needs at this time       Activities of Daily Living   ADL Screening (condition at time of admission) Independently performs ADLs?: No Does the patient have a NEW difficulty with bathing/dressing/toileting/self-feeding that is expected to last >3 days?: No (chronic bed-bound pt) Does the patient have a NEW difficulty with getting in/out of bed, walking, or climbing stairs that is expected to last >3 days?: No Does the patient have a NEW difficulty with communication that is expected to last >3 days?: No Is the patient deaf or have difficulty hearing?: No Does the patient have difficulty seeing, even when wearing glasses/contacts?: No Does the patient have difficulty concentrating, remembering, or making decisions?: No     Admission diagnosis:  Hypokalemia [E87.6] Hypoxia [R09.02] AKI (acute kidney injury) (HCC) [N17.9] Community acquired pneumonia, unspecified laterality  [J18.9] Acute hypoxic respiratory failure (HCC) [J96.01] Patient Active Problem List   Diagnosis Date Noted   Acute hypoxic respiratory failure (HCC) 06/30/2023   AKI (acute kidney injury) (HCC) 06/30/2023   Hypokalemia 06/30/2023   Pressure injury of skin 06/30/2023   Acute stroke due to ischemia (HCC) 04/26/2022   Hypertensive urgency 04/26/2022   Nicotine dependence, cigarettes, uncomplicated 04/26/2022   Chest pain 04/26/2022   Aneurysm (HCC) 10/25/2020   Dental disease 07/02/2018   Hemiparesis affecting right side as late effect of stroke (HCC) 06/17/2018   Stroke (cerebrum) (HCC) 06/17/2018   H/O medication noncompliance    Chronic pain syndrome    Leukocytosis    Tobacco abuse 06/13/2018   Lacunar stroke (HCC) 06/12/2018   Hypertension    COPD with acute exacerbation (HCC)    GERD (gastroesophageal reflux disease) 04/06/2012   Nausea & vomiting 04/06/2012   Melena 04/06/2012   PCP:  Oneita Hurt, No Pharmacy:   [facility] - [place], [place] - [address] [place] [address] Phone: [phone] Fax: [phone]  [facility] [place], [place] - [address] [place] [address] Phone: [phone] Fax: [phone]     Social Determinants of Health (SDOH) Social History: SDOH Screenings   Food Insecurity: No Food Insecurity (06/30/2023)  Housing: Low Risk  (06/30/2023)  Transportation Needs: No Transportation Needs (06/30/2023)  Utilities: Not At Risk (06/30/2023)  Depression (PHQ2-9): Medium Risk (06/11/2019)  Financial Resource Strain: Low Risk  (04/27/2022)   Received from [facility], [facility]  Social Connections: Unknown (04/27/2022)   Received from [facility], [facility]  Stress: No Stress Concern Present (04/27/2022)   Received from [facility], [facility]  Tobacco Use: High  Risk (06/30/2023)

## 2023-07-01 NOTE — Progress Notes (Signed)
PROGRESS NOTE  Judy Boyd, is a 62 y.o. female, DOB - 11/17/1960, UUV:[id number]  Admit date - 06/30/2023   Admitting Physician Ejiroghene Wendall Stade, MD  Outpatient Primary MD for the patient is Pcp, No  LOS - 1  Chief Complaint  Patient presents with   Shortness of Breath           Brief Narrative:   62 y.o. female with medical history significant for COPD, hypertension, stroke with residual hemiparesis, anxiety admitted on 06/30/2023 with acute hypoxic respiratory failure due to COPD exacerbation -    -Assessment and Plan: 1) acute COPD exacerbation-- --Continue IV Solu-Medrol, bronchodilators, mucolytics and antibiotics -CT chest without definite pneumonia, evidence of emphysema and diffuse bronchial thickening noted -Cough is not really productive -Dyspnea and wheezing improving  2)Acute hypoxic respiratory failure (HCC) Due to #1 above -Management as above #1 --Weaning off oxygen nicely  3)Acute anemia--- Hgb down to 9.0 from a baseline usually around 12 -Folate is not low, 21.4 -No acute bleeding concerns, -Suspect this is related to hemodilution from IV fluids for AKI  3)Hypokalemia -Due to diuretics including HCTZ and Lasix, poor oral intake and emesis -Replete K IV and oral  4)AKI (acute kidney injury) (HCC) --Likely due to poor oral intake compounded with HCTZ and Lasix use  -Baseline usually around 0.9 -Creatinine peaked at 2,38 this admission -Creatinine-- 2.38 >> 1.90 -Improving with IV fluids  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 -Resume PTA aspirin, statin, Xarelto  Hypertension Stable. -Hold HCTZ, Lasix with AKI  Status is: Inpatient   Code Status :  -  Code Status: Full Code   Family Communication:    (patient is alert, awake and coherent)  Daughter Rashada at bedside  DVT Prophylaxis  :   - SCDs rivaroxaban (XARELTO)  tablet 20 mg   Lab Results  Component Value Date   PLT 366 07/01/2023    Inpatient Medications  Scheduled Meds:  aspirin EC  81 mg Oral Daily   Chlorhexidine Gluconate Cloth  6 each Topical Q0600   citalopram  10 mg Oral Daily   gabapentin  100 mg Oral QHS   methylPREDNISolone (SOLU-MEDROL) injection  60 mg Intravenous Q12H   mirtazapine  15 mg Oral QHS   pantoprazole  40 mg Oral Daily   rivaroxaban  20 mg Oral Q supper   rosuvastatin  40 mg Oral Daily   Continuous Infusions:  sodium chloride     0.9 % NaCl with KCl 20 mEq / L 125 mL/hr at 07/01/23 0819   cefTRIAXone (ROCEPHIN)  IV Stopped (06/30/23 1739)   norepinephrine (LEVOPHED) Adult infusion Stopped (07/01/23 1002)   PRN Meds:.acetaminophen **OR** acetaminophen, HYDROcodone-acetaminophen, ipratropium-albuterol, mouth rinse, polyethylene glycol, promethazine, tiZANidine   Anti-infectives (From admission, onward)    Start     Dose/Rate Route Frequency Ordered Stop   06/30/23 1530  cefTRIAXone (ROCEPHIN) 2 g in sodium chloride 0.9 % 100 mL IVPB        2 g 200 mL/hr over 30 Minutes Intravenous Every 24 hours 06/30/23 1516 07/05/23 1529   06/30/23 1530  azithromycin (ZITHROMAX) 500 mg in dextrose 5 % 250 mL IVPB  Status:  Discontinued        500 mg 250 mL/hr over 60 Minutes Intravenous Every 24 hours 06/30/23 1516 06/30/23 2317       Subjective: Karen Chafe today has no fevers, no further emesis,  No chest pain,  -  Cough is not productive- Complains of fatigue and malaise  Objective: Vitals:   07/01/23 1400 07/01/23 1430 07/01/23 1500 07/01/23 1530  BP: 123/61 114/68 120/65 (!) 147/52  Pulse: 80 71 73 79  Resp: 17 14 18 18   Temp:      TempSrc:      SpO2: 98% 97% 96% 95%  Weight:      Height:        Intake/Output Summary (Last 24 hours) at 07/01/2023 1543 Last data filed at 07/01/2023 1519 Gross per 24 hour  Intake 1992.71 ml  Output 2700 ml  Net -707.29 ml   Filed Weights   06/30/23 1329 06/30/23  1900  Weight: 62.6 kg 85.5 kg    Physical Exam Gen:- Awake Alert, in no acute distress HEENT:- [place].AT, No sclera icterus Neck-Supple Neck,No JVD,.  Lungs-  fair symmetrical air movement, no wheezing, no rhonchi CV- S1, S2 normal, regular  Abd-  +ve B.Sounds, Abd Soft, No tenderness,    Extremity/Skin:- +ve  edema, pedal pulses present  Psych-affect is appropriate, oriented x3 Neuro-residual Lt sided hemi-paresis and dysarthria , no additional new focal deficits, no tremors  Data Reviewed: I have personally reviewed following labs and imaging studies  CBC: Recent Labs  Lab 06/30/23 1410 07/01/23 0449  WBC 17.1* 12.4*  NEUTROABS 13.3*  --   HGB 12.2 9.0*  HCT 39.2 28.9*  MCV 86.3 88.4  PLT 453* 366   Basic Metabolic Panel: Recent Labs  Lab 06/30/23 1410 06/30/23 1629 07/01/23 0230  NA 134*  --  136  K 2.0*  --  2.3*  CL 77*  --  93*  CO2 39*  --  32  GLUCOSE 132*  --  150*  BUN 39*  --  32*  CREATININE 2.38*  --  1.90*  CALCIUM 9.7  --  8.2*  MG  --  1.8  --    GFR: Estimated Creatinine Clearance: 36 mL/min (A) (by C-G formula based on SCr of 1.9 mg/dL (H)).  Recent Results (from the past 240 hour(s))  Resp panel by RT-PCR (RSV, Flu A&B, Covid) Anterior Nasal Swab     Status: None   Collection Time: 06/30/23  1:57 PM   Specimen: Anterior Nasal Swab  Result Value Ref Range Status   SARS Coronavirus 2 by RT PCR NEGATIVE NEGATIVE Final    Comment: (NOTE) SARS-CoV-2 target nucleic acids are NOT DETECTED.  The SARS-CoV-2 RNA is generally detectable in upper respiratory specimens during the acute phase of infection. The lowest concentration of SARS-CoV-2 viral copies this assay can detect is 138 copies/mL. A negative result does not preclude SARS-Cov-2 infection and should not be used as the sole basis for treatment or other patient management decisions. A negative result may occur with  improper specimen collection/handling, submission of specimen other than  nasopharyngeal swab, presence of viral mutation(s) within the areas targeted by this assay, and inadequate number of viral copies(<138 copies/mL). A negative result must be combined with clinical observations, patient history, and epidemiological information. The expected result is Negative.  Fact Sheet for Patients:  BloggerCourse.com  Fact Sheet for Healthcare Providers:  SeriousBroker.it  This test is no t yet approved or cleared by the [place] FDA and  has been authorized for detection and/or diagnosis of SARS-CoV-2 by FDA under an Emergency Use Authorization (EUA). This EUA will remain  in effect (meaning this test can be used) for the duration of the COVID-19 declaration under Section 564(b)(1) of the Act, 21 U.S.C.section 360bbb-3(b)(1),  unless the authorization is terminated  or revoked sooner.       Influenza A by PCR NEGATIVE NEGATIVE Final   Influenza B by PCR NEGATIVE NEGATIVE Final    Comment: (NOTE) The Xpert Xpress SARS-CoV-2/FLU/RSV plus assay is intended as an aid in the diagnosis of influenza from Nasopharyngeal swab specimens and should not be used as a sole basis for treatment. Nasal washings and aspirates are unacceptable for Xpert Xpress SARS-CoV-2/FLU/RSV testing.  Fact Sheet for Patients: BloggerCourse.com  Fact Sheet for Healthcare Providers: SeriousBroker.it  This test is not yet approved or cleared by the [place] FDA and has been authorized for detection and/or diagnosis of SARS-CoV-2 by FDA under an Emergency Use Authorization (EUA). This EUA will remain in effect (meaning this test can be used) for the duration of the COVID-19 declaration under Section 564(b)(1) of the Act, 21 U.S.C. section 360bbb-3(b)(1), unless the authorization is terminated or revoked.     Resp Syncytial Virus by PCR NEGATIVE NEGATIVE Final    Comment:  (NOTE) Fact Sheet for Patients: BloggerCourse.com  Fact Sheet for Healthcare Providers: SeriousBroker.it  This test is not yet approved or cleared by the [place] FDA and has been authorized for detection and/or diagnosis of SARS-CoV-2 by FDA under an Emergency Use Authorization (EUA). This EUA will remain in effect (meaning this test can be used) for the duration of the COVID-19 declaration under Section 564(b)(1) of the Act, 21 U.S.C. section 360bbb-3(b)(1), unless the authorization is terminated or revoked.  Performed at [facility], [address] [place], [place] [address]   Blood Culture (routine x 2)     Status: None (Preliminary result)   Collection Time: 06/30/23  4:29 PM   Specimen: BLOOD  Result Value Ref Range Status   Specimen Description BLOOD RIGHT ANTECUBITAL  Final   Special Requests   Final    BOTTLES DRAWN AEROBIC AND ANAEROBIC Blood Culture results may not be optimal due to an excessive volume of blood received in culture bottles   Culture   Final    NO GROWTH < 24 HOURS Performed at [facility], [address] [place], [place] [address]    Report Status PENDING  Incomplete  Blood Culture (routine x 2)     Status: None (Preliminary result)   Collection Time: 06/30/23  4:29 PM   Specimen: BLOOD  Result Value Ref Range Status   Specimen Description BLOOD BLOOD RIGHT HAND  Final   Special Requests   Final    BOTTLES DRAWN AEROBIC AND ANAEROBIC Blood Culture results may not be optimal due to an excessive volume of blood received in culture bottles   Culture   Final    NO GROWTH < 24 HOURS Performed at [facility], [address] [place], [place] [address]    Report Status PENDING  Incomplete  MRSA Next Gen by PCR, Nasal     Status: None   Collection Time: 06/30/23  6:54 PM   Specimen: Nasal Mucosa; Nasal Swab  Result Value Ref Range Status   MRSA by PCR Next Gen NOT DETECTED NOT DETECTED Final     Comment: (NOTE) The GeneXpert MRSA Assay (FDA approved for NASAL specimens only), is one component of a comprehensive MRSA colonization surveillance program. It is not intended to diagnose MRSA infection nor to guide or monitor treatment for MRSA infections. Test performance is not FDA approved in patients less than 46 years old. Performed at [facility], [address] [place], [place] [address]  Radiology Studies: CT CHEST WO CONTRAST  Result Date: 06/30/2023 CLINICAL DATA:  Worsening dyspnea, hypoxia, leukocytosis and shortness of breath. Abnormal chest x-ray last week elsewhere. EXAM: CT CHEST WITHOUT CONTRAST TECHNIQUE: Multidetector CT imaging of the chest was performed following the standard protocol without IV contrast. RADIATION DOSE REDUCTION: This exam was performed according to the departmental dose-optimization program which includes automated exposure control, adjustment of the mA and/or kV according to patient size and/or use of iterative reconstruction technique. COMPARISON:  Portable chest today, portable chest 04/26/2022, and CTA chest 04/28/2023, CTA neck 06/13/2018. FINDINGS: Cardiovascular: The cardiac size normal. There are patchy three-vessel coronary artery calcifications. No pericardial effusion. The pulmonary trunk is upper limits of normal in caliber. The pulmonary veins are nondistended. There is atherosclerosis in the aorta and great vessels. No aortic aneurysm. Mediastinum/Nodes: There is a 2.4 cm nodule posteriorly in the lower pole of the right thyroid lobe. This is stable in size compared to both of the prior studies but has never been further evaluated. Nonemergent ultrasound follow-up is recommended. There is no intrathoracic or axillary adenopathy or other visible thyroid mass. The thoracic trachea, thoracic esophagus, both main bronchi are unremarkable. Lungs/Pleura: Mild-to-moderate emphysematous disease with centrilobular changes predominating. Mild chronic  elevation right hemidiaphragm. Posterior subsegmental atelectasis both lung bases is noted. No nodules or active infiltrates are seen. Diffuse bronchial thickening appears similar. Upper Abdomen: Stable 1.2 cm cyst in hepatic segment 7, Hounsfield density is 2.6. No acute abnormality. Musculoskeletal: No abnormality of the visualized chest wall. No acute or significant osseous findings. IMPRESSION: 1. No acute noncontrast chest CT findings. 2. Emphysema and diffuse bronchial thickening. 3. Aortic and coronary artery atherosclerosis. 4. 2.4 cm right thyroid nodule. Nonemergent follow-up ultrasound recommended. 5. Stable 1.2 cm hepatic cyst. Aortic Atherosclerosis (ICD10-I70.0) and Emphysema (ICD10-J43.9). Electronically Signed   By: Almira Bar M.D.   On: 06/30/2023 21:39   DG Chest Port 1 View  Result Date: 06/30/2023 CLINICAL DATA:  Shortness of breath. EXAM: PORTABLE CHEST 1 VIEW COMPARISON:  CT chest dated April 27, 2022. Chest x-ray dated April 26, 2022. FINDINGS: The heart size and mediastinal contours are within normal limits. Normal pulmonary vascularity. Low lung volumes with minimal bibasilar atelectasis. No focal consolidation, pleural effusion, or pneumothorax. No acute osseous abnormality. IMPRESSION: 1. Low lung volumes with minimal bibasilar atelectasis. Electronically Signed   By: Obie Dredge M.D.   On: 06/30/2023 17:28    Scheduled Meds:  aspirin EC  81 mg Oral Daily   Chlorhexidine Gluconate Cloth  6 each Topical Q0600   citalopram  10 mg Oral Daily   gabapentin  100 mg Oral QHS   methylPREDNISolone (SOLU-MEDROL) injection  60 mg Intravenous Q12H   mirtazapine  15 mg Oral QHS   pantoprazole  40 mg Oral Daily   rivaroxaban  20 mg Oral Q supper   rosuvastatin  40 mg Oral Daily   Continuous Infusions:  sodium chloride     0.9 % NaCl with KCl 20 mEq / L 125 mL/hr at 07/01/23 0819   cefTRIAXone (ROCEPHIN)  IV Stopped (06/30/23 1739)   norepinephrine (LEVOPHED) Adult  infusion Stopped (07/01/23 1002)    LOS: 1 day   Shon Hale M.D on 07/01/2023 at 3:43 PM  Go to [email or web address] - for contact info  [facility] - Office  [phone]  If 7PM-7AM, please contact night-coverage [email or web address] 07/01/2023, 3:43 PM

## 2023-07-01 NOTE — Discharge Instructions (Signed)
  Providers Accepting New Patients in [place], [place]    [facility] [address]  [place], [place] [address] [phone] Accepts most insurances  [facility] [address] [place], [place] [address] [phone] Accepts most insurances  [facility] [address] [place], [place] [address]  [phone] Must meet requirements  [facility] [address] [place], [place] [address] [phone] Accepts most insurances  [facility] [address]  [place], [place] [address] [phone] Accepts most insurances  [facility] [address]   [place], [place]   [phone] Accepts most insurances  [facility])  [phone] [address]  [place], [place] [address] [phone] Accepts most insurances     [facility] [address]  [place], [place] [address] [phone] Accepts most insurances  [facility] [address] [place], [place] [address] [phone] option 1 Accepts Medicaid and [facility] [address]  [place], [place] [address] [phone] Accepts most insurances  Avon Gully, MD [address] [place], [place] [address] [phone] Accepts most insurances  [facility] [address]  [place], [place] [address] [phone] Accepts most insurances  [facility] [phone] [place], [place] [address] [phone] Accepts most insurances  [place] [place] [facility], [address] [place], [place] [address] [phone]  Accepts most insurances       1)Drink enough fluids to avoid dehydration 2)Stop HCTZ/hydrochlorothiazide 3)Repeat BMP blood test primary care physician within 1 week 4)Avoid ibuprofen/Advil/Aleve/Motrin/Goody Powders/Naproxen/BC powders/Meloxicam/Diclofenac/Indomethacin and other  Nonsteroidal anti-inflammatory medications as these will make you more likely to bleed and can cause stomach ulcers, can also cause Kidney problems.  5)Use TEDs/compression stockings to help your leg swelling

## 2023-07-02 DIAGNOSIS — J9601 Acute respiratory failure with hypoxia: Secondary | ICD-10-CM | POA: Diagnosis not present

## 2023-07-02 LAB — COMPREHENSIVE METABOLIC PANEL
ALT: 16 U/L (ref 0–44)
AST: 27 U/L (ref 15–41)
Albumin: 3.1 g/dL — ABNORMAL LOW (ref 3.5–5.0)
Alkaline Phosphatase: 65 U/L (ref 38–126)
Anion gap: 13 (ref 5–15)
BUN: 27 mg/dL — ABNORMAL HIGH (ref 6–20)
CO2: 30 mmol/L (ref 22–32)
Calcium: 9.3 mg/dL (ref 8.9–10.3)
Chloride: 97 mmol/L — ABNORMAL LOW (ref 98–111)
Creatinine, Ser: 1.55 mg/dL — ABNORMAL HIGH (ref 0.44–1.00)
GFR, Estimated: 38 mL/min — ABNORMAL LOW (ref >60)
Glucose, Bld: 143 mg/dL — ABNORMAL HIGH (ref 70–99)
Potassium: 3.1 mmol/L — ABNORMAL LOW (ref 3.5–5.1)
Sodium: 140 mmol/L (ref 135–145)
Total Bilirubin: 0.4 mg/dL (ref <1.2)
Total Protein: 6.6 g/dL (ref 6.5–8.1)

## 2023-07-02 LAB — CBC
HCT: 29.6 % — ABNORMAL LOW (ref 36.0–46.0)
Hemoglobin: 9.2 g/dL — ABNORMAL LOW (ref 12.0–15.0)
MCH: 27.2 pg (ref 26.0–34.0)
MCHC: 31.1 g/dL (ref 30.0–36.0)
MCV: 87.6 fL (ref 80.0–100.0)
Platelets: 362 10*3/uL (ref 150–400)
RBC: 3.38 MIL/uL — ABNORMAL LOW (ref 3.87–5.11)
RDW: 15 % (ref 11.5–15.5)
WBC: 15.5 10*3/uL — ABNORMAL HIGH (ref 4.0–10.5)
nRBC: 0 % (ref 0.0–0.2)

## 2023-07-02 MED ORDER — POTASSIUM CHLORIDE CRYS ER 20 MEQ PO TBCR
40.0000 meq | EXTENDED_RELEASE_TABLET | ORAL | Status: AC
  Administered 2023-07-02 (×2): 40 meq via ORAL
  Filled 2023-07-02 (×2): qty 2

## 2023-07-02 MED ORDER — POTASSIUM CHLORIDE CRYS ER 20 MEQ PO TBCR
40.0000 meq | EXTENDED_RELEASE_TABLET | Freq: Once | ORAL | Status: AC
  Administered 2023-07-02: 40 meq via ORAL
  Filled 2023-07-02: qty 2

## 2023-07-02 MED ORDER — WIXELA INHUB 250-50 MCG/ACT IN AEPB: 1.0000 | INHALATION_SPRAY | Freq: Two times a day (BID) | RESPIRATORY_TRACT | 12 refills | Status: AC

## 2023-07-02 MED ORDER — ACETAMINOPHEN 325 MG PO TABS: 650.0000 mg | ORAL_TABLET | Freq: Four times a day (QID) | ORAL | 0 refills | Status: AC | PRN

## 2023-07-02 MED ORDER — PREDNISONE 20 MG PO TABS: 40.0000 mg | ORAL_TABLET | Freq: Every day | ORAL | 0 refills | Status: AC

## 2023-07-02 MED ORDER — ALBUTEROL SULFATE HFA 108 (90 BASE) MCG/ACT IN AERS: 2.0000 | INHALATION_SPRAY | Freq: Four times a day (QID) | RESPIRATORY_TRACT | 2 refills | Status: AC | PRN

## 2023-07-02 MED ORDER — POTASSIUM CHLORIDE ER 20 MEQ PO TBCR: 20.0000 meq | EXTENDED_RELEASE_TABLET | Freq: Every day | ORAL | 0 refills | Status: AC

## 2023-07-02 MED ORDER — DOXYCYCLINE HYCLATE 100 MG PO TABS: 100.0000 mg | ORAL_TABLET | Freq: Two times a day (BID) | ORAL | 0 refills | Status: AC

## 2023-07-02 NOTE — Care Management Important Message (Signed)
Important Message  Patient Details  Name: Judy Boyd MRN: [id number] Date of Birth: [birth year]   Important Message Given:  N/A - LOS <3 / Initial given by admissions     Corey Harold 07/02/2023, 4:28 PM

## 2023-07-02 NOTE — Discharge Summary (Addendum)
Judy Boyd, is a 62 y.o. female  DOB Jun 24, 1961  MRN [id number].  Admission date:  06/30/2023  Admitting Physician  Onnie Boer, MD  Discharge Date:  07/02/2023   Primary MD  Pcp, No  Recommendations for primary care physician for things to follow:   1)Drink enough fluids to avoid dehydration 2)Stop HCTZ/hydrochlorothiazide 3)Repeat BMP blood test primary care physician within 1 week 4)Avoid ibuprofen/Advil/Aleve/Motrin/Goody Powders/Naproxen/BC powders/Meloxicam/Diclofenac/Indomethacin and other Nonsteroidal anti-inflammatory medications as these will make you more likely to bleed and can cause stomach ulcers, can also cause Kidney problems.  5)Use TEDs/compression stockings to help your leg swelling  Admission Diagnosis  Hypokalemia [E87.6] Hypoxia [R09.02] AKI (acute kidney injury) (HCC) [N17.9] Community acquired pneumonia, unspecified laterality [J18.9] Acute hypoxic respiratory failure (HCC) [J96.01]   Discharge Diagnosis  Hypokalemia [E87.6] Hypoxia [R09.02] AKI (acute kidney injury) (HCC) [N17.9] Community acquired pneumonia, unspecified laterality [J18.9] Acute hypoxic respiratory failure (HCC) [J96.01]    Principal Problem:   Acute hypoxic respiratory failure (HCC) Active Problems:   AKI (acute kidney injury) (HCC)   Hypokalemia   Hypertension   Hemiparesis affecting right side as late effect of stroke (HCC)   Stroke (cerebrum) (HCC)   COPD with acute exacerbation (HCC)   Pressure injury of skin      Past Medical History:  Diagnosis Date   Anxiety    Arthritis    Asthma    Bowel obstruction (HCC)    Chronic pain    COPD (chronic obstructive pulmonary disease) (HCC)    Depression    GERD (gastroesophageal reflux disease)    Hypertension    Stroke [facility])     Past Surgical History:  Procedure Laterality Date   ABDOMINAL SURGERY  2003   blockage   left  salpingectomy     uterine ablation       HPI  from the history and physical done on the day of admission:   Chief Complaint: Increasing difficulty breathing   HPI: Judy Boyd is a 62 y.o. female with medical history significant for COPD, hypertension, stroke with residual hemiparesis, anxiety. Patient is able to answer questions, but she has baseline speech impairment.  Daughter Darl Householder at bedside assist with history.  Patient presented to the ED with complaints of increasing difficulty breathing, over the past week.  No cough.  Daughter reports O2 sats 80s to 89% since onset of symptoms, patient is not on chronic O2.  She has also had poor oral intake over the past several days.  1 episode of vomiting today, no diarrhea.  No chest or abdominal pain.  She has had bilateral lower extremity swelling intermittent over the past year, for which she was placed on Lasix, but 2 weeks ago this was discontinued and then resumed 4 days ago. Last week, patient was diagnosed with an infection in her chest and started on a course of doxycycline, today is patient's 4th day of a 10day course.  She is also on Xarelto and compliant. Patient is not ambulatory.   ED Course: Tmax 98.8.  Respiratory 18-26.  Heart rate 96-101.  Blood pressure systolic 120s to 474.  O2 sat 88% on room air, placed on 4 L. Low Potassium 2. Elevated creatinine 2.38.  Leukocytosis of 17.1.  Troponin 19.  COVID test negative EDP reports wheezing on auscultation and reduced breath sounds. Chest x-ray pending IV ceftriaxone and azithromycin started. 1 L bolus given.  Hospitalist to admit for acute hypoxic respiratory failure, AKI and hypokalemia.   Review of Systems: As per HPI all other systems reviewed and negative.   Hospital Course:   1) acute COPD exacerbation-- --treated with iV Solu-Medrol, bronchodilators, mucolytics and antibiotics -CT chest without definite pneumonia, however there is evidence of emphysema and diffuse  bronchial thickening noted -No further wheezing -Cough is improved significantly -No significant dyspnea at rest and hypoxia resolved -Okay to discharge on doxycycline and prednisone -Avoid tobacco exposure   2)Acute hypoxic respiratory failure (HCC) Due to #1 above -Management as above #1 -Hypoxia resolved -O2 sats 96 to 98% on room air   3) acute anemia--- Hgb down to 9.2 from a baseline usually around 12 -Folate is not low, 21.4 -Suspect some component of hemodilution from IV fluids -No obvious Bleeding concerns -Repeat CBC as outpatient   4)Hypokalemia - Likely from diuretics and poor oral intake.  She reports 1 episode of vomiting -Potassium replacements as ordered   5)AKI (acute kidney injury) (HCC) --Likely due to poor oral intake compounded with HCTZ and Lasix use  -Baseline usually around 0.9 -Creatinine peaked at 2,38 this admission -Creatinine-- 2.38 >> 1.90 >> 1.55 -Repeat BMP within a week as outpatient  6)H/o Prior stroke (cerebrum) (HCC) With baseline Lt sided hemiparesis. -Per daughter PTA pt was on aspirin, crestor and  Xarelto   Hypertension Stable. -Resume amlodipine -Discontinue HCTZ due to hyponatremia hypokalemia and electrolyte derangement with AKI -May use Lasix as needed leg swelling   Discharge Condition: stable  Follow UP   Follow-up Information     [facility] Follow up.   Why: PT will call to schedule your next visit.                Diet and Activity recommendation:  As advised  Discharge Instructions   Discharge Instructions     Call MD for:  difficulty breathing, headache or visual disturbances   Complete by: As directed    Call MD for:  persistant dizziness or light-headedness   Complete by: As directed    Call MD for:  persistant nausea and vomiting   Complete by: As directed    Call MD for:  temperature >100.4   Complete by: As directed    Diet - low sodium heart healthy   Complete by: As  directed    Discharge instructions   Complete by: As directed    1)Drink enough fluids to avoid dehydration 2)Stop HCTZ/hydrochlorothiazide 3)Repeat BMP blood test primary care physician within 1 week 4)Avoid ibuprofen/Advil/Aleve/Motrin/Goody Powders/Naproxen/BC powders/Meloxicam/Diclofenac/Indomethacin and other Nonsteroidal anti-inflammatory medications as these will make you more likely to bleed and can cause stomach ulcers, can also cause Kidney problems.  5)Use TEDs/compression stockings to help your leg swelling   Discharge wound care:   Complete by: As directed    Foam dressing as advised to affected area   Increase activity slowly   Complete by: As directed          Discharge Medications     Allergies as of 07/02/2023       Reactions   Ibuprofen Other (See  Comments)   Unknown   Tramadol Other (See Comments)   unknown        Medication List     STOP taking these medications    doxycycline 100 MG capsule Commonly known as: VIBRAMYCIN Replaced by: doxycycline 100 MG tablet   hydrochlorothiazide 25 MG tablet Commonly known as: HYDRODIURIL   Symbicort 160-4.5 MCG/ACT inhaler Generic drug: budesonide-formoterol Replaced by: Monte Fantasia Inhub 250-50 MCG/ACT Aepb       TAKE these medications    acetaminophen 325 MG tablet Commonly known as: TYLENOL Take 2 tablets (650 mg total) by mouth every 6 (six) hours as needed for mild pain (pain score 1-3) (or Fever >/= 101).   albuterol 108 (90 Base) MCG/ACT inhaler Commonly known as: VENTOLIN HFA Inhale 2 puffs into the lungs every 6 (six) hours as needed for shortness of breath or wheezing. Shortness of breath What changed: reasons to take this   amLODipine 10 MG tablet Commonly known as: NORVASC Take 10 mg by mouth daily.   aspirin EC 81 MG tablet Take 81 mg by mouth daily.   B-12 PO Take 1 tablet by mouth daily.   Centrum Silver 50+Women Tabs Take 1 tablet by mouth daily.   citalopram 10 MG  tablet Commonly known as: CELEXA Take 10 mg by mouth daily.   doxycycline 100 MG tablet Commonly known as: VIBRA-TABS Take 1 tablet (100 mg total) by mouth 2 (two) times daily for 5 days. Replaces: doxycycline 100 MG capsule   esomeprazole 20 MG capsule Commonly known as: NEXIUM Take 20 mg by mouth every morning.   furosemide 20 MG tablet Commonly known as: LASIX Take 20 mg by mouth daily as needed for fluid or edema.   gabapentin 100 MG capsule Commonly known as: NEURONTIN Take 100 mg by mouth at bedtime.   HYDROcodone-acetaminophen 5-325 MG tablet Commonly known as: NORCO/VICODIN Take 1 tablet by mouth every 8 (eight) hours as needed for moderate pain (pain score 4-6).   mirtazapine 15 MG tablet Commonly known as: REMERON Take 15 mg by mouth at bedtime.   Potassium Chloride ER 20 MEQ Tbcr Take 1 tablet (20 mEq total) by mouth daily for 3 days. 1 tab daily by mouth   predniSONE 20 MG tablet Commonly known as: DELTASONE Take 2 tablets (40 mg total) by mouth daily with breakfast for 5 days.   rosuvastatin 40 MG tablet Commonly known as: CRESTOR Take 40 mg by mouth daily.   tiZANidine 4 MG tablet Commonly known as: ZANAFLEX Take 4 mg by mouth 2 (two) times daily as needed for muscle spasms.   Wixela Inhub 250-50 MCG/ACT Aepb Generic drug: fluticasone-salmeterol Inhale 1 puff into the lungs in the morning and at bedtime. Replaces: Symbicort 160-4.5 MCG/ACT inhaler   Xarelto 20 MG Tabs tablet Generic drug: rivaroxaban Take 20 mg by mouth daily with supper.               Discharge Care Instructions  (From admission, onward)           Start     Ordered   07/02/23 0000  Discharge wound care:       Comments: Foam dressing as advised to affected area   07/02/23 1454            Major procedures and Radiology Reports - PLEASE review detailed and final reports for all details, in brief -  CT CHEST WO CONTRAST  Result Date: 06/30/2023 CLINICAL  DATA:  Worsening dyspnea, hypoxia, leukocytosis and shortness  of breath. Abnormal chest x-ray last week elsewhere. EXAM: CT CHEST WITHOUT CONTRAST TECHNIQUE: Multidetector CT imaging of the chest was performed following the standard protocol without IV contrast. RADIATION DOSE REDUCTION: This exam was performed according to the departmental dose-optimization program which includes automated exposure control, adjustment of the mA and/or kV according to patient size and/or use of iterative reconstruction technique. COMPARISON:  Portable chest today, portable chest 04/26/2022, and CTA chest 04/28/2023, CTA neck 06/13/2018. FINDINGS: Cardiovascular: The cardiac size normal. There are patchy three-vessel coronary artery calcifications. No pericardial effusion. The pulmonary trunk is upper limits of normal in caliber. The pulmonary veins are nondistended. There is atherosclerosis in the aorta and great vessels. No aortic aneurysm. Mediastinum/Nodes: There is a 2.4 cm nodule posteriorly in the lower pole of the right thyroid lobe. This is stable in size compared to both of the prior studies but has never been further evaluated. Nonemergent ultrasound follow-up is recommended. There is no intrathoracic or axillary adenopathy or other visible thyroid mass. The thoracic trachea, thoracic esophagus, both main bronchi are unremarkable. Lungs/Pleura: Mild-to-moderate emphysematous disease with centrilobular changes predominating. Mild chronic elevation right hemidiaphragm. Posterior subsegmental atelectasis both lung bases is noted. No nodules or active infiltrates are seen. Diffuse bronchial thickening appears similar. Upper Abdomen: Stable 1.2 cm cyst in hepatic segment 7, Hounsfield density is 2.6. No acute abnormality. Musculoskeletal: No abnormality of the visualized chest wall. No acute or significant osseous findings. IMPRESSION: 1. No acute noncontrast chest CT findings. 2. Emphysema and diffuse bronchial thickening. 3.  Aortic and coronary artery atherosclerosis. 4. 2.4 cm right thyroid nodule. Nonemergent follow-up ultrasound recommended. 5. Stable 1.2 cm hepatic cyst. Aortic Atherosclerosis (ICD10-I70.0) and Emphysema (ICD10-J43.9). Electronically Signed   By: Almira Bar M.D.   On: 06/30/2023 21:39   DG Chest Port 1 View  Result Date: 06/30/2023 CLINICAL DATA:  Shortness of breath. EXAM: PORTABLE CHEST 1 VIEW COMPARISON:  CT chest dated April 27, 2022. Chest x-ray dated April 26, 2022. FINDINGS: The heart size and mediastinal contours are within normal limits. Normal pulmonary vascularity. Low lung volumes with minimal bibasilar atelectasis. No focal consolidation, pleural effusion, or pneumothorax. No acute osseous abnormality. IMPRESSION: 1. Low lung volumes with minimal bibasilar atelectasis. Electronically Signed   By: Obie Dredge M.D.   On: 06/30/2023 17:28    Micro Results   Recent Results (from the past 240 hour(s))  Resp panel by RT-PCR (RSV, Flu A&B, Covid) Anterior Nasal Swab     Status: None   Collection Time: 06/30/23  1:57 PM   Specimen: Anterior Nasal Swab  Result Value Ref Range Status   SARS Coronavirus 2 by RT PCR NEGATIVE NEGATIVE Final    Comment: (NOTE) SARS-CoV-2 target nucleic acids are NOT DETECTED.  The SARS-CoV-2 RNA is generally detectable in upper respiratory specimens during the acute phase of infection. The lowest concentration of SARS-CoV-2 viral copies this assay can detect is 138 copies/mL. A negative result does not preclude SARS-Cov-2 infection and should not be used as the sole basis for treatment or other patient management decisions. A negative result may occur with  improper specimen collection/handling, submission of specimen other than nasopharyngeal swab, presence of viral mutation(s) within the areas targeted by this assay, and inadequate number of viral copies(<138 copies/mL). A negative result must be combined with clinical observations,  patient history, and epidemiological information. The expected result is Negative.  Fact Sheet for Patients:  BloggerCourse.com  Fact Sheet for Healthcare Providers:  SeriousBroker.it  This test is no  t yet approved or cleared by the [place] and  has been authorized for detection and/or diagnosis of SARS-CoV-2 by FDA under an Emergency Use Authorization (EUA). This EUA will remain  in effect (meaning this test can be used) for the duration of the COVID-19 declaration under Section 564(b)(1) of the Act, 21 U.S.C.section 360bbb-3(b)(1), unless the authorization is terminated  or revoked sooner.       Influenza A by PCR NEGATIVE NEGATIVE Final   Influenza B by PCR NEGATIVE NEGATIVE Final    Comment: (NOTE) The Xpert Xpress SARS-CoV-2/FLU/RSV plus assay is intended as an aid in the diagnosis of influenza from Nasopharyngeal swab specimens and should not be used as a sole basis for treatment. Nasal washings and aspirates are unacceptable for Xpert Xpress SARS-CoV-2/FLU/RSV testing.  Fact Sheet for Patients: BloggerCourse.com  Fact Sheet for Healthcare Providers: SeriousBroker.it  This test is not yet approved or cleared by the [place] FDA and has been authorized for detection and/or diagnosis of SARS-CoV-2 by FDA under an Emergency Use Authorization (EUA). This EUA will remain in effect (meaning this test can be used) for the duration of the COVID-19 declaration under Section 564(b)(1) of the Act, 21 U.S.C. section 360bbb-3(b)(1), unless the authorization is terminated or revoked.     Resp Syncytial Virus by PCR NEGATIVE NEGATIVE Final    Comment: (NOTE) Fact Sheet for Patients: BloggerCourse.com  Fact Sheet for Healthcare Providers: SeriousBroker.it  This test is not yet approved or cleared by the [place] FDA and has been authorized for detection and/or diagnosis of SARS-CoV-2 by FDA under an Emergency Use Authorization (EUA). This EUA will remain in effect (meaning this test can be used) for the duration of the COVID-19 declaration under Section 564(b)(1) of the Act, 21 U.S.C. section 360bbb-3(b)(1), unless the authorization is terminated or revoked.  Performed at [facility], [address], [place], [place] [address]   Blood Culture (routine x 2)     Status: None (Preliminary result)   Collection Time: 06/30/23  4:29 PM   Specimen: BLOOD  Result Value Ref Range Status   Specimen Description   Final    BLOOD RIGHT ANTECUBITAL Performed at [facility], [address], [place], [place] [address]    Special Requests   Final    BOTTLES DRAWN AEROBIC AND ANAEROBIC Blood Culture results may not be optimal due to an excessive volume of blood received in culture bottles Performed at [facility], [address] [place], [place] [address]    Culture   Final    NO GROWTH 2 DAYS Performed at [facility], [address] [place], [place] [address]    Report Status PENDING  Incomplete  Blood Culture (routine x 2)     Status: None (Preliminary result)   Collection Time: 06/30/23  4:29 PM   Specimen: BLOOD  Result Value Ref Range Status   Specimen Description   Final    BLOOD BLOOD RIGHT HAND Performed at [facility], [address] [place], [place] [address]    Special Requests   Final    BOTTLES DRAWN AEROBIC AND ANAEROBIC Blood Culture results may not be optimal due to an excessive volume of blood received in culture bottles Performed at [facility], [address], [place], [place] [address]    Culture   Final    NO GROWTH 2 DAYS Performed at [facility], [address] [place], [place] [address]    Report Status PENDING  Incomplete  MRSA Next Gen by PCR, Nasal     Status: None   Collection Time: 06/30/23  6:54 PM   Specimen: Nasal Mucosa; Nasal Swab   Result Value Ref Range Status   MRSA by PCR Next Gen NOT DETECTED NOT DETECTED Final    Comment: (NOTE) The GeneXpert MRSA Assay (FDA approved for NASAL specimens only), is one component of a comprehensive MRSA colonization surveillance program. It is not intended to diagnose MRSA infection nor to guide or monitor treatment for MRSA infections. Test performance is not FDA approved in patients less than 54 years old. Performed at [facility], [address], [place], [place] [address]     Today   Subjective    Lekeya Jambor today has no new complaints Pts daughter Darl Householder, pt's sister Riley Lam and niece are at bedside -- Eating or drinking well -Voiding well        Patient has been seen and examined prior to discharge   Objective   Blood pressure 109/69, pulse 88, temperature 98.1 F (36.7 C), temperature source Oral, resp. rate (!) 21, height 5\' 8"  (1.727 m), weight 85.5 kg, SpO2 97%.   Intake/Output Summary (Last 24 hours) at 07/02/2023 1459 Last data filed at 07/02/2023 0440 Gross per 24 hour  Intake 424.68 ml  Output 1700 ml  Net -1275.32 ml    Exam Gen:- Awake Alert, no acute distress  HEENT:- [place].AT, No sclera icterus Neck-Supple Neck,No JVD,.  Lungs-  CTAB , good air movement bilaterally CV- S1, S2 normal, regular Abd-  +ve B.Sounds, Abd Soft, No tenderness,    Extremity/Skin:- No significant edema,   good pulses, hirsutism Psych-affect is appropriate, oriented x3 Neuro-residual left-sided hemiparesis, no additional new focal deficits, no tremors    Data Review   CBC w Diff:  Lab Results  Component Value Date   WBC 15.5 (H) 07/02/2023   HGB 9.2 (L) 07/02/2023   HCT 29.6 (L) 07/02/2023   PLT 362 07/02/2023   LYMPHOPCT 17 06/30/2023   MONOPCT 5 06/30/2023   EOSPCT 0 06/30/2023   BASOPCT 0 06/30/2023   CMP:  Lab Results  Component Value Date   NA 140 07/02/2023   NA 145 08/31/2018   K 3.1 (L) 07/02/2023   CL 97 (L) 07/02/2023   CO2 30  07/02/2023   BUN 27 (H) 07/02/2023   BUN 38 (A) 08/31/2018   CREATININE 1.55 (H) 07/02/2023   CREATININE 0.83 04/06/2012   GLU 114 08/31/2018   PROT 6.6 07/02/2023   ALBUMIN 3.1 (L) 07/02/2023   BILITOT 0.4 07/02/2023   ALKPHOS 65 07/02/2023   AST 27 07/02/2023   ALT 16 07/02/2023  .  Total Discharge time is about 33 minutes  Shon Hale M.D on 07/02/2023 at 2:59 PM  Go to [email or web address] -  for contact info  [facility] - Office  [phone]

## 2023-07-02 NOTE — TOC Transition Note (Signed)
Transition of Care [facility]) - CM/SW Discharge Note   Patient Details  Name: Judy Boyd MRN: [id number] Date of Birth: [birth year]  Transition of Care [facility] CM/SW Contact:  Leitha Bleak, RN Phone Number: 07/02/2023, 1:12 PM   Clinical Narrative:   Patient discharging home with [facility]. MD aware to order.     Final next level of care: Home/Self Care Barriers to Discharge: No Barriers Identified  Patient Goals and CMS Choice CMS Medicare.gov Compare Post Acute Care list provided to:: Patient    Discharge Plan and Services Additional resources added to the After Visit Summary for        Social Determinants of Health (SDOH) Interventions SDOH Screenings   Food Insecurity: No Food Insecurity (06/30/2023)  Housing: Low Risk  (06/30/2023)  Transportation Needs: No Transportation Needs (06/30/2023)  Utilities: Not At Risk (06/30/2023)  Depression (PHQ2-9): Medium Risk (06/11/2019)  Financial Resource Strain: Low Risk  (04/27/2022)   Received from [facility]  Social Connections: Unknown (04/27/2022)   Received from [facility]  Stress: No Stress Concern Present (04/27/2022)   Received from [facility]  Tobacco Use: High Risk (06/30/2023)

## 2023-07-02 NOTE — Plan of Care (Signed)

## 2023-07-02 NOTE — Plan of Care (Signed)
  Problem: Education: Goal: Knowledge of General Education information will improve Description: Including pain rating scale, medication(s)/side effects and non-pharmacologic comfort measures 07/02/2023 0159 by Tommi Emery, RN Outcome: Progressing 07/02/2023 0149 by Tommi Emery, RN Outcome: Progressing   Problem: Health Behavior/Discharge Planning: Goal: Ability to manage health-related needs will improve 07/02/2023 0159 by Tommi Emery, RN Outcome: Progressing 07/02/2023 0149 by Tommi Emery, RN Outcome: Progressing   Problem: Clinical Measurements: Goal: Ability to maintain clinical measurements within normal limits will improve 07/02/2023 0159 by Tommi Emery, RN Outcome: Progressing 07/02/2023 0149 by Tommi Emery, RN Outcome: Progressing Goal: Will remain free from infection 07/02/2023 0159 by Tommi Emery, RN Outcome: Progressing 07/02/2023 0149 by Tommi Emery, RN Outcome: Progressing Goal: Diagnostic test results will improve 07/02/2023 0159 by Tommi Emery, RN Outcome: Progressing 07/02/2023 0149 by Tommi Emery, RN Outcome: Progressing Goal: Respiratory complications will improve 07/02/2023 0159 by Tommi Emery, RN Outcome: Progressing 07/02/2023 0149 by Tommi Emery, RN Outcome: Progressing Goal: Cardiovascular complication will be avoided 07/02/2023 0159 by Tommi Emery, RN Outcome: Progressing 07/02/2023 0149 by Tommi Emery, RN Outcome: Progressing   Problem: Activity: Goal: Risk for activity intolerance will decrease 07/02/2023 0159 by Tommi Emery, RN Outcome: Progressing 07/02/2023 0149 by Tommi Emery, RN Outcome: Progressing   Problem: Nutrition: Goal: Adequate nutrition will be maintained 07/02/2023 0159 by Tommi Emery, RN Outcome: Progressing 07/02/2023 0149 by Tommi Emery, RN Outcome: Progressing   Problem: Coping: Goal: Level of anxiety will decrease 07/02/2023 0159 by Tommi Emery, RN Outcome:  Progressing 07/02/2023 0149 by Tommi Emery, RN Outcome: Progressing   Problem: Elimination: Goal: Will not experience complications related to bowel motility 07/02/2023 0159 by Tommi Emery, RN Outcome: Progressing 07/02/2023 0149 by Tommi Emery, RN Outcome: Progressing Goal: Will not experience complications related to urinary retention 07/02/2023 0159 by Tommi Emery, RN Outcome: Progressing 07/02/2023 0149 by Tommi Emery, RN Outcome: Progressing   Problem: Pain Management: Goal: General experience of comfort will improve 07/02/2023 0159 by Tommi Emery, RN Outcome: Progressing 07/02/2023 0149 by Tommi Emery, RN Outcome: Progressing   Problem: Safety: Goal: Ability to remain free from injury will improve 07/02/2023 0159 by Tommi Emery, RN Outcome: Progressing 07/02/2023 0149 by Tommi Emery, RN Outcome: Progressing   Problem: Skin Integrity: Goal: Risk for impaired skin integrity will decrease 07/02/2023 0159 by Tommi Emery, RN Outcome: Progressing 07/02/2023 0149 by Tommi Emery, RN Outcome: Progressing

## 2023-07-04 ENCOUNTER — Encounter (HOSPITAL_BASED_OUTPATIENT_CLINIC_OR_DEPARTMENT_OTHER): Payer: Self-pay

## 2023-07-04 ENCOUNTER — Ambulatory Visit (HOSPITAL_BASED_OUTPATIENT_CLINIC_OR_DEPARTMENT_OTHER): Payer: Medicare HMO

## 2023-07-05 LAB — CULTURE, BLOOD (ROUTINE X 2)
Culture: NO GROWTH
Culture: NO GROWTH

## 2023-07-24 ENCOUNTER — Encounter: Payer: Self-pay | Admitting: Cardiology

## 2023-07-24 ENCOUNTER — Ambulatory Visit: Payer: Medicare HMO | Attending: Cardiology | Admitting: Cardiology

## 2023-07-24 NOTE — Progress Notes (Unsigned)
Clinical Summary Ms. Ruffer is a 62 y.o.female seen today as a new patient. Last seen by Dr Tenny Craw in 2015. Seen today for the following medical problems.   1.LE edema   05/2018 echo: LVEF 60-65%, mod LVH, grade I dd  - recent admit 06/2023 with COPD AKI, Cr up to 2.38 peak. Was on hydrochlorothiazide, lasix prn. Renal function improved with IVFs  ?repeat labs   2.History of chest pain - evaluated by Dr Tenny Craw in 2015 - 2015 lexiscan no ischemia    3. HTN    4. COPD - admit 06/2023 with COPD exacerbation -    5. History of CVA - has been on xarelto by another provider    Past Medical History:  Diagnosis Date   Anxiety    Arthritis    Asthma    Bowel obstruction (HCC)    Chronic pain    COPD (chronic obstructive pulmonary disease) (HCC)    Depression    GERD (gastroesophageal reflux disease)    Hypertension    Stroke (HCC)      Allergies  Allergen Reactions   Ibuprofen Other (See Comments)    Unknown    Tramadol Other (See Comments)    unknown     Current Outpatient Medications  Medication Sig Dispense Refill   acetaminophen (TYLENOL) 325 MG tablet Take 2 tablets (650 mg total) by mouth every 6 (six) hours as needed for mild pain (pain score 1-3) (or Fever >/= 101). 100 tablet 0   albuterol (VENTOLIN HFA) 108 (90 Base) MCG/ACT inhaler Inhale 2 puffs into the lungs every 6 (six) hours as needed for shortness of breath or wheezing. Shortness of breath 8 g 2   amLODipine (NORVASC) 10 MG tablet Take 10 mg by mouth daily.     aspirin EC 81 MG tablet Take 81 mg by mouth daily.     citalopram (CELEXA) 10 MG tablet Take 10 mg by mouth daily.     Cyanocobalamin (B-12 PO) Take 1 tablet by mouth daily.     esomeprazole (NEXIUM) 20 MG capsule Take 20 mg by mouth every morning.     fluticasone-salmeterol (WIXELA INHUB) 250-50 MCG/ACT AEPB Inhale 1 puff into the lungs in the morning and at bedtime. 180 each 12   furosemide (LASIX) 20 MG tablet Take 20 mg  by mouth daily as needed for fluid or edema.     gabapentin (NEURONTIN) 100 MG capsule Take 100 mg by mouth at bedtime.     mirtazapine (REMERON) 15 MG tablet Take 15 mg by mouth at bedtime.     Multiple Vitamins-Minerals (CENTRUM SILVER 50+WOMEN) TABS Take 1 tablet by mouth daily.     Potassium Chloride ER 20 MEQ TBCR Take 1 tablet (20 mEq total) by mouth daily for 3 days. 1 tab daily by mouth 3 tablet 0   rosuvastatin (CRESTOR) 40 MG tablet Take 40 mg by mouth daily.     tiZANidine (ZANAFLEX) 4 MG tablet Take 4 mg by mouth 2 (two) times daily as needed for muscle spasms.     XARELTO 20 MG TABS tablet Take 20 mg by mouth daily with supper.     No current facility-administered medications for this visit.     Past Surgical History:  Procedure Laterality Date   ABDOMINAL SURGERY  2003   blockage   left salpingectomy     uterine ablation       Allergies  Allergen Reactions   Ibuprofen Other (See Comments)  Unknown    Tramadol Other (See Comments)    unknown      Family History  Problem Relation Age of Onset   Heart disease Mother    Heart disease Father      Social History Ms. Misch reports that she has been smoking cigarettes. She has a 10 pack-year smoking history. She has never used smokeless tobacco. Ms. Fazzi reports current alcohol use.   Review of Systems CONSTITUTIONAL: No weight loss, fever, chills, weakness or fatigue.  HEENT: Eyes: No visual loss, blurred vision, double vision or yellow sclerae.No hearing loss, sneezing, congestion, runny nose or sore throat.  SKIN: No rash or itching.  CARDIOVASCULAR:  RESPIRATORY: No shortness of breath, cough or sputum.  GASTROINTESTINAL: No anorexia, nausea, vomiting or diarrhea. No abdominal pain or blood.  GENITOURINARY: No burning on urination, no polyuria NEUROLOGICAL: No headache, dizziness, syncope, paralysis, ataxia, numbness or tingling in the extremities. No change in bowel or bladder control.   MUSCULOSKELETAL: No muscle, back pain, joint pain or stiffness.  LYMPHATICS: No enlarged nodes. No history of splenectomy.  PSYCHIATRIC: No history of depression or anxiety.  ENDOCRINOLOGIC: No reports of sweating, cold or heat intolerance. No polyuria or polydipsia.  Marland Kitchen   Physical Examination There were no vitals filed for this visit. There were no vitals filed for this visit.  Gen: resting comfortably, no acute distress HEENT: no scleral icterus, pupils equal round and reactive, no palptable cervical adenopathy,  CV Resp: Clear to auscultation bilaterally GI: abdomen is soft, non-tender, non-distended, normal bowel sounds, no hepatosplenomegaly MSK: extremities are warm, no edema.  Skin: warm, no rash Neuro:  no focal deficits Psych: appropriate affect   Diagnostic Studies  2015 lexiscan IMPRESSION:  1. Normal Lexiscan Cardiolite stress test.   2.  No evidence of myocardial ischemia or scar.   3.  Normal left ventricular systolic function, calculated LV EF 57%.    05/2018 echo Study Conclusions   - Left ventricle: The cavity size was normal. Wall thickness was    increased in a pattern of moderate LVH. Systolic function was    normal. The estimated ejection fraction was in the range of 60%    to 65%. Wall motion was normal; there were no regional wall    motion abnormalities. Doppler parameters are consistent with    abnormal left ventricular relaxation (grade 1 diastolic    dysfunction).  - Aortic valve: Valve area (VTI): 2 cm^2. Valve area (Vmax): 2.21    cm^2. Valve area (Vmean): 2.11 cm^2.  - Left atrium: The atrium was mildly dilated.  - Technically adequate study.   Assessment and Plan        Antoine Poche, M.D., F.A.C.C.

## 2024-04-16 ENCOUNTER — Other Ambulatory Visit: Payer: Self-pay

## 2024-04-16 ENCOUNTER — Encounter (HOSPITAL_COMMUNITY): Payer: Self-pay | Admitting: Emergency Medicine

## 2024-04-16 ENCOUNTER — Emergency Department (HOSPITAL_COMMUNITY)
Admission: EM | Admit: 2024-04-16 | Discharge: 2024-04-17 | Disposition: A | Discharge status: Home / Self Care | Attending: Emergency Medicine | Admitting: Emergency Medicine

## 2024-04-16 DIAGNOSIS — I10 Essential (primary) hypertension: Secondary | ICD-10-CM | POA: Insufficient documentation

## 2024-04-16 DIAGNOSIS — Z7982 Long term (current) use of aspirin: Secondary | ICD-10-CM | POA: Diagnosis not present

## 2024-04-16 DIAGNOSIS — N390 Urinary tract infection, site not specified: Secondary | ICD-10-CM

## 2024-04-16 DIAGNOSIS — Z8673 Personal history of transient ischemic attack (TIA), and cerebral infarction without residual deficits: Secondary | ICD-10-CM | POA: Diagnosis not present

## 2024-04-16 DIAGNOSIS — Z79899 Other long term (current) drug therapy: Secondary | ICD-10-CM | POA: Insufficient documentation

## 2024-04-16 DIAGNOSIS — Z7951 Long term (current) use of inhaled steroids: Secondary | ICD-10-CM | POA: Diagnosis not present

## 2024-04-16 DIAGNOSIS — R319 Hematuria, unspecified: Secondary | ICD-10-CM | POA: Diagnosis present

## 2024-04-16 DIAGNOSIS — J449 Chronic obstructive pulmonary disease, unspecified: Secondary | ICD-10-CM | POA: Diagnosis not present

## 2024-04-16 DIAGNOSIS — Z7901 Long term (current) use of anticoagulants: Secondary | ICD-10-CM | POA: Insufficient documentation

## 2024-04-16 LAB — COMPREHENSIVE METABOLIC PANEL WITH GFR
ALT: 12 U/L (ref 0–44)
AST: 21 U/L (ref 15–41)
Albumin: 3 g/dL — ABNORMAL LOW (ref 3.5–5.0)
Alkaline Phosphatase: 90 U/L (ref 38–126)
Anion gap: 12 (ref 5–15)
BUN: 11 mg/dL (ref 8–23)
CO2: 26 mmol/L (ref 22–32)
Calcium: 9.4 mg/dL (ref 8.9–10.3)
Chloride: 104 mmol/L (ref 98–111)
Creatinine, Ser: 0.93 mg/dL (ref 0.44–1.00)
GFR, Estimated: >60 mL/min (ref >60)
Glucose, Bld: 111 mg/dL — ABNORMAL HIGH (ref 70–99)
Potassium: 3.5 mmol/L (ref 3.5–5.1)
Sodium: 142 mmol/L (ref 135–145)
Total Bilirubin: 0.7 mg/dL (ref 0.0–1.2)
Total Protein: 6.7 g/dL (ref 6.5–8.1)

## 2024-04-16 LAB — CBC WITH DIFFERENTIAL/PLATELET
Abs Immature Granulocytes: 0.05 K/uL (ref 0.00–0.07)
Basophils Absolute: 0 K/uL (ref 0.0–0.1)
Basophils Relative: 0 %
Eosinophils Absolute: 0.2 K/uL (ref 0.0–0.5)
Eosinophils Relative: 2 %
HCT: 38.9 % (ref 36.0–46.0)
Hemoglobin: 11.7 g/dL — ABNORMAL LOW (ref 12.0–15.0)
Immature Granulocytes: 0 %
Lymphocytes Relative: 34 %
Lymphs Abs: 4.4 K/uL — ABNORMAL HIGH (ref 0.7–4.0)
MCH: 27.2 pg (ref 26.0–34.0)
MCHC: 30.1 g/dL (ref 30.0–36.0)
MCV: 90.5 fL (ref 80.0–100.0)
Monocytes Absolute: 0.7 K/uL (ref 0.1–1.0)
Monocytes Relative: 5 %
Neutro Abs: 7.5 K/uL (ref 1.7–7.7)
Neutrophils Relative %: 59 %
Platelets: 285 K/uL (ref 150–400)
RBC: 4.3 MIL/uL (ref 3.87–5.11)
RDW: 13.9 % (ref 11.5–15.5)
WBC: 12.8 K/uL — ABNORMAL HIGH (ref 4.0–10.5)
nRBC: 0 % (ref 0.0–0.2)

## 2024-04-16 LAB — LIPASE, BLOOD: Lipase: 28 U/L (ref 11–51)

## 2024-04-16 NOTE — ED Triage Notes (Signed)
 BIB EMS from home where family complains of blood in patient brief. Patient denies pain anywhere but states she has been more sleepy the las few days.

## 2024-04-16 NOTE — ED Provider Notes (Incomplete)
 [place] EMERGENCY DEPARTMENT AT [facility] Provider Note   CSN: [id number] Arrival date & time: 04/16/24  2207     Patient presents with: Hematuria   Judy Boyd is a 63 y.o. female.  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  Patient brought today for evaluation of possible UTI.  Patient's caretaker is her daughter who changed her brief this evening.  She noted there was dark urine that appeared to be tinged with blood.  She has had a UTI in the past, but denies any dysuria, fevers, or other complaints.       Prior to Admission medications   Medication Sig Start Date End Date Taking? Authorizing Provider  acetaminophen  (TYLENOL ) 325 MG tablet Take 2 tablets (650 mg total) by mouth every 6 (six) hours as needed for mild pain (pain score 1-3) (or Fever >/= 101). 07/02/23   Pearlean Manus, MD  albuterol  (VENTOLIN  HFA) 108 (90 Base) MCG/ACT inhaler Inhale 2 puffs into the lungs every 6 (six) hours as needed for shortness of breath or wheezing. Shortness of breath 07/02/23   Pearlean Manus, MD  amLODipine  (NORVASC ) 10 MG tablet Take 10 mg by mouth daily.    [provider]  aspirin  EC 81 MG tablet Take 81 mg by mouth daily.    [provider]  citalopram  (CELEXA ) 10 MG tablet Take 10 mg by mouth daily. 05/06/22   [provider]  Cyanocobalamin (B-12 PO) Take 1 tablet by mouth daily.    [provider]  esomeprazole (NEXIUM) 20 MG capsule Take 20 mg by mouth every morning. 06/17/23   [provider]  fluticasone-salmeterol (WIXELA INHUB ) 250-50 MCG/ACT AEPB Inhale 1 puff into the lungs in the morning and at bedtime. 07/02/23   Pearlean Manus, MD  furosemide (LASIX) 20 MG tablet Take 20 mg by mouth daily as needed for fluid or edema.    [provider]   gabapentin  (NEURONTIN ) 100 MG capsule Take 100 mg by mouth at bedtime.    [provider]  mirtazapine  (REMERON ) 15 MG tablet Take 15 mg by mouth at bedtime.    [provider]  Multiple Vitamins-Minerals (CENTRUM SILVER 50+WOMEN) TABS Take 1 tablet by mouth daily.    [provider]  Potassium Chloride  ER 20 MEQ TBCR Take 1 tablet (20 mEq total) by mouth daily for 3 days. 1 tab daily by mouth 07/02/23 07/05/23  Pearlean Manus, MD  rosuvastatin  (CRESTOR ) 40 MG tablet Take 40 mg by mouth daily.    [provider]  tiZANidine  (ZANAFLEX ) 4 MG tablet Take 4 mg by mouth 2 (two) times daily as needed for muscle spasms.    [provider]  XARELTO  20 MG TABS tablet Take 20 mg by mouth daily with supper.    [provider]    Allergies: Ibuprofen  and Tramadol    Review of Systems  All other systems reviewed and are negative.   Updated Vital Signs BP (!) 111/95 (BP Location: Right Arm)   Pulse 75   Temp 98.7 F (37.1 C) (Oral)   Resp (!) 22   Ht 5' 8 (1.727 m)   Wt 85 kg   SpO2 94%   BMI 28.49 kg/m   Physical Exam Vitals and nursing note reviewed.  Constitutional:      General: She is not in acute distress.  Appearance: She is well-developed. She is not diaphoretic.  HENT:     Head: Normocephalic and atraumatic.  Cardiovascular:     Rate and Rhythm: Normal rate and regular rhythm.     Heart sounds: No murmur heard.    No friction rub. No gallop.  Pulmonary:     Effort: Pulmonary effort is normal. No respiratory distress.     Breath sounds: Normal breath sounds. No wheezing.  Abdominal:     General: Bowel sounds are normal. There is no distension.     Palpations: Abdomen is soft.     Tenderness: There is no abdominal tenderness.  Musculoskeletal:        General: Normal range of motion.     Cervical back: Normal range of motion and neck supple.  Skin:    General: Skin is warm and dry.  Neurological:     Mental  Status: She is alert and oriented to person, place, and time.     (all labs ordered are listed, but only abnormal results are displayed) Labs Reviewed  CBC WITH DIFFERENTIAL/PLATELET  COMPREHENSIVE METABOLIC PANEL WITH GFR  LIPASE, BLOOD  URINALYSIS, ROUTINE W REFLEX MICROSCOPIC    EKG: None  Radiology: No results found.  {Document cardiac monitor, telemetry assessment procedure when appropriate:32947} Procedures   Medications Ordered in the ED - No data to display    {Click here for ABCD2, HEART and other calculators REFRESH Note before signing:1}                              Medical Decision Making  ***  {Document critical care time when appropriate  Document review of labs and clinical decision tools ie CHADS2VASC2, etc  Document your independent review of radiology images and any outside records  Document your discussion with family members, caretakers and with consultants  Document social determinants of health affecting pt's care  Document your decision making why or why not admission, treatments were needed:32947:::1}   Final diagnoses:  None    ED Discharge Orders     None

## 2024-04-16 NOTE — ED Notes (Signed)
 Patient does not want to be catheterized and states she does not have to urinate at this time.

## 2024-04-17 ENCOUNTER — Emergency Department (HOSPITAL_COMMUNITY)

## 2024-04-17 DIAGNOSIS — R319 Hematuria, unspecified: Secondary | ICD-10-CM | POA: Diagnosis not present

## 2024-04-17 LAB — URINALYSIS, ROUTINE W REFLEX MICROSCOPIC
Bilirubin Urine: NEGATIVE
Glucose, UA: NEGATIVE mg/dL
Ketones, ur: NEGATIVE mg/dL
Leukocytes,Ua: NEGATIVE
Nitrite: NEGATIVE
Protein, ur: >=300 mg/dL — AB
Specific Gravity, Urine: 1.018 (ref 1.005–1.030)
pH: 6 (ref 5.0–8.0)

## 2024-04-17 MED ORDER — CEPHALEXIN 500 MG PO CAPS
500.0000 mg | ORAL_CAPSULE | Freq: Once | ORAL | Status: AC
  Administered 2024-04-17: 500 mg via ORAL
  Filled 2024-04-17: qty 1

## 2024-04-17 MED ORDER — CEPHALEXIN 500 MG PO CAPS: 500.0000 mg | ORAL_CAPSULE | Freq: Three times a day (TID) | ORAL | 0 refills | Status: DC

## 2024-04-17 NOTE — Discharge Instructions (Signed)
 Begin taking Keflex  as prescribed.  Follow-up with primary doctor if symptoms persist, and return to the ER if symptoms significantly worsen or change.

## 2024-05-20 ENCOUNTER — Encounter (HOSPITAL_COMMUNITY): Payer: Self-pay | Admitting: Emergency Medicine

## 2024-05-20 ENCOUNTER — Other Ambulatory Visit: Payer: Self-pay

## 2024-05-20 ENCOUNTER — Emergency Department (HOSPITAL_COMMUNITY)
Admission: EM | Admit: 2024-05-20 | Discharge: 2024-05-20 | Disposition: A | Discharge status: Home / Self Care | Attending: Emergency Medicine | Admitting: Emergency Medicine

## 2024-05-20 ENCOUNTER — Emergency Department (HOSPITAL_COMMUNITY)

## 2024-05-20 DIAGNOSIS — Z7901 Long term (current) use of anticoagulants: Secondary | ICD-10-CM | POA: Diagnosis not present

## 2024-05-20 DIAGNOSIS — E876 Hypokalemia: Secondary | ICD-10-CM | POA: Insufficient documentation

## 2024-05-20 DIAGNOSIS — Z7982 Long term (current) use of aspirin: Secondary | ICD-10-CM | POA: Diagnosis not present

## 2024-05-20 DIAGNOSIS — Z720 Tobacco use: Secondary | ICD-10-CM | POA: Diagnosis not present

## 2024-05-20 DIAGNOSIS — R310 Gross hematuria: Secondary | ICD-10-CM | POA: Insufficient documentation

## 2024-05-20 LAB — BASIC METABOLIC PANEL WITH GFR
Anion gap: 11 (ref 5–15)
BUN: 9 mg/dL (ref 8–23)
CO2: 29 mmol/L (ref 22–32)
Calcium: 9.7 mg/dL (ref 8.9–10.3)
Chloride: 105 mmol/L (ref 98–111)
Creatinine, Ser: 0.84 mg/dL (ref 0.44–1.00)
GFR, Estimated: >60 mL/min (ref >60)
Glucose, Bld: 101 mg/dL — ABNORMAL HIGH (ref 70–99)
Potassium: 3 mmol/L — ABNORMAL LOW (ref 3.5–5.1)
Sodium: 145 mmol/L (ref 135–145)

## 2024-05-20 LAB — CBC WITH DIFFERENTIAL/PLATELET
Abs Immature Granulocytes: 0.03 K/uL (ref 0.00–0.07)
Basophils Absolute: 0 K/uL (ref 0.0–0.1)
Basophils Relative: 0 %
Eosinophils Absolute: 0.1 K/uL (ref 0.0–0.5)
Eosinophils Relative: 1 %
HCT: 37.5 % (ref 36.0–46.0)
Hemoglobin: 11.4 g/dL — ABNORMAL LOW (ref 12.0–15.0)
Immature Granulocytes: 0 %
Lymphocytes Relative: 27 %
Lymphs Abs: 2.8 K/uL (ref 0.7–4.0)
MCH: 27.3 pg (ref 26.0–34.0)
MCHC: 30.4 g/dL (ref 30.0–36.0)
MCV: 89.9 fL (ref 80.0–100.0)
Monocytes Absolute: 0.5 K/uL (ref 0.1–1.0)
Monocytes Relative: 5 %
Neutro Abs: 6.7 K/uL (ref 1.7–7.7)
Neutrophils Relative %: 67 %
Platelets: 260 K/uL (ref 150–400)
RBC: 4.17 MIL/uL (ref 3.87–5.11)
RDW: 14.3 % (ref 11.5–15.5)
WBC: 10.2 K/uL (ref 4.0–10.5)
nRBC: 0 % (ref 0.0–0.2)

## 2024-05-20 LAB — URINALYSIS, W/ REFLEX TO CULTURE (INFECTION SUSPECTED)
Bilirubin Urine: NEGATIVE
Glucose, UA: NEGATIVE mg/dL
Ketones, ur: NEGATIVE mg/dL
Nitrite: POSITIVE — AB
Protein, ur: >300 mg/dL — AB
Specific Gravity, Urine: 1.025 (ref 1.005–1.030)
pH: 5.5 (ref 5.0–8.0)

## 2024-05-20 LAB — PROTIME-INR
INR: 2.2 — ABNORMAL HIGH (ref 0.8–1.2)
Prothrombin Time: 25.8 s — ABNORMAL HIGH (ref 11.4–15.2)

## 2024-05-20 MED ORDER — CEFUROXIME AXETIL 250 MG PO TABS: 250.0000 mg | ORAL_TABLET | Freq: Two times a day (BID) | ORAL | 0 refills | Status: AC

## 2024-05-20 MED ORDER — SODIUM CHLORIDE 0.9 % IV BOLUS
500.0000 mL | Freq: Once | INTRAVENOUS | Status: AC
  Administered 2024-05-20: 500 mL via INTRAVENOUS

## 2024-05-20 MED ORDER — POTASSIUM CHLORIDE CRYS ER 20 MEQ PO TBCR: 20.0000 meq | EXTENDED_RELEASE_TABLET | Freq: Every day | ORAL | 0 refills | Status: AC

## 2024-05-20 MED ORDER — IOHEXOL 300 MG/ML  SOLN
100.0000 mL | Freq: Once | INTRAMUSCULAR | Status: AC | PRN
  Administered 2024-05-20: 100 mL via INTRAVENOUS

## 2024-05-20 MED ORDER — POTASSIUM CHLORIDE 20 MEQ PO PACK
40.0000 meq | PACK | Freq: Once | ORAL | Status: DC
Start: 2024-05-20 — End: 2024-05-21

## 2024-05-20 NOTE — ED Notes (Signed)
 Rounding done on pt, pt stated she doesn't need anything at this time.

## 2024-05-20 NOTE — ED Notes (Addendum)
 Unable to establish IV access. SWOT RN contacted for assistance with an US  IV. PT has hx of US  IV in previous hospital encounters

## 2024-05-20 NOTE — ED Provider Notes (Signed)
 [place] EMERGENCY DEPARTMENT AT [facility] Provider Note   CSN: [id number] Arrival date & time: 05/20/24  1159     Patient presents with: Hematuria   C[place] is a 63 y.o. female.  He has a prior history of stroke which has left her with some dysarthria and left arm weakness bilateral leg weakness bedbound.  Wears diapers and is incontinent.  Family helps care for her.  She was here last month for blood in the diaper and treated for urinary tract infection.  Bleeding has been going on and off since then.  Daughter wants to know why it has not gotten any better.  She also tells me that she has had a positive Cologuard in the past but has not been able to get a colonoscopy.  Also they found some kidney stones in the past.  They have noticed that she has been sleeping more than usual and has had less of an appetite.  Patient denies any acute concerns.   The history is provided by the patient.  Hematuria This is a recurrent problem. Episode onset: 1 month. The problem has not changed since onset.Pertinent negatives include no chest pain, no abdominal pain, no headaches and no shortness of breath. Nothing aggravates the symptoms. Nothing relieves the symptoms. She has tried nothing for the symptoms. The treatment provided no relief.       Prior to Admission medications   Medication Sig Start Date End Date Taking? Authorizing Provider  acetaminophen  (TYLENOL ) 325 MG tablet Take 2 tablets (650 mg total) by mouth every 6 (six) hours as needed for mild pain (pain score 1-3) (or Fever >/= 101). 07/02/23   Pearlean Manus, MD  albuterol  (VENTOLIN  HFA) 108 (90 Base) MCG/ACT inhaler Inhale 2 puffs into the lungs every 6 (six) hours as needed for shortness of breath or wheezing. Shortness of breath 07/02/23   Pearlean Manus, MD  amLODipine  (NORVASC ) 10 MG tablet Take 10 mg by mouth daily.    [provider]  aspirin  EC 81 MG tablet Take 81 mg by mouth daily.     [provider]  cephALEXin  (KEFLEX ) 500 MG capsule Take 1 capsule (500 mg total) by mouth 3 (three) times daily. 04/17/24   Geroldine Berg, MD  citalopram  (CELEXA ) 10 MG tablet Take 10 mg by mouth daily. 05/06/22   [provider]  Cyanocobalamin (B-12 PO) Take 1 tablet by mouth daily.    [provider]  esomeprazole (NEXIUM) 20 MG capsule Take 20 mg by mouth every morning. 06/17/23   [provider]  fluticasone-salmeterol (WIXELA INHUB ) 250-50 MCG/ACT AEPB Inhale 1 puff into the lungs in the morning and at bedtime. 07/02/23   Pearlean Manus, MD  furosemide (LASIX) 20 MG tablet Take 20 mg by mouth daily as needed for fluid or edema.    [provider]  gabapentin  (NEURONTIN ) 100 MG capsule Take 100 mg by mouth at bedtime.    [provider]  mirtazapine  (REMERON ) 15 MG tablet Take 15 mg by mouth at bedtime.    [provider]  Multiple Vitamins-Minerals (CENTRUM SILVER 50+WOMEN) TABS Take 1 tablet by mouth daily.    [provider]  Potassium Chloride  ER 20 MEQ TBCR Take 1 tablet (20 mEq total) by mouth daily for 3 days. 1 tab daily by mouth 07/02/23 07/05/23  Pearlean Manus, MD  rosuvastatin  (CRESTOR ) 40 MG tablet Take 40 mg by mouth daily.    [provider]  tiZANidine  (ZANAFLEX ) 4 MG  tablet Take 4 mg by mouth 2 (two) times daily as needed for muscle spasms.    [provider]  XARELTO  20 MG TABS tablet Take 20 mg by mouth daily with supper.    [provider]    Allergies: Ibuprofen  and Tramadol    Review of Systems  Constitutional:  Negative for fever.  Respiratory:  Negative for shortness of breath.   Cardiovascular:  Negative for chest pain.  Gastrointestinal:  Negative for abdominal pain.  Genitourinary:  Positive for hematuria. Negative for dysuria.  Neurological:  Negative for headaches.    Updated Vital Signs BP (!) 109/59   Pulse 63   Temp 99 F (37.2 C) (Oral)   Resp 16    Ht 5' 8 (1.727 m)   Wt 85 kg   SpO2 90%   BMI 28.49 kg/m   Physical Exam Vitals and nursing note reviewed.  Constitutional:      General: She is not in acute distress.    Appearance: Normal appearance. She is well-developed.  HENT:     Head: Normocephalic and atraumatic.  Eyes:     Conjunctiva/sclera: Conjunctivae normal.  Cardiovascular:     Rate and Rhythm: Normal rate and regular rhythm.     Heart sounds: No murmur heard. Pulmonary:     Effort: Pulmonary effort is normal. No respiratory distress.     Breath sounds: Normal breath sounds. No stridor. No wheezing.  Abdominal:     Palpations: Abdomen is soft.     Tenderness: There is no abdominal tenderness. There is no guarding or rebound.  Musculoskeletal:        General: No deformity.     Cervical back: Neck supple.  Skin:    General: Skin is warm and dry.  Neurological:     Mental Status: She is alert.     GCS: GCS eye subscore is 4. GCS verbal subscore is 5. GCS motor subscore is 6.     Motor: Weakness present.     Comments: She is awake and alert.  She has some dysarthric speech but is able to be understood.  She has minimal use of her left arm and no use of her legs bilaterally.     (all labs ordered are listed, but only abnormal results are displayed) Labs Reviewed  BASIC METABOLIC PANEL WITH GFR - Abnormal; Notable for the following components:      Result Value   Potassium 3.0 (*)    Glucose, Bld 101 (*)    All other components within normal limits  CBC WITH DIFFERENTIAL/PLATELET - Abnormal; Notable for the following components:   Hemoglobin 11.4 (*)    All other components within normal limits  URINALYSIS, W/ REFLEX TO CULTURE (INFECTION SUSPECTED) - Abnormal; Notable for the following components:   Color, Urine RED (*)    APPearance CLOUDY (*)    Hgb urine dipstick LARGE (*)    Protein, ur >300 (*)    Nitrite POSITIVE (*)    Leukocytes,Ua TRACE (*)    Bacteria, UA   (*)    Value: TEST NOT REPORTED  DUE TO COLOR INTERFERENCE OF URINE PIGMENT   All other components within normal limits  PROTIME-INR - Abnormal; Notable for the following components:   Prothrombin Time 25.8 (*)    INR 2.2 (*)    All other components within normal limits    EKG: None  Radiology: CT ABDOMEN PELVIS W CONTRAST Result Date: 05/20/2024 CLINICAL DATA:  Recent UTI red discoloration in diaper  abdominal pain EXAM: CT ABDOMEN AND PELVIS WITH CONTRAST TECHNIQUE: Multidetector CT imaging of the abdomen and pelvis was performed using the standard protocol following bolus administration of intravenous contrast. RADIATION DOSE REDUCTION: This exam was performed according to the departmental dose-optimization program which includes automated exposure control, adjustment of the mA and/or kV according to patient size and/or use of iterative reconstruction technique. CONTRAST:  OMNIPAQUE  IOHEXOL  300 MG/ML  SOLN COMPARISON:  CT 04/17/2024 FINDINGS: Lower chest: Lung bases demonstrate no acute airspace disease. Hepatobiliary: Small gallstones. Hepatic cyst in the right hepatic lobe with additional subcentimeter hypodensities too small to further characterize. No biliary dilatation Pancreas: Unremarkable. No pancreatic ductal dilatation or surrounding inflammatory changes. Spleen: Normal in size without focal abnormality. Adrenals/Urinary Tract: Adrenal glands are normal. Kidneys show no hydronephrosis. Renal cysts and subcentimeter hypodensities for which no specific imaging follow-up is recommended. Small bilateral kidney stones. 6 mm stone in the right renal pelvis, there may be mild urothelial thickening of right renal pelvis at the level of stone, series 3, image 60. No ureteral stone. The bladder is unremarkable Stomach/Bowel: Stomach within normal limits. No dilated small bowel. Diverticular disease of the colon without acute wall thickening. Moderate stool in the left colon and rectum. Slightly prominent appearing appendix,  measures up to 8 mm slightly indistinct appearance on series 2, image 53 but no convincing stranding on coronal view. Vascular/Lymphatic: Advanced aortic atherosclerosis. Focally ectatic distal infrarenal abdominal aorta up to 2.9 cm. No suspicious lymph nodes. Reproductive: Negative for adnexal mass.  Uterus unremarkable Other: Negative for pelvic effusion or free air. Musculoskeletal: No acute osseous abnormality. Edema within the subcutaneous soft tissues of the hips and proximal thighs. IMPRESSION: 1. 6 mm stone in the right renal pelvis with possible mild urothelial thickening of the right renal pelvis at the level of stone, correlate for upper UTI. No hydronephrosis or ureteral stone. 2. Small bilateral kidney stones. 3. Diverticular disease of the colon without acute wall thickening. 4. Equivocal appearance of the appendix which measures up to 8 mm and appears slightly indistinct on axial views. Correlate for any clinical signs or symptoms of appendicitis. 5. Gallstones. Aortic Atherosclerosis (ICD10-I70.0). Electronically Signed   By: Luke Bun M.D.   On: 05/20/2024 16:47     Procedures   Medications Ordered in the ED  potassium chloride  (KLOR-CON ) packet 40 mEq (40 mEq Oral Patient Refused/Not Given 05/20/24 1627)  sodium chloride  0.9 % bolus 500 mL (500 mLs Intravenous Other (enter comment in med admin window) 05/20/24 1630)  iohexol  (OMNIPAQUE ) 300 MG/ML solution 100 mL (100 mLs Intravenous Contrast Given 05/20/24 1541)    Clinical Course as of 05/20/24 1659  Fri May 20, 2024  1509 Handoff MCB 63 yo/ f Hx cva, bed bound, incontinent / diaper Had hematuria 1 month ago, has re-occurred  Pt denies complaint K mild low, hgb is stable CTAP pending  [SG]    Clinical Course User Index [SG] Elnor Jayson LABOR, DO                                 Medical Decision Making Amount and/or Complexity of Data Reviewed Labs: ordered. Radiology: ordered.  Risk Prescription drug  management.   This patient complains of blood in her diaper possible hematuria; this involves an extensive number of treatment Options and is a complaint that carries with it a high risk of complications and morbidity. The differential includes UTI, renal colic,  vaginal bleeding, colitis, diverticulitis  I ordered, reviewed and interpreted labs, which included CBC with normal white count stable hemoglobin, chemistries with low potassium normal renal function, urinalysis pending at time of signout, INR elevated I ordered imaging studies which included CT abdomen and pelvis and this is pending at time of signout  additional history obtained from patient's daughter Previous records obtained and reviewed in epic including recent ED note Cardiac monitoring reviewed, sinus rhythm Social determinants considered, tobacco use Critical Interventions: None  After the interventions stated above, I reevaluated the patient and found patient to be well-appearing hemodynamically stable Admission and further testing considered, her care is signed out to Dr. Elnor to follow-up and results of urinalysis chemistry and CT scan.  Possibly has UTI versus stone.      Final diagnoses:  Gross hematuria    ED Discharge Orders     None          Towana Ozell BROCKS, MD 05/20/24 1704

## 2024-05-20 NOTE — ED Notes (Signed)
 Pt brief changed. Clean & dry at this time

## 2024-05-20 NOTE — ED Provider Notes (Addendum)
 Provider Note MRN:  [id number]  Arrival date & time: 05/20/24    ED Course and Medical Decision Making  Assumed care from Dr Towana at shift change.  See note from prior team for complete details, in brief:  Clinical Course as of 05/20/24 1751  Fri May 20, 2024  1509 Handoff MCB 63 yo/ f Hx cva, bed bound, incontinent / diaper Had hematuria 1 month ago, has re-occurred  Pt denies complaint K mild low, hgb is stable CTAP pending  [SG]    Clinical Course User Index [SG] Elnor Savant A, DO   CT resulted:  IMPRESSION:  1. 6 mm stone in the right renal pelvis with possible mild  urothelial thickening of the right renal pelvis at the level of  stone, correlate for upper UTI. No hydronephrosis or ureteral stone.  2. Small bilateral kidney stones.  3. Diverticular disease of the colon without acute wall thickening.  4. Equivocal appearance of the appendix which measures up to 8 mm  and appears slightly indistinct on axial views. Correlate for any  clinical signs or symptoms of appendicitis.  5. Gallstones.    Pt does not have any RLQ abd pain,  no n/v, fever, appendicitis seems unlikely at this time. Advised to RTED if these symptoms develop,  Gallstones noted on CT, she has no RUQ pain, no murphy sign, cholecystitis seems unlikely  Hgb is stable compared to prior, she is HDS, will have pt f/u with urology in the office for further eval of gross hematuria, may benefit from cystoscopy. Would be reasonable to place pt on abx for possible hemorrhagic cystitis   Hypokalemia noted, pt's daughter does not want her to have potassium replacement, says her mother does not like the taste, will send rx for home  Pt's family combative and verbally aggressive/abusive to ED staff and myself. Using foul/abusive languange.  Pt has no complaints and seems content with today's care plan, family is combative and very upset regarding encounter in the ED  This was a difficult patient encounter.   The patient's daughter's expectations as to management today in the ED.  I discussed at length with the patient my concerns and offered treatment options that were in keeping with our policies and procedures.  Unfortunately this did not alleviate the patient's concerns.  At all times, myself and the staff were respectful of the patient, attempted to solve their issues within our constraints, and treated the patient with all due courtesy.  Unfortunately the patient's daughter left disappointed as to their expectations.  The patient received medically appropriate treatment for the objective findings.  I encouraged them to follow up with their primary care provider and w/ urology for further treatment and to return to the ED if they had any other symptoms or concerns.   5:51 PM:  I have discussed the diagnosis/risks/treatment options with the patient and family.  Evaluation and diagnostic testing in the emergency department does not suggest an emergent condition requiring admission or immediate intervention beyond what has been performed at this time.  They will follow up with uro/pcp. We also discussed returning to the ED immediately if new or worsening sx occur. We discussed the sx which are most concerning (e.g., sudden worsening pain, fever, inability to tolerate by mouth, vomiting) that necessitate immediate return.    The patient appears reasonably screen and/or stabilized for discharge and I doubt any other medical condition or other [facility] requiring further screening, evaluation, or treatment in the ED at this time prior  to discharge.      Procedures  Final Clinical Impressions(s) / ED Diagnoses     ICD-10-CM   1. Gross hematuria  R31.0     2. Hypokalemia  E87.6       ED Discharge Orders     None         Discharge Instructions      It was a pleasure caring for you today in the emergency department.  Your workup today was reassuring.  Please be sure to call urology office on Monday  to arrange follow-up.  Please return to the emergency department for any worsening or worrisome symptoms.           Elnor Jayson LABOR, DO 05/20/24 1751    Elnor Jayson LABOR, DO 05/20/24 1753

## 2024-05-20 NOTE — Discharge Instructions (Addendum)
 It was a pleasure caring for you today in the emergency department.  Your workup today was reassuring.  Please be sure to call urology office on Monday to arrange follow-up. Please follow up with your PCP next week for recheck of your potassium as it was low today.   Please return to the emergency department for any worsening or worrisome symptoms.

## 2024-05-20 NOTE — ED Notes (Signed)
 Pt room smells of marijuana. RN entered to update pt on plan of care. Before RN could finish speaking, pt daughter interrupted with an agitated tone. A previous note in the pt chart stated she did not want to be cathed and she did not have to pee. RN asked pt if she consented to being cathed. Pt daughter abruptly interrupted stating, how else you think you gone get it? She in a diaper. Pt  daughter continued in a disapproving tone stated. I done told the girl at the front. RN stated she was simply attempting to be considerate. RN let pt and daughter know that we also would be drawing blood work. Pt daughter stated, Well y'all better figure this out.  RN asked if there was a missing piece of the puzzle or if something had happened that was not made aware of. Pt said no and pt daughter rolled her eyes and stated, No I done told someone she in a diaper. RN educated pt and pt daughter that we always do our best for our patients and will try to do whatever she needs.

## 2024-05-20 NOTE — ED Notes (Signed)
 [facility] called to transport patient home. Nurse aware.

## 2024-05-20 NOTE — ED Notes (Signed)
 Daughter continues to call cursing at staff due to long time they are waiting for transportation. Daughter informed we have no control over transport & she is more than welcome to take her home herself. Daughter highly upset & stating she is going to contact her lawyer. Pt currently on facetime with her daughter in hall bed.

## 2024-05-20 NOTE — ED Notes (Signed)
 Pt daughter continues to call out and communicate in an aggressive manner. RN attempted on multiple occasions to deescalate family member without success. PT daughter stated no one in here knows anything and we better figure it out. Pt daughter also stated we better figure out how the hell we're getting her mother home and they ain't waitin' either. RN educated that we have no control over how long emergency services takes to transport a non-emergent patient. Pts daughter stated she don't like my damn attitude and she wants to see someone else. RN offered security. Pt daughter rolled her eyes and stated, what am I supposed to be scared? Rn exited the room at that time. MD notified. Security notified.

## 2024-05-20 NOTE — ED Notes (Signed)
 Pt d/c with [facility]. Pt daughter called to inform pt is on the way back. Pt daughter expresses she has been waiting since 6pm for her to get back. Upon asking which door EMS needs to come through daughter hung up on this RN.

## 2024-05-20 NOTE — ED Triage Notes (Signed)
 Pt bib [facility] w/ c/o hematuria. Pt caregiver reported pt was recently treated for a UTI. This morning at diaper change daughter noticed a darker red color and wanted pt assessed. 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

## 2024-05-20 NOTE — ED Notes (Signed)
Pt given water. Denies any other needs at this time.

## 2024-06-14 NOTE — Progress Notes (Incomplete)
 Chief Complaint: Bloody urine  History of Present Illness:  Judy Boyd is a 63 y.o. female who is seen in consultation from Pcp, No for evaluation of ***.   Past Medical History:  Past Medical History:  Diagnosis Date   Anxiety    Arthritis    Asthma    Bowel obstruction (HCC)    Chronic pain    COPD (chronic obstructive pulmonary disease) (HCC)    Depression    GERD (gastroesophageal reflux disease)    Hypertension    Stroke [facility])     Past Surgical History:  Past Surgical History:  Procedure Laterality Date   ABDOMINAL SURGERY  2003   blockage   left salpingectomy     uterine ablation      Allergies:  Allergies  Allergen Reactions   Ibuprofen  Other (See Comments)    Unknown    Tramadol Other (See Comments)    unknown    Family History:  Family History  Problem Relation Age of Onset   Heart disease Mother    Heart disease Father     Social History:  Social History   Tobacco Use   Smoking status: Every Day    Current packs/day: 1.00    Average packs/day: 1 pack/day for 10.0 years (10.0 ttl pk-yrs)    Types: Cigarettes   Smokeless tobacco: Never  Vaping Use   Vaping status: Never Used  Substance Use Topics   Alcohol use: Yes    Comment: 2 beers per week   Drug use: No    Review of symptoms:  Constitutional:  Negative for unexplained weight loss, night sweats, fever, chills ENT:  Negative for nose bleeds, sinus pain, painful swallowing CV:  Negative for chest pain, shortness of breath, exercise intolerance, palpitations, loss of consciousness Resp:  Negative for cough, wheezing, shortness of breath GI:  Negative for nausea, vomiting, diarrhea, bloody stools GU:  Positives noted in HPI; otherwise negative for gross hematuria, dysuria, urinary incontinence Neuro:  Negative for seizures, poor balance, limb weakness, slurred speech Psych:  Negative for lack of energy, depression, anxiety Endocrine:  Negative for polydipsia, polyuria,  symptoms of hypoglycemia (dizziness, hunger, sweating) Hematologic:  Negative for anemia, purpura, petechia, prolonged or excessive bleeding, use of anticoagulants  Allergic:  Negative for difficulty breathing or choking as a result of exposure to anything; no shellfish allergy; no allergic response (rash/itch) to materials, foods  Physical exam: There were no vitals taken for this visit. GENERAL APPEARANCE:  Well appearing, well developed, well nourished, NAD HEENT: Atraumatic, Normocephalic. NECK: Normal appearance LUNGS: Normal inspiratory and expiratory excursion HEART: Regular Rate ABDOMEN: *** EXTREMITIES: Moves all extremities well.  Without clubbing, cyanosis, or edema. NEUROLOGIC:  Alert and oriented x 3, normal gait, CN II-XII grossly intact.  MENTAL STATUS:  Appropriate. SKIN:  Warm, dry and intact.    Results: No results found for this or any previous visit (from the past 24 hours).  I have reviewed prior patient's records  I have reviewed referring/prior physicians records  I have reviewed urinalysis  I have reviewed prior urine cultures  I reviewed prior imaging studies--CT images reviewed with family 1. 6 mm stone in the right renal pelvis with possible mild urothelial thickening of the right renal pelvis at the level of stone, correlate for upper UTI. No hydronephrosis or ureteral stone. 2. Small bilateral kidney stones. 3. Diverticular disease of the colon without acute wall thickening. 4. Equivocal appearance of the appendix which measures up to 8 mm  and appears slightly indistinct on axial views. Correlate for any clinical signs or symptoms of appendicitis. 5. Gallstones.    Assessment: No diagnosis found.   Plan: ***

## 2024-06-15 ENCOUNTER — Ambulatory Visit (HOSPITAL_BASED_OUTPATIENT_CLINIC_OR_DEPARTMENT_OTHER)
Admission: RE | Admit: 2024-06-15 | Discharge: 2024-06-15 | Disposition: A | Discharge status: Home / Self Care | Source: Ambulatory Visit | Attending: Urology | Admitting: Urology

## 2024-06-15 ENCOUNTER — Ambulatory Visit (INDEPENDENT_AMBULATORY_CARE_PROVIDER_SITE_OTHER): Admitting: Urology

## 2024-06-15 VITALS — BP 122/63 | HR 62

## 2024-06-15 DIAGNOSIS — N200 Calculus of kidney: Secondary | ICD-10-CM

## 2024-06-15 DIAGNOSIS — R310 Gross hematuria: Secondary | ICD-10-CM | POA: Diagnosis not present

## 2024-06-16 ENCOUNTER — Telehealth: Payer: Self-pay

## 2024-06-16 NOTE — Telephone Encounter (Signed)
 Attempted to contact pt daughter. Phone did not ring. Message stated enter your mailbox number. After waiting for 3x for the message to pass the line was disconnected. Will try again later.

## 2024-06-16 NOTE — Telephone Encounter (Signed)
-----   Message from Garnette HERO Dahlstedt sent at 06/15/2024  3:29 PM EDT ----- Please call the patient's daughter, Reshaundra--I think I can see the stone on plain x-ray.  So we can proceed eventually with the shockwave lithotripsy once we get the permission from her PCP to come off the Xarelto .  This can be done in [place].

## 2024-06-16 NOTE — Telephone Encounter (Signed)
 Pt daughter called back. Made daughter aware of Dr. Hallie note. Daughter stated she received permission yesterday to stop her mother's Xarelto . Daughter stated she is going to stop them next week so that her mother can have ESWL the following week. Reinforced with daughter not to stop any medications until she hears back from me regarding surgery and how long to stop blood thinners. Daughter became upset but verbally voiced understanding. Daughter provided PCP phone number.   Spoke with Scarlet at [employer]. Pt's PCP is [facility]. Scarlet also provided fax number. Clearance forms have been faxed.

## 2024-06-21 ENCOUNTER — Other Ambulatory Visit: Payer: Self-pay | Admitting: Urology

## 2024-06-21 ENCOUNTER — Ambulatory Visit: Payer: Self-pay

## 2024-06-21 DIAGNOSIS — N200 Calculus of kidney: Secondary | ICD-10-CM

## 2024-06-21 NOTE — Telephone Encounter (Signed)
-----   Message from Garnette HERO Dahlstedt sent at 06/17/2024  7:44 AM EDT ----- Please call  pt's daughter--I can see the stone on her xray--if they would like me to start scheduling her for ESL I can do that in [place] ----- Message ----- From: Interface, Rad Results In Sent: 06/16/2024  10:58 PM EDT To: Garnette Shack, MD

## 2024-06-21 NOTE — Telephone Encounter (Signed)
 Spoke with pt daughter. She wishes to have ESWL at [facility].

## 2024-06-24 ENCOUNTER — Encounter: Payer: Self-pay | Admitting: *Deleted

## 2024-07-06 ENCOUNTER — Encounter (HOSPITAL_COMMUNITY): Payer: Self-pay | Admitting: Urology

## 2024-07-07 NOTE — Progress Notes (Addendum)
 LITHO PREOP PHONE CALL   ALLERGIES REVIEWED: YES  MEDICATION REVIEW DONE: YES MEDICATIONS THAT PT SHOULD HOLD (LIST): Plavix  hold 5 days last dose 11/18, ASA hold 72hr , NSAIDS hold 48hr  CAN PT WALK UP STAIRS WITHOUT SHORTNESS OF BREATH: No bedbound HOME O2: NO CPAP: NO  IF YES, INFORMED PT TO BRING CPAP WITH TUBING AND MASK:YES/NO   INFORMED DRIVER NEEDED FOR PROCEDURE: YES  PT WAS GIVEN BLUE FOLDER AT UROLOGY APPT: NO- notified Chelsea with Urology via epic chat 11/20- will fill out day of procedure PT INFORMED TO BRING BLUE FOLDER WITH ALL CONTENTS:n/a  REVIEWED ARRIVAL TIME AND LOCATION: YES  OTHER PERTINENT INFORMATION: Told patients daughter arrival by 0845 she states EMS is set up to arrive to pick her up at 0830 and she wasn't going to change that, so patient may arrive little after 0845.

## 2024-07-10 NOTE — H&P (Signed)
 H&P  Chief Complaint: Rt kidney stone w/ hematuria  History of Present Illness: This 63 yo female presents for ESL of a 6 mm Rt renal pelvic stone.   Past Medical History:  Diagnosis Date   Anxiety    Arthritis    Asthma    Bowel obstruction (HCC)    Chronic pain    COPD (chronic obstructive pulmonary disease) (HCC)    Depression    GERD (gastroesophageal reflux disease)    Hypertension    Stroke [facility])     Past Surgical History:  Procedure Laterality Date   ABDOMINAL SURGERY  2003   blockage   left salpingectomy     uterine ablation      Home Medications:  No medications prior to admission.    Allergies:  Allergies  Allergen Reactions   Ibuprofen  Other (See Comments)    Unknown    Tramadol Other (See Comments)    unknown    Family History  Problem Relation Age of Onset   Heart disease Mother    Heart disease Father     Social History:  reports that she has been smoking cigarettes. She has a 10 pack-year smoking history. She has never used smokeless tobacco. She reports current alcohol use. She reports that she does not use drugs.  ROS: A complete review of systems was performed.  All systems are negative except for pertinent findings as noted.  Physical Exam:  Vital signs in last 24 hours:   General:  Alert and oriented, No acute distress HEENT: Normocephalic, atraumatic Neck: No JVD or lymphadenopathy Cardiovascular: Regular rate  Lungs: Normal inspiratory/expiratory excursion Abdomen: Soft, nontender, nondistended, no abdominal masses Back: No CVA tenderness Extremities: No edema Neurologic: Grossly intact  I have reviewed prior pt notes  I have reviewed notes from referring/previous physicians  I have reviewed urinalysis results  I have independently reviewed prior imaging  I have reviewed prior urine culture   Impression/Assessment:  ***  Plan:  ***  Garnette HERO Shayli Altemose 07/10/2024, 9:25 PM  Garnette HERO. Advik Weatherspoon MD

## 2024-07-11 ENCOUNTER — Encounter (HOSPITAL_COMMUNITY)
Admission: RE | Disposition: A | Discharge status: Home / Self Care | Payer: Self-pay | Source: Ambulatory Visit | Attending: Urology

## 2024-07-11 ENCOUNTER — Encounter (HOSPITAL_COMMUNITY): Payer: Self-pay | Admitting: Urology

## 2024-07-11 ENCOUNTER — Ambulatory Visit (HOSPITAL_COMMUNITY)
Admission: RE | Admit: 2024-07-11 | Discharge: 2024-07-11 | Disposition: A | Discharge status: Home / Self Care | Source: Ambulatory Visit | Attending: Urology | Admitting: Urology

## 2024-07-11 ENCOUNTER — Ambulatory Visit (HOSPITAL_COMMUNITY): Payer: Self-pay | Admitting: Certified Registered Nurse Anesthetist

## 2024-07-11 ENCOUNTER — Other Ambulatory Visit: Payer: Self-pay

## 2024-07-11 ENCOUNTER — Ambulatory Visit (HOSPITAL_COMMUNITY)

## 2024-07-11 DIAGNOSIS — J4489 Other specified chronic obstructive pulmonary disease: Secondary | ICD-10-CM | POA: Diagnosis not present

## 2024-07-11 DIAGNOSIS — I10 Essential (primary) hypertension: Secondary | ICD-10-CM | POA: Diagnosis not present

## 2024-07-11 DIAGNOSIS — Z8249 Family history of ischemic heart disease and other diseases of the circulatory system: Secondary | ICD-10-CM | POA: Diagnosis not present

## 2024-07-11 DIAGNOSIS — E669 Obesity, unspecified: Secondary | ICD-10-CM | POA: Insufficient documentation

## 2024-07-11 DIAGNOSIS — N200 Calculus of kidney: Secondary | ICD-10-CM | POA: Diagnosis present

## 2024-07-11 DIAGNOSIS — I69359 Hemiplegia and hemiparesis following cerebral infarction affecting unspecified side: Secondary | ICD-10-CM | POA: Diagnosis not present

## 2024-07-11 DIAGNOSIS — N179 Acute kidney failure, unspecified: Secondary | ICD-10-CM

## 2024-07-11 DIAGNOSIS — M1990 Unspecified osteoarthritis, unspecified site: Secondary | ICD-10-CM | POA: Diagnosis not present

## 2024-07-11 DIAGNOSIS — Z6828 Body mass index (BMI) 28.0-28.9, adult: Secondary | ICD-10-CM | POA: Insufficient documentation

## 2024-07-11 HISTORY — PX: EXTRACORPOREAL SHOCK WAVE LITHOTRIPSY: SHX1557

## 2024-07-11 SURGERY — LITHOTRIPSY, ESWL
Anesthesia: Monitor Anesthesia Care | Laterality: Right

## 2024-07-11 MED ORDER — PROPOFOL 1000 MG/100ML IV EMUL
INTRAVENOUS | Status: AC
  Filled 2024-07-11: qty 100

## 2024-07-11 MED ORDER — PROPOFOL 10 MG/ML IV BOLUS
INTRAVENOUS | Status: AC
  Filled 2024-07-11: qty 20

## 2024-07-11 MED ORDER — LIDOCAINE HCL (PF) 2 % IJ SOLN
INTRAMUSCULAR | Status: AC
  Filled 2024-07-11: qty 5

## 2024-07-11 MED ORDER — SODIUM CHLORIDE 0.9 % IV SOLN: INTRAVENOUS | Status: DC

## 2024-07-11 MED ORDER — MIDAZOLAM HCL 2 MG/2ML IJ SOLN
INTRAMUSCULAR | Status: AC
  Filled 2024-07-11: qty 2

## 2024-07-11 MED ORDER — ONDANSETRON HCL 4 MG/2ML IJ SOLN
INTRAMUSCULAR | Status: AC
  Filled 2024-07-11: qty 2

## 2024-07-11 MED ORDER — KETOROLAC TROMETHAMINE 30 MG/ML IJ SOLN
INTRAMUSCULAR | Status: AC
  Filled 2024-07-11: qty 1

## 2024-07-11 MED ORDER — FENTANYL CITRATE (PF) 100 MCG/2ML IJ SOLN
INTRAMUSCULAR | Status: AC
  Filled 2024-07-11: qty 2

## 2024-07-11 SURGICAL SUPPLY — 4 items
BAG COUNTER SPONGE SURGICOUNT (BAG) IMPLANT
COVER SURGICAL LIGHT HANDLE (MISCELLANEOUS) ×1 IMPLANT
PENCIL SMOKE EVACUATOR (MISCELLANEOUS) IMPLANT
TOWEL OR DSP ST BLU DLX 10/PK (DISPOSABLE) ×1 IMPLANT

## 2024-07-11 NOTE — Anesthesia Preprocedure Evaluation (Addendum)
 Anesthesia Evaluation  Patient identified by MRN, date of birth, ID band Patient awake    Reviewed: Allergy & Precautions, NPO status , Patient's Chart, lab work & pertinent test results  History of Anesthesia Complications Negative for: history of anesthetic complications  Airway Mallampati: II  TM Distance: >3 FB Neck ROM: Full    Dental no notable dental hx. (+) Teeth Intact, Dental Advisory Given   Pulmonary COPD, Current Smoker   Pulmonary exam normal breath sounds clear to auscultation       Cardiovascular hypertension, Pt. on medications Normal cardiovascular exam Rhythm:Regular Rate:Normal     Neuro/Psych   Anxiety     CVA (R hemiparesis), Residual Symptoms    GI/Hepatic ,neg GERD  ,,  Endo/Other    Renal/GU Renal diseaseLab Results      Component                Value               Date                      K                        3.0 (L)             05/20/2024                CO2                      29                  05/20/2024                BUN                      9                   05/20/2024                CREATININE               0.84                05/20/2024                GFRNONAA                 >60                 05/20/2024                     Musculoskeletal  (+) Arthritis ,    Abdominal   Peds  Hematology Lab Results      Component                Value               Date                      WBC                      10.2                05/20/2024                HGB  11.4 (L)            05/20/2024                HCT                      37.5                05/20/2024                MCV                      89.9                05/20/2024                PLT                      260                 05/20/2024              Anesthesia Other Findings   Reproductive/Obstetrics                              Anesthesia Physical Anesthesia  Plan  ASA: 3  Anesthesia Plan: MAC   Post-op Pain Management: Ofirmev  IV (intra-op)*   Induction: Intravenous  PONV Risk Score and Plan: 2 and Propofol  infusion, Treatment may vary due to age or medical condition and Ondansetron   Airway Management Planned: Natural Airway and Nasal Cannula  Additional Equipment: None  Intra-op Plan:   Post-operative Plan:   Informed Consent: I have reviewed the patients History and Physical, chart, labs and discussed the procedure including the risks, benefits and alternatives for the proposed anesthesia with the patient or authorized representative who has indicated his/her understanding and acceptance.     Dental advisory given  Plan Discussed with: CRNA and Surgeon  Anesthesia Plan Comments:          Anesthesia Quick Evaluation

## 2024-07-11 NOTE — Op Note (Signed)
See [facility] OP note scanned into chart. 

## 2024-07-11 NOTE — Discharge Instructions (Addendum)
 See [facility] discharge instructions in chart.

## 2024-07-12 ENCOUNTER — Encounter (HOSPITAL_COMMUNITY): Payer: Self-pay | Admitting: Urology

## 2024-09-27 ENCOUNTER — Ambulatory Visit
